# Patient Record
Sex: Male | Born: 1955
Health system: Southern US, Community
[De-identification: ages and names within clinical notes are randomized; demographics above are authoritative.]

## PROBLEM LIST (undated history)

## (undated) DIAGNOSIS — G43909 Migraine, unspecified, not intractable, without status migrainosus: Secondary | ICD-10-CM

## (undated) DIAGNOSIS — R569 Unspecified convulsions: Secondary | ICD-10-CM

## (undated) DIAGNOSIS — F1910 Other psychoactive substance abuse, uncomplicated: Secondary | ICD-10-CM

## (undated) DIAGNOSIS — I10 Essential (primary) hypertension: Secondary | ICD-10-CM

## (undated) DIAGNOSIS — F419 Anxiety disorder, unspecified: Secondary | ICD-10-CM

## (undated) DIAGNOSIS — F3162 Bipolar disorder, current episode mixed, moderate: Secondary | ICD-10-CM

## (undated) DIAGNOSIS — F410 Panic disorder [episodic paroxysmal anxiety] without agoraphobia: Secondary | ICD-10-CM

## (undated) DIAGNOSIS — G4733 Obstructive sleep apnea (adult) (pediatric): Secondary | ICD-10-CM

## (undated) DIAGNOSIS — K219 Gastro-esophageal reflux disease without esophagitis: Secondary | ICD-10-CM

## (undated) HISTORY — DX: Other psychoactive substance abuse, uncomplicated: F19.10

## (undated) HISTORY — PX: CARPOMETACARPEL (CMC) FUSION OF THUMB WITH AUTOGRAFT FROM RADIUS: SHX5767

## (undated) HISTORY — PX: WISDOM TOOTH EXTRACTION: SHX21

## (undated) HISTORY — DX: Panic disorder (episodic paroxysmal anxiety): F41.0

## (undated) HISTORY — DX: Anxiety disorder, unspecified: F41.9

## (undated) HISTORY — DX: Gastro-esophageal reflux disease without esophagitis: K21.9

## (undated) HISTORY — PX: TONSILLECTOMY: SUR1361

## (undated) HISTORY — DX: Migraine, unspecified, not intractable, without status migrainosus: G43.909

## (undated) HISTORY — DX: Unspecified convulsions: R56.9

## (undated) HISTORY — DX: Obstructive sleep apnea (adult) (pediatric): G47.33

## (undated) HISTORY — DX: Bipolar disorder, current episode mixed, moderate: F31.62

## (undated) HISTORY — DX: Essential (primary) hypertension: I10

---

## 1990-05-01 HISTORY — PX: HEMORRHOID SURGERY: SHX153

## 1998-05-01 HISTORY — PX: HEMORRHOID SURGERY: SHX153

## 2002-04-23 ENCOUNTER — Emergency Department (HOSPITAL_COMMUNITY): Admission: EM | Admit: 2002-04-23 | Discharge: 2002-04-23 | Payer: Self-pay | Admitting: Emergency Medicine

## 2003-06-19 ENCOUNTER — Emergency Department (HOSPITAL_COMMUNITY): Admission: EM | Admit: 2003-06-19 | Discharge: 2003-06-19 | Payer: Self-pay | Admitting: Family Medicine

## 2005-02-28 ENCOUNTER — Ambulatory Visit: Payer: Self-pay | Admitting: Internal Medicine

## 2005-03-30 ENCOUNTER — Ambulatory Visit: Payer: Self-pay | Admitting: Cardiology

## 2005-03-30 IMAGING — CT CT CHEST W/O CM
2 of 5 series · 12 of 36 positions shown, 15 images · IV contrast (agent unspecified)
Comparison: Chest x-ray of [DATE] was reviewed showing an oval soft tissue opacity overlying the right upper heart border.

CLINICAL DATA: Right lower lobe opacity on chest x-ray.
 CHEST CT WITHOUT CONTRAST:
TECHNIQUE: Multidetector CT imaging of the chest was performed following the standard protocol without IV contrast.

[Series 2: chest_hi_res 5.0 b40f st · axial · 0.81mm/px · z∈[-272,-52]mm · 9 of 56 slices shown, 12 images]
[im 6/56  mediastinal]
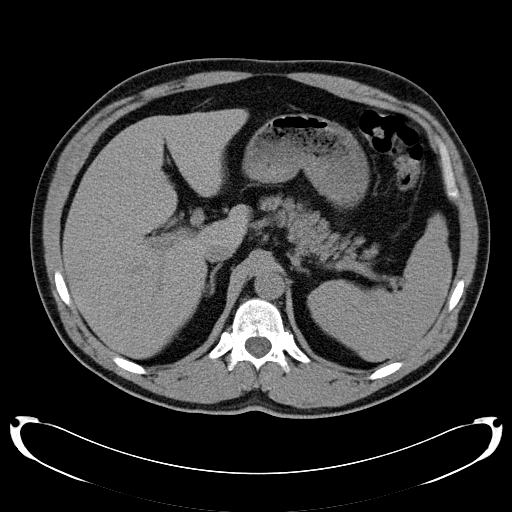
[im 6/56  lung]
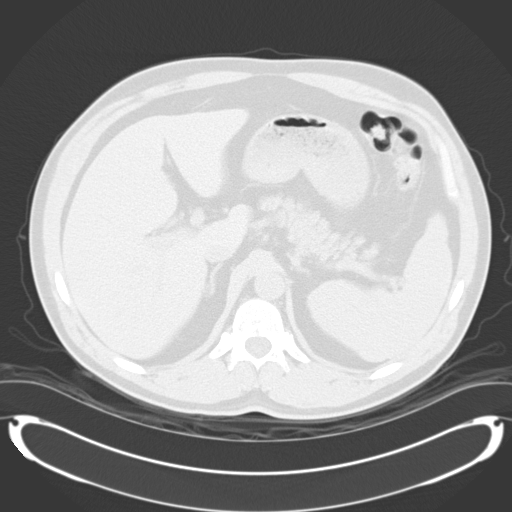
[im 12/56  lung]
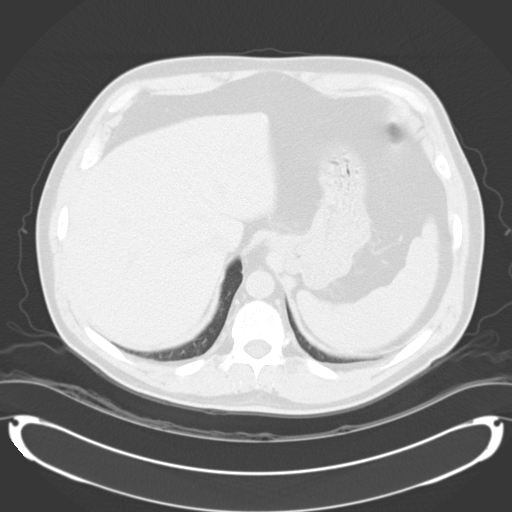
[im 17/56  lung]
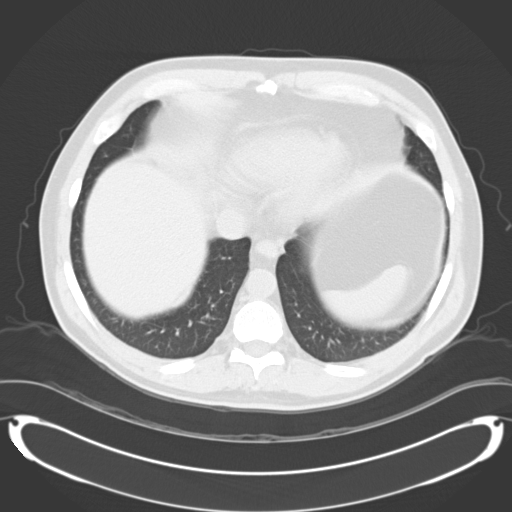
[im 23/56  lung]
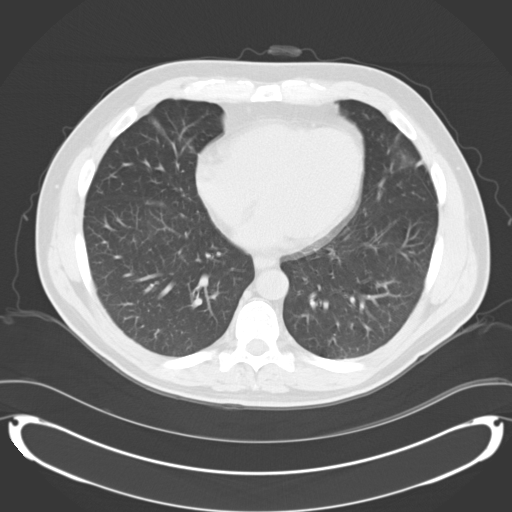
[im 28/56  mediastinal]
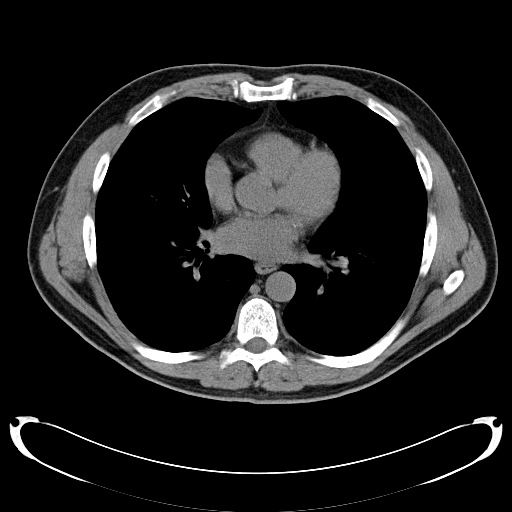
[im 28/56  lung]
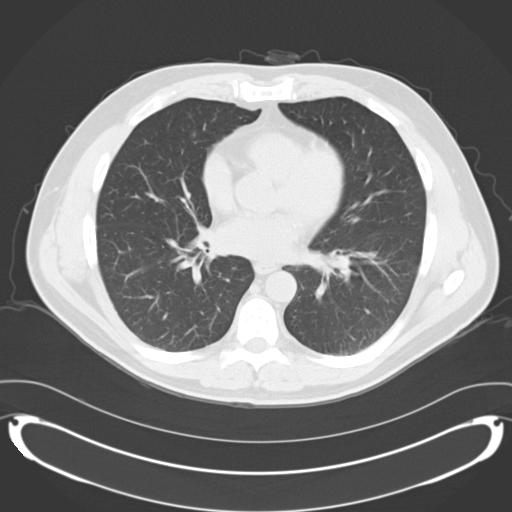
[im 34/56  lung]
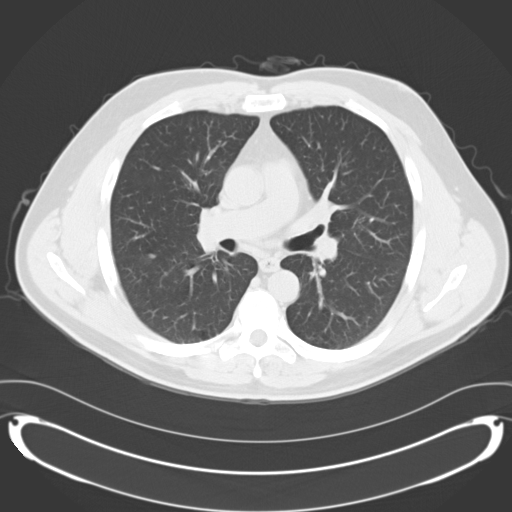
[im 39/56  lung]
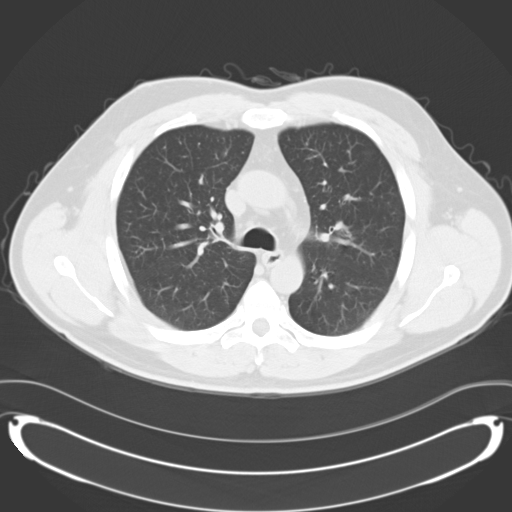
[im 45/56  lung]
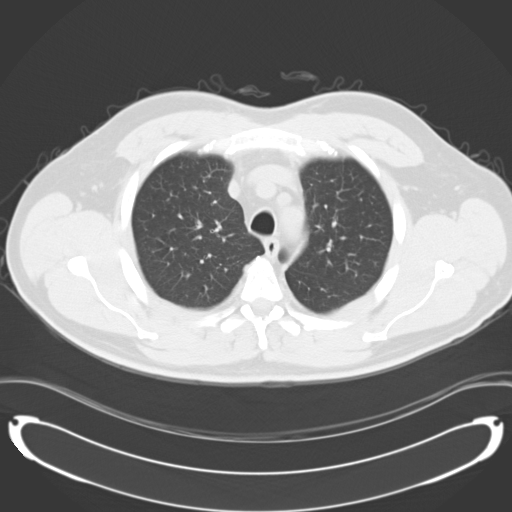
[im 50/56  mediastinal]
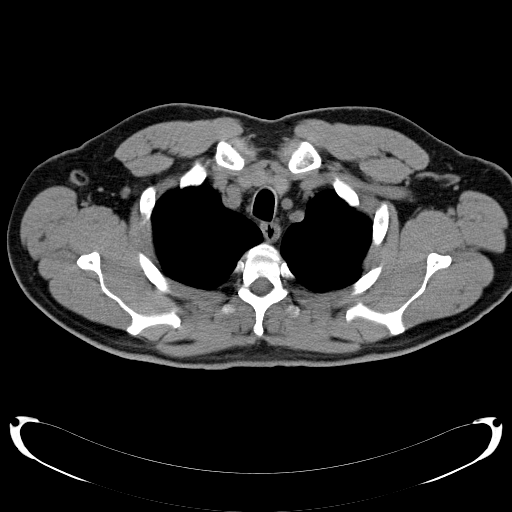
[im 50/56  lung]
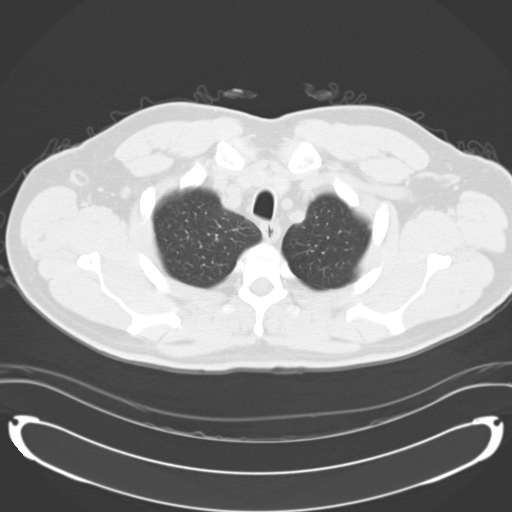

[Series 602: <mpr range> · coronal · 0.81mm/px · 3 of 39 slices shown]
[im 8/39  lung]
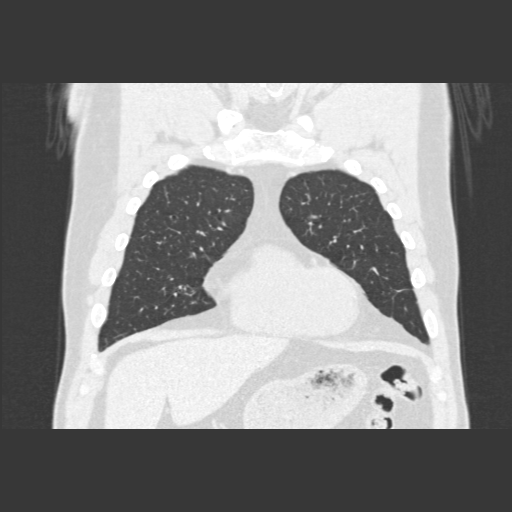
[im 16/39  lung]
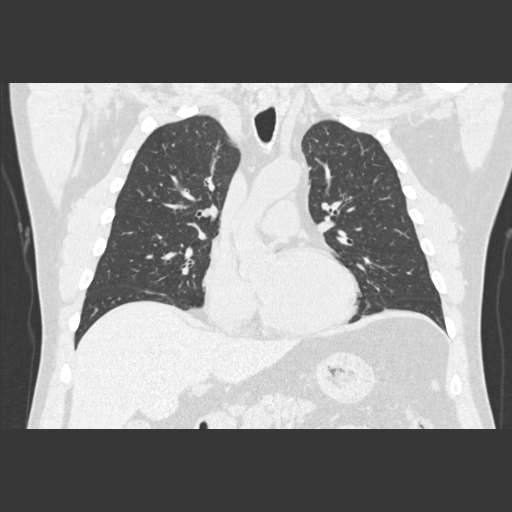
[im 23/39  lung]
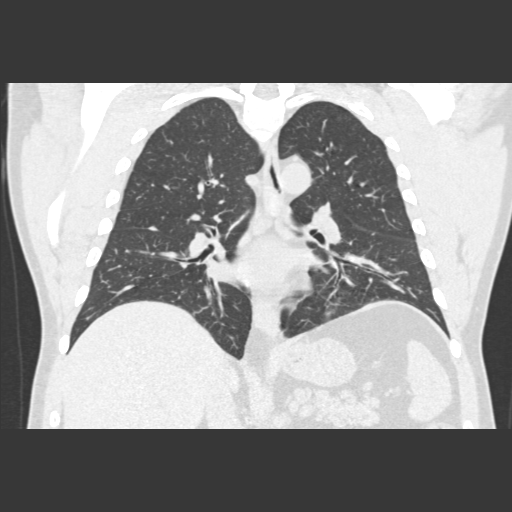

[12 of 36 positions shown; findings below may reference images not displayed]

FINDINGS: This opacity may represent a prominent right atrial appendage, but no pulmonary lesion is seen. A small nodular opacity is noted adjacent to the fissure on the right mid lung on image #23, most likely benign.  Limited CT follow-up in six months may be helpful to assess further.  Minimal scarring is noted in the right middle lobe and within the lingula.  No pleural effusion is seen.  No mediastinal or hilar adenopathy is seen.  A few coronary artery calcifications noted.
IMPRESSION: 1.  The area in question probably represents right atrial appendage.  No pulmonary lesion is seen.
 2.  Faint coronary artery calcifications.

## 2005-04-06 ENCOUNTER — Ambulatory Visit: Payer: Self-pay | Admitting: Internal Medicine

## 2005-04-14 ENCOUNTER — Ambulatory Visit: Payer: Self-pay

## 2005-08-11 ENCOUNTER — Ambulatory Visit: Payer: Self-pay | Admitting: Internal Medicine

## 2006-01-08 ENCOUNTER — Ambulatory Visit: Payer: Self-pay | Admitting: Internal Medicine

## 2006-02-19 ENCOUNTER — Ambulatory Visit: Payer: Self-pay | Admitting: Internal Medicine

## 2006-02-19 LAB — CONVERTED CEMR LAB
Magnesium: 2 mg/dL (ref 1.5–2.5)
Potassium: 4.6 meq/L (ref 3.5–5.1)
Sodium: 140 meq/L (ref 135–145)

## 2006-03-30 ENCOUNTER — Ambulatory Visit: Payer: Self-pay | Admitting: Internal Medicine

## 2006-05-11 ENCOUNTER — Ambulatory Visit: Payer: Self-pay | Admitting: Internal Medicine

## 2006-05-11 LAB — CONVERTED CEMR LAB
ALT: 22 units/L (ref 0–40)
AST: 23 units/L (ref 0–37)
Albumin: 4.4 g/dL (ref 3.5–5.2)
Alkaline Phosphatase: 58 units/L (ref 39–117)
BUN: 24 mg/dL — ABNORMAL HIGH (ref 6–23)
CO2: 26 meq/L (ref 19–32)
Calcium: 9.6 mg/dL (ref 8.4–10.5)
Chloride: 109 meq/L (ref 96–112)
Creatinine, Ser: 1.5 mg/dL (ref 0.4–1.5)
GFR calc non Af Amer: 53 mL/min
Glomerular Filtration Rate, Af Am: 64 mL/min/{1.73_m2}
Glucose, Bld: 144 mg/dL — ABNORMAL HIGH (ref 70–99)
HCT: 49.2 % (ref 39.0–52.0)
Hemoglobin: 16.6 g/dL (ref 13.0–17.0)
MCHC: 33.8 g/dL (ref 30.0–36.0)
MCV: 91 fL (ref 78.0–100.0)
Platelets: 266 10*3/uL (ref 150–400)
Potassium: 3.6 meq/L (ref 3.5–5.1)
RBC: 5.41 M/uL (ref 4.22–5.81)
RDW: 12 % (ref 11.5–14.6)
Sodium: 144 meq/L (ref 135–145)
TSH: 1.07 microintl units/mL (ref 0.35–5.50)
Total Bilirubin: 1.1 mg/dL (ref 0.3–1.2)
Total Protein: 6.8 g/dL (ref 6.0–8.3)
WBC: 13.2 10*3/uL — ABNORMAL HIGH (ref 4.5–10.5)

## 2006-05-14 ENCOUNTER — Ambulatory Visit: Payer: Self-pay | Admitting: Internal Medicine

## 2006-11-22 ENCOUNTER — Ambulatory Visit: Payer: Self-pay | Admitting: Internal Medicine

## 2007-02-12 ENCOUNTER — Encounter: Payer: Self-pay | Admitting: Internal Medicine

## 2007-02-16 ENCOUNTER — Encounter: Payer: Self-pay | Admitting: Internal Medicine

## 2007-02-16 DIAGNOSIS — I10 Essential (primary) hypertension: Secondary | ICD-10-CM | POA: Insufficient documentation

## 2007-02-18 ENCOUNTER — Ambulatory Visit: Payer: Self-pay | Admitting: Internal Medicine

## 2007-02-18 DIAGNOSIS — J069 Acute upper respiratory infection, unspecified: Secondary | ICD-10-CM | POA: Insufficient documentation

## 2007-02-18 DIAGNOSIS — K5900 Constipation, unspecified: Secondary | ICD-10-CM | POA: Insufficient documentation

## 2007-02-19 ENCOUNTER — Encounter: Payer: Self-pay | Admitting: Internal Medicine

## 2007-02-21 ENCOUNTER — Encounter: Payer: Self-pay | Admitting: Internal Medicine

## 2007-02-21 ENCOUNTER — Ambulatory Visit: Payer: Self-pay | Admitting: Internal Medicine

## 2007-02-21 DIAGNOSIS — G43819 Other migraine, intractable, without status migrainosus: Secondary | ICD-10-CM

## 2007-02-21 DIAGNOSIS — R112 Nausea with vomiting, unspecified: Secondary | ICD-10-CM | POA: Insufficient documentation

## 2007-02-21 DIAGNOSIS — F319 Bipolar disorder, unspecified: Secondary | ICD-10-CM | POA: Insufficient documentation

## 2007-02-21 DIAGNOSIS — G43119 Migraine with aura, intractable, without status migrainosus: Secondary | ICD-10-CM | POA: Insufficient documentation

## 2007-03-26 ENCOUNTER — Encounter: Payer: Self-pay | Admitting: Internal Medicine

## 2007-04-21 ENCOUNTER — Telehealth: Payer: Self-pay | Admitting: Internal Medicine

## 2007-04-21 ENCOUNTER — Encounter: Payer: Self-pay | Admitting: Internal Medicine

## 2007-04-21 ENCOUNTER — Emergency Department (HOSPITAL_COMMUNITY): Admission: EM | Admit: 2007-04-21 | Discharge: 2007-04-21 | Payer: Self-pay | Admitting: Emergency Medicine

## 2007-04-21 IMAGING — CR DG CHEST 2V
2 series · 2 of 2 positions shown · non-contrast
Comparison: CT chest of [DATE].

CLINICAL DATA: Dizziness.  Hypotension.  Dyspnea. 
 CHEST ? 2 VIEW:

[w chest pa]
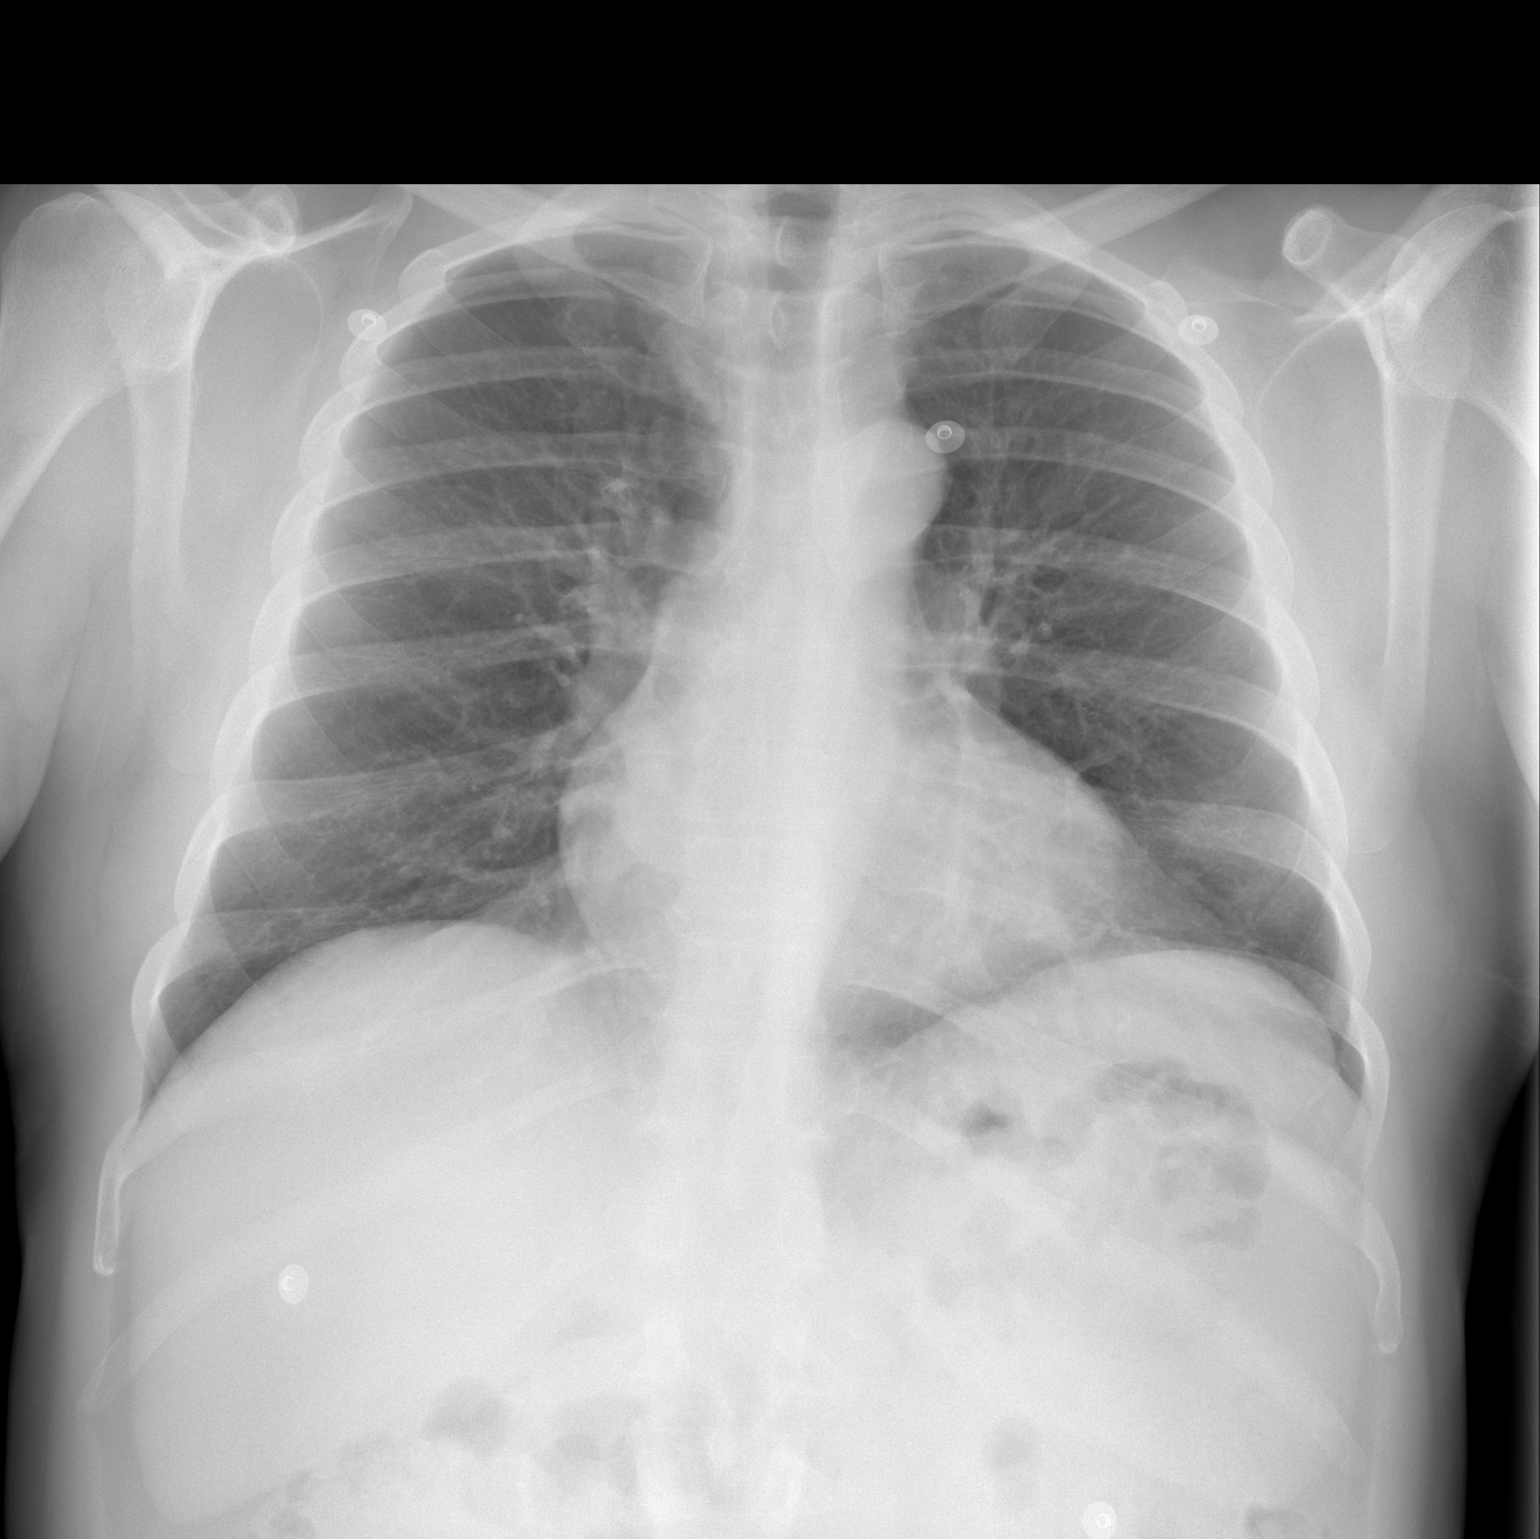

[w chest lat]
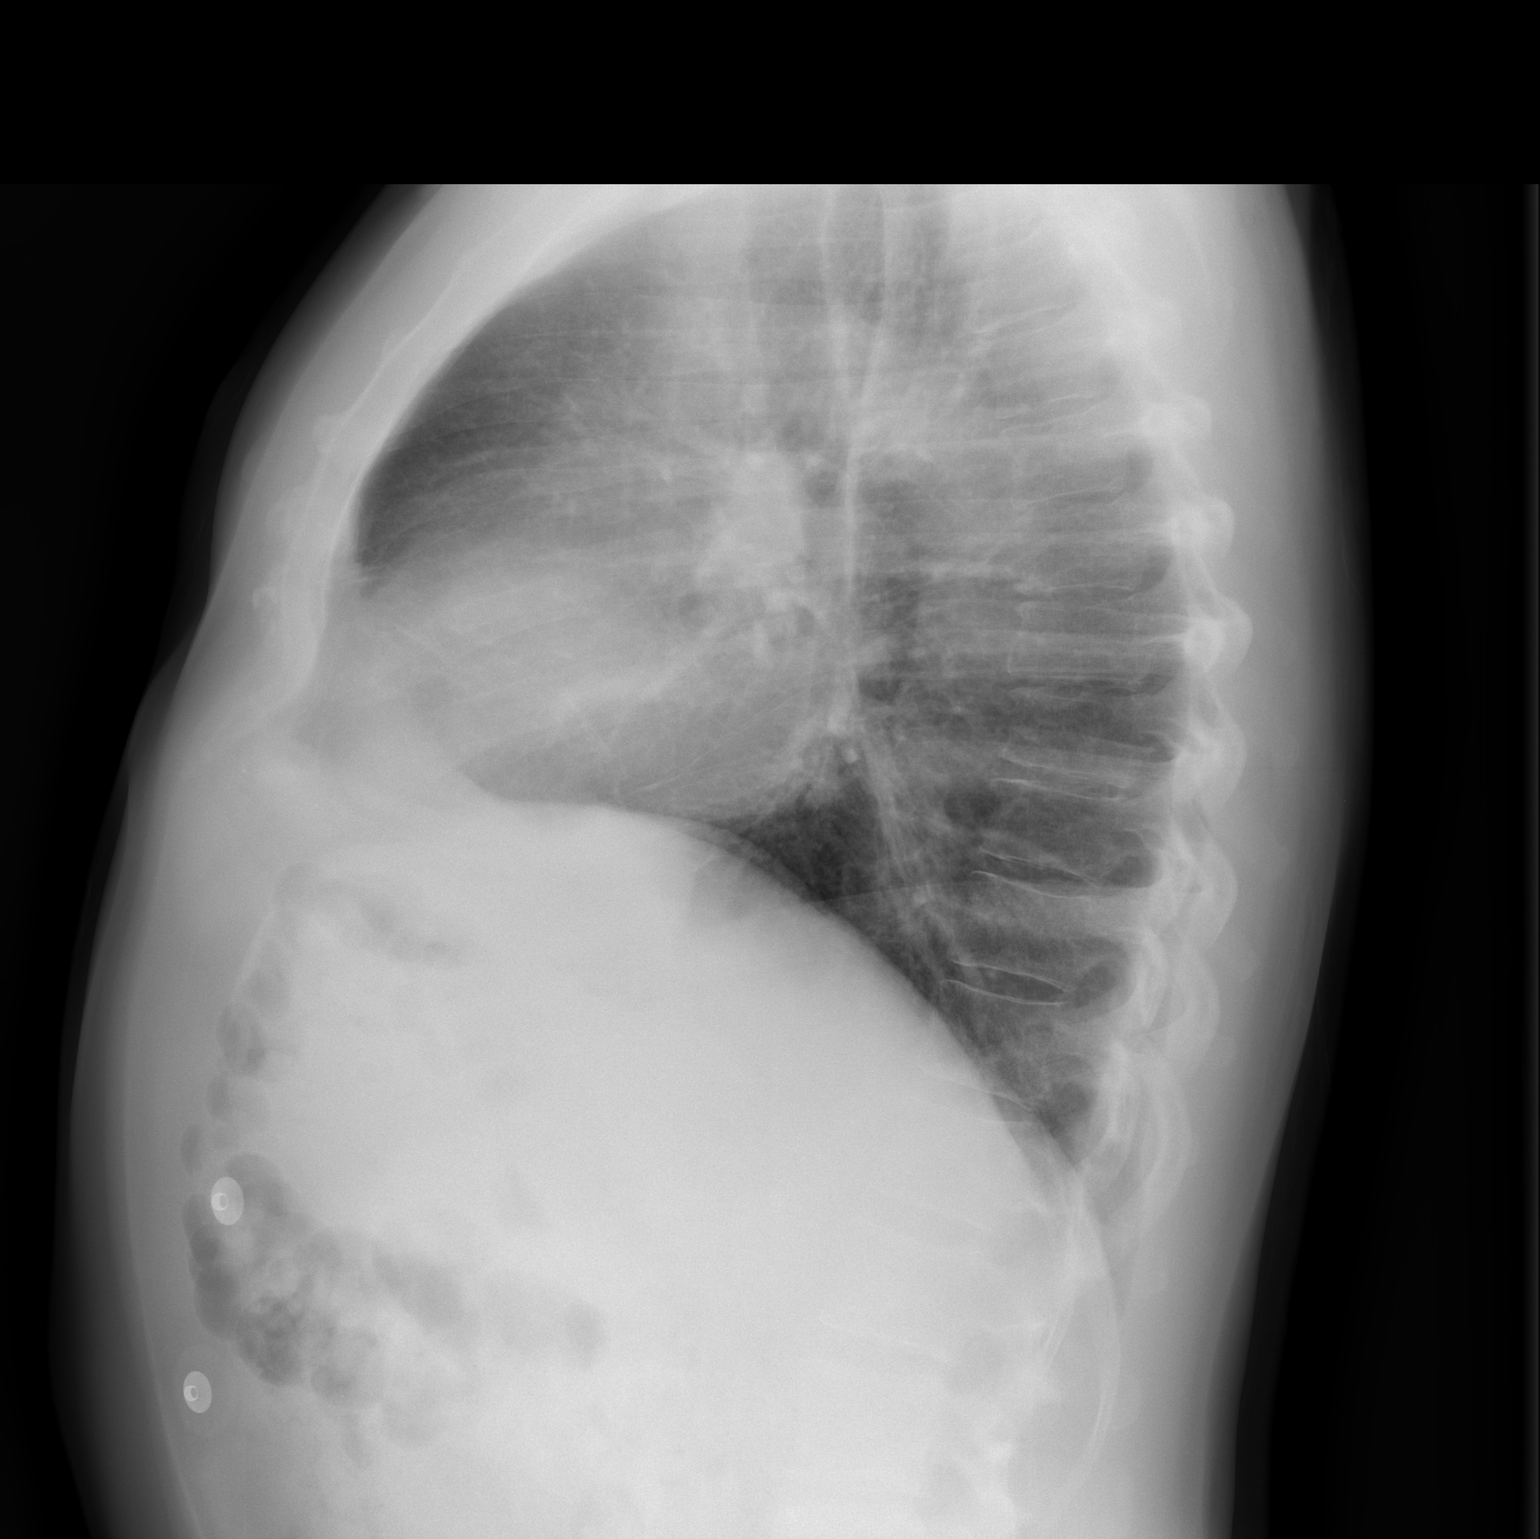

[2 of 2 positions shown; findings below may reference images not displayed]

FINDINGS: The lungs are clear.  The heart is normal.  The mediastinum and hilar are negative for adenopathy.  Mild degenerative changes of the thoracic spine.
IMPRESSION: Negative for acute cardiac or pulmonary process.

## 2007-04-22 ENCOUNTER — Ambulatory Visit: Payer: Self-pay | Admitting: Internal Medicine

## 2007-04-22 ENCOUNTER — Telehealth: Payer: Self-pay | Admitting: Internal Medicine

## 2007-04-22 DIAGNOSIS — R079 Chest pain, unspecified: Secondary | ICD-10-CM | POA: Insufficient documentation

## 2007-04-22 DIAGNOSIS — F411 Generalized anxiety disorder: Secondary | ICD-10-CM | POA: Insufficient documentation

## 2007-04-22 DIAGNOSIS — R42 Dizziness and giddiness: Secondary | ICD-10-CM | POA: Insufficient documentation

## 2007-04-22 DIAGNOSIS — F410 Panic disorder [episodic paroxysmal anxiety] without agoraphobia: Secondary | ICD-10-CM | POA: Insufficient documentation

## 2007-05-06 ENCOUNTER — Ambulatory Visit: Payer: Self-pay

## 2007-05-06 ENCOUNTER — Encounter: Payer: Self-pay | Admitting: Internal Medicine

## 2007-05-21 ENCOUNTER — Encounter: Payer: Self-pay | Admitting: Internal Medicine

## 2007-05-28 ENCOUNTER — Ambulatory Visit: Payer: Self-pay | Admitting: Internal Medicine

## 2007-05-28 LAB — CONVERTED CEMR LAB
ALT: 25 units/L (ref 0–53)
AST: 18 units/L (ref 0–37)
Albumin: 4.1 g/dL (ref 3.5–5.2)
Alkaline Phosphatase: 47 units/L (ref 39–117)
Bilirubin, Direct: 0.2 mg/dL (ref 0.0–0.3)
CO2: 28 meq/L (ref 19–32)
Chloride: 110 meq/L (ref 96–112)
Potassium: 4.8 meq/L (ref 3.5–5.1)
Sodium: 143 meq/L (ref 135–145)
Total Bilirubin: 1 mg/dL (ref 0.3–1.2)
Total Protein: 6.5 g/dL (ref 6.0–8.3)

## 2007-05-31 ENCOUNTER — Telehealth: Payer: Self-pay | Admitting: Internal Medicine

## 2007-05-31 LAB — CONVERTED CEMR LAB: Lithium Lvl: 0.6 meq/L — ABNORMAL LOW (ref 0.80–1.40)

## 2007-07-16 ENCOUNTER — Encounter: Payer: Self-pay | Admitting: Internal Medicine

## 2007-08-26 ENCOUNTER — Ambulatory Visit: Payer: Self-pay | Admitting: Internal Medicine

## 2007-11-19 ENCOUNTER — Encounter: Payer: Self-pay | Admitting: Internal Medicine

## 2007-11-25 ENCOUNTER — Ambulatory Visit: Payer: Self-pay | Admitting: Internal Medicine

## 2007-11-25 LAB — CONVERTED CEMR LAB
ALT: 14 units/L (ref 0–53)
AST: 17 units/L (ref 0–37)
Albumin: 4.3 g/dL (ref 3.5–5.2)
Alkaline Phosphatase: 50 units/L (ref 39–117)
BUN: 11 mg/dL (ref 6–23)
Basophils Absolute: 0.1 10*3/uL (ref 0.0–0.1)
Basophils Relative: 0.9 % (ref 0.0–3.0)
Bilirubin Urine: NEGATIVE
Bilirubin, Direct: 0.2 mg/dL (ref 0.0–0.3)
CO2: 26 meq/L (ref 19–32)
Calcium: 9.8 mg/dL (ref 8.4–10.5)
Chloride: 109 meq/L (ref 96–112)
Cholesterol: 107 mg/dL (ref 0–200)
Creatinine, Ser: 1.2 mg/dL (ref 0.4–1.5)
Eosinophils Absolute: 0.3 10*3/uL (ref 0.0–0.7)
Eosinophils Relative: 3.7 % (ref 0.0–5.0)
Free T4: 0.9 ng/dL (ref 0.6–1.6)
GFR calc Af Amer: 82 mL/min
GFR calc non Af Amer: 68 mL/min
Glucose, Bld: 96 mg/dL (ref 70–99)
HCT: 42.5 % (ref 39.0–52.0)
HDL: 26.6 mg/dL — ABNORMAL LOW (ref >39.0)
Hemoglobin, Urine: NEGATIVE
Hemoglobin: 14.5 g/dL (ref 13.0–17.0)
LDL Cholesterol: 61 mg/dL (ref 0–99)
Leukocytes, UA: NEGATIVE
Lymphocytes Relative: 22.5 % (ref 12.0–46.0)
MCHC: 34.2 g/dL (ref 30.0–36.0)
MCV: 92.6 fL (ref 78.0–100.0)
Monocytes Absolute: 0.7 10*3/uL (ref 0.1–1.0)
Monocytes Relative: 7.8 % (ref 3.0–12.0)
Neutro Abs: 5.6 10*3/uL (ref 1.4–7.7)
Neutrophils Relative %: 65.1 % (ref 43.0–77.0)
Nitrite: NEGATIVE
Platelets: 224 10*3/uL (ref 150–400)
Potassium: 3.6 meq/L (ref 3.5–5.1)
RBC: 4.59 M/uL (ref 4.22–5.81)
RDW: 12.5 % (ref 11.5–14.6)
Sodium: 142 meq/L (ref 135–145)
Specific Gravity, Urine: 1.02 (ref 1.000–1.03)
TSH: 1.74 microintl units/mL (ref 0.35–5.50)
Total Bilirubin: 0.9 mg/dL (ref 0.3–1.2)
Total CHOL/HDL Ratio: 4
Total Protein, Urine: NEGATIVE mg/dL
Total Protein: 6.6 g/dL (ref 6.0–8.3)
Triglycerides: 96 mg/dL (ref 0–149)
Urine Glucose: NEGATIVE mg/dL
Urobilinogen, UA: 1 (ref 0.0–1.0)
VLDL: 19 mg/dL (ref 0–40)
WBC: 8.6 10*3/uL (ref 4.5–10.5)
pH: 6.5 (ref 5.0–8.0)

## 2007-11-26 ENCOUNTER — Ambulatory Visit: Payer: Self-pay | Admitting: Internal Medicine

## 2007-12-02 LAB — CONVERTED CEMR LAB
Amphetamine Screen, Ur: NEGATIVE
Barbiturate Quant, Ur: NEGATIVE
Benzodiazepines.: NEGATIVE
Cocaine Metabolites: POSITIVE — AB
Creatinine,U: 188.1 mg/dL
Lithium Lvl: 1.42 meq/L — ABNORMAL HIGH (ref 0.80–1.40)
Marijuana Metabolite: POSITIVE — AB
Methadone: NEGATIVE
Opiate Screen, Urine: NEGATIVE
Phencyclidine (PCP): NEGATIVE
Propoxyphene: NEGATIVE
Valproic Acid Lvl: 78.5 ug/mL (ref 50.0–100.0)

## 2008-01-29 ENCOUNTER — Ambulatory Visit: Payer: Self-pay | Admitting: Internal Medicine

## 2008-01-29 LAB — CONVERTED CEMR LAB
ALT: 20 units/L (ref 0–53)
AST: 17 units/L (ref 0–37)
Albumin: 4 g/dL (ref 3.5–5.2)
Alkaline Phosphatase: 46 units/L (ref 39–117)
BUN: 10 mg/dL (ref 6–23)
Bilirubin, Direct: 0.2 mg/dL (ref 0.0–0.3)
CO2: 27 meq/L (ref 19–32)
Calcium: 9.2 mg/dL (ref 8.4–10.5)
Chloride: 106 meq/L (ref 96–112)
Creatinine, Ser: 1.3 mg/dL (ref 0.4–1.5)
GFR calc Af Amer: 75 mL/min
GFR calc non Af Amer: 62 mL/min
Glucose, Bld: 89 mg/dL (ref 70–99)
Lipase: 25 units/L (ref 11.0–59.0)
Lithium Lvl: 0.59 meq/L — ABNORMAL LOW (ref 0.80–1.40)
Potassium: 4.5 meq/L (ref 3.5–5.1)
Sodium: 141 meq/L (ref 135–145)
Total Bilirubin: 0.8 mg/dL (ref 0.3–1.2)
Total Protein: 6.2 g/dL (ref 6.0–8.3)

## 2008-01-31 ENCOUNTER — Ambulatory Visit: Payer: Self-pay | Admitting: Internal Medicine

## 2008-01-31 DIAGNOSIS — R109 Unspecified abdominal pain: Secondary | ICD-10-CM | POA: Insufficient documentation

## 2008-02-13 ENCOUNTER — Encounter: Payer: Self-pay | Admitting: Internal Medicine

## 2008-03-10 ENCOUNTER — Encounter: Payer: Self-pay | Admitting: Internal Medicine

## 2008-04-14 ENCOUNTER — Encounter: Payer: Self-pay | Admitting: Internal Medicine

## 2008-05-12 ENCOUNTER — Ambulatory Visit: Payer: Self-pay | Admitting: Internal Medicine

## 2008-05-19 ENCOUNTER — Encounter: Admission: RE | Admit: 2008-05-19 | Discharge: 2008-05-19 | Payer: Self-pay | Admitting: Internal Medicine

## 2008-05-19 IMAGING — US US ABDOMEN COMPLETE
1 series · 14 of 25 positions shown · non-contrast
Comparison: No prior studies.

CLINICAL DATA: Right upper quadrant abdominal pain.

ABDOMEN ULTRASOUND
TECHNIQUE: Complete abdominal ultrasound examination was performed
including evaluation of the liver, gallbladder, bile ducts,
pancreas, kidneys, spleen, IVC, and abdominal aorta.

[Series 1: us abdomen complete · 0.35mm/px · 14 of 49 slices shown]
[im 1/49]
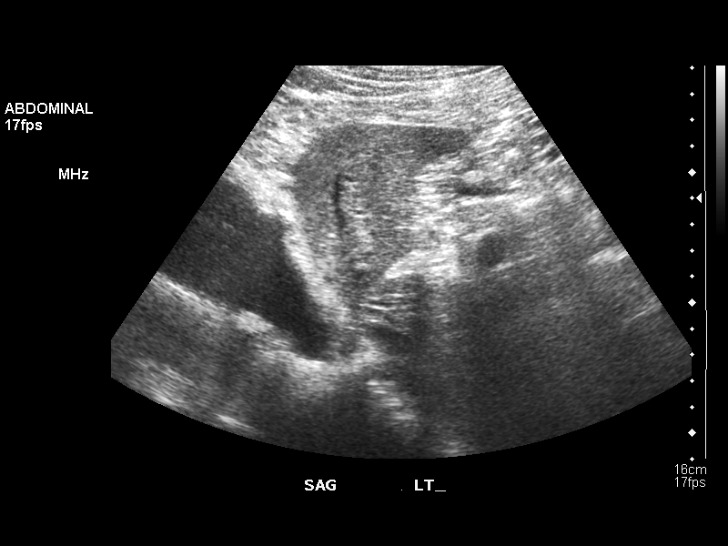
[im 5/49]
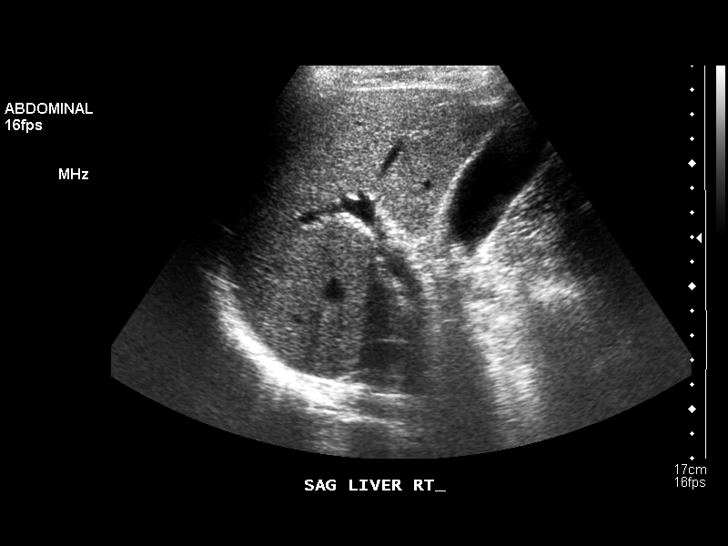
[im 9/49]
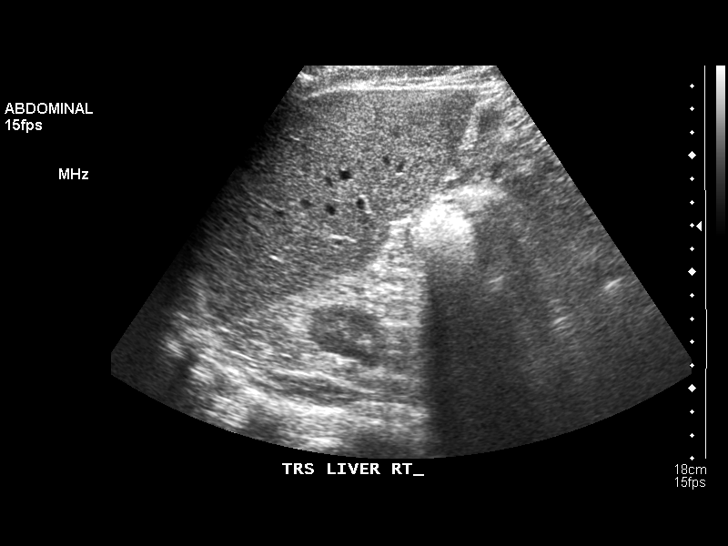
[im 13/49]
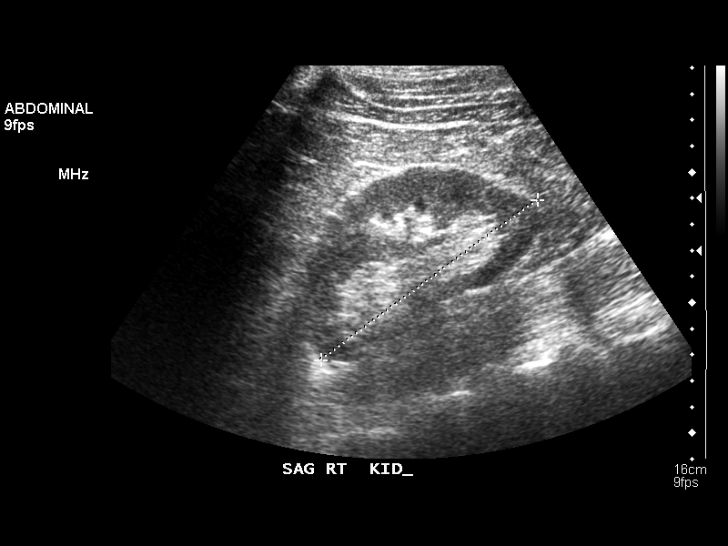
[im 17/49]
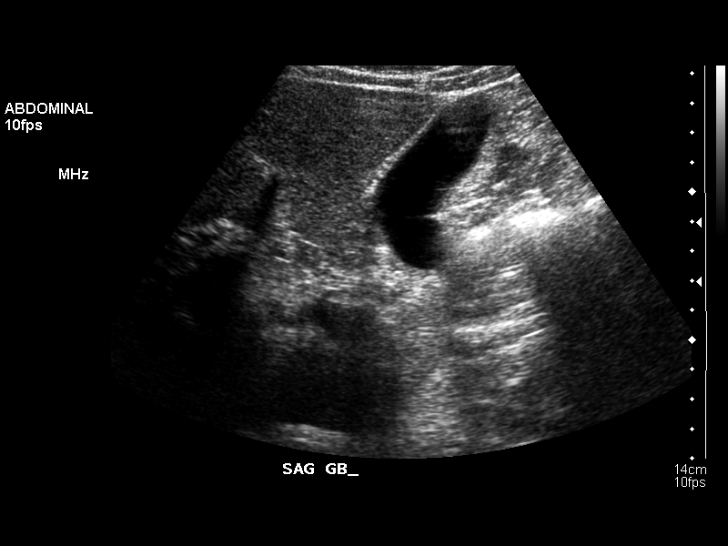
[im 19/49]
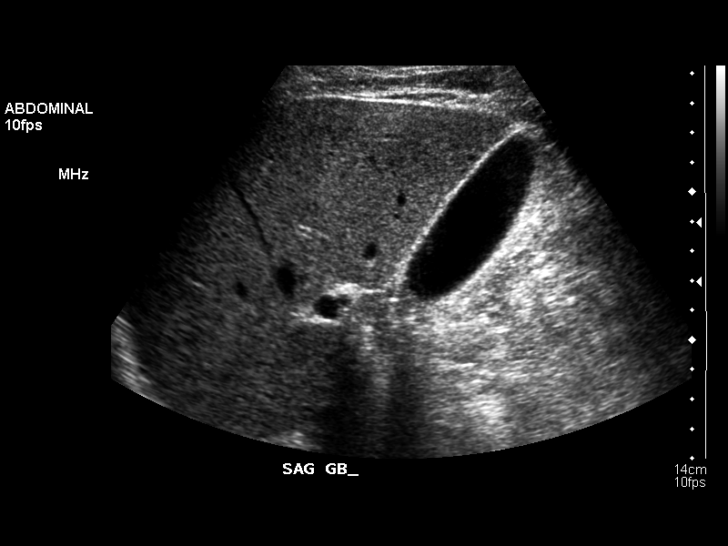
[im 23/49]
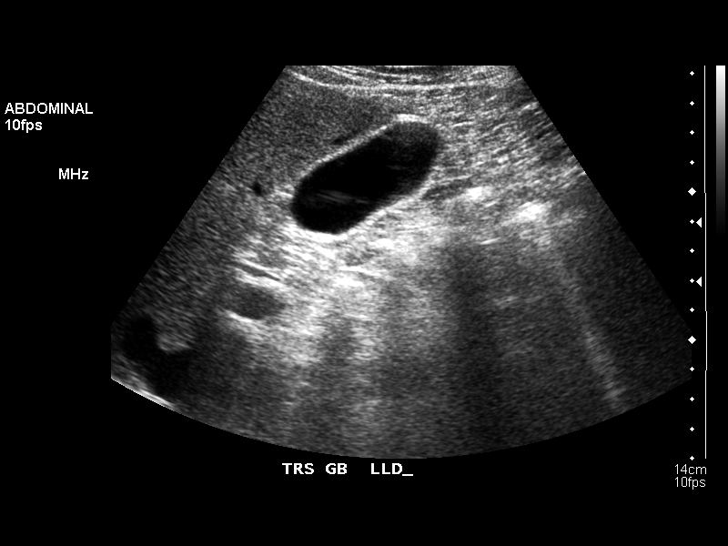
[im 27/49]
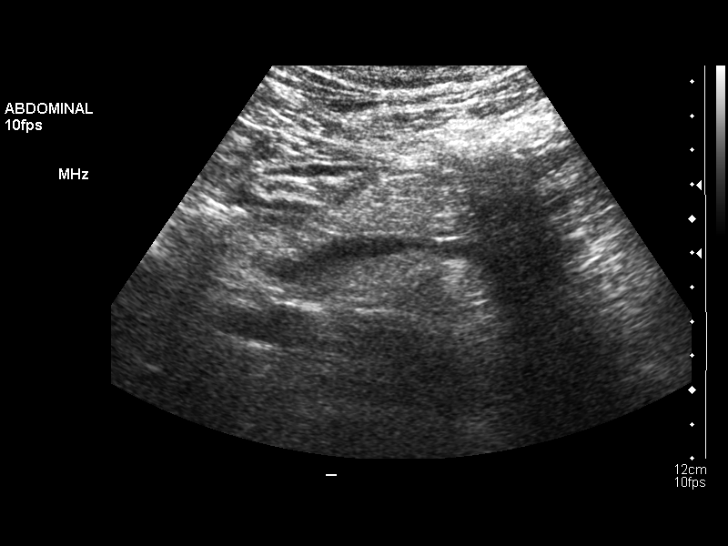
[im 31/49]
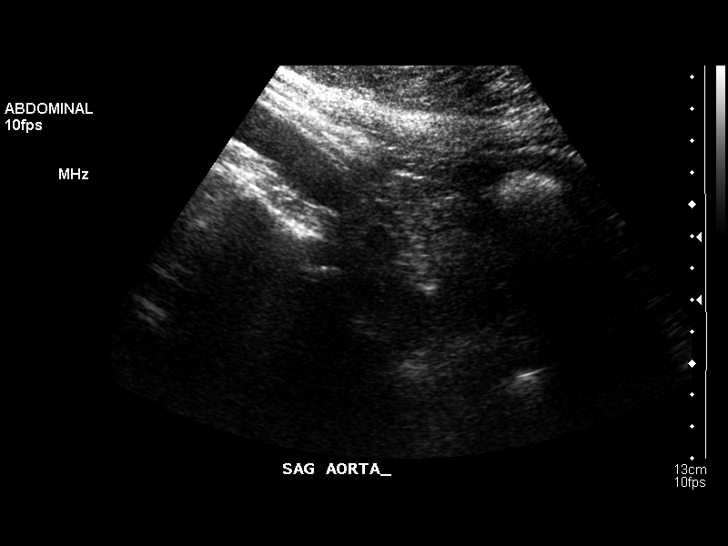
[im 33/49]
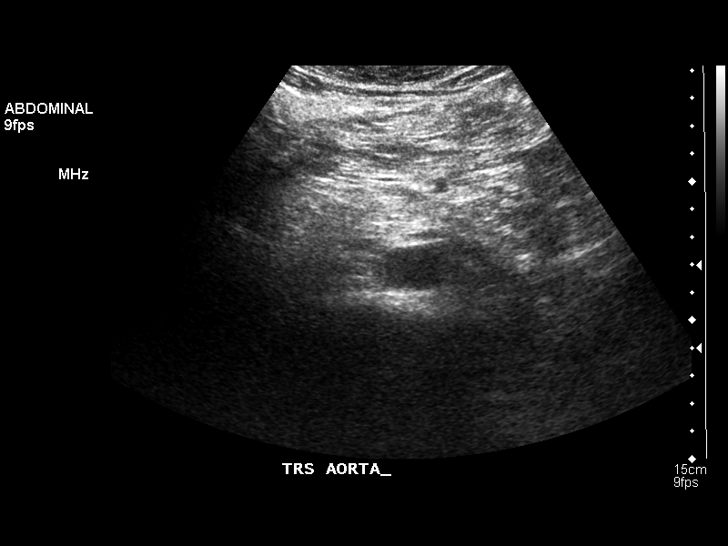
[im 37/49]
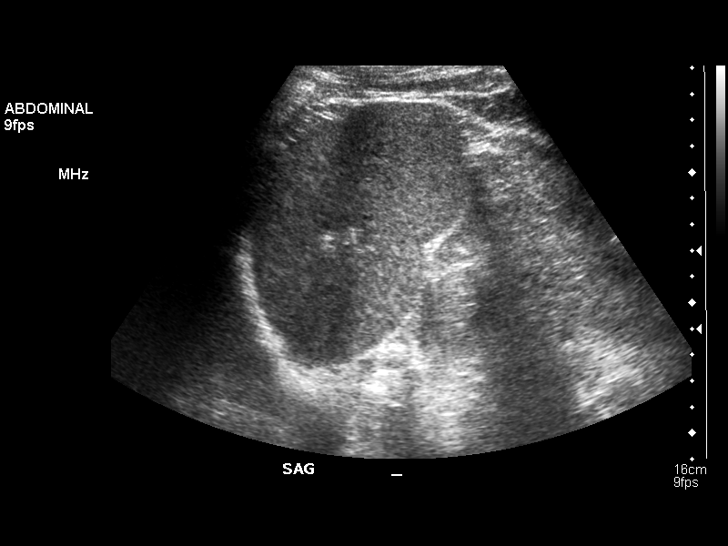
[im 41/49]
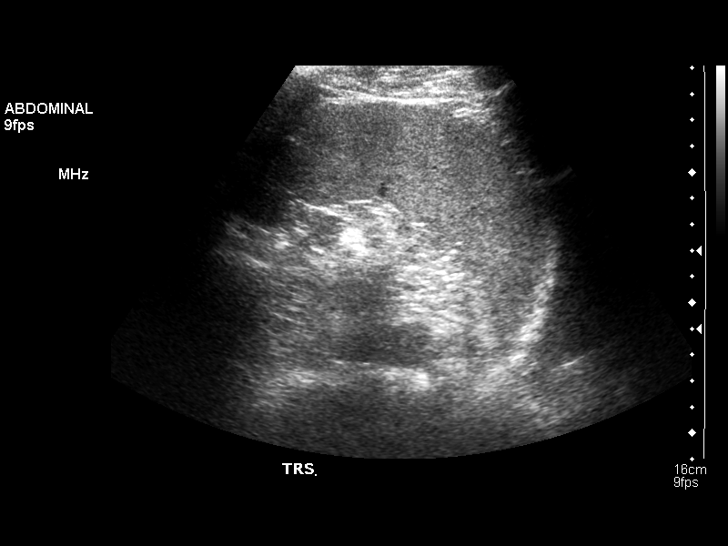
[im 45/49]
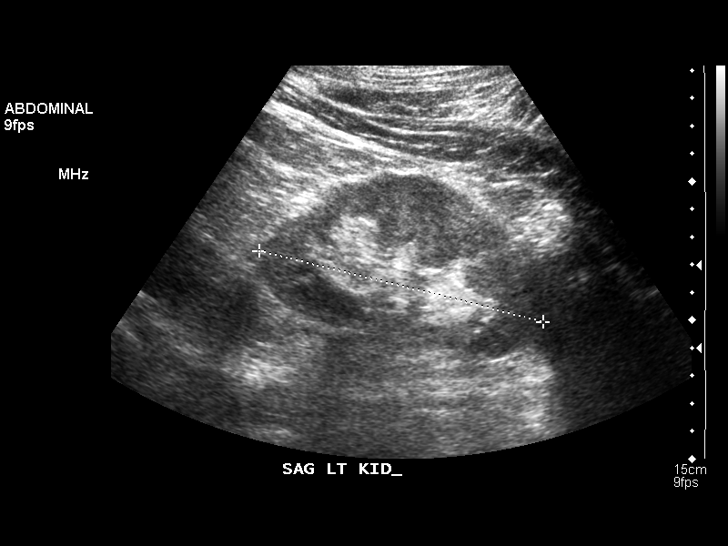
[im 49/49]
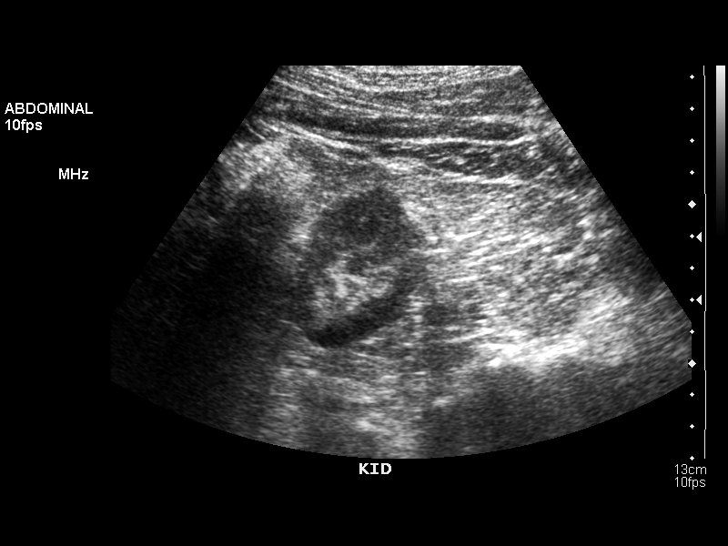

[14 of 25 positions shown; findings below may reference images not displayed]

FINDINGS: The gallbladder is visualized and has a normal
appearance.  The common bile duct is normal in caliber measuring 4
mm in diameter.  The liver, inferior vena cava, pancreas, spleen,
kidneys, and abdominal aorta are visualized and have a normal
appearance.  The right kidney measures 10.3 cm in length and the
left kidney 10.6 cm in length.
IMPRESSION: Normal study.

## 2008-06-09 ENCOUNTER — Encounter: Payer: Self-pay | Admitting: Internal Medicine

## 2008-07-27 ENCOUNTER — Ambulatory Visit: Payer: Self-pay | Admitting: Internal Medicine

## 2008-07-27 LAB — CONVERTED CEMR LAB
ALT: 15 units/L (ref 0–53)
AST: 17 units/L (ref 0–37)
Albumin: 4.2 g/dL (ref 3.5–5.2)
Alkaline Phosphatase: 56 units/L (ref 39–117)
BUN: 13 mg/dL (ref 6–23)
Basophils Absolute: 0.1 10*3/uL (ref 0.0–0.1)
Basophils Relative: 1.1 % (ref 0.0–3.0)
Bilirubin Urine: NEGATIVE
Bilirubin, Direct: 0.2 mg/dL (ref 0.0–0.3)
CO2: 31 meq/L (ref 19–32)
Calcium: 9.4 mg/dL (ref 8.4–10.5)
Chloride: 111 meq/L (ref 96–112)
Creatinine, Ser: 1.2 mg/dL (ref 0.4–1.5)
Eosinophils Absolute: 0.6 10*3/uL (ref 0.0–0.7)
Eosinophils Relative: 7.2 % — ABNORMAL HIGH (ref 0.0–5.0)
GFR calc non Af Amer: 67.44 mL/min (ref >60)
Glucose, Bld: 104 mg/dL — ABNORMAL HIGH (ref 70–99)
HCT: 45.4 % (ref 39.0–52.0)
Hemoglobin, Urine: NEGATIVE
Hemoglobin: 15.8 g/dL (ref 13.0–17.0)
Ketones, ur: NEGATIVE mg/dL
Leukocytes, UA: NEGATIVE
Lipase: 45 units/L (ref 11.0–59.0)
Lymphocytes Relative: 21.8 % (ref 12.0–46.0)
Lymphs Abs: 2 10*3/uL (ref 0.7–4.0)
MCHC: 34.8 g/dL (ref 30.0–36.0)
MCV: 90.1 fL (ref 78.0–100.0)
Monocytes Absolute: 0.7 10*3/uL (ref 0.1–1.0)
Monocytes Relative: 7.8 % (ref 3.0–12.0)
Neutro Abs: 5.6 10*3/uL (ref 1.4–7.7)
Neutrophils Relative %: 62.1 % (ref 43.0–77.0)
Nitrite: NEGATIVE
Platelets: 187 10*3/uL (ref 150.0–400.0)
Potassium: 4.8 meq/L (ref 3.5–5.1)
RBC: 5.04 M/uL (ref 4.22–5.81)
RDW: 12.3 % (ref 11.5–14.6)
Sodium: 145 meq/L (ref 135–145)
Specific Gravity, Urine: 1.02 (ref 1.000–1.030)
Total Bilirubin: 1 mg/dL (ref 0.3–1.2)
Total Protein: 6.5 g/dL (ref 6.0–8.3)
Urine Glucose: NEGATIVE mg/dL
Urobilinogen, UA: 0.2 (ref 0.0–1.0)
WBC: 9 10*3/uL (ref 4.5–10.5)
pH: 6.5 (ref 5.0–8.0)

## 2008-08-18 ENCOUNTER — Ambulatory Visit: Payer: Self-pay | Admitting: Internal Medicine

## 2008-09-22 ENCOUNTER — Encounter: Payer: Self-pay | Admitting: Internal Medicine

## 2008-11-17 ENCOUNTER — Encounter: Payer: Self-pay | Admitting: Internal Medicine

## 2008-12-17 ENCOUNTER — Ambulatory Visit: Payer: Self-pay | Admitting: Internal Medicine

## 2008-12-24 ENCOUNTER — Ambulatory Visit: Payer: Self-pay | Admitting: Internal Medicine

## 2008-12-24 DIAGNOSIS — R209 Unspecified disturbances of skin sensation: Secondary | ICD-10-CM | POA: Insufficient documentation

## 2008-12-24 DIAGNOSIS — M79609 Pain in unspecified limb: Secondary | ICD-10-CM | POA: Insufficient documentation

## 2009-01-12 ENCOUNTER — Encounter: Payer: Self-pay | Admitting: Internal Medicine

## 2009-03-15 ENCOUNTER — Encounter: Payer: Self-pay | Admitting: Internal Medicine

## 2009-05-01 HISTORY — PX: COLONOSCOPY: SHX174

## 2009-06-03 ENCOUNTER — Ambulatory Visit: Payer: Self-pay | Admitting: Internal Medicine

## 2009-06-03 LAB — CONVERTED CEMR LAB
ALT: 21 units/L (ref 0–53)
AST: 15 units/L (ref 0–37)
Albumin: 4.5 g/dL (ref 3.5–5.2)
Alkaline Phosphatase: 54 units/L (ref 39–117)
BUN: 15 mg/dL (ref 6–23)
Basophils Absolute: 0.1 10*3/uL (ref 0.0–0.1)
Basophils Relative: 0.6 % (ref 0.0–3.0)
Bilirubin Urine: NEGATIVE
Bilirubin, Direct: 0.2 mg/dL (ref 0.0–0.3)
CO2: 29 meq/L (ref 19–32)
Calcium: 9.5 mg/dL (ref 8.4–10.5)
Chloride: 105 meq/L (ref 96–112)
Cholesterol: 136 mg/dL (ref 0–200)
Creatinine, Ser: 1.4 mg/dL (ref 0.4–1.5)
Eosinophils Absolute: 0.5 10*3/uL (ref 0.0–0.7)
Eosinophils Relative: 4.5 % (ref 0.0–5.0)
GFR calc non Af Amer: 56.27 mL/min (ref >60)
Glucose, Bld: 90 mg/dL (ref 70–99)
HCT: 45.3 % (ref 39.0–52.0)
HDL: 38.4 mg/dL — ABNORMAL LOW (ref >39.00)
Hemoglobin, Urine: NEGATIVE
Hemoglobin: 14.9 g/dL (ref 13.0–17.0)
Ketones, ur: NEGATIVE mg/dL
LDL Cholesterol: 76 mg/dL (ref 0–99)
Leukocytes, UA: NEGATIVE
Lymphocytes Relative: 20.1 % (ref 12.0–46.0)
Lymphs Abs: 2 10*3/uL (ref 0.7–4.0)
MCHC: 32.9 g/dL (ref 30.0–36.0)
MCV: 93.3 fL (ref 78.0–100.0)
Monocytes Absolute: 0.7 10*3/uL (ref 0.1–1.0)
Monocytes Relative: 7.1 % (ref 3.0–12.0)
Neutro Abs: 6.7 10*3/uL (ref 1.4–7.7)
Neutrophils Relative %: 67.7 % (ref 43.0–77.0)
Nitrite: NEGATIVE
PSA: 1.69 ng/mL (ref 0.10–4.00)
Platelets: 223 10*3/uL (ref 150.0–400.0)
Potassium: 4 meq/L (ref 3.5–5.1)
RBC: 4.85 M/uL (ref 4.22–5.81)
RDW: 12.4 % (ref 11.5–14.6)
Sodium: 141 meq/L (ref 135–145)
Specific Gravity, Urine: 1.02 (ref 1.000–1.030)
TSH: 3.53 microintl units/mL (ref 0.35–5.50)
Total Bilirubin: 1.2 mg/dL (ref 0.3–1.2)
Total CHOL/HDL Ratio: 4
Total Protein: 6.8 g/dL (ref 6.0–8.3)
Triglycerides: 109 mg/dL (ref 0.0–149.0)
Urine Glucose: NEGATIVE mg/dL
Urobilinogen, UA: 0.2 (ref 0.0–1.0)
VLDL: 21.8 mg/dL (ref 0.0–40.0)
WBC: 10 10*3/uL (ref 4.5–10.5)
pH: 6.5 (ref 5.0–8.0)

## 2009-06-08 ENCOUNTER — Ambulatory Visit: Payer: Self-pay | Admitting: Internal Medicine

## 2009-06-08 DIAGNOSIS — K921 Melena: Secondary | ICD-10-CM | POA: Insufficient documentation

## 2009-06-09 ENCOUNTER — Encounter (INDEPENDENT_AMBULATORY_CARE_PROVIDER_SITE_OTHER): Payer: Self-pay | Admitting: *Deleted

## 2009-06-09 ENCOUNTER — Inpatient Hospital Stay (HOSPITAL_COMMUNITY): Admission: AD | Admit: 2009-06-09 | Discharge: 2009-06-10 | Payer: Self-pay | Admitting: Internal Medicine

## 2009-06-09 ENCOUNTER — Telehealth (INDEPENDENT_AMBULATORY_CARE_PROVIDER_SITE_OTHER): Payer: Self-pay | Admitting: *Deleted

## 2009-06-09 ENCOUNTER — Ambulatory Visit: Payer: Self-pay | Admitting: Internal Medicine

## 2009-06-09 ENCOUNTER — Telehealth: Payer: Self-pay | Admitting: Internal Medicine

## 2009-06-10 ENCOUNTER — Encounter: Payer: Self-pay | Admitting: Gastroenterology

## 2009-06-10 LAB — HM COLONOSCOPY

## 2009-06-22 ENCOUNTER — Encounter: Payer: Self-pay | Admitting: Internal Medicine

## 2009-09-01 ENCOUNTER — Ambulatory Visit: Payer: Self-pay | Admitting: Internal Medicine

## 2009-09-01 DIAGNOSIS — K219 Gastro-esophageal reflux disease without esophagitis: Secondary | ICD-10-CM | POA: Insufficient documentation

## 2009-09-06 ENCOUNTER — Encounter: Payer: Self-pay | Admitting: Internal Medicine

## 2009-10-25 ENCOUNTER — Encounter: Payer: Self-pay | Admitting: Internal Medicine

## 2009-11-03 ENCOUNTER — Telehealth: Payer: Self-pay | Admitting: Internal Medicine

## 2009-11-04 ENCOUNTER — Ambulatory Visit: Payer: Self-pay | Admitting: Internal Medicine

## 2009-11-04 LAB — CONVERTED CEMR LAB
ALT: 14 units/L (ref 0–53)
AST: 14 units/L (ref 0–37)
Albumin: 4.3 g/dL (ref 3.5–5.2)
Alkaline Phosphatase: 44 units/L (ref 39–117)
BUN: 17 mg/dL (ref 6–23)
Basophils Absolute: 0.1 10*3/uL (ref 0.0–0.1)
Basophils Relative: 0.7 % (ref 0.0–3.0)
Bilirubin Urine: NEGATIVE
CO2: 26 meq/L (ref 19–32)
Calcium: 9.6 mg/dL (ref 8.4–10.5)
Chloride: 108 meq/L (ref 96–112)
Cholesterol: 121 mg/dL (ref 0–200)
Creatinine, Ser: 1.4 mg/dL (ref 0.4–1.5)
Eosinophils Absolute: 0.5 10*3/uL (ref 0.0–0.7)
Eosinophils Relative: 5.5 % — ABNORMAL HIGH (ref 0.0–5.0)
Free T4: 0.99 ng/dL (ref 0.60–1.60)
GFR calc non Af Amer: 56.64 mL/min (ref >60)
Glucose, Bld: 85 mg/dL (ref 70–99)
HCT: 44 % (ref 39.0–52.0)
HDL: 29.3 mg/dL — ABNORMAL LOW (ref >39.00)
Hemoglobin, Urine: NEGATIVE
Hemoglobin: 15 g/dL (ref 13.0–17.0)
LDL Cholesterol: 69 mg/dL (ref 0–99)
Leukocytes, UA: NEGATIVE
Lymphocytes Relative: 21.4 % (ref 12.0–46.0)
Lymphs Abs: 2.1 10*3/uL (ref 0.7–4.0)
MCHC: 34 g/dL (ref 30.0–36.0)
MCV: 92.6 fL (ref 78.0–100.0)
Monocytes Absolute: 0.7 10*3/uL (ref 0.1–1.0)
Monocytes Relative: 7 % (ref 3.0–12.0)
Neutro Abs: 6.4 10*3/uL (ref 1.4–7.7)
Neutrophils Relative %: 65.4 % (ref 43.0–77.0)
Nitrite: NEGATIVE
Platelets: 202 10*3/uL (ref 150.0–400.0)
Potassium: 3.7 meq/L (ref 3.5–5.1)
RBC: 4.75 M/uL (ref 4.22–5.81)
RDW: 12.7 % (ref 11.5–14.6)
Sodium: 141 meq/L (ref 135–145)
Specific Gravity, Urine: 1.025 (ref 1.000–1.030)
TSH: 1.36 microintl units/mL (ref 0.35–5.50)
Total Bilirubin: 1.5 mg/dL — ABNORMAL HIGH (ref 0.3–1.2)
Total CHOL/HDL Ratio: 4
Total Protein, Urine: NEGATIVE mg/dL
Total Protein: 6.6 g/dL (ref 6.0–8.3)
Triglycerides: 116 mg/dL (ref 0.0–149.0)
Urine Glucose: NEGATIVE mg/dL
Urobilinogen, UA: 1 (ref 0.0–1.0)
VLDL: 23.2 mg/dL (ref 0.0–40.0)
WBC: 9.9 10*3/uL (ref 4.5–10.5)
pH: 6.5 (ref 5.0–8.0)

## 2009-11-08 LAB — CONVERTED CEMR LAB
Amphetamine Screen, Ur: NEGATIVE
Barbiturate Quant, Ur: NEGATIVE
Benzodiazepines.: NEGATIVE
Cocaine Metabolites: POSITIVE — AB
Creatinine,U: 343.3 mg/dL
Insulin fasting, serum: 6 microintl units/mL (ref 3–28)
Lithium Lvl: 1.1 meq/L (ref 0.80–1.40)
Marijuana Metabolite: POSITIVE — AB
Methadone: NEGATIVE
Opiate Screen, Urine: NEGATIVE
Phencyclidine (PCP): NEGATIVE
Propoxyphene: NEGATIVE
Valproic Acid Lvl: 44.6 ug/mL — ABNORMAL LOW (ref 50.0–100.0)

## 2009-12-20 ENCOUNTER — Encounter: Payer: Self-pay | Admitting: Internal Medicine

## 2010-01-04 ENCOUNTER — Ambulatory Visit: Payer: Self-pay | Admitting: Internal Medicine

## 2010-02-14 ENCOUNTER — Encounter: Payer: Self-pay | Admitting: Internal Medicine

## 2010-03-17 ENCOUNTER — Ambulatory Visit: Payer: Self-pay | Admitting: Internal Medicine

## 2010-03-17 LAB — CONVERTED CEMR LAB
ALT: 16 units/L (ref 0–53)
AST: 15 units/L (ref 0–37)
Albumin: 4.3 g/dL (ref 3.5–5.2)
Alkaline Phosphatase: 50 units/L (ref 39–117)
BUN: 14 mg/dL (ref 6–23)
Basophils Absolute: 0 10*3/uL (ref 0.0–0.1)
Basophils Relative: 0.5 % (ref 0.0–3.0)
Bilirubin Urine: NEGATIVE
Bilirubin, Direct: 0.2 mg/dL (ref 0.0–0.3)
CO2: 27 meq/L (ref 19–32)
Calcium: 9.4 mg/dL (ref 8.4–10.5)
Chloride: 107 meq/L (ref 96–112)
Cholesterol: 130 mg/dL (ref 0–200)
Creatinine, Ser: 1.3 mg/dL (ref 0.4–1.5)
Eosinophils Absolute: 0.2 10*3/uL (ref 0.0–0.7)
Eosinophils Relative: 3 % (ref 0.0–5.0)
GFR calc non Af Amer: 63.35 mL/min (ref >60)
Glucose, Bld: 99 mg/dL (ref 70–99)
HCT: 41.8 % (ref 39.0–52.0)
HDL: 30.4 mg/dL — ABNORMAL LOW (ref >39.00)
Hemoglobin, Urine: NEGATIVE
Hemoglobin: 14.3 g/dL (ref 13.0–17.0)
Ketones, ur: NEGATIVE mg/dL
LDL Cholesterol: 84 mg/dL (ref 0–99)
Leukocytes, UA: NEGATIVE
Lymphocytes Relative: 24.9 % (ref 12.0–46.0)
Lymphs Abs: 2 10*3/uL (ref 0.7–4.0)
MCHC: 34.2 g/dL (ref 30.0–36.0)
MCV: 92 fL (ref 78.0–100.0)
Monocytes Absolute: 0.5 10*3/uL (ref 0.1–1.0)
Monocytes Relative: 6.9 % (ref 3.0–12.0)
Neutro Abs: 5.1 10*3/uL (ref 1.4–7.7)
Neutrophils Relative %: 64.7 % (ref 43.0–77.0)
Nitrite: NEGATIVE
PSA: 1.6 ng/mL (ref 0.10–4.00)
Platelets: 207 10*3/uL (ref 150.0–400.0)
Potassium: 4.1 meq/L (ref 3.5–5.1)
RBC: 4.54 M/uL (ref 4.22–5.81)
RDW: 13.2 % (ref 11.5–14.6)
Sodium: 140 meq/L (ref 135–145)
Specific Gravity, Urine: 1.01 (ref 1.000–1.030)
TSH: 2.38 microintl units/mL (ref 0.35–5.50)
Total Bilirubin: 1 mg/dL (ref 0.3–1.2)
Total CHOL/HDL Ratio: 4
Total Protein, Urine: NEGATIVE mg/dL
Total Protein: 6.4 g/dL (ref 6.0–8.3)
Triglycerides: 79 mg/dL (ref 0.0–149.0)
Urine Glucose: NEGATIVE mg/dL
Urobilinogen, UA: 0.2 (ref 0.0–1.0)
VLDL: 15.8 mg/dL (ref 0.0–40.0)
WBC: 7.9 10*3/uL (ref 4.5–10.5)
pH: 8 (ref 5.0–8.0)

## 2010-03-29 ENCOUNTER — Encounter: Payer: Self-pay | Admitting: Internal Medicine

## 2010-03-29 ENCOUNTER — Ambulatory Visit: Payer: Self-pay | Admitting: Internal Medicine

## 2010-04-28 ENCOUNTER — Encounter: Payer: Self-pay | Admitting: Internal Medicine

## 2010-05-31 NOTE — Assessment & Plan Note (Signed)
Vital Signs:  Patient Profile:   55 Years Old Male Weight:      221 pounds Pulse rate:   67 / minute BP sitting:   126 / 70                 Chief Complaint:  Cold & URI symptoms.  History of Present Illness: The patient presents with complaints of sore throat, fever, cough, sinus congestion and drainge of several days duration. Not better with OTC meds. Chest hurts with coughing. Can't sleep due to cough. The mucus is coloured.   Current Allergies: ! 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 No apparent alopecia or balding. Ears:     External ear exam shows no significant lesions or deformities.  Otoscopic examination reveals clear canals, tympanic membranes are intact bilaterally without bulging, retraction, inflammation or discharge. Hearing is grossly normal bilaterally. Nose:     External nasal examination shows no deformity or inflammation. Nasal mucosa are pink and moist without lesions or exudates. Mouth:     Oral mucosa and oropharynx without lesions or exudates.  Teeth in good repair.tonsils absent and pharyngeal erythema.   Lungs:     Normal respiratory effort, chest expands symmetrically. Lungs are clear to auscultation, no crackles or wheezes. Heart:     Normal rate and regular rhythm. S1 and S2 normal without gallop, murmur, click, rub  or other extra sounds. Psych:     Cognition and judgment appear intact. Alert and cooperative with normal attention span and concentration. No apparent delusions, illusions, hallucinations    Impression & Recommendations:  Problem # 1:  U R I (ICD-465.9) Assessment: New  His updated medication list for this problem includes:   Zpack. Use OTC medicines for "cold": Tylenol  650mg  or Advil 400mg  every 6 hours  for fever; Delsym or Robutussin for cough. Mucinex or Mucinex D for congestion. Chloraseptic for sore throat. Office visit if not better or if worse.   Problem # 2:  CONSTIPATION (ICD-564.00) Assessment: Unchanged  Due to Rx.His updated medication list for this problem includes:    Miralax Powd (Polyethylene glycol 3350) .Marland KitchenMarland KitchenMarland KitchenMarland Kitchen 17g by mouth once daily as needed constipation   Complete Medication List: 1)  Lithium Carbonate 300 Mg Tabs (Lithium carbonate) .... Qid 1am--3pm 2)  Tenormin 100 Mg Tabs (Atenolol) .... Two times a day 3)  Lamictal 200 Mg Tabs (Lamotrigine) .... Once daily 4)  Amerge 2.5 Mg Tabs (Naratriptan hcl) .... As needed 5)  Lexapro 20 Mg Tabs (Escitalopram oxalate) .... Once daily 6)  Baby Aspirin 81 Mg Chew (Aspirin) .... Once daily 7)  Seroquel 100 Mg Tabs (Quetiapine fumarate) .... Once daily 8)  Klor-con M20 20 Meq Tbcr (Potassium chloride crys cr) .... Once daily 9)  Zithromax  Z-pak 250 Mg Tabs (Azithromycin) .... As directed 10)  Miralax Powd (Polyethylene glycol 3350) .Marland Kitchen.. 17g by mouth once daily as needed constipation   Patient Instructions: 1)  Please schedule a follow-up appointment in 3 months.    ]  Appended Document:  Correction: Was given Ceftin 500mg  two times a day x 4 wks for sinusitis, not Zpack for URI. ENT consult if not better.

## 2010-05-31 NOTE — Letter (Signed)
[facility]: [facility]   Imported By: Lennie Odor 07/29/2009 14:33:19  _____________________________________________________________________  External Attachment:    Type:   Image     Comment:   External Document

## 2010-05-31 NOTE — Letter (Signed)
Summary: [facility]   Imported By: Esmeralda Links D'jimraou 05/15/2008 14:16:10  _____________________________________________________________________  External Attachment:    Type:   Image     Comment:   External Document

## 2010-05-31 NOTE — Progress Notes (Signed)
Summary: TRIAGE-RECTAL BLEEDING  Phone Note From Other Clinic Call back at [phone]   Caller: Mary from Dr Plotnikov's office Call For: Anyone Reason for Call: Schedule Patient Appt Summary of Call: Dr Posey Rea would like this patient seen today for rectal bleeding x2 days  Initial call taken by: Tawni Levy,  June 09, 2009 2:14 PM  Follow-up for Phone Call        Pt. is scheduled for a Colonoscopy with Dr.Gessner on 07-20-08. He has rectal bleeding for 2 days, Dr.Plotnikov wants him seen today. Dr.Gessner will see pt. today, pt. will come directly to the office at this time. Follow-up by: Laureen Ochs LPN,  June 09, 2009 3:05 PM     Appended Document: GI Bleed  Current Allergies: ! IMITREX (SUMATRIPTAN SUCCINATE)

## 2010-05-31 NOTE — Letter (Signed)
Summary: Previsit letter  [facility]  [address] [place], [place] [address]   Phone: [phone]  Fax: [phone]       06/09/2009 MRN: [id number]  Matthew [name] [address] [place], [place]  [address]  Dear Matthew [name],  Welcome to the Gastroenterology Division at [facility].    You are scheduled to see a nurse for your pre-procedure visit on 07-06-09 at 9:00a.m. on the 3rd floor at [facility], [address].  We ask that you try to arrive at our office 15 minutes prior to your appointment time to allow for check-in.  Your nurse visit will consist of discussing your medical and surgical history, your immediate family medical history, and your medications.    Please bring a complete list of all your medications or, if you prefer, bring the medication bottles and we will list them.  We will need to be aware of both prescribed and over the counter drugs.  We will need to know exact dosage information as well.  If you are on blood thinners (Coumadin, Plavix, Aggrenox, Ticlid, etc.) please call our office today/prior to your appointment, as we need to consult with your physician about holding your medication.   Please be prepared to read and sign documents such as consent forms, a financial agreement, and acknowledgement forms.  If necessary, and with your consent, a friend or relative is welcome to sit-in on the nurse visit with you.  Please bring your insurance card so that we may make a copy of it.  If your insurance requires a referral to see a specialist, please bring your referral form from your primary care physician.  No co-pay is required for this nurse visit.     If you cannot keep your appointment, please call [phone] to cancel or reschedule prior to your appointment date.  This allows [place] the opportunity to schedule an appointment for another patient in need of care.    Thank you for choosing [facility] for your medical  needs.  We appreciate the opportunity to care for you.  Please visit [place] at our website  to learn more about our practice.                     Sincerely.                                                                                                                   The Gastroenterology Division

## 2010-05-31 NOTE — Letter (Signed)
Summary: [facility]   Imported By: Sherian Rein 10/06/2009 07:50:40  _____________________________________________________________________  External Attachment:    Type:   Image     Comment:   External Document

## 2010-05-31 NOTE — Assessment & Plan Note (Signed)
Summary: CPX /NWS  #   Vital Signs:  Patient profile:   55 year old male Height:      70.5 inches Weight:      200 pounds BMI:     28.39 Temp:     97.9 degrees F oral Pulse rate:   72 / minute Pulse rhythm:   regular Resp:     16 per minute BP sitting:   136 / 80  (left arm) Cuff size:   regular  Vitals Entered By: Lanier Prude, Beverly Gust) (March 29, 2010 8:42 AM) CC: CPX Is Patient Diabetic? No   Primary Care Provider:  Jacinta Shoe, MD  CC:  CPX.  History of Present Illness: The patient presents for a preventive health examination   Preventive Screening-Counseling & Management  Caffeine-Diet-Exercise     Does Patient Exercise: no  Current Medications (verified): 1)  Lithium Carbonate 300 Mg  Tabs (Lithium Carbonate) .Marland Kitchen.. 1 By Mouth in Am and 1-2 At [facility] 2)  Amerge 2.5 Mg  Tabs (Naratriptan Hcl) .... As Needed 3)  Baby Aspirin 81 Mg  Chew (Aspirin) .... Once Daily 4)  Klonopin 1 Mg Tabs (Clonazepam) .Marland Kitchen.. 1 By Mouth Two Times A Day As Needed Anxiety, Panic Attack 5)  Lotrel 5-20 Mg  Caps (Amlodipine Besy-Benazepril Hcl) .Marland Kitchen.. 1 By Mouth Qd 6)  Depakote Er 250 Mg Tb24 (Divalproex Sodium (Migraine)) .... 2 By Mouth Q Hs 7)  Coreg 25 Mg  Tabs (Carvedilol) .Marland Kitchen.. 1 By Mouth Bid 8)  Vitamin D3 1000 Unit  Tabs (Cholecalciferol) .Marland Kitchen.. 1 Qd 9)  Meperitab 50 Mg  Tabs (Meperidine Hcl) .Marland Kitchen.. 1 By Mouth Once Daily As Needed Severe Migraine 10)  Promethazine Hcl 50 Mg  Tabs (Promethazine Hcl) .Marland Kitchen.. 1 By Mouth Qid As Needed Nausea 11)  Ranitidine Hcl 150 Mg Caps (Ranitidine Hcl) .... Bid  Allergies (verified): 1)  ! 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 cigars Daily Caffeine Use Regular exercise-not lately Does Patient Exercise:  no  Review of Systems       The patient complains of weight gain.  The patient denies anorexia, fever, weight loss, vision loss, decreased hearing, hoarseness, chest pain, syncope, dyspnea on exertion, peripheral edema, prolonged cough, headaches, hemoptysis, abdominal pain, melena, hematochezia, severe indigestion/heartburn, hematuria, incontinence, genital sores, muscle weakness, suspicious skin lesions, transient blindness, difficulty walking, depression, unusual weight change, abnormal bleeding, enlarged lymph nodes, angioedema, and testicular masses.    Physical Exam  General:  Well-developed,well-nourished,in no acute distress; alert,appropriate and cooperative throughout examination Head:  Normocephalic and atraumatic without obvious abnormalities. No apparent alopecia or balding. Eyes:  No corneal or conjunctival inflammation noted. EOMI. Perrla. Funduscopic exam benign, without hemorrhages, exudates or papilledema. Vision grossly normal. Ears:  External ear exam shows no significant lesions or deformities.  Otoscopic examination reveals clear canals, tympanic membranes are intact bilaterally without bulging, retraction, inflammation or discharge. Hearing is grossly normal bilaterally. Nose:  External nasal examination shows no deformity or inflammation. Nasal mucosa are pink and moist without lesions or exudates. Mouth:  Oral mucosa and oropharynx without lesions or exudates.  Teeth in  good repair. Neck:  No deformities, masses, or tenderness noted. No bruit Lungs:  Normal respiratory effort, chest expands symmetrically. Lungs are clear to auscultation, no crackles or wheezes. Heart:  Normal rate and regular  rhythm. S1 and S2 normal without gallop, murmur, click, rub or other extra sounds. Abdomen:  Bowel sounds positive,abdomen soft and non-tender without masses, organomegaly or hernias noted. Rectal:  No external abnormalities noted. Normal sphincter tone. No rectal masses or tenderness. Genitalia:  Testes bilaterally descended without nodularity, tenderness or masses. No scrotal masses or lesions. No penis lesions or urethral discharge. Prostate:  Prostate gland firm and smooth, no enlargement, nodularity, tenderness, mass, asymmetry or induration. Msk:  No deformity or scoliosis noted of thoracic or lumbar spine.   Pulses:  R and L carotid,radial,femoral,dorsalis pedis and posterior tibial pulses are full and equal bilaterally Extremities:  No clubbing, cyanosis, edema, or deformity noted with normal full range of motion of all joints.   Neurologic:  No cranial nerve deficits noted. Station and gait are normal. Plantar reflexes are down-going bilaterally. DTRs are symmetrical throughout. Sensory, motor and coordinative functions appear intact. Skin:  Intact without suspicious lesions or rashes Cervical Nodes:  No lymphadenopathy noted Inguinal Nodes:  No significant adenopathy Psych:  normally interactive and good eye contact.  not anxious appearing and not depressed appearing.     Impression & Recommendations:  Problem # 1:  ROUTINE GENERAL MEDICAL EXAM@HEALTH  CARE FACL (ICD-V70.0) Assessment New The labs were reviewed with the patient.  Health and age related issues were discussed. Available screening tests and vaccinations were discussed as well. Healthy life style including good diet and exercise was discussed.  Orders: EKG w/ Interpretation (93000) Loose wt  Problem # 2:  GERD (ICD-530.81) Assessment: Unchanged  His updated medication list for this problem includes:    Ranitidine Hcl 150 Mg Caps (Ranitidine hcl) ..... Bid  Problem # 3:  ANXIETY (ICD-300.00) Assessment:  Unchanged  His updated medication list for this problem includes:    Klonopin 1 Mg Tabs (Clonazepam) .Marland Kitchen... 1 by mouth two times a day as needed anxiety, panic attack  Problem # 4:  MIGRAINE VARIANTS, W/INTRACTABLE MIGRAINE (ICD-346.21) Assessment: Unchanged  His updated medication list for this problem includes:    Amerge 2.5 Mg Tabs (Naratriptan hcl) .Marland Kitchen... As needed    Baby Aspirin 81 Mg Chew (Aspirin) ..... Once daily    Coreg 25 Mg Tabs (Carvedilol) .Marland Kitchen... 1 by mouth bid    Meperitab 50 Mg Tabs (Meperidine hcl) .Marland Kitchen... 1 by mouth once daily as needed severe migraine  Problem # 5:  BIPOLAR DEPRESSION (ICD-296.7) Assessment: Comment Only On the regimen of medicine(s) reflected in the chart    Complete Medication List: 1)  Lithium Carbonate 300 Mg Tabs (Lithium carbonate) .Marland Kitchen.. 1 by mouth in am and 1-2 at hs 2)  Amerge 2.5 Mg Tabs (Naratriptan hcl) .... As needed 3)  Baby Aspirin 81 Mg Chew (Aspirin) .... Once daily 4)  Klonopin 1 Mg Tabs (Clonazepam) .Marland Kitchen.. 1 by mouth two times a day as needed anxiety, panic attack 5)  Lotrel 5-20 Mg Caps (Amlodipine besy-benazepril hcl) .Marland Kitchen.. 1 by mouth qd 6)  Depakote Er 250 Mg Tb24 (Divalproex sodium (migraine)) .... 2 by mouth q hs 7)  Coreg 25 Mg Tabs (Carvedilol) .Marland Kitchen.. 1 by mouth bid 8)  Vitamin D3 1000 Unit Tabs (Cholecalciferol) .Marland Kitchen.. 1 qd 9)  Meperitab 50 Mg Tabs (Meperidine hcl) .Marland Kitchen.. 1 by mouth once daily as needed severe migraine 10)  Promethazine Hcl  50 Mg Tabs (Promethazine hcl) .Marland Kitchen.. 1 by mouth qid as needed nausea 11)  Ranitidine Hcl 150 Mg Caps (Ranitidine hcl) .... Bid  Patient Instructions: 1)  Please schedule a follow-up appointment in 4 months. Prescriptions: PROMETHAZINE HCL 50 MG  TABS (PROMETHAZINE HCL) 1 by mouth qid as needed nausea  #30 x 1   Entered and Authorized by:   Tresa Garter MD   Signed by:   Tresa Garter MD on 03/29/2010   Method used:   Electronically to        [facility]. [phone]* (retail)        [address]       [place], [place]  [address]       Ph: [phone]       Fax: [phone]   RxID:   [id number]    Orders Added: 1)  EKG w/ Interpretation [93000] 2)  Est. Patient 40-64 years [[address]]

## 2010-05-31 NOTE — Progress Notes (Signed)
Summary: Nexium  Phone Note From Pharmacy   Summary of Call: Pt's insurance will not cover Prevacid, per Dr Posey Rea, change to nexium. Left mess to call office back to inform  Initial call taken by: Lamar Sprinkles,  May 31, 2007 8:18 AM  Follow-up for Phone Call        Pt informed  Follow-up by: Lamar Sprinkles,  May 31, 2007 9:38 AM    New/Updated Medications: NEXIUM 40 MG CPDR (ESOMEPRAZOLE MAGNESIUM) Take 1 tab each morning   Prescriptions: NEXIUM 40 MG CPDR (ESOMEPRAZOLE MAGNESIUM) Take 1 tab each morning  #30 x 11   Entered by:   Lamar Sprinkles   Authorized by:   Tresa Garter MD   Signed by:   Lamar Sprinkles on 05/31/2007   Method used:   Electronically sent to ...       [id number]       [address] [place], [place]  [address]       Ph: [phone]       Fax: [phone]   RxID:   [id number]

## 2010-05-31 NOTE — Consult Note (Signed)
Summary: [facility]   Imported By: Lester  12/06/2007 09:38:14  _____________________________________________________________________  External Attachment:    Type:   Image     Comment:   External Document

## 2010-05-31 NOTE — Letter (Signed)
Summary: [facility]   Imported By: Lester [place] 01/06/2010 09:58:13  _____________________________________________________________________  External Attachment:    Type:   Image     Comment:   External Document

## 2010-05-31 NOTE — Letter (Signed)
Summary: [facility]   Imported By: Lester [place] 11/18/2009 10:02:55  _____________________________________________________________________  External Attachment:    Type:   Image     Comment:   External Document

## 2010-05-31 NOTE — Assessment & Plan Note (Signed)
Summary: 3  mo rov /$50 Natale Milch   Vital Signs:  Patient Profile:   55 Years Old Male Weight:      187 pounds Temp:     97.6 degrees F oral Pulse rate:   60 / minute BP sitting:   140 / 90  (left arm)  Vitals Entered By: Tora Perches (May 12, 2008 7:54 AM)                 Chief Complaint:  Multiple medical problems or concerns.  History of Present Illness: The patient presents for a follow up of hypertension,HA, bipolar C/o RUQ  infrequent pain after meals w/irrad to back; oss severe     Current Allergies (reviewed today): ! IMITREX (SUMATRIPTAN SUCCINATE)  Past Medical History:    Reviewed history from 11/26/2007 and no changes required:       Hypertension       Biopolar disorder 296.62       OSA       Migraines       Anxiety/ panic attacks       BPV       Substance use   Family History:    Reviewed history from 04/22/2007 and no changes required:       Family History of Arthritis       Family History Hypertension  Social History:    Reviewed history from 04/22/2007 and no changes required:       Occupation: on disability for Bipolar              Never Smoked       Alcohol use-no       Married  marital stress 2008     Physical Exam  General:     Well-developed,well-nourished,in no acute distress; alert,appropriate and cooperative throughout examination Eyes:     No corneal or conjunctival inflammation noted. EOMI. Perrla. Funduscopic exam benign, without hemorrhages, exudates or papilledema. Vision grossly normal. Nose:     External nasal examination shows no deformity or inflammation. Nasal mucosa are pink and moist without lesions or exudates. Mouth:     Oral mucosa and oropharynx without lesions or exudates.  Teeth in good repair. Neck:     No deformities, masses, or tenderness noted. Lungs:     Normal respiratory effort, chest expands symmetrically. Lungs are clear to auscultation, no crackles or wheezes. Heart:     Normal rate and regular  rhythm. S1 and S2 normal without gallop, murmur, click, rub or other extra sounds. Abdomen:     RUQ sensitive Msk:     No deformity or scoliosis noted of thoracic or lumbar spine.   Pulses:     R and L carotid,radial,femoral,dorsalis pedis and posterior tibial pulses are full and equal bilaterally Extremities:     No clubbing, cyanosis, edema, or deformity noted with normal full range of motion of all joints.   Neurologic:     No cranial nerve deficits noted. Station and gait are normal. Plantar reflexes are down-going bilaterally. DTRs are symmetrical throughout. Sensory, motor and coordinative functions appear intact. Skin:     Intact without suspicious lesions or rashes Psych:     normally interactive and good eye contact.      Impression & Recommendations:  Problem # 1:  ABDOMINAL PAIN (ICD-789.00) - PUD vs GB problem Assessment: Deteriorated Cont Nexium and can add Ranitidine Get an [place] Orders: Radiology Referral (Radiology)   Problem # 2:  ANXIETY (ICD-300.00) Assessment: Unchanged  His updated medication  list for this problem includes:    Klonopin 1 Mg Tabs (Clonazepam) .Marland Kitchen... 1 by mouth two times a day as needed anxiety, panic attack   Problem # 3:  BIPOLAR DEPRESSION (ICD-296.7)  Problem # 4:  NAUSEA WITH VOMITING (ICD-787.01) Assessment: Improved  Problem # 5:  MIGRAINE VARIANTS, W/INTRACTABLE MIGRAINE (ICD-346.21) Assessment: Unchanged  His updated medication list for this problem includes:    Amerge 2.5 Mg Tabs (Naratriptan hcl) .Marland Kitchen... As needed    Baby Aspirin 81 Mg Chew (Aspirin) ..... Once daily    Coreg 25 Mg Tabs (Carvedilol) .Marland Kitchen... 1 by mouth bid    Meperitab 50 Mg Tabs (Meperidine hcl) .Marland Kitchen... 1 by mouth once daily as needed severe migraine   Complete Medication List: 1)  Lithium Carbonate 300 Mg Tabs (Lithium carbonate) .Marland Kitchen.. 1 by mouth in am and 1-2 at hs 2)  Amerge 2.5 Mg Tabs (Naratriptan hcl) .... As needed 3)  Baby Aspirin 81 Mg Chew (Aspirin)  .... Once daily 4)  Seroquel 100 Mg Tabs (Quetiapine fumarate) .... Once daily 5)  Klonopin 1 Mg Tabs (Clonazepam) .Marland Kitchen.. 1 by mouth two times a day as needed anxiety, panic attack 6)  Lotrel 5-20 Mg Caps (Amlodipine besy-benazepril hcl) .Marland Kitchen.. 1 by mouth qd 7)  Nexium 40 Mg Cpdr (Esomeprazole magnesium) .... Take 1 tab each morning 8)  Depakote Er 250 Mg Tb24 (Divalproex sodium (migraine)) .... 2 by mouth q hs 9)  Coreg 25 Mg Tabs (Carvedilol) .Marland Kitchen.. 1 by mouth bid 10)  Vitamin D3 1000 Unit Tabs (Cholecalciferol) .Marland Kitchen.. 1 qd 11)  Meperitab 50 Mg Tabs (Meperidine hcl) .Marland Kitchen.. 1 by mouth once daily as needed severe migraine 12)  Promethazine Hcl 50 Mg Tabs (Promethazine hcl) .Marland Kitchen.. 1 by mouth qid as needed nausea 13)  Ranitidine Hcl 150 Mg Caps (Ranitidine hcl) .... Bid   Patient Instructions: 1)  Please schedule a follow-up appointment in 3 months. 2)  Labs in 1-3 wks 3)  BMP prior to visit, ICD-9: 4)  Hepatic Panel prior to visit, ICD-9: 5)  CBC w/ Diff prior to visit, ICD-9: 6)  Urine-dip prior to visit, ICD-9: 7)  Lipase   Prescriptions: MEPERITAB 50 MG  TABS (MEPERIDINE HCL) 1 by mouth once daily as needed severe migraine  #12 x 0   Entered and Authorized by:   Tresa Garter MD   Signed by:   Tresa Garter MD on 05/12/2008   Method used:   Print then Give to Patient   RxID:   [id number] KLONOPIN 1 MG TABS (CLONAZEPAM) 1 by mouth two times a day as needed anxiety, panic attack  #30 x 1   Entered and Authorized by:   Tresa Garter MD   Signed by:   Tresa Garter MD on 05/12/2008   Method used:   Print then Give to Patient   RxID:   [id number] COREG 25 MG  TABS (CARVEDILOL) 1 by mouth bid  #60 x 12   Entered and Authorized by:   Tresa Garter MD   Signed by:   Tresa Garter MD on 05/12/2008   Method used:   Electronically to        [facility] (retail)       [address] [place], [place]   [address]       Ph: [phone]       Fax: [phone]  RxID:   [id number] NEXIUM 40 MG CPDR (ESOMEPRAZOLE MAGNESIUM) Take 1 tab each morning  #30 x 11   Entered and Authorized by:   Tresa Garter MD   Signed by:   Tresa Garter MD on 05/12/2008   Method used:   Electronically to        [facility] (retail)       [address] [place], [place]  [address]       Ph: [phone]       Fax: [phone]   RxID:   [id number] LOTREL 5-20 MG  CAPS (AMLODIPINE BESY-BENAZEPRIL HCL) 1 by mouth qd  #30 x 12   Entered and Authorized by:   Tresa Garter MD   Signed by:   Tresa Garter MD on 05/12/2008   Method used:   Electronically to        [facility] (retail)       [address] [place], [place]  [address]       Ph: [phone]       Fax: [phone]   RxID:   [id number] RANITIDINE HCL 150 MG CAPS (RANITIDINE HCL) bid  #60 x 6   Entered and Authorized by:   Tresa Garter MD   Signed by:   Tresa Garter MD on 05/12/2008   Method used:   Electronically to        [facility] (retail)       [address] [place], [place]  [address]       Ph: [phone]       Fax: [phone]   RxID:   [id number]

## 2010-05-31 NOTE — Procedures (Signed)
Summary: Colonoscopy  Patient: Jaydeen Odor Note: All result statuses are Final unless otherwise noted.  Tests: (1) Colonoscopy (COL)   COL Colonoscopy           DONE     [facility]     [address] [place], [place]  [address]           COLONOSCOPY PROCEDURE REPORT           PATIENT:  Murdock, Jellison  MR#:  [id number]     BIRTHDATE:  12-Nov-1955, 53 yrs. old  GENDER:  male     ENDOSCOPIST:  Rachael Fee, MD     Referred by:  Linda Hedges Plotnikov, M.D.     PROCEDURE DATE:  06/10/2009     PROCEDURE:  Colonoscopy, Diagnostic     ASA CLASS:  Class II     INDICATIONS:  rectal bleeding     MEDICATIONS:   Fentanyl 100 mcg IV, Versed 9 mg IV           DESCRIPTION OF PROCEDURE:   After the risks benefits and     alternatives of the procedure were thoroughly explained, informed     consent was obtained.  Digital rectal exam was performed and     revealed no rectal masses.   The  endoscope was introduced through     the anus and advanced to the cecum, which was identified by both     the appendix and ileocecal valve, without limitations.  The     quality of the prep was excellent, using Colyte.  The instrument     was then slowly withdrawn as the colon was fully examined.     <<PROCEDUREIMAGES>>           FINDINGS:  There was no blood in colon.  Internal and external     hemorrhoids were found. These were small to medium sized,     non-thrombosed (see image3).  This was otherwise a normal     examination of the colon (see image2 and image1).   Retroflexed     views in the rectum revealed no abnormalities.    The scope was     then withdrawn from the patient and the procedure completed.           COMPLICATIONS:  None           ENDOSCOPIC IMPRESSION:     1) Internal and external hemorrhoids; these are possibly the     source of recent recent rectal bleeding.     2) Otherwise normal examination; No diverticulosis, no poylps,     no cancers.           I am most  suspicious that this was hemorrhoidal bleeding.  Of     niote, he has not bled enough to become anemic or hemodynamically     unstable.  Small diverticulosis pockets can sometimes be missed on     colonoscopy, but none were seen on this examination.           RECOMMENDATIONS:     Will follow clinically today.  He did have some red rectal     bleeding during prep, but there is no blood in colon now.  If no     significant rebleeding, will plan to discharge him later today.     Will start preparation H ointment to hemorrhoids, twice daily     for the next 6-7 days, then PRN.  ______________________________     Rachael Fee, MD           cc: Stan Head, MD           n.     eSIGNED:   Rachael Fee at 06/10/2009 09:33 AM           [name], [id number]  Note: An exclamation mark (!) indicates a result that was not dispersed into the flowsheet. Document Creation Date: 06/10/2009 9:34 AM _______________________________________________________________________  (1) Order result status: Final Collection or observation date-time: 06/10/2009 09:16 Requested date-time:  Receipt date-time:  Reported date-time:  Referring Physician:   Ordering Physician: Rob Bunting [phone]) Specimen Source:  Source: Launa Grill Order Number: [id number] Lab site:

## 2010-05-31 NOTE — Assessment & Plan Note (Signed)
Summary: SD   Vital Signs:  Patient Profile:   55 Years Old Male Weight:      183 pounds Temp:     97.8 degrees F oral Pulse rate:   56 / minute BP sitting:   149 / 92  (left arm)  Vitals Entered By: Tora Perches (April 22, 2007 10:47 AM)                 Chief Complaint:  Multiple medical problems or concerns.  History of Present Illness: The patient presents for a walk in post-hospital visit for CP and dizziness. Episodes of panic. It has happened before, once in his psych. office, was relieved with Klonopin. A lot of stress at home now.  Last Wed had CP, pressure like, felt chocked up, R arm pain.   Dizzy since last AM. Room was spinning. Face was numb, sweaty. HR 42, BB was high. Took a BP pill, fell asleep, felt disoriented. Went to ER on Sun. BP was 192/99. He does not remember much.  Had an EKG. Taking his meds sporadically due to $$$.  No CP or HA now    Current Allergies: ! IMITREX (SUMATRIPTAN SUCCINATE)  Past Medical History:    Hypertension    Biopolar disorder 296.62    OSA    Migraines    Anxiety/ panic attacks    BPV   Family History:    Family History of Arthritis    Family History Hypertension  Social History:    Occupation: on disability for Bipolar        Never Smoked    Alcohol use-no    Married  marital stress 2008    Review of Systems       The patient complains of chest pain.  The patient denies syncope, dyspnea on exhertion, and prolonged cough.         Stress in marriage   Physical Exam  General:     Well-developed,well-nourished,in no acute distress; alert,appropriate and cooperative throughout examination Head:     Normocephalic and atraumatic without obvious abnormalities. No apparent alopecia or balding. Ears:     External ear exam shows no significant lesions or deformities.  Otoscopic examination reveals clear canals, tympanic membranes are intact bilaterally without bulging, retraction, inflammation or  discharge. Hearing is grossly normal bilaterally. Nose:     External nasal examination shows no deformity or inflammation. Nasal mucosa are pink and moist without lesions or exudates. Mouth:     Oral mucosa and oropharynx without lesions or exudates.  Teeth in good repair. Neck:     No deformities, masses, or tenderness noted. Lungs:     Normal respiratory effort, chest expands symmetrically. Lungs are clear to auscultation, no crackles or wheezes. Heart:     Normal rate and regular rhythm. S1 and S2 normal without gallop, murmur, click, rub or other extra sounds. Abdomen:     Bowel sounds positive,abdomen soft and non-tender without masses, organomegaly or hernias noted. Msk:     No deformity or scoliosis noted of thoracic or lumbar spine.   Neurologic:     No cranial nerve deficits noted. Station and gait are normal. Plantar reflexes are down-going bilaterally. DTRs are symmetrical throughout. Sensory, motor and coordinative functions appear intact. Skin:     Intact without suspicious lesions or rashes Psych:     Not suicidal or homicidal. depressed affect.      Impression & Recommendations:  Problem # 1:  CHEST PAIN, UNSPECIFIED (ICD-786.50) Atypical. Stress related. CL  stress test  Problem # 2:  VERTIGO (ICD-780.4) BPV. B-D exersise. His updated medication list for this problem includes:    Meclizine Hcl 25 Mg Tabs (Meclizine hcl) .Marland Kitchen... 1 by mouth two times a day as needed vertigo   Problem # 3:  BIPOLAR DEPRESSION (ICD-296.7) Assessment: Deteriorated Deteriorated due to marital stress. Unable to afford his Rx. He'll call his psychiatrist to change meds to less expensive.  Problem # 4:  HYPERTENSION (ICD-401.9) If cont to have problems 24 h urine for catecholamines, etc The following medications were removed from the medication list:    Accupril 20 Mg Tabs (Quinapril hcl) .Marland Kitchen... 1 qd  His updated medication list for this problem includes:    Tenormin 100 Mg Tabs  (Atenolol) .Marland Kitchen..Marland Kitchen Two times a day    Benazepril Hcl 20 Mg Tabs (Benazepril hcl) .Marland Kitchen... 1 by mouth daily   Problem # 5:  PANIC ATTACK (ICD-300.01) with CP  His updated medication list for this problem includes:    Lexapro 20 Mg Tabs (Escitalopram oxalate) ..... Once daily    Klonopin 1 Mg Tabs (Clonazepam) .Marland Kitchen... 1 by mouth two times a day as needed anxiety, panic attack   Complete Medication List: 1)  Lithium Carbonate 300 Mg Tabs (Lithium carbonate) .... Qid 1am--3pm 2)  Tenormin 100 Mg Tabs (Atenolol) .... Two times a day 3)  Lamictal 200 Mg Tabs (Lamotrigine) .... Once daily 4)  Amerge 2.5 Mg Tabs (Naratriptan hcl) .... As needed 5)  Lexapro 20 Mg Tabs (Escitalopram oxalate) .... Once daily 6)  Baby Aspirin 81 Mg Chew (Aspirin) .... Once daily 7)  Seroquel 100 Mg Tabs (Quetiapine fumarate) .... Once daily 8)  Klor-con M20 20 Meq Tbcr (Potassium chloride crys cr) .... Once daily 9)  Zithromax Z-pak 250 Mg Tabs (Azithromycin) .... As directed 10)  Miralax Powd (Polyethylene glycol 3350) .Marland Kitchen.. 17g by mouth once daily as needed constipation 11)  Meperidine-promethazine 50-25 Mg Caps (Meperidine-promethazine) .Marland Kitchen.. 1 by mouth qid as needed severe pain 12)  Benazepril Hcl 20 Mg Tabs (Benazepril hcl) .Marland Kitchen.. 1 by mouth daily 13)  Meclizine Hcl 25 Mg Tabs (Meclizine hcl) .Marland Kitchen.. 1 by mouth two times a day as needed vertigo 14)  Klonopin 1 Mg Tabs (Clonazepam) .Marland Kitchen.. 1 by mouth two times a day as needed anxiety, panic attack  Other Orders: Cardiology Referral (Cardiology)   Patient Instructions: 1)  Please schedule a follow-up appointment in 1 month.    Prescriptions: KLONOPIN 1 MG TABS (CLONAZEPAM) 1 by mouth two times a day as needed anxiety, panic attack  #30 x 1   Entered and Authorized by:   Tresa Garter MD   Signed by:   Tresa Garter MD on 04/22/2007   Method used:   Print then Give to Patient   RxID:   [id number] MECLIZINE HCL 25 MG TABS (MECLIZINE HCL) 1 by mouth  two times a day as needed vertigo  #60 x 3   Entered and Authorized by:   Tresa Garter MD   Signed by:   Tresa Garter MD on 04/22/2007   Method used:   Print then Give to Patient   RxID:   [id number] BENAZEPRIL HCL 20 MG  TABS (BENAZEPRIL HCL) 1 by mouth daily  #30 x 12   Entered and Authorized by:   Tresa Garter MD   Signed by:   Tresa Garter MD on 04/22/2007   Method used:   Print then Give to Patient   RxID:  [id number]  ] 

## 2010-05-31 NOTE — Progress Notes (Signed)
Summary: Bleeding  Phone Note Call from Patient Call back at Home Phone [phone]   Summary of Call: Patient left message on triage asking that the MD be notified that he is still bleeding. Please advise. Initial call taken by: Lucious Groves,  June 09, 2009 8:25 AM  Follow-up for Phone Call        Can we have him be seen by GI today For Dx - acute bleed HD stable, pls? If he bled a lot and  is dizzy or lightheaded - needs to go to ER Follow-up by: Tresa Garter MD,  June 09, 2009 8:35 AM  Additional Follow-up for Phone Call Additional follow up Details #1::        Spoke with patient and he is not dizzy or light headed. Patient is aware I will call him with GI appt. Additional Follow-up by: Lucious Groves,  June 09, 2009 8:37 AM    Additional Follow-up for Phone Call Additional follow up Details #2::    Patient is being seen by Dr Leone Payor today per Laureen Ochs, pt is aware. Follow-up by: Dagoberto Reef,  June 09, 2009 3:38 PM

## 2010-05-31 NOTE — Letter (Signed)
Summary: [facility]   Imported By: Sherian Rein 12/08/2008 11:49:18  _____________________________________________________________________  External Attachment:    Type:   Image     Comment:   External Document

## 2010-05-31 NOTE — Progress Notes (Signed)
Summary: lab request  Phone Note Call from Patient Call back at Home Phone [phone]   Details for Reason: [place] [address] [place], [place] [address] Details of Complaint: PH:[phone] [phone] Summary of Call: Patient stopped by the office with a note from Dr Julien Girt of [facility] requesting the following labs: cbc diff, cmet, tsh, free t, fasting insulin, lipids, ua, uds, lithium level, lamotrigine level, and valproate level. dx: v58.69  Is this ok to do at our lab per the above MD request? Initial call taken by: Lucious Groves,  November 03, 2009 8:33 AM  Follow-up for Phone Call        ok Thx 995.20 Follow-up by: Tresa Garter MD,  November 03, 2009 9:07 AM  Additional Follow-up for Phone Call Additional follow up Details #1::        Pt informed, lab in [facility] Additional Follow-up by: Lamar Sprinkles, CMA,  November 03, 2009 5:44 PM

## 2010-05-31 NOTE — Assessment & Plan Note (Signed)
Summary: FU Matthew [name] #   Vital Signs:  Patient profile:   55 year old male Weight:      194 pounds BMI:     27.94 Temp:     97 degrees F oral Pulse rate:   62 / minute BP sitting:   138 / 84  (left arm)  Vitals Entered By: Tora Perches (June 08, 2009 8:01 AM) CC: f/u Is Patient Diabetic? No   CC:  f/u.  History of Present Illness: The patient presents for a follow up of hypertension, depression, migraine, hyperlipidemia. Bled from rectum x 2 d w/bm   Preventive Screening-Counseling & Management  Alcohol-Tobacco     Smoking Status: current  Current Medications (verified): 1)  Lithium Carbonate 300 Mg  Tabs (Lithium Carbonate) .Marland Kitchen.. 1 By Mouth in Am and 1-2 At [facility] 2)  Amerge 2.5 Mg  Tabs (Naratriptan Hcl) .... As Needed 3)  Baby Aspirin 81 Mg  Chew (Aspirin) .... Once Daily 4)  Klonopin 1 Mg Tabs (Clonazepam) .Marland Kitchen.. 1 By Mouth Two Times A Day As Needed Anxiety, Panic Attack 5)  Lotrel 5-20 Mg  Caps (Amlodipine Besy-Benazepril Hcl) .Marland Kitchen.. 1 By Mouth Qd 6)  Nexium 40 Mg Cpdr (Esomeprazole Magnesium) .... Take 1 Tab Each Morning 7)  Depakote Er 250 Mg Tb24 (Divalproex Sodium (Migraine)) .... 2 By Mouth Q Hs 8)  Coreg 25 Mg  Tabs (Carvedilol) .Marland Kitchen.. 1 By Mouth Bid 9)  Vitamin D3 1000 Unit  Tabs (Cholecalciferol) .Marland Kitchen.. 1 Qd 10)  Meperitab 50 Mg  Tabs (Meperidine Hcl) .Marland Kitchen.. 1 By Mouth Once Daily As Needed Severe Migraine 11)  Promethazine Hcl 50 Mg  Tabs (Promethazine Hcl) .Marland Kitchen.. 1 By Mouth Qid As Needed Nausea 12)  Ranitidine Hcl 150 Mg Caps (Ranitidine Hcl) .... Bid 13)  Amerge 1 Mg Tabs (Naratriptan Hcl) 14)  Seroquel 300 Mg Tabs (Quetiapine Fumarate) .... 2 By Mouth Qhs  Allergies: 1)  ! Imitrex (Sumatriptan Succinate)  Past History:  Past Medical History: Last updated: 11/26/2007 Hypertension Biopolar disorder 296.62 OSA Migraines Anxiety/ panic attacks BPV Substance use  Family History: Last updated: 04/22/2007 Family History of Arthritis Family History  Hypertension  Social History: Last updated: 04/22/2007 Occupation: on disability for Bipolar  Never Smoked Alcohol use-no Married  marital stress 2008  Past Surgical History: Hemorrhoidectomy 1992 Dr Earlene Plater Tonsillectomy  Social History: Smoking Status:  current  Review of Systems       The patient complains of headaches and hematochezia.  The patient denies fever, syncope, dyspnea on exertion, abdominal pain, and melena.    Physical Exam  General:  Well-developed,well-nourished,in no acute distress; alert,appropriate and cooperative throughout examination Ears:  External ear exam shows no significant lesions or deformities.  Otoscopic examination reveals clear canals, tympanic membranes are intact bilaterally without bulging, retraction, inflammation or discharge. Hearing is grossly normal bilaterally. Mouth:  Oral mucosa and oropharynx without lesions or exudates.  Teeth in good repair. Lungs:  Normal respiratory effort, chest expands symmetrically. Lungs are clear to auscultation, no crackles or wheezes. Heart:  Normal rate and regular rhythm. S1 and S2 normal without gallop, murmur, click, rub or other extra sounds. Abdomen:  S/NT Rectal:  deffered to GI Msk:  R shoulder w/full ROM Neurologic:  Skin over R arm is overly sensitive  Skin:  Intact without suspicious lesions or rashes Psych:  normally interactive and good eye contact.     Impression & Recommendations:  Problem # 1:  HEMATOCHEZIA (ICD-578.1) Assessment New Call if you are not  well r in a reasonable amount of time or if worse. Go to ER if bleedingreally bad!  He had a sig or a colon in [place]  3-4 y ago - nl  Orders: Gastroenterology Referral (GI) for colon Dr Leone Payor  Problem # 2:  HYPERTENSION (ICD-401.9) Assessment: Improved  His updated medication list for this problem includes:    Lotrel 5-20 Mg Caps (Amlodipine besy-benazepril hcl) .Marland Kitchen... 1 by mouth qd    Coreg 25 Mg Tabs (Carvedilol) .Marland Kitchen... 1  by mouth bid  BP today: 138/84 Prior BP: 166/92 (12/24/2008)  Labs Reviewed: K+: 4.0 (06/03/2009) Creat: : 1.4 (06/03/2009)   Chol: 136 (06/03/2009)   HDL: 38.40 (06/03/2009)   LDL: 76 (06/03/2009)   TG: 109.0 (06/03/2009)  Problem # 3:  BIPOLAR DEPRESSION (ICD-296.7) Assessment: Comment Only On prescription therapy   Problem # 4:  MIGRAINE VARIANTS, W/INTRACTABLE MIGRAINE (ICD-346.21) Assessment: Unchanged  The following medications were removed from the medication list:    Amerge 1 Mg Tabs (Naratriptan hcl)    Hydrocodone-acetaminophen 10-325 Mg Tabs (Hydrocodone-acetaminophen) .Marland Kitchen... 1 by mouth up to 4 time per day as needed for pain His updated medication list for this problem includes:    Amerge 2.5 Mg Tabs (Naratriptan hcl) .Marland Kitchen... As needed    Baby Aspirin 81 Mg Chew (Aspirin) ..... Once daily    Coreg 25 Mg Tabs (Carvedilol) .Marland Kitchen... 1 by mouth bid    Meperitab 50 Mg Tabs (Meperidine hcl) .Marland Kitchen... 1 by mouth once daily as needed severe migraine  Complete Medication List: 1)  Lithium Carbonate 300 Mg Tabs (Lithium carbonate) .Marland Kitchen.. 1 by mouth in am and 1-2 at hs 2)  Amerge 2.5 Mg Tabs (Naratriptan hcl) .... As needed 3)  Baby Aspirin 81 Mg Chew (Aspirin) .... Once daily 4)  Klonopin 1 Mg Tabs (Clonazepam) .Marland Kitchen.. 1 by mouth two times a day as needed anxiety, panic attack 5)  Lotrel 5-20 Mg Caps (Amlodipine besy-benazepril hcl) .Marland Kitchen.. 1 by mouth qd 6)  Nexium 40 Mg Cpdr (Esomeprazole magnesium) .... Take 1 tab each morning 7)  Depakote Er 250 Mg Tb24 (Divalproex sodium (migraine)) .... 2 by mouth q hs 8)  Coreg 25 Mg Tabs (Carvedilol) .Marland Kitchen.. 1 by mouth bid 9)  Vitamin D3 1000 Unit Tabs (Cholecalciferol) .Marland Kitchen.. 1 qd 10)  Meperitab 50 Mg Tabs (Meperidine hcl) .Marland Kitchen.. 1 by mouth once daily as needed severe migraine 11)  Promethazine Hcl 50 Mg Tabs (Promethazine hcl) .Marland Kitchen.. 1 by mouth qid as needed nausea 12)  Ranitidine Hcl 150 Mg Caps (Ranitidine hcl) .... Bid 13)  Seroquel 300 Mg Tabs (Quetiapine  fumarate) .... 2 by mouth qhs  Other Orders: Tdap => 31yrs IM ([address]109) Admin 1st Vaccine (60454) Admin 1st Vaccine [facility]) [phone])  Patient Instructions: 1)  Please schedule a follow-up appointment in 3 months. 2)  Lithium level in March 995.20 3)  Loose wt   Tetanus/Td Vaccine    Vaccine Type: Tdap    Site: left deltoid    Mfr: [employer]    Dose: 0.5 ml    Route: IM    Given by: Tora Perches    Exp. Date: 06/26/2011    Lot #: [id number]    VIS given: 03/19/07 version given June 08, 2009.

## 2010-05-31 NOTE — Assessment & Plan Note (Signed)
Summary: 1 MO ROV/NWS   Vital Signs:  Patient Profile:   55 Years Old Male Weight:      192 pounds Temp:     97.5 degrees F oral Pulse rate:   48 / minute BP sitting:   159 / 101  (left arm)  Vitals Entered By: Tora Perches (May 28, 2007 10:45 AM)             Is Patient Diabetic? No     Chief Complaint:  Multiple medical problems or concerns.  History of Present Illness: The patient presents for a follow up of hypertension, CP, stress.   Current Allergies: ! IMITREX (SUMATRIPTAN SUCCINATE)  Past Medical History:    Reviewed history from 04/22/2007 and no changes required:       Hypertension       Biopolar disorder 296.62       OSA       Migraines       Anxiety/ panic attacks       BPV   Family History:    Reviewed history from 04/22/2007 and no changes required:       Family History of Arthritis       Family History Hypertension  Social History:    Reviewed history from 04/22/2007 and no changes required:       Occupation: on disability for Bipolar              Never Smoked       Alcohol use-no       Married  marital stress 2008     Physical Exam  General:     Well-developed,well-nourished,in no acute distress; alert,appropriate and cooperative throughout examination Eyes:     No corneal or conjunctival inflammation noted. EOMI. Perrla. Funduscopic exam benign, without hemorrhages, exudates or papilledema. Vision grossly normal. Mouth:     Oral mucosa and oropharynx without lesions or exudates.  Teeth in good repair. Neck:     No deformities, masses, or tenderness noted. Lungs:     Normal respiratory effort, chest expands symmetrically. Lungs are clear to auscultation, no crackles or wheezes. Heart:     Normal rate and regular rhythm. S1 and S2 normal without gallop, murmur, click, rub or other extra sounds. Abdomen:     Bowel sounds positive,abdomen soft and non-tender without masses, organomegaly or hernias noted. Msk:     No deformity or  scoliosis noted of thoracic or lumbar spine.   Neurologic:     No cranial nerve deficits noted. Station and gait are normal. Plantar reflexes are down-going bilaterally. DTRs are symmetrical throughout. Sensory, motor and coordinative functions appear intact. Skin:     Intact without suspicious lesions or rashes Psych:     Oriented X3, good eye contact, and not anxious appearing.      Impression & Recommendations:  Problem # 1:  HYPERTENSION (ICD-401.9) Assessment: Deteriorated Will change Rx The following medications were removed from the medication list:    Tenormin 100 Mg Tabs (Atenolol) .Marland Kitchen..Marland Kitchen Two times a day    Benazepril Hcl 20 Mg Tabs (Benazepril hcl) .Marland Kitchen... 1 by mouth daily  His updated medication list for this problem includes:    Inderal La 160 Mg Cp24 (Propranolol hcl) .Marland Kitchen... 1 po qd    Lotrel 5-20 Mg Caps (Amlodipine besy-benazepril hcl) .Marland Kitchen... 1 by mouth qd   Problem # 2:  PANIC ATTACK (ICD-300.01) Assessment: Improved  His updated medication list for this problem includes:    Lexapro 20 Mg Tabs (Escitalopram oxalate) .Marland KitchenMarland KitchenMarland KitchenMarland Kitchen  Once daily    Klonopin 1 Mg Tabs (Clonazepam) .Marland Kitchen... 1 by mouth two times a day as needed anxiety, panic attack   Problem # 3:  CHEST PAIN, UNSPECIFIED (ICD-786.50) Assessment: Improved Resolved Orders: TLB-Electrolyte Panel (NA/K/CL/CO2) (80051-LYTES)   Problem # 4:  BIPOLAR DEPRESSION (ICD-296.7) Assessment: Improved On Rx Orders: T-Lithium Level (16109-60454) TLB-Hepatic/Liver Function Pnl (80076-HEPATIC)   Complete Medication List: 1)  Lithium Carbonate 300 Mg Tabs (Lithium carbonate) .... Qid 1am--3pm 2)  Lamictal 200 Mg Tabs (Lamotrigine) .... Once daily 3)  Amerge 2.5 Mg Tabs (Naratriptan hcl) .... As needed 4)  Lexapro 20 Mg Tabs (Escitalopram oxalate) .... Once daily 5)  Baby Aspirin 81 Mg Chew (Aspirin) .... Once daily 6)  Seroquel 100 Mg Tabs (Quetiapine fumarate) .... Once daily 7)  Klor-con M20 20 Meq Tbcr (Potassium  chloride crys cr) .... Once daily 8)  Zithromax Z-pak 250 Mg Tabs (Azithromycin) .... As directed 9)  Miralax Powd (Polyethylene glycol 3350) .Marland Kitchen.. 17g by mouth once daily as needed constipation 10)  Meperidine-promethazine 50-25 Mg Caps (Meperidine-promethazine) .Marland Kitchen.. 1 by mouth qid as needed severe pain 11)  Meclizine Hcl 25 Mg Tabs (Meclizine hcl) .Marland Kitchen.. 1 by mouth two times a day as needed vertigo 12)  Klonopin 1 Mg Tabs (Clonazepam) .Marland Kitchen.. 1 by mouth two times a day as needed anxiety, panic attack 13)  Inderal La 160 Mg Cp24 (Propranolol hcl) .Marland Kitchen.. 1 po qd 14)  Lotrel 5-20 Mg Caps (Amlodipine besy-benazepril hcl) .Marland Kitchen.. 1 by mouth qd 15)  Vitamin D3 1000 Unit Tabs (Cholecalciferol) .Marland Kitchen.. 1 qd   Patient Instructions: 1)  Please schedule a follow-up appointment in 3 months. 2)  BMP prior to visit, ICD-9: 4501.1 3)  TSH prior to visit, ICD-9:    Prescriptions: LOTREL 5-20 MG  CAPS (AMLODIPINE BESY-BENAZEPRIL HCL) 1 by mouth qd  #30 x 12   Entered and Authorized by:   Tresa Garter MD   Signed by:   Tresa Garter MD on 05/28/2007   Method used:   Print then Give to Patient   RxID:   [id number] INDERAL LA 160 MG  CP24 (PROPRANOLOL HCL) 1 po qd  #30 x 12   Entered and Authorized by:   Tresa Garter MD   Signed by:   Tresa Garter MD on 05/28/2007   Method used:   Print then Give to Patient   RxID:   [id number]  ]

## 2010-05-31 NOTE — Assessment & Plan Note (Signed)
Summary: 3 MO ROV /NWS $50   Vital Signs:  Patient Profile:   55 Years Old Male Weight:      178 pounds Temp:     97.1 degrees F oral Pulse rate:   61 / minute BP sitting:   136 / 90  (left arm)  Vitals Entered By: Tora Perches (November 26, 2007 8:11 AM)                 Chief Complaint:  Multiple medical problems or concerns.  History of Present Illness: The patient presents for a follow up of HTN, anxiety, depression and headaches. C/o vomiting in AM, vertical vertigo x 4-5 wks. Stopped seroquel and topamax.    Current Allergies (reviewed today): ! IMITREX (SUMATRIPTAN SUCCINATE)  Past Medical History:    Reviewed history from 04/22/2007 and no changes required:       Hypertension       Biopolar disorder 296.62       OSA       Migraines       Anxiety/ panic attacks       BPV       Substance use   Family History:    Reviewed history from 04/22/2007 and no changes required:       Family History of Arthritis       Family History Hypertension  Social History:    Reviewed history from 04/22/2007 and no changes required:       Occupation: on disability for Bipolar              Never Smoked       Alcohol use-no       Married  marital stress 2008    Review of Systems  The patient denies fever, chest pain, syncope, dyspnea on exertion, abdominal pain, and severe indigestion/heartburn.         vomiting The rest of the 18 point ROS is negative except for noted above.    Physical Exam  General:     Well-developed,well-nourished,in no acute distress; alert,appropriate and cooperative throughout examination Eyes:     No corneal or conjunctival inflammation noted. EOMI. Perrla. Funduscopic exam benign, without hemorrhages, exudates or papilledema. Vision grossly normal. Ears:     External ear exam shows no significant lesions or deformities.  Otoscopic examination reveals clear canals, tympanic membranes are intact bilaterally without bulging, retraction,  inflammation or discharge. Hearing is grossly normal bilaterally. Nose:     External nasal examination shows no deformity or inflammation. Nasal mucosa are pink and moist without lesions or exudates. Mouth:     Oral mucosa and oropharynx without lesions or exudates.  Teeth in good repair. Neck:     No deformities, masses, or tenderness noted. Lungs:     Normal respiratory effort, chest expands symmetrically. Lungs are clear to auscultation, no crackles or wheezes. Heart:     Normal rate and regular rhythm. S1 and S2 normal without gallop, murmur, click, rub or other extra sounds. Abdomen:     Bowel sounds positive,abdomen soft and non-tender without masses, organomegaly or hernias noted. Msk:     No deformity or scoliosis noted of thoracic or lumbar spine.   Extremities:     No clubbing, cyanosis, edema, or deformity noted with normal full range of motion of all joints.   Neurologic:     No cranial nerve deficits noted. Station and gait are normal. Plantar reflexes are down-going bilaterally. DTRs are symmetrical throughout. Sensory, motor and coordinative functions appear intact.  Skin:     Intact without suspicious lesions or rashes Psych:     Oriented X3 and normally interactive.      Impression & Recommendations:  Problem # 1:  BIPOLAR DEPRESSION (ICD-296.7) Cut back Lithium. He will discuss Rx with his psychiatrist  Problem # 2:  NAUSEA WITH VOMITING (ICD-787.01) Assessment: Deteriorated D/c Lamictal . As per #4. Get labs   Problem # 3:  HYPERTENSION (ICD-401.9)  His updated medication list for this problem includes:    Lotrel 5-20 Mg Caps (Amlodipine besy-benazepril hcl) .Marland Kitchen... 1 by mouth qd    Coreg 25 Mg Tabs (Carvedilol) .Marland Kitchen... 1 by mouth bid   Problem # 4:  COUNSELING ON SUBSTANCE USE AND ABUSE (ICD-V65.42) Assessment: New Risks of illegal substances use were discussed. The labs were reviewd with the patient. >45 min    Complete Medication List: 1)  Lithium  Carbonate 300 Mg Tabs (Lithium carbonate) .Marland Kitchen.. 1 by mouth in am and 1-2 at hs 2)  Amerge 2.5 Mg Tabs (Naratriptan hcl) .... As needed 3)  Baby Aspirin 81 Mg Chew (Aspirin) .... Once daily 4)  Seroquel 100 Mg Tabs (Quetiapine fumarate) .... Once daily 5)  Miralax Powd (Polyethylene glycol 3350) .Marland Kitchen.. 17g by mouth once daily as needed constipation 6)  Klonopin 1 Mg Tabs (Clonazepam) .Marland Kitchen.. 1 by mouth two times a day as needed anxiety, panic attack 7)  Lotrel 5-20 Mg Caps (Amlodipine besy-benazepril hcl) .Marland Kitchen.. 1 by mouth qd 8)  Nexium 40 Mg Cpdr (Esomeprazole magnesium) .... Take 1 tab each morning 9)  Depakote Er 250 Mg Tb24 (Divalproex sodium (migraine)) .... 2 by mouth q hs 10)  Coreg 25 Mg Tabs (Carvedilol) .Marland Kitchen.. 1 by mouth bid 11)  Vitamin D3 1000 Unit Tabs (Cholecalciferol) .Marland Kitchen.. 1 qd 12)  Meperitab 50 Mg Tabs (Meperidine hcl) .Marland Kitchen.. 1 by mouth once daily as needed severe migraine 13)  Promethazine Hcl 50 Mg Tabs (Promethazine hcl) .Marland Kitchen.. 1 by mouth qid as needed nausea   Patient Instructions: 1)  Please schedule a follow-up appointment in 2 months. 2)  BMP prior to visit, ICD-9: 3)  Hepatic Panel prior to visit, ICD-9: 4)  Lipase 5)  Lithium  995.2   Prescriptions: NEXIUM 40 MG CPDR (ESOMEPRAZOLE MAGNESIUM) Take 1 tab each morning  #30 x 11   Entered and Authorized by:   Tresa Garter MD   Signed by:   Tresa Garter MD on 11/26/2007   Method used:   Print then Give to Patient   RxID:   [id number]  ]

## 2010-05-31 NOTE — Assessment & Plan Note (Signed)
Summary: f/u appt/cd   Vital Signs:  Patient profile:   55 year old male Height:      70.5 inches Weight:      193 pounds BMI:     27.40 O2 Sat:      97 % on Room air Temp:     97.6 degrees F oral Pulse rate:   53 / minute Pulse rhythm:   regular BP sitting:   142 / 90  (left arm) Cuff size:   large  Vitals Entered By: Rock Nephew CMA (Sep 01, 2009 10:19 AM)  O2 Flow:  Room air  Primary Care Provider:  Jacinta Shoe, MD   History of Present Illness: The patient presents for a follow up of hypertension, GERD, hyperlipidemia   Current Medications (verified): 1)  Lithium Carbonate 300 Mg  Tabs (Lithium Carbonate) .Marland Kitchen.. 1 By Mouth in Am and 1-2 At [facility] 2)  Amerge 2.5 Mg  Tabs (Naratriptan Hcl) .... As Needed 3)  Baby Aspirin 81 Mg  Chew (Aspirin) .... Once Daily 4)  Klonopin 1 Mg Tabs (Clonazepam) .Marland Kitchen.. 1 By Mouth Two Times A Day As Needed Anxiety, Panic Attack 5)  Lotrel 5-20 Mg  Caps (Amlodipine Besy-Benazepril Hcl) .Marland Kitchen.. 1 By Mouth Qd 6)  Nexium 40 Mg Cpdr (Esomeprazole Magnesium) .... Take 1 Tab Each Morning 7)  Depakote Er 250 Mg Tb24 (Divalproex Sodium (Migraine)) .... 2 By Mouth Q Hs 8)  Coreg 25 Mg  Tabs (Carvedilol) .Marland Kitchen.. 1 By Mouth Bid 9)  Vitamin D3 1000 Unit  Tabs (Cholecalciferol) .Marland Kitchen.. 1 Qd 10)  Meperitab 50 Mg  Tabs (Meperidine Hcl) .Marland Kitchen.. 1 By Mouth Once Daily As Needed Severe Migraine 11)  Promethazine Hcl 50 Mg  Tabs (Promethazine Hcl) .Marland Kitchen.. 1 By Mouth Qid As Needed Nausea 12)  Ranitidine Hcl 150 Mg Caps (Ranitidine Hcl) .... Bid  Allergies (verified): 1)  ! Imitrex (Sumatriptan Succinate)  Past History:  Past Medical History: Hypertension Biopolar disorder 296.62 OSA Migraines Anxiety/ panic attacks BPV Substance use GERD  Review of Systems  The patient denies fever, weight loss, weight gain, chest pain, and dyspnea on exertion.    Physical Exam  General:  Well-developed,well-nourished,in no acute distress; alert,appropriate and cooperative  throughout examination Mouth:  Oral mucosa and oropharynx without lesions or exudates.  Teeth in good repair. Lungs:  Normal respiratory effort, chest expands symmetrically. Lungs are clear to auscultation, no crackles or wheezes. Heart:  Normal rate and regular rhythm. S1 and S2 normal without gallop, murmur, click, rub or other extra sounds. Abdomen:  S/NT Msk:  R shoulder w/full ROM Extremities:  No clubbing, cyanosis, edema, or deformity noted with normal full range of motion of all joints.   Neurologic:  Skin over R arm is overly sensitive  Skin:  Intact without suspicious lesions or rashes Psych:  normally interactive and good eye contact.     Impression & Recommendations:  Problem # 1:  GERD (ICD-530.81) Assessment Unchanged  The following medications were removed from the medication list:    Nexium 40 Mg Cpdr (Esomeprazole magnesium) .Marland Kitchen... Take 1 tab each morning His updated medication list for this problem includes:    Ranitidine Hcl 150 Mg Caps (Ranitidine hcl) ..... Bid  Problem # 2:  ANXIETY (ICD-300.00) Assessment: Unchanged  His updated medication list for this problem includes:    Klonopin 1 Mg Tabs (Clonazepam) .Marland Kitchen... 1 by mouth two times a day as needed anxiety, panic attack  Problem # 3:  MIGRAINE VARIANTS, W/INTRACTABLE MIGRAINE (ICD-346.21)  His updated medication list for this problem includes:    Amerge 2.5 Mg Tabs (Naratriptan hcl) .Marland Kitchen... As needed    Baby Aspirin 81 Mg Chew (Aspirin) ..... Once daily    Coreg 25 Mg Tabs (Carvedilol) .Marland Kitchen... 1 by mouth bid    Meperitab 50 Mg Tabs (Meperidine hcl) .Marland Kitchen... 1 by mouth once daily as needed severe migraine  Problem # 4:  HYPERTENSION (ICD-401.9) Assessment: Unchanged BP good at home His updated medication list for this problem includes:    Lotrel 5-20 Mg Caps (Amlodipine besy-benazepril hcl) .Marland Kitchen... 1 by mouth qd    Coreg 25 Mg Tabs (Carvedilol) .Marland Kitchen... 1 by mouth bid  BP today: 142/90 Prior BP: 132/80  (06/09/2009)  Labs Reviewed: K+: 4.0 (06/03/2009) Creat: : 1.4 (06/03/2009)   Chol: 136 (06/03/2009)   HDL: 38.40 (06/03/2009)   LDL: 76 (06/03/2009)   TG: 109.0 (06/03/2009)  Complete Medication List: 1)  Lithium Carbonate 300 Mg Tabs (Lithium carbonate) .Marland Kitchen.. 1 by mouth in am and 1-2 at hs 2)  Amerge 2.5 Mg Tabs (Naratriptan hcl) .... As needed 3)  Baby Aspirin 81 Mg Chew (Aspirin) .... Once daily 4)  Klonopin 1 Mg Tabs (Clonazepam) .Marland Kitchen.. 1 by mouth two times a day as needed anxiety, panic attack 5)  Lotrel 5-20 Mg Caps (Amlodipine besy-benazepril hcl) .Marland Kitchen.. 1 by mouth qd 6)  Depakote Er 250 Mg Tb24 (Divalproex sodium (migraine)) .... 2 by mouth q hs 7)  Coreg 25 Mg Tabs (Carvedilol) .Marland Kitchen.. 1 by mouth bid 8)  Vitamin D3 1000 Unit Tabs (Cholecalciferol) .Marland Kitchen.. 1 qd 9)  Meperitab 50 Mg Tabs (Meperidine hcl) .Marland Kitchen.. 1 by mouth once daily as needed severe migraine 10)  Promethazine Hcl 50 Mg Tabs (Promethazine hcl) .Marland Kitchen.. 1 by mouth qid as needed nausea 11)  Ranitidine Hcl 150 Mg Caps (Ranitidine hcl) .... Bid  Patient Instructions: 1)  Please schedule a follow-up appointment in 4 months. 2)  BMP prior to visit, ICD-9:

## 2010-05-31 NOTE — Letter (Signed)
Summary: [facility]   Imported By: Sherian Rein 03/17/2010 09:59:17  _____________________________________________________________________  External Attachment:    Type:   Image     Comment:   External Document

## 2010-05-31 NOTE — Assessment & Plan Note (Signed)
Summary: 4 MONTH FOLLOW UP-LB   Vital Signs:  Patient profile:   55 year old male Height:      70.5 inches Weight:      190 pounds BMI:     26.97 O2 Sat:      96 % on Room air Temp:     97.4 degrees F oral Pulse rate:   55 / minute Pulse rhythm:   regular Resp:     16 per minute BP sitting:   155 / 85  (left arm) Cuff size:   regular  Vitals Entered By: Lanier Prude, CMA(AAMA) (January 04, 2010 7:50 AM)  O2 Flow:  Room air CC: 4 mo f/u Is Patient Diabetic? No   Primary Care Provider:  Jacinta Shoe, MD  CC:  4 mo f/u.  History of Present Illness: The patient presents for a follow up of HTN, anxiety, depression.   Current Medications (verified): 1)  Lithium Carbonate 300 Mg  Tabs (Lithium Carbonate) .Marland Kitchen.. 1 By Mouth in Am and 1-2 At [facility] 2)  Amerge 2.5 Mg  Tabs (Naratriptan Hcl) .... As Needed 3)  Baby Aspirin 81 Mg  Chew (Aspirin) .... Once Daily 4)  Klonopin 1 Mg Tabs (Clonazepam) .Marland Kitchen.. 1 By Mouth Two Times A Day As Needed Anxiety, Panic Attack 5)  Lotrel 5-20 Mg  Caps (Amlodipine Besy-Benazepril Hcl) .Marland Kitchen.. 1 By Mouth Qd 6)  Depakote Er 250 Mg Tb24 (Divalproex Sodium (Migraine)) .... 2 By Mouth Q Hs 7)  Coreg 25 Mg  Tabs (Carvedilol) .Marland Kitchen.. 1 By Mouth Bid 8)  Vitamin D3 1000 Unit  Tabs (Cholecalciferol) .Marland Kitchen.. 1 Qd 9)  Meperitab 50 Mg  Tabs (Meperidine Hcl) .Marland Kitchen.. 1 By Mouth Once Daily As Needed Severe Migraine 10)  Promethazine Hcl 50 Mg  Tabs (Promethazine Hcl) .Marland Kitchen.. 1 By Mouth Qid As Needed Nausea 11)  Ranitidine Hcl 150 Mg Caps (Ranitidine Hcl) .... Bid  Allergies (verified): 1)  ! Imitrex (Sumatriptan Succinate)  Past History:  Past Medical History: Last updated: 09/01/2009 Hypertension Biopolar disorder 296.62 OSA Migraines Anxiety/ panic attacks BPV Substance use GERD  Social History: Last updated: 06/09/2009 Occupation: on disability for [employer] Alcohol use-no Married  marital stress 2008 Patient currently smokes. cigars Daily Caffeine  Use  Review of Systems  The patient denies fever, chest pain, syncope, and abdominal pain.    Physical Exam  General:  Well-developed,well-nourished,in no acute distress; alert,appropriate and cooperative throughout examination Nose:  External nasal examination shows no deformity or inflammation. Nasal mucosa are pink and moist without lesions or exudates. Mouth:  Oral mucosa and oropharynx without lesions or exudates.  Teeth in good repair. Neck:  No deformities, masses, or tenderness noted. Lungs:  Normal respiratory effort, chest expands symmetrically. Lungs are clear to auscultation, no crackles or wheezes. Heart:  Normal rate and regular rhythm. S1 and S2 normal without gallop, murmur, click, rub or other extra sounds. Abdomen:  S/NT Msk:  R shoulder w/full ROM Extremities:  No clubbing, cyanosis, edema, or deformity noted with normal full range of motion of all joints.   Neurologic:  Skin over R arm is overly sensitive  Skin:  Intact without suspicious lesions or rashes Psych:  normally interactive and good eye contact.     Impression & Recommendations:  Problem # 1:  HYPERTENSION (ICD-401.9) Assessment Deteriorated Check BP at home and call me in 3 d w/readings His updated medication list for this problem includes:    Lotrel 5-20 Mg Caps (Amlodipine besy-benazepril hcl) .Marland Kitchen... 1 by  mouth qd    Coreg 25 Mg Tabs (Carvedilol) .Marland Kitchen... 1 by mouth bid  BP today: 160/110 Prior BP: 142/90 (09/01/2009)  Labs Reviewed: K+: 3.7 (11/04/2009) Creat: : 1.4 (11/04/2009)   Chol: 121 (11/04/2009)   HDL: 29.30 (11/04/2009)   LDL: 69 (11/04/2009)   TG: 116.0 (11/04/2009)  Problem # 2:  MIGRAINE VARIANTS, W/INTRACTABLE MIGRAINE (ICD-346.21) Assessment: Unchanged  His updated medication list for this problem includes:    Amerge 2.5 Mg Tabs (Naratriptan hcl) .Marland Kitchen... As needed    Baby Aspirin 81 Mg Chew (Aspirin) ..... Once daily    Coreg 25 Mg Tabs (Carvedilol) .Marland Kitchen... 1 by mouth bid     Meperitab 50 Mg Tabs (Meperidine hcl) .Marland Kitchen... 1 by mouth once daily as needed severe migraine  Problem # 3:  BIPOLAR DEPRESSION (ICD-296.7) Assessment: Unchanged On the regimen of medicine(s) reflected in the chart    Problem # 4:  COUNSELING ON SUBSTANCE USE AND ABUSE (ICD-V65.42) Assessment: Comment Only The labs were reviewed with the patient. He had a long talk about it w/Dr Merilynn Finland  Complete Medication List: 1)  Lithium Carbonate 300 Mg Tabs (Lithium carbonate) .Marland Kitchen.. 1 by mouth in am and 1-2 at hs 2)  Amerge 2.5 Mg Tabs (Naratriptan hcl) .... As needed 3)  Baby Aspirin 81 Mg Chew (Aspirin) .... Once daily 4)  Klonopin 1 Mg Tabs (Clonazepam) .Marland Kitchen.. 1 by mouth two times a day as needed anxiety, panic attack 5)  Lotrel 5-20 Mg Caps (Amlodipine besy-benazepril hcl) .Marland Kitchen.. 1 by mouth qd 6)  Depakote Er 250 Mg Tb24 (Divalproex sodium (migraine)) .... 2 by mouth q hs 7)  Coreg 25 Mg Tabs (Carvedilol) .Marland Kitchen.. 1 by mouth bid 8)  Vitamin D3 1000 Unit Tabs (Cholecalciferol) .Marland Kitchen.. 1 qd 9)  Meperitab 50 Mg Tabs (Meperidine hcl) .Marland Kitchen.. 1 by mouth once daily as needed severe migraine 10)  Promethazine Hcl 50 Mg Tabs (Promethazine hcl) .Marland Kitchen.. 1 by mouth qid as needed nausea 11)  Ranitidine Hcl 150 Mg Caps (Ranitidine hcl) .... Bid  Other Orders: Admin 1st Vaccine (24401) Flu Vaccine 28yrs + (02725)  Patient Instructions: 1)  Please schedule a follow-up appointment in 3 months well w/labs. 2)  Normal BP 130/85 or less Prescriptions: KLONOPIN 1 MG TABS (CLONAZEPAM) 1 by mouth two times a day as needed anxiety, panic attack  #60 x 1   Entered and Authorized by:   Tresa Garter MD   Signed by:   Tresa Garter MD on 01/04/2010   Method used:   Print then Give to Patient   RxID:   [id number]   .lbflu   Flu Vaccine Consent Questions     Do you have a history of severe allergic reactions to this vaccine? no    Any prior history of allergic reactions to egg and/or gelatin? no    Do you  have a sensitivity to the preservative Thimersol? no    Do you have a past history of Guillan-Barre Syndrome? no    Do you currently have an acute febrile illness? no    Have you ever had a severe reaction to latex? no    Vaccine information given and explained to patient? yes    Are you currently pregnant? no    Lot Number:[id number]   Exp Date:10/29/2010   Site Given  Left Deltoid IM Lanier Prude, [facility])  January 04, 2010 8:10 AM

## 2010-05-31 NOTE — Letter (Signed)
Summary: Center for [facility]  [facility]  [place]   Imported By: Lester Altamont 04/23/2008 12:54:12  _____________________________________________________________________  External Attachment:    Type:   Image     Comment:   External Document

## 2010-06-22 NOTE — Consult Note (Signed)
Summary: Center for [facility]   Imported By: Sherian Rein 06/15/2010 09:27:03  _____________________________________________________________________  External Attachment:    Type:   Image     Comment:   External Document

## 2010-07-20 LAB — PROTIME-INR
INR: 1.03 (ref 0.00–1.49)
Prothrombin Time: 13.4 seconds (ref 11.6–15.2)

## 2010-07-20 LAB — CBC
HCT: 42.9 % (ref 39.0–52.0)
Hemoglobin: 15.1 g/dL (ref 13.0–17.0)
MCHC: 35.1 g/dL (ref 30.0–36.0)
MCV: 90.9 fL (ref 78.0–100.0)
Platelets: 275 10*3/uL (ref 150–400)
RBC: 4.72 MIL/uL (ref 4.22–5.81)
RDW: 13.5 % (ref 11.5–15.5)
WBC: 10.8 10*3/uL — ABNORMAL HIGH (ref 4.0–10.5)

## 2010-07-20 LAB — COMPREHENSIVE METABOLIC PANEL
ALT: 22 U/L (ref 0–53)
AST: 19 U/L (ref 0–37)
Albumin: 4.3 g/dL (ref 3.5–5.2)
Alkaline Phosphatase: 48 U/L (ref 39–117)
BUN: 20 mg/dL (ref 6–23)
CO2: 23 mEq/L (ref 19–32)
Calcium: 9.8 mg/dL (ref 8.4–10.5)
Chloride: 110 mEq/L (ref 96–112)
Creatinine, Ser: 1.27 mg/dL (ref 0.4–1.5)
GFR calc Af Amer: >60 mL/min (ref >60)
GFR calc non Af Amer: 59 mL/min — ABNORMAL LOW (ref >60)
Glucose, Bld: 96 mg/dL (ref 70–99)
Potassium: 4 mEq/L (ref 3.5–5.1)
Sodium: 139 mEq/L (ref 135–145)
Total Bilirubin: 1 mg/dL (ref 0.3–1.2)
Total Protein: 6.7 g/dL (ref 6.0–8.3)

## 2010-07-20 LAB — HEMOGLOBIN AND HEMATOCRIT, BLOOD
HCT: 38.4 % — ABNORMAL LOW (ref 39.0–52.0)
Hemoglobin: 13.5 g/dL (ref 13.0–17.0)

## 2010-07-20 LAB — ABO/RH: ABO/RH(D): O POS

## 2010-07-20 LAB — APTT: aPTT: 33 seconds (ref 24–37)

## 2010-07-20 LAB — TYPE AND SCREEN
ABO/RH(D): O POS
Antibody Screen: NEGATIVE

## 2010-08-01 ENCOUNTER — Ambulatory Visit (INDEPENDENT_AMBULATORY_CARE_PROVIDER_SITE_OTHER): Payer: Managed Care, Other (non HMO) | Admitting: Internal Medicine

## 2010-08-01 ENCOUNTER — Encounter: Payer: Self-pay | Admitting: Internal Medicine

## 2010-08-01 DIAGNOSIS — F319 Bipolar disorder, unspecified: Secondary | ICD-10-CM

## 2010-08-01 DIAGNOSIS — K219 Gastro-esophageal reflux disease without esophagitis: Secondary | ICD-10-CM

## 2010-08-01 DIAGNOSIS — I10 Essential (primary) hypertension: Secondary | ICD-10-CM

## 2010-08-01 DIAGNOSIS — G43119 Migraine with aura, intractable, without status migrainosus: Secondary | ICD-10-CM

## 2010-08-01 DIAGNOSIS — G43819 Other migraine, intractable, without status migrainosus: Secondary | ICD-10-CM

## 2010-08-01 NOTE — Progress Notes (Signed)
  Subjective:    Patient ID: Matthew [name], male    DOB: 1956/04/11, 55 y.o.   MRN: [id number]  HPI  The patient is here to follow up on HTN, chronic bipolar depression, anxiety, headaches and chronic GERD symptoms controlled with medicines, diet and exercise. He had stress yesterday and has not taken his BP meds yet... Denies stimulants use.    Review of Systems  Constitutional: Negative for activity change and fatigue.  HENT: Negative for ear pain and sneezing.   Eyes: Negative for photophobia and discharge.  Respiratory: Negative for cough.   Genitourinary: Negative for frequency.  Musculoskeletal: Negative for back pain.  Skin: Negative for rash.  Neurological: Negative for dizziness.  Hematological: Negative for adenopathy.  Psychiatric/Behavioral: Negative for suicidal ideas and agitation.       Objective:   Physical Exam  Constitutional: He is oriented to person, place, and time. He appears well-developed.  HENT:  Mouth/Throat: Oropharynx is clear and moist.  Eyes: Conjunctivae are normal. Pupils are equal, round, and reactive to light.  Neck: Normal range of motion. No JVD present. No thyromegaly present.  Cardiovascular: Normal rate, regular rhythm, normal heart sounds and intact distal pulses.  Exam reveals no gallop and no friction rub.   No murmur heard. Pulmonary/Chest: Effort normal and breath sounds normal. No respiratory distress. He has no wheezes. He has no rales. He exhibits no tenderness.  Abdominal: Soft. Bowel sounds are normal. He exhibits no distension and no mass. There is no tenderness. There is no rebound and no guarding.  Musculoskeletal: Normal range of motion. He exhibits no edema and no tenderness.  Lymphadenopathy:    He has no cervical adenopathy.  Neurological: He is alert and oriented to person, place, and time. He has normal reflexes. No cranial nerve deficit. He exhibits normal muscle tone. Coordination normal.  Skin: Skin is warm and dry. No  rash noted.  Psychiatric: He has a normal mood and affect. His behavior is normal. Judgment and thought content normal.          Assessment & Plan:  R 180/100 L  190/104  BP Readings from Last 3 Encounters:  08/01/10 190/104  03/29/10 136/80  01/04/10 155/85    HYPERTENSION Worse - ? Reason. Given Azor 10/40 here in place of Amlo/Benaz today. Take Coreg at home. Call if BP is up at home. RTC in 1 mo.  GERD On Rx  BIPOLAR DEPRESSION Cont Rx  MIGRAINE VARIANTS, W/INTRACTABLE MIGRAINE On Rx

## 2010-08-01 NOTE — Assessment & Plan Note (Signed)
On Rx 

## 2010-08-01 NOTE — Assessment & Plan Note (Signed)
Worse - ? Reason. Given Azor 10/40 here in place of Amlo/Benaz today. Take Coreg at home. Call if BP is up at home. RTC in 1 mo.

## 2010-08-01 NOTE — Patient Instructions (Signed)
Normal BP<130/85 

## 2010-08-01 NOTE — Assessment & Plan Note (Signed)
Cont Rx 

## 2010-09-04 ENCOUNTER — Other Ambulatory Visit: Payer: Self-pay | Admitting: Internal Medicine

## 2010-09-05 ENCOUNTER — Other Ambulatory Visit (INDEPENDENT_AMBULATORY_CARE_PROVIDER_SITE_OTHER): Payer: Managed Care, Other (non HMO)

## 2010-09-05 DIAGNOSIS — I10 Essential (primary) hypertension: Secondary | ICD-10-CM

## 2010-09-05 DIAGNOSIS — F319 Bipolar disorder, unspecified: Secondary | ICD-10-CM

## 2010-09-05 LAB — BASIC METABOLIC PANEL
BUN: 14 mg/dL (ref 6–23)
CO2: 26 mEq/L (ref 19–32)
Calcium: 9.1 mg/dL (ref 8.4–10.5)
Chloride: 108 mEq/L (ref 96–112)
Creatinine, Ser: 1.2 mg/dL (ref 0.4–1.5)
GFR: 64.42 mL/min (ref >60.00)
Glucose, Bld: 100 mg/dL — ABNORMAL HIGH (ref 70–99)
Potassium: 4.1 mEq/L (ref 3.5–5.1)
Sodium: 140 mEq/L (ref 135–145)

## 2010-09-05 LAB — TSH: TSH: 1.6 u[IU]/mL (ref 0.35–5.50)

## 2010-09-06 LAB — VALPROIC ACID LEVEL: Valproic Acid Lvl: <1 ug/mL — ABNORMAL LOW (ref 50.0–100.0)

## 2010-09-09 ENCOUNTER — Encounter: Payer: Self-pay | Admitting: Internal Medicine

## 2010-09-12 ENCOUNTER — Encounter: Payer: Self-pay | Admitting: Internal Medicine

## 2010-09-12 ENCOUNTER — Ambulatory Visit (INDEPENDENT_AMBULATORY_CARE_PROVIDER_SITE_OTHER): Payer: Managed Care, Other (non HMO) | Admitting: Internal Medicine

## 2010-09-12 DIAGNOSIS — G43119 Migraine with aura, intractable, without status migrainosus: Secondary | ICD-10-CM

## 2010-09-12 DIAGNOSIS — I10 Essential (primary) hypertension: Secondary | ICD-10-CM

## 2010-09-12 DIAGNOSIS — F319 Bipolar disorder, unspecified: Secondary | ICD-10-CM

## 2010-09-12 DIAGNOSIS — K219 Gastro-esophageal reflux disease without esophagitis: Secondary | ICD-10-CM

## 2010-09-12 DIAGNOSIS — F411 Generalized anxiety disorder: Secondary | ICD-10-CM

## 2010-09-12 DIAGNOSIS — G43819 Other migraine, intractable, without status migrainosus: Secondary | ICD-10-CM

## 2010-09-12 DIAGNOSIS — R42 Dizziness and giddiness: Secondary | ICD-10-CM

## 2010-09-12 DIAGNOSIS — R109 Unspecified abdominal pain: Secondary | ICD-10-CM

## 2010-09-12 MED ORDER — RANITIDINE HCL 150 MG PO CAPS: 150.0000 mg | ORAL_CAPSULE | Freq: Two times a day (BID) | ORAL | Status: DC

## 2010-09-12 MED ORDER — CARVEDILOL 25 MG PO TABS: 25.0000 mg | ORAL_TABLET | Freq: Two times a day (BID) | ORAL | Status: DC

## 2010-09-12 MED ORDER — PROMETHAZINE HCL 50 MG PO TABS: 50.0000 mg | ORAL_TABLET | Freq: Four times a day (QID) | ORAL | Status: DC | PRN

## 2010-09-12 MED ORDER — AMLODIPINE BESY-BENAZEPRIL HCL 5-20 MG PO CAPS: 1.0000 | ORAL_CAPSULE | Freq: Every day | ORAL | Status: DC

## 2010-09-12 NOTE — Assessment & Plan Note (Signed)
On Rx 

## 2010-09-12 NOTE — Assessment & Plan Note (Signed)
Doing well 

## 2010-09-12 NOTE — Progress Notes (Signed)
  Subjective:    Patient ID: Matthew [name], male    DOB: 1955-09-02, 55 y.o.   MRN: [id number]  HPI  The patient presents for a follow-up of  chronic hypertension, chronic dyslipidemia, bipolar disorder BP is OK at home.    Review of Systems  HENT: Negative for sore throat and tinnitus.   Eyes: Negative for pain.  Respiratory: Negative for cough.   Gastrointestinal: Negative for diarrhea and blood in stool.  Hematological: Negative for adenopathy.  Psychiatric/Behavioral: Negative for dysphoric mood. The patient is hyperactive.        Objective:   Physical Exam  Neck: No JVD present. No tracheal deviation present.  Pulmonary/Chest: He has no rales.  Musculoskeletal: He exhibits no edema.  Neurological: Coordination normal.          Assessment & Plan:  HYPERTENSION Doing well  ABDOMINAL PAIN On Rx  GERD On RX  ANXIETY On Rx  BIPOLAR DEPRESSION On Rx  VERTIGO On Rx  MIGRAINE VARIANTS, W/INTRACTABLE MIGRAINE Doing well

## 2010-09-12 NOTE — Assessment & Plan Note (Signed)
On RX 

## 2010-09-27 ENCOUNTER — Other Ambulatory Visit: Payer: Self-pay | Admitting: Internal Medicine

## 2010-09-28 ENCOUNTER — Other Ambulatory Visit: Payer: Self-pay | Admitting: *Deleted

## 2010-09-28 MED ORDER — CARVEDILOL 25 MG PO TABS: 25.0000 mg | ORAL_TABLET | Freq: Two times a day (BID) | ORAL | Status: DC

## 2010-09-28 MED ORDER — AMLODIPINE BESY-BENAZEPRIL HCL 5-20 MG PO CAPS: 1.0000 | ORAL_CAPSULE | Freq: Every day | ORAL | Status: DC

## 2010-10-24 ENCOUNTER — Other Ambulatory Visit (INDEPENDENT_AMBULATORY_CARE_PROVIDER_SITE_OTHER): Payer: Managed Care, Other (non HMO)

## 2010-10-24 ENCOUNTER — Other Ambulatory Visit: Payer: Self-pay | Admitting: Internal Medicine

## 2010-10-24 DIAGNOSIS — Z79899 Other long term (current) drug therapy: Secondary | ICD-10-CM

## 2010-10-24 LAB — URINALYSIS, ROUTINE W REFLEX MICROSCOPIC
Bilirubin Urine: NEGATIVE
Hgb urine dipstick: NEGATIVE
Ketones, ur: NEGATIVE
Leukocytes, UA: NEGATIVE
Nitrite: NEGATIVE
Specific Gravity, Urine: <=1.005 (ref 1.000–1.030)
Total Protein, Urine: NEGATIVE
Urine Glucose: NEGATIVE
Urobilinogen, UA: 0.2 (ref 0.0–1.0)
pH: 7 (ref 5.0–8.0)

## 2010-10-24 LAB — COMPREHENSIVE METABOLIC PANEL
ALT: 15 U/L (ref 0–53)
AST: 16 U/L (ref 0–37)
Albumin: 4.8 g/dL (ref 3.5–5.2)
Alkaline Phosphatase: 55 U/L (ref 39–117)
BUN: 16 mg/dL (ref 6–23)
CO2: 26 mEq/L (ref 19–32)
Calcium: 9.5 mg/dL (ref 8.4–10.5)
Chloride: 108 mEq/L (ref 96–112)
Creatinine, Ser: 1.3 mg/dL (ref 0.4–1.5)
GFR: 61.52 mL/min (ref >60.00)
Glucose, Bld: 96 mg/dL (ref 70–99)
Potassium: 3.9 mEq/L (ref 3.5–5.1)
Sodium: 140 mEq/L (ref 135–145)
Total Bilirubin: 1.3 mg/dL — ABNORMAL HIGH (ref 0.3–1.2)
Total Protein: 7 g/dL (ref 6.0–8.3)

## 2010-10-24 LAB — CBC WITH DIFFERENTIAL/PLATELET
Basophils Absolute: 0 10*3/uL (ref 0.0–0.1)
Basophils Relative: 0.4 % (ref 0.0–3.0)
Eosinophils Absolute: 0.3 10*3/uL (ref 0.0–0.7)
Eosinophils Relative: 3.6 % (ref 0.0–5.0)
HCT: 44.6 % (ref 39.0–52.0)
Hemoglobin: 15.3 g/dL (ref 13.0–17.0)
Lymphocytes Relative: 28.8 % (ref 12.0–46.0)
Lymphs Abs: 2.7 10*3/uL (ref 0.7–4.0)
MCHC: 34.3 g/dL (ref 30.0–36.0)
MCV: 91.4 fl (ref 78.0–100.0)
Monocytes Absolute: 0.7 10*3/uL (ref 0.1–1.0)
Monocytes Relative: 7.8 % (ref 3.0–12.0)
Neutro Abs: 5.5 10*3/uL (ref 1.4–7.7)
Neutrophils Relative %: 59.4 % (ref 43.0–77.0)
Platelets: 238 10*3/uL (ref 150.0–400.0)
RBC: 4.88 Mil/uL (ref 4.22–5.81)
RDW: 13 % (ref 11.5–14.6)
WBC: 9.2 10*3/uL (ref 4.5–10.5)

## 2010-10-24 LAB — LIPID PANEL
Cholesterol: 145 mg/dL (ref 0–200)
HDL: 35.3 mg/dL — ABNORMAL LOW (ref >39.00)
LDL Cholesterol: 90 mg/dL (ref 0–99)
Total CHOL/HDL Ratio: 4
Triglycerides: 99 mg/dL (ref 0.0–149.0)
VLDL: 19.8 mg/dL (ref 0.0–40.0)

## 2010-10-24 LAB — TSH: TSH: 1.82 u[IU]/mL (ref 0.35–5.50)

## 2010-10-24 LAB — T4, FREE: Free T4: 1.11 ng/dL (ref 0.60–1.60)

## 2010-10-24 NOTE — Progress Notes (Signed)
Per order from Claria Dice, MD,. Fax results to him at [phone].

## 2010-10-25 LAB — LITHIUM LEVEL: Lithium Lvl: 0.88 mEq/L (ref 0.80–1.40)

## 2010-10-25 LAB — INSULIN, FASTING: Insulin fasting, serum: 4 u[IU]/mL (ref 3–28)

## 2010-10-25 LAB — VALPROIC ACID LEVEL: Valproic Acid Lvl: 61 ug/mL (ref 50.0–100.0)

## 2010-10-26 LAB — DRUGS OF ABUSE SCREEN W/O ALC, ROUTINE URINE

## 2010-10-26 LAB — TOPIRAMATE LEVEL: Topiramate Lvl: 6.8 ug/mL

## 2010-10-29 LAB — LAMOTRIGINE LEVEL: Lamotrigine Lvl: 7.3 ug/mL (ref 4.0–18.0)

## 2010-11-04 ENCOUNTER — Telehealth: Payer: Self-pay | Admitting: Internal Medicine

## 2010-11-04 DIAGNOSIS — Z79899 Other long term (current) drug therapy: Secondary | ICD-10-CM

## 2010-11-04 NOTE — Telephone Encounter (Signed)
Yes. Please recollect. Thx

## 2010-11-04 NOTE — Telephone Encounter (Signed)
Matthew [name], please, see if urine drug screen results are available. Thx

## 2010-11-04 NOTE — Telephone Encounter (Signed)
I spoke to [place] at [place] [facility] and she informed Gerarda Gunther in [place] lab that specimen needed to be recollected because the creatnine was low.    Gerarda Gunther will contact pt to ask him to re collect specimen.

## 2010-11-04 NOTE — Telephone Encounter (Signed)
Per Gerarda GuntherSynetta Fail in our [place] lab called and spoke to [place]  And the urine appeared to have been tampered with because creatinine was low. Do you want our lab to call pt to re collect specimen?

## 2010-11-07 NOTE — Telephone Encounter (Signed)
Pt informed to come have urine specimen recollected.   Ok to fax resulted labs to Dr. Merilynn Finland in [place]?

## 2010-11-07 NOTE — Telephone Encounter (Signed)
OK. Thx

## 2010-12-15 ENCOUNTER — Ambulatory Visit: Payer: Managed Care, Other (non HMO) | Admitting: Internal Medicine

## 2011-02-03 LAB — DIFFERENTIAL
Basophils Absolute: 0
Basophils Relative: 1
Eosinophils Absolute: 0.2
Eosinophils Relative: 2
Lymphocytes Relative: 27
Lymphs Abs: 2.3
Monocytes Absolute: 0.7
Monocytes Relative: 8
Neutro Abs: 5.3
Neutrophils Relative %: 62

## 2011-02-03 LAB — COMPREHENSIVE METABOLIC PANEL
ALT: 24
AST: 19
Albumin: 4.4
Alkaline Phosphatase: 59
BUN: 17
CO2: 27
Calcium: 9.7
Chloride: 110
Creatinine, Ser: 1.17
GFR calc Af Amer: >60
GFR calc non Af Amer: >60
Glucose, Bld: 110 — ABNORMAL HIGH
Potassium: 4.7
Sodium: 143
Total Bilirubin: 1.1
Total Protein: 6.5

## 2011-02-03 LAB — CBC
HCT: 47.2
Hemoglobin: 16.6
MCHC: 35.1
MCV: 88.7
Platelets: 217
RBC: 5.32
RDW: 12.7
WBC: 8.5

## 2011-02-03 LAB — POCT CARDIAC MARKERS
CKMB, poc: 1.1
Myoglobin, poc: 82.2
Operator id: 3206
Troponin i, poc: <0.05

## 2011-02-03 LAB — LITHIUM LEVEL: Lithium Lvl: <0.25 — ABNORMAL LOW

## 2011-02-03 LAB — APTT: aPTT: 33

## 2011-10-04 ENCOUNTER — Other Ambulatory Visit: Payer: Self-pay | Admitting: Internal Medicine

## 2012-06-05 ENCOUNTER — Other Ambulatory Visit: Payer: Self-pay | Admitting: Orthopedic Surgery

## 2012-06-05 ENCOUNTER — Other Ambulatory Visit: Payer: Self-pay | Admitting: Internal Medicine

## 2012-06-14 ENCOUNTER — Encounter (HOSPITAL_BASED_OUTPATIENT_CLINIC_OR_DEPARTMENT_OTHER): Payer: Self-pay | Admitting: *Deleted

## 2012-06-14 NOTE — Progress Notes (Signed)
To [facility] at 0815-Istat,Ekg on arrival-Npo after Mn-will take lithium,coreg with small amt only that am.

## 2012-06-17 ENCOUNTER — Other Ambulatory Visit: Payer: Self-pay

## 2012-06-17 ENCOUNTER — Encounter (HOSPITAL_BASED_OUTPATIENT_CLINIC_OR_DEPARTMENT_OTHER): Payer: Self-pay | Admitting: Anesthesiology

## 2012-06-17 ENCOUNTER — Encounter (HOSPITAL_BASED_OUTPATIENT_CLINIC_OR_DEPARTMENT_OTHER)
Admission: RE | Disposition: A | Discharge status: Home / Self Care | Payer: Self-pay | Source: Ambulatory Visit | Attending: Orthopedic Surgery

## 2012-06-17 ENCOUNTER — Encounter (HOSPITAL_BASED_OUTPATIENT_CLINIC_OR_DEPARTMENT_OTHER): Payer: Self-pay | Admitting: *Deleted

## 2012-06-17 ENCOUNTER — Ambulatory Visit (HOSPITAL_BASED_OUTPATIENT_CLINIC_OR_DEPARTMENT_OTHER)
Admission: RE | Admit: 2012-06-17 | Discharge: 2012-06-17 | Disposition: A | Discharge status: Home / Self Care | Payer: Medicare Other | Source: Ambulatory Visit | Attending: Orthopedic Surgery | Admitting: Orthopedic Surgery

## 2012-06-17 ENCOUNTER — Ambulatory Visit (HOSPITAL_BASED_OUTPATIENT_CLINIC_OR_DEPARTMENT_OTHER): Payer: Medicare Other | Admitting: Anesthesiology

## 2012-06-17 DIAGNOSIS — G4730 Sleep apnea, unspecified: Secondary | ICD-10-CM | POA: Insufficient documentation

## 2012-06-17 DIAGNOSIS — K219 Gastro-esophageal reflux disease without esophagitis: Secondary | ICD-10-CM | POA: Insufficient documentation

## 2012-06-17 DIAGNOSIS — M19049 Primary osteoarthritis, unspecified hand: Secondary | ICD-10-CM | POA: Insufficient documentation

## 2012-06-17 HISTORY — PX: FINGER ARTHROPLASTY: SHX5017

## 2012-06-17 LAB — POCT I-STAT 4, (NA,K, GLUC, HGB,HCT)
Glucose, Bld: 102 mg/dL — ABNORMAL HIGH (ref 70–99)
HCT: 44 % (ref 39.0–52.0)
Hemoglobin: 15 g/dL (ref 13.0–17.0)
Potassium: 3.8 mEq/L (ref 3.5–5.1)
Sodium: 144 mEq/L (ref 135–145)

## 2012-06-17 SURGERY — ARTHROPLASTY, FINGER
Anesthesia: General | Site: Finger | Laterality: Left | Wound class: Clean

## 2012-06-17 MED ORDER — CEFAZOLIN SODIUM-DEXTROSE 2-3 GM-% IV SOLR
2.0000 g | INTRAVENOUS | Status: AC
  Administered 2012-06-17: 2 g via INTRAVENOUS
  Filled 2012-06-17: qty 50

## 2012-06-17 MED ORDER — ACETAMINOPHEN 10 MG/ML IV SOLN
INTRAVENOUS | Status: DC | PRN
  Administered 2012-06-17: 1000 mg via INTRAVENOUS

## 2012-06-17 MED ORDER — ACETAMINOPHEN 10 MG/ML IV SOLN
1000.0000 mg | Freq: Once | INTRAVENOUS | Status: DC | PRN
  Filled 2012-06-17: qty 100

## 2012-06-17 MED ORDER — LACTATED RINGERS IV SOLN
INTRAVENOUS | Status: DC
  Filled 2012-06-17: qty 1000

## 2012-06-17 MED ORDER — DEXAMETHASONE SODIUM PHOSPHATE 4 MG/ML IJ SOLN
INTRAMUSCULAR | Status: DC | PRN
  Administered 2012-06-17: 10 mg via INTRAVENOUS

## 2012-06-17 MED ORDER — MEPERIDINE HCL 25 MG/ML IJ SOLN
6.2500 mg | INTRAMUSCULAR | Status: DC | PRN
  Filled 2012-06-17: qty 1

## 2012-06-17 MED ORDER — OXYCODONE HCL 5 MG PO TABS
5.0000 mg | ORAL_TABLET | Freq: Once | ORAL | Status: DC | PRN
Start: 2012-06-17 — End: 2012-06-17
  Filled 2012-06-17: qty 1

## 2012-06-17 MED ORDER — BUPIVACAINE-EPINEPHRINE 0.5% -1:200000 IJ SOLN
INTRAMUSCULAR | Status: DC | PRN
  Administered 2012-06-17: 10 mL

## 2012-06-17 MED ORDER — PROPOFOL 10 MG/ML IV BOLUS
INTRAVENOUS | Status: DC | PRN
  Administered 2012-06-17: 250 mg via INTRAVENOUS

## 2012-06-17 MED ORDER — OXYCODONE HCL 5 MG/5ML PO SOLN
5.0000 mg | Freq: Once | ORAL | Status: DC | PRN
  Filled 2012-06-17: qty 5

## 2012-06-17 MED ORDER — HYDROMORPHONE HCL PF 1 MG/ML IJ SOLN
0.2500 mg | INTRAMUSCULAR | Status: DC | PRN
  Administered 2012-06-17: 0.5 mg via INTRAVENOUS
  Filled 2012-06-17: qty 1

## 2012-06-17 MED ORDER — ONDANSETRON HCL 4 MG/2ML IJ SOLN
INTRAMUSCULAR | Status: DC | PRN
  Administered 2012-06-17: 4 mg via INTRAVENOUS

## 2012-06-17 MED ORDER — HYDROMORPHONE HCL PF 1 MG/ML IJ SOLN
0.5000 mg | INTRAMUSCULAR | Status: DC | PRN
  Administered 2012-06-17: 0.5 mg via INTRAVENOUS
  Filled 2012-06-17: qty 1

## 2012-06-17 MED ORDER — FENTANYL CITRATE 0.05 MG/ML IJ SOLN
INTRAMUSCULAR | Status: DC | PRN
  Administered 2012-06-17: 25 ug via INTRAVENOUS
  Administered 2012-06-17 (×2): 50 ug via INTRAVENOUS
  Administered 2012-06-17 (×3): 25 ug via INTRAVENOUS

## 2012-06-17 MED ORDER — PROMETHAZINE HCL 25 MG/ML IJ SOLN
6.2500 mg | INTRAMUSCULAR | Status: DC | PRN
  Filled 2012-06-17: qty 1

## 2012-06-17 MED ORDER — LIDOCAINE HCL (CARDIAC) 20 MG/ML IV SOLN
INTRAVENOUS | Status: DC | PRN
  Administered 2012-06-17: 100 mg via INTRAVENOUS

## 2012-06-17 MED ORDER — KETOROLAC TROMETHAMINE 30 MG/ML IJ SOLN
INTRAMUSCULAR | Status: DC | PRN
  Administered 2012-06-17: 30 mg via INTRAVENOUS

## 2012-06-17 MED ORDER — OXYCODONE-ACETAMINOPHEN 5-325 MG PO TABS: 1.0000 | ORAL_TABLET | ORAL | Status: DC | PRN

## 2012-06-17 MED ORDER — LACTATED RINGERS IV SOLN
INTRAVENOUS | Status: DC
  Administered 2012-06-17 (×2): via INTRAVENOUS
  Filled 2012-06-17: qty 1000

## 2012-06-17 MED ORDER — OXYCODONE-ACETAMINOPHEN 5-325 MG PO TABS
1.0000 | ORAL_TABLET | ORAL | Status: DC | PRN
  Filled 2012-06-17: qty 2

## 2012-06-17 MED ORDER — MIDAZOLAM HCL 5 MG/5ML IJ SOLN
INTRAMUSCULAR | Status: DC | PRN
  Administered 2012-06-17: 2 mg via INTRAVENOUS

## 2012-06-17 MED ORDER — HYDROCODONE-ACETAMINOPHEN 5-325 MG PO TABS
1.0000 | ORAL_TABLET | ORAL | Status: DC | PRN
  Filled 2012-06-17: qty 2

## 2012-06-17 SURGICAL SUPPLY — 60 items
BANDAGE COBAN STERILE 2 (GAUZE/BANDAGES/DRESSINGS) IMPLANT
BANDAGE CONFORM 2  STR LF (GAUZE/BANDAGES/DRESSINGS) IMPLANT
BANDAGE CONFORM 3  STR LF (GAUZE/BANDAGES/DRESSINGS) IMPLANT
BANDAGE GAUZE ELAST BULKY 4 IN (GAUZE/BANDAGES/DRESSINGS) ×2 IMPLANT
BLADE AVERAGE 25X9 (BLADE) ×2 IMPLANT
BLADE MINI RND TIP GREEN BEAV (BLADE) ×2 IMPLANT
BLADE OSC/SAG .038X5.5 CUT EDG (BLADE) ×2 IMPLANT
BLADE SURG 15 STRL LF DISP TIS (BLADE) ×1 IMPLANT
BLADE SURG 15 STRL SS (BLADE) ×1
BNDG COHESIVE 1X5 TAN STRL LF (GAUZE/BANDAGES/DRESSINGS) IMPLANT
BNDG COHESIVE 4X5 TAN NS LF (GAUZE/BANDAGES/DRESSINGS) ×2 IMPLANT
BNDG ESMARK 4X9 LF (GAUZE/BANDAGES/DRESSINGS) ×2 IMPLANT
CANISTER SUCTION 1200CC (MISCELLANEOUS) IMPLANT
CANISTER SUCTION 2500CC (MISCELLANEOUS) IMPLANT
CHLORAPREP W/TINT 26ML (MISCELLANEOUS) ×2 IMPLANT
CLOTH BEACON ORANGE TIMEOUT ST (SAFETY) ×2 IMPLANT
CORDS BIPOLAR (ELECTRODE) ×2 IMPLANT
COVER MAYO STAND STRL (DRAPES) ×4 IMPLANT
COVER TABLE BACK 60X90 (DRAPES) ×2 IMPLANT
DRAPE EXTREMITY T 121X128X90 (DRAPE) ×2 IMPLANT
DRAPE U-SHAPE 47X51 STRL (DRAPES) ×2 IMPLANT
DRSG EMULSION OIL 3X3 NADH (GAUZE/BANDAGES/DRESSINGS) ×4 IMPLANT
ELECT NEEDLE BLADE 2-5/6 (NEEDLE) IMPLANT
GAUZE SPONGE 4X4 12PLY STRL LF (GAUZE/BANDAGES/DRESSINGS) ×2 IMPLANT
GLOVE BIO SURGEON STRL SZ7.5 (GLOVE) ×2 IMPLANT
GLOVE INDICATOR 8.0 STRL GRN (GLOVE) ×4 IMPLANT
GOWN PREVENTION PLUS LG XLONG (DISPOSABLE) ×2 IMPLANT
GOWN PREVENTION PLUS XLARGE (GOWN DISPOSABLE) ×2 IMPLANT
KWIRE 4.0 X .062IN (WIRE) ×2 IMPLANT
LOOP VESSEL MAXI BLUE (MISCELLANEOUS) IMPLANT
LOOP VESSEL MINI RED (MISCELLANEOUS) IMPLANT
NEEDLE HYPO 25X1 1.5 SAFETY (NEEDLE) ×2 IMPLANT
NS IRRIG 500ML POUR BTL (IV SOLUTION) IMPLANT
PACK BASIN DAY SURGERY FS (CUSTOM PROCEDURE TRAY) ×2 IMPLANT
PADDING CAST ABS 3INX4YD NS (CAST SUPPLIES) ×1
PADDING CAST ABS 4INX4YD NS (CAST SUPPLIES) ×2
PADDING CAST ABS COTTON 3X4 (CAST SUPPLIES) ×1 IMPLANT
PADDING CAST ABS COTTON 4X4 ST (CAST SUPPLIES) ×2 IMPLANT
SLEEVE SCD COMPRESS KNEE MED (MISCELLANEOUS) IMPLANT
SLING ARM FOAM STRAP LRG (SOFTGOODS) IMPLANT
SLING ARM FOAM STRAP MED (SOFTGOODS) IMPLANT
SPLINT FAST PLASTER 5X30 (CAST SUPPLIES) ×1
SPLINT PLASTER CAST FAST 5X30 (CAST SUPPLIES) ×1 IMPLANT
SPLINT PLASTER CAST XFAST 3X15 (CAST SUPPLIES) IMPLANT
SPLINT PLASTER CAST XFAST 4X15 (CAST SUPPLIES) IMPLANT
SPLINT PLASTER XTRA FAST SET 4 (CAST SUPPLIES)
SPLINT PLASTER XTRA FASTSET 3X (CAST SUPPLIES)
SPONGE GAUZE 4X4 12PLY (GAUZE/BANDAGES/DRESSINGS) ×2 IMPLANT
SUT ETHIBOND 3-0 V-5 (SUTURE) ×2 IMPLANT
SUT MERSILENE 4 0 P 3 (SUTURE) IMPLANT
SUT PDS AB 2-0 CT2 27 (SUTURE) IMPLANT
SUT STEEL 4 (SUTURE) ×2 IMPLANT
SUT VIC AB 2-0 PS2 27 (SUTURE) IMPLANT
SUT VICRYL 4-0 PS2 18IN ABS (SUTURE) IMPLANT
SUT VICRYL RAPID 5 0 P 3 (SUTURE) ×2 IMPLANT
SUT VICRYL RAPIDE 4/0 PS 2 (SUTURE) ×2 IMPLANT
SYRINGE 10CC LL (SYRINGE) ×2 IMPLANT
TUBE CONNECTING 12X1/4 (SUCTIONS) IMPLANT
UNDERPAD 30X30 INCONTINENT (UNDERPADS AND DIAPERS) ×2 IMPLANT
WATER STERILE IRR 500ML POUR (IV SOLUTION) ×2 IMPLANT

## 2012-06-17 NOTE — Anesthesia Preprocedure Evaluation (Addendum)
Anesthesia Evaluation  Patient identified by MRN, date of birth, ID band Patient awake    Reviewed: Allergy & Precautions, H&P , NPO status , [name] Chart, lab work & pertinent test results, reviewed documented beta blocker date and time   Airway Mallampati: II TM Distance: >3 FB     Dental  (+) Dental Advisory Given   Pulmonary sleep apnea and Continuous Positive Airway Pressure Ventilation , Current Smoker,  breath sounds clear to auscultation        Cardiovascular hypertension, Pt. on medications and Pt. on home beta blockers Rhythm:Regular Rate:Normal     Neuro/Psych  Headaches, PSYCHIATRIC DISORDERS Anxiety    GI/Hepatic Neg liver ROS, GERD-  Medicated,(+)     substance abuse   ,   Endo/Other  negative endocrine ROS  Renal/GU negative Renal ROS     Musculoskeletal negative musculoskeletal ROS (+)   Abdominal   Peds  Hematology negative hematology ROS (+)   Anesthesia Other Findings   Reproductive/Obstetrics                          Anesthesia Physical Anesthesia Plan  ASA: III  Anesthesia Plan: General   Post-op Pain Management:    Induction: Intravenous  Airway Management Planned: LMA  Additional Equipment:   Intra-op Plan:   Post-operative Plan: Extubation in OR  Informed Consent: I have reviewed the patients History and Physical, chart, labs and discussed the procedure including the risks, benefits and alternatives for the proposed anesthesia with the patient or authorized representative who has indicated his/her understanding and acceptance.   Dental advisory given  Plan Discussed with: CRNA  Anesthesia Plan Comments:         Anesthesia Quick Evaluation

## 2012-06-17 NOTE — H&P (Signed)
Matthew [name] is an 57 y.o. male.   Chief Complaint: L thumb pain  HPI: L TMC OA, transiently responsive to non-op measures including splinting, NSAIDs, and Injection.  Past Medical History  Diagnosis Date  . Hypertension   . Bipolar I disorder, most recent episode (or current) mixed, moderate   . Migraines   . Anxiety   . Panic attacks   . Substance abuse   . GERD (gastroesophageal reflux disease)   . OSA (obstructive sleep apnea)     retested-bipap not needed    Past Surgical History  Procedure Laterality Date  . Hemorrhoid surgery  1992    Dr Earlene Plater  . Tonsillectomy      Family History  Problem Relation Age of Onset  . Depression Mother   . Hypertension Father   . Cancer Father 5    brain tumor  . Heart disease Paternal Grandmother   . Arthritis Other   . Hypertension Other    Social History:  reports that he has been smoking Pipe and Cigars.  He does not have any smokeless tobacco history on file. He reports that he does not drink alcohol or use illicit drugs.  Allergies:  Allergies  Allergen Reactions  . Sumatriptan Other (See Comments)    ( Imitrex)- dizziness  . Tramadol     Severe dizziness    Medications Prior to Admission  Medication Sig Dispense Refill  . amLODipine-benazepril (LOTREL) 5-20 MG per capsule TAKE 1 CAPSULE BY MOUTH DAILY  90 capsule  1  . aspirin 81 MG chewable tablet Chew 81 mg by mouth daily.        . carvedilol (COREG) 25 MG tablet TAKE 1 TABLET BY MOUTH TWICE DAILY WITH A MEAL  180 tablet  1  . Cholecalciferol 1000 UNITS tablet Take 1,000 Units by mouth daily.        . divalproex (DEPAKOTE) 250 MG 24 hr tablet Take 500 mg by mouth at bedtime.       Marland Kitchen lithium 300 MG capsule Take 300 mg by mouth as directed. 1 in am and 1 - 2 at bedtime      . promethazine (PHENERGAN) 50 MG tablet Take 1 tablet (50 mg total) by mouth 4 (four) times daily as needed. For nausea  60 tablet  1  . clonazePAM (KLONOPIN) 1 MG tablet Take 1 mg by mouth 2  (two) times daily as needed. For anxiety or panic attack       . meperidine (DEMEROL) 50 MG tablet Take 50 mg by mouth daily as needed. As needed for severe migraine       . naratriptan (AMERGE) 2.5 MG tablet Take 2.5 mg by mouth as needed. .       . ranitidine (ZANTAC) 150 MG capsule Take 1 capsule (150 mg total) by mouth 2 (two) times daily.  180 capsule  3    Results for orders placed during the hospital encounter of 06/17/12 (from the past 48 hour(s))  POCT I-STAT 4, (NA,K, GLUC, HGB,HCT)     Status: Abnormal   Collection Time    06/17/12  8:44 AM      Result Value Range   Sodium 144  135 - 145 mEq/L   Potassium 3.8  3.5 - 5.1 mEq/L   Glucose, Bld 102 (*) 70 - 99 mg/dL   HCT 16.1  09.6 - 04.5 %   Hemoglobin 15.0  13.0 - 17.0 g/dL   No results found.  Review of Systems  All other systems reviewed and are negative.    Blood pressure 159/97, pulse 71, temperature 96.9 F (36.1 C), temperature source Oral, resp. rate 16, height 5\' 10"  (1.778 m), weight 81.194 kg (179 lb), SpO2 97.00%. Physical Exam  Constitutional: He is oriented to person, place, and time. He appears well-developed and well-nourished.  HENT:  Head: Normocephalic and atraumatic.  Eyes: EOM are normal.  Neck: Normal range of motion.  Cardiovascular: Intact distal pulses.   Respiratory: Effort normal.  GI: Soft.  Neurological: He is alert and oriented to person, place, and time.  Skin: Skin is warm and dry.  Psychiatric: His behavior is normal. Judgment and thought content normal.  Musculoskeletal: Left TMC joint tender to palpation, exquisite pain with TMC stress and grind, no hyperextension at the MP joint.  Assessment/Plan Left thumb TMC osteoarthritis Discussed again the details of thumb suspension arthroplasty with the patient in the presence of his wife. Goals, risks, and options were reviewed briefly. Informed consent was obtained. We will proceed as planned.  Bena Kobel A. 06/17/2012, 9:50  AM

## 2012-06-17 NOTE — Transfer of Care (Signed)
Immediate Anesthesia Transfer of Care Note  Patient: Matthew [name]  Procedure(s) Performed: Procedure(s) (LRB): FINGER ARTHROPLASTY (Left)  Patient Location: PACU  Anesthesia Type: General  Level of Consciousness: awake, sedated, patient cooperative and responds to stimulation  Airway & Oxygen Therapy: Patient Spontanous Breathing and Patient connected to face mask oxygen  Post-op Assessment: Report given to PACU RN, Post -op Vital signs reviewed and stable and Patient moving all extremities  Post vital signs: Reviewed and stable  Complications: No apparent anesthesia complications

## 2012-06-17 NOTE — Progress Notes (Signed)
Reported to M. Melvyn Neth, RN for lunch relief

## 2012-06-17 NOTE — Anesthesia Procedure Notes (Signed)
Procedure Name: LMA Insertion Date/Time: 06/17/2012 10:16 AM Performed by: Norva Pavlov Pre-anesthesia Checklist: Patient identified, Emergency Drugs available, Suction available and Patient being monitored Patient Re-evaluated:Patient Re-evaluated prior to inductionOxygen Delivery Method: Circle System Utilized Preoxygenation: Pre-oxygenation with 100% oxygen Intubation Type: IV induction Ventilation: Mask ventilation without difficulty LMA: LMA inserted LMA Size: 4.0 Number of attempts: 1 Airway Equipment and Method: bite block Placement Confirmation: positive ETCO2 Tube secured with: Tape Dental Injury: Teeth and Oropharynx as per pre-operative assessment

## 2012-06-17 NOTE — Op Note (Signed)
06/17/2012  11:44 AM  PATIENT:  Matthew [name]  57 y.o. male  PRE-OPERATIVE DIAGNOSIS:  Left thumb TMC osteoarthritis  POST-OPERATIVE DIAGNOSIS:  Same  PROCEDURE:  Left thumb suspension arthroplasty (LRTI)  SURGEON: Cliffton Asters. Janee Morn, MD  PHYSICIAN ASSISTANT: None  ANESTHESIA:  general  SPECIMENS:  None  DRAINS:   None  PREOPERATIVE INDICATIONS:  Matthew [name] is a  57 y.o. male with a diagnosis of left TMC osteoarthritis who failed conservative measures and elected for surgical management.    The risks benefits and alternatives were discussed with the patient preoperatively including but not limited to the risks of infection, bleeding, nerve injury, cardiopulmonary complications, the need for revision surgery, among others, and the patient verbalized understanding and consented to proceed.  OPERATIVE IMPLANTS: None  OPERATIVE FINDINGS: One quarter of TMC joint with grade 4 osteoarthritis, grade 1-2 change at the scaphoid trapezoid joint  OPERATIVE PROCEDURE:  After receiving prophylactic antibiotics, the patient was escorted to the operative theatre and placed in a supine position.  General anesthesia was a Optician, dispensing. A surgical "time-out" was performed during which the planned procedure, proposed operative site, and the correct patient identity were compared to the operative consent and agreement confirmed by the circulating nurse according to current facility policy.  Following application of a tourniquet to the operative extremity, the exposed skin was prepped with Chloraprep and draped in the usual sterile fashion.  The limb was exsanguinated with an Esmarch bandage and the tourniquet inflated to approximately higher than systolic BP.  A Wagner incision was created sharply with a scalpel. Subcutaneous structures to include branches of the superficial radial nerve were elevated in a full-thickness flap and retracted. The thenar muscles were dissected extra  periosteally. The TMC capsule was opened longitudinally at the volar margin of the APL tendon. It was peelback radially and ulnarly off of the trapezium and the trapezium was excised piecemeal. The scaphotrapezoid joint was inspected and found to be acceptable with no further treatment. The FCR tendon was intact. The base of the metacarpal was resected with an oscillating saw. A hole was then placed in the metacarpal for passage of the tendon from a ligament reconstruction. A half strip of the FCR was harvested in a standard fashion through a separate oblique incision in the midforearm. It was passed through the hole in the metacarpal and secured to the dorsal periosteum with 3-0 Ethibond suture. The resection space was copiously irrigated. Traction was not applied to the thumb and the FCR tendon was brought back down into the resection space through the dorsal capsule, looped around the intact portion of the FCR, and tied and a half hitch upon itself. The knot was secured with 3-0 Ethibond suture to prevent it from unraveling. Using a suture that had been placed deep in the midst of the resection space, the knot in the FCR plus the remaining bit of tendon was fanfolded onto the needle and secured with 3-0 Ethibond into the resection space as more interposition material.  The capsule was then closed with 3-0 Ethibond interrupted and running suture. Tourniquet was released and additional hemostasis obtained. The skin was all closed with 4-0 Vicryl Rapide interrupted suture. Local anesthetic consisting of half percent Marcaine with epinephrine was used to provide postoperative pain control, infiltrated around the incision as well as in the interposition space. A bulky hand dressing with a thumb spica plaster component was applied and the patient was awakened and taken to the recovery area in stable  condition.  DISPOSITION: The patient will be discharged home today with typical postop instructions returning in 10-15  days for reassessment. No x-rays required. He should have a follow-on appointment with hand therapy to have a custom molded forearm-based thumb spica splint constructed and begin appropriate rehabilitation.

## 2012-06-18 ENCOUNTER — Encounter (HOSPITAL_BASED_OUTPATIENT_CLINIC_OR_DEPARTMENT_OTHER): Payer: Self-pay | Admitting: Orthopedic Surgery

## 2013-03-06 ENCOUNTER — Other Ambulatory Visit: Payer: Self-pay

## 2018-11-26 ENCOUNTER — Encounter: Payer: Self-pay | Admitting: Podiatry

## 2018-11-26 ENCOUNTER — Other Ambulatory Visit: Payer: Self-pay

## 2018-11-26 ENCOUNTER — Ambulatory Visit (INDEPENDENT_AMBULATORY_CARE_PROVIDER_SITE_OTHER): Payer: Medicare Other | Admitting: Podiatry

## 2018-11-26 VITALS — Temp 98.2°F

## 2018-11-26 DIAGNOSIS — L600 Ingrowing nail: Secondary | ICD-10-CM

## 2018-11-26 NOTE — Patient Instructions (Signed)

## 2018-11-29 NOTE — Progress Notes (Signed)
Subjective:   Patient ID: Matthew [name], male   DOB: 63 y.o.   MRN: [id number]   HPI 63 year old male presents the office with concerns of ingrown toenails of the right big toe.  He says the area is becoming tender.  He said a chronic issue with ingrown toenails.  He has an occasional redness at times but no drainage or pus no red streaks.  He does a lot of fly fishing.    Review of Systems  All other systems reviewed and are negative.  Past Medical History:  Diagnosis Date  . Anxiety   . Bipolar I disorder, most recent episode (or current) mixed, moderate   . GERD (gastroesophageal reflux disease)   . Hypertension   . Migraines   . OSA (obstructive sleep apnea)    retested-bipap not needed  . Panic attacks   . Substance abuse [facility])     Past Surgical History:  Procedure Laterality Date  . FINGER ARTHROPLASTY Left 06/17/2012   Procedure: FINGER ARTHROPLASTY;  Surgeon: Jolyn Nap, MD;  Location: [facility];  Service: Orthopedics;  Laterality: Left;  left thumb suspension arthroplasty   . HEMORRHOID SURGERY  1992   Dr Rosana Hoes  . TONSILLECTOMY       Current Outpatient Medications:  .  amLODipine-benazepril (LOTREL) 5-20 MG per capsule, TAKE 1 CAPSULE BY MOUTH DAILY, Disp: 90 capsule, Rfl: 1 .  carvedilol (COREG) 25 MG tablet, TAKE 1 TABLET BY MOUTH TWICE DAILY WITH A MEAL, Disp: 180 tablet, Rfl: 1 .  lamoTRIgine (LAMICTAL) 200 MG tablet, , Disp: , Rfl:  .  lithium 300 MG capsule, Take 300 mg by mouth as directed. 1 in am and 1 - 2 at bedtime, Disp: , Rfl:  .  naratriptan (AMERGE) 2.5 MG tablet, Take 2.5 mg by mouth as needed. . , Disp: , Rfl:  .  rOPINIRole (REQUIP) 1 MG tablet, , Disp: , Rfl:  .  topiramate (TOPAMAX) 200 MG tablet, , Disp: , Rfl:   Allergies  Allergen Reactions  . Sumatriptan Other (See Comments)    ( Imitrex)- dizziness  . Tramadol     Severe dizziness         Objective:  Physical Exam  General: AAO x3,  NAD  Dermatological: Incurvation present on the both medial lateral aspect the right hallux toenail with tenderness palpation.  There is localized edema and mild erythema but no ascending cellulitis.  No drainage or pus.  Tenderness palpation of the nail borders.  Vascular: Dorsalis Pedis artery and Posterior Tibial artery pedal pulses are 2/4 bilateral with immedate capillary fill time.  There is no pain with calf compression, swelling, warmth, erythema.   Neruologic: Grossly intact via light touch bilateral.   Musculoskeletal: No gross boney pedal deformities bilateral. No pain, crepitus, or limitation noted with foot and ankle range of motion bilateral. Muscular strength 5/5 in all groups tested bilateral.  Gait: Unassisted, Nonantalgic.       Assessment:   Ingrown toenail right hallux    Plan:  -Treatment options discussed including all alternatives, risks, and complications -Etiology of symptoms were discussed -At this time, the patient is requesting partial nail removal with chemical matricectomy to the symptomatic portion of the nail. Risks and complications were discussed with the patient for which they understand and written consent was obtained. Under sterile conditions a total of 3 mL of a mixture of 2% lidocaine plain and 0.5% Marcaine plain was infiltrated in a hallux block fashion. Once anesthetized,  the skin was prepped in sterile fashion. A tourniquet was then applied. Next the medial/lateral aspect of hallux nail border was then sharply excised making sure to remove the entire offending nail border. Once the nails were ensured to be removed area was debrided and the underlying skin was intact. There is no purulence identified in the procedure. Next phenol was then applied under standard conditions and copiously irrigated. Silvadene was applied. A dry sterile dressing was applied. After application of the dressing the tourniquet was removed and there is found to be an immediate  capillary refill time to the digit. The patient tolerated the procedure well any complications. Post procedure instructions were discussed the patient for which he verbally understood. Follow-up in one week for nail check or sooner if any problems are to arise. Discussed signs/symptoms of infection and directed to call the office immediately should any occur or go directly to the emergency room. In the meantime, encouraged to call the office with any questions, concerns, changes symptoms.  Vivi BarrackMatthew R Naiomi Musto DPM

## 2018-12-03 ENCOUNTER — Other Ambulatory Visit: Payer: Self-pay

## 2018-12-03 ENCOUNTER — Ambulatory Visit: Payer: Self-pay | Admitting: Podiatry

## 2018-12-03 ENCOUNTER — Encounter: Payer: Self-pay | Admitting: Podiatry

## 2018-12-03 VITALS — Temp 97.3°F

## 2018-12-03 DIAGNOSIS — L600 Ingrowing nail: Secondary | ICD-10-CM

## 2018-12-03 NOTE — Patient Instructions (Signed)

## 2018-12-12 NOTE — Progress Notes (Signed)
Subjective: Matthew [name] is a 63 y.o.  Male returns to office today for follow up evaluation after having right Hallux partial nail avulsion performed. Patient has been soaking using epsom salts and applying topical antibiotic covered with bandaid daily.  He denies any drainage or any redness or red streaks.  No pain.  Patient denies fevers, chills, nausea, vomiting. Denies any calf pain, chest pain, SOB.   Objective:  Vitals: Reviewed  General: Well developed, nourished, in no acute distress, alert and oriented x3   Dermatology: Skin is warm, dry and supple bilateral.  Right hallux nail border appears to be clean, dry, with mild granular tissue and surrounding scab.  Minimal surrounding edema and erythema but overall improved.  There is no drainage or pus.  No fluctuation or crepitation and there is no ascending cellulitis.  The remaining nails appear unremarkable at this time. There are no other lesions or other signs of infection present.  Neurovascular status: Intact. No lower extremity swelling; No pain with calf compression bilateral.  Musculoskeletal: No tenderness to palpation of the right hallux nail fold. Muscular strength within normal limits bilateral.   Assesement and Plan: S/p partial nail avulsion, doing well.   -Continue soaking in epsom salts twice a day followed by antibiotic ointment and a band-aid. Can leave uncovered at night. Continue this until completely healed.  -If the area has not healed in 2 weeks, call the office for follow-up appointment, or sooner if any problems arise.  -Monitor for any signs/symptoms of infection. Call the office immediately if any occur or go directly to the emergency room. Call with any questions/concerns.  Celesta Gentile, DPM

## 2019-01-14 ENCOUNTER — Encounter

## 2019-01-14 ENCOUNTER — Ambulatory Visit: Payer: Medicare Other | Admitting: Podiatry

## 2019-06-02 ENCOUNTER — Ambulatory Visit: Payer: Medicare Other

## 2019-06-08 ENCOUNTER — Ambulatory Visit: Payer: Medicare Other | Attending: Internal Medicine

## 2019-07-05 ENCOUNTER — Ambulatory Visit: Payer: Medicare Other | Attending: Internal Medicine

## 2019-07-05 DIAGNOSIS — Z23 Encounter for immunization: Secondary | ICD-10-CM | POA: Insufficient documentation

## 2019-07-05 NOTE — Progress Notes (Signed)
   Covid-19 Vaccination Clinic  Name:  Matthew [name]    MRN: [id number] DOB: 1955-10-21  07/05/2019  Matthew [name] was observed post Covid-19 immunization for 15 minutes without incident. He was provided with Vaccine Information Sheet and instruction to access the V-Safe system.   Matthew [name] was instructed to call 911 with any severe reactions post vaccine: Marland Kitchen Difficulty breathing  . Swelling of face and throat  . A fast heartbeat  . A bad rash all over body  . Dizziness and weakness   Immunizations Administered    Name Date Dose VIS Date Route   Pfizer COVID-19 Vaccine 07/05/2019  7:08 PM 0.3 mL 04/11/2019 Intramuscular   Manufacturer: [employer], [employer]   Lot: [id number]   NDC: [address]-9

## 2019-07-26 ENCOUNTER — Ambulatory Visit: Payer: Medicare Other | Attending: Internal Medicine

## 2019-07-26 DIAGNOSIS — Z23 Encounter for immunization: Secondary | ICD-10-CM

## 2019-07-26 NOTE — Progress Notes (Signed)
   Covid-19 Vaccination Clinic  Name:  MICHAEL WALRATH    MRN: [id number] DOB: May 03, 1955  07/26/2019  Mr. Ybarra was observed post Covid-19 immunization for 15 minutes without incident. He was provided with Vaccine Information Sheet and instruction to access the V-Safe system.   Mr. Klus was instructed to call 911 with any severe reactions post vaccine: Marland Kitchen Difficulty breathing  . Swelling of face and throat  . A fast heartbeat  . A bad rash all over body  . Dizziness and weakness   Immunizations Administered    Name Date Dose VIS Date Route   Pfizer COVID-19 Vaccine 07/26/2019  2:00 PM 0.3 mL 04/11/2019 Intramuscular   Manufacturer: [employer], [employer]   Lot: [id number]   NDC: [address]-9

## 2019-08-06 ENCOUNTER — Ambulatory Visit: Payer: Medicare Other

## 2020-05-04 ENCOUNTER — Other Ambulatory Visit: Payer: Self-pay

## 2020-05-04 ENCOUNTER — Observation Stay (HOSPITAL_COMMUNITY)
Admission: EM | Admit: 2020-05-04 | Discharge: 2020-05-05 | Disposition: A | Discharge status: Home / Self Care | Payer: Medicare Other | Attending: Internal Medicine | Admitting: Internal Medicine

## 2020-05-04 ENCOUNTER — Emergency Department (HOSPITAL_COMMUNITY): Payer: Medicare Other

## 2020-05-04 DIAGNOSIS — R42 Dizziness and giddiness: Secondary | ICD-10-CM | POA: Diagnosis not present

## 2020-05-04 DIAGNOSIS — R6889 Other general symptoms and signs: Secondary | ICD-10-CM | POA: Diagnosis not present

## 2020-05-04 DIAGNOSIS — R21 Rash and other nonspecific skin eruption: Secondary | ICD-10-CM | POA: Diagnosis not present

## 2020-05-04 DIAGNOSIS — S0511XA Contusion of eyeball and orbital tissues, right eye, initial encounter: Secondary | ICD-10-CM

## 2020-05-04 DIAGNOSIS — F17290 Nicotine dependence, other tobacco product, uncomplicated: Secondary | ICD-10-CM | POA: Diagnosis not present

## 2020-05-04 DIAGNOSIS — S0083XA Contusion of other part of head, initial encounter: Secondary | ICD-10-CM | POA: Diagnosis not present

## 2020-05-04 DIAGNOSIS — I10 Essential (primary) hypertension: Secondary | ICD-10-CM | POA: Diagnosis not present

## 2020-05-04 DIAGNOSIS — S0011XA Contusion of right eyelid and periocular area, initial encounter: Secondary | ICD-10-CM | POA: Diagnosis not present

## 2020-05-04 DIAGNOSIS — R220 Localized swelling, mass and lump, head: Secondary | ICD-10-CM | POA: Diagnosis not present

## 2020-05-04 DIAGNOSIS — W01198A Fall on same level from slipping, tripping and stumbling with subsequent striking against other object, initial encounter: Secondary | ICD-10-CM | POA: Insufficient documentation

## 2020-05-04 DIAGNOSIS — R000 Tachycardia, unspecified: Secondary | ICD-10-CM | POA: Diagnosis not present

## 2020-05-04 DIAGNOSIS — Y92001 Dining room of unspecified non-institutional (private) residence as the place of occurrence of the external cause: Secondary | ICD-10-CM | POA: Insufficient documentation

## 2020-05-04 DIAGNOSIS — M47812 Spondylosis without myelopathy or radiculopathy, cervical region: Secondary | ICD-10-CM | POA: Diagnosis not present

## 2020-05-04 DIAGNOSIS — R569 Unspecified convulsions: Secondary | ICD-10-CM

## 2020-05-04 DIAGNOSIS — W19XXXA Unspecified fall, initial encounter: Secondary | ICD-10-CM | POA: Diagnosis not present

## 2020-05-04 DIAGNOSIS — Z20822 Contact with and (suspected) exposure to covid-19: Secondary | ICD-10-CM | POA: Diagnosis not present

## 2020-05-04 DIAGNOSIS — Z743 Need for continuous supervision: Secondary | ICD-10-CM | POA: Diagnosis not present

## 2020-05-04 DIAGNOSIS — F319 Bipolar disorder, unspecified: Secondary | ICD-10-CM | POA: Diagnosis present

## 2020-05-04 DIAGNOSIS — G40909 Epilepsy, unspecified, not intractable, without status epilepticus: Principal | ICD-10-CM | POA: Insufficient documentation

## 2020-05-04 DIAGNOSIS — H5789 Other specified disorders of eye and adnexa: Secondary | ICD-10-CM

## 2020-05-04 DIAGNOSIS — I499 Cardiac arrhythmia, unspecified: Secondary | ICD-10-CM | POA: Diagnosis not present

## 2020-05-04 DIAGNOSIS — J3489 Other specified disorders of nose and nasal sinuses: Secondary | ICD-10-CM | POA: Diagnosis not present

## 2020-05-04 DIAGNOSIS — Z79899 Other long term (current) drug therapy: Secondary | ICD-10-CM | POA: Diagnosis not present

## 2020-05-04 DIAGNOSIS — M47813 Spondylosis without myelopathy or radiculopathy, cervicothoracic region: Secondary | ICD-10-CM | POA: Diagnosis not present

## 2020-05-04 DIAGNOSIS — R4182 Altered mental status, unspecified: Secondary | ICD-10-CM | POA: Diagnosis present

## 2020-05-04 LAB — COMPREHENSIVE METABOLIC PANEL
ALT: 14 U/L (ref 0–44)
AST: 20 U/L (ref 15–41)
Albumin: 4.5 g/dL (ref 3.5–5.0)
Alkaline Phosphatase: 57 U/L (ref 38–126)
Anion gap: 13 (ref 5–15)
BUN: 15 mg/dL (ref 8–23)
CO2: 20 mmol/L — ABNORMAL LOW (ref 22–32)
Calcium: 9.3 mg/dL (ref 8.9–10.3)
Chloride: 102 mmol/L (ref 98–111)
Creatinine, Ser: 1.08 mg/dL (ref 0.61–1.24)
GFR, Estimated: >60 mL/min (ref >60)
Glucose, Bld: 173 mg/dL — ABNORMAL HIGH (ref 70–99)
Potassium: 3.9 mmol/L (ref 3.5–5.1)
Sodium: 135 mmol/L (ref 135–145)
Total Bilirubin: 1.2 mg/dL (ref 0.3–1.2)
Total Protein: 7 g/dL (ref 6.5–8.1)

## 2020-05-04 LAB — RESP PANEL BY RT-PCR (FLU A&B, COVID) ARPGX2
Influenza A by PCR: NEGATIVE
Influenza B by PCR: NEGATIVE
SARS Coronavirus 2 by RT PCR: NEGATIVE

## 2020-05-04 LAB — CBC WITH DIFFERENTIAL/PLATELET
Abs Immature Granulocytes: 0.06 10*3/uL (ref 0.00–0.07)
Basophils Absolute: 0 10*3/uL (ref 0.0–0.1)
Basophils Relative: 0 %
Eosinophils Absolute: 0 10*3/uL (ref 0.0–0.5)
Eosinophils Relative: 0 %
HCT: 48.2 % (ref 39.0–52.0)
Hemoglobin: 16 g/dL (ref 13.0–17.0)
Immature Granulocytes: 1 %
Lymphocytes Relative: 6 %
Lymphs Abs: 0.7 10*3/uL (ref 0.7–4.0)
MCH: 29.6 pg (ref 26.0–34.0)
MCHC: 33.2 g/dL (ref 30.0–36.0)
MCV: 89.3 fL (ref 80.0–100.0)
Monocytes Absolute: 0.4 10*3/uL (ref 0.1–1.0)
Monocytes Relative: 4 %
Neutro Abs: 9.2 10*3/uL — ABNORMAL HIGH (ref 1.7–7.7)
Neutrophils Relative %: 89 %
Platelets: 237 10*3/uL (ref 150–400)
RBC: 5.4 MIL/uL (ref 4.22–5.81)
RDW: 12.2 % (ref 11.5–15.5)
WBC: 10.3 10*3/uL (ref 4.0–10.5)
nRBC: 0 % (ref 0.0–0.2)

## 2020-05-04 LAB — LITHIUM LEVEL: Lithium Lvl: <0.06 mmol/L — ABNORMAL LOW (ref 0.60–1.20)

## 2020-05-04 LAB — ETHANOL: Alcohol, Ethyl (B): <10 mg/dL (ref <10)

## 2020-05-04 LAB — AMMONIA: Ammonia: 37 umol/L — ABNORMAL HIGH (ref 9–35)

## 2020-05-04 IMAGING — CT CT HEAD W/O CM
4 of 6 series · 17 of 47 positions shown, 19 images · non-contrast
Comparison: None.

CLINICAL DATA: Altered level of consciousness, dizziness for
several days, fell

EXAM:
CT HEAD WITHOUT CONTRAST
TECHNIQUE: Contiguous axial images were obtained from the base of the skull
through the vertex without intravenous contrast.

[Series 5: head wo · axial · 0.48mm/px · z∈[+1045,+1170]mm · 6 of 35 slices shown, 8 images (1 of 2)]
[im 5/35  brain]
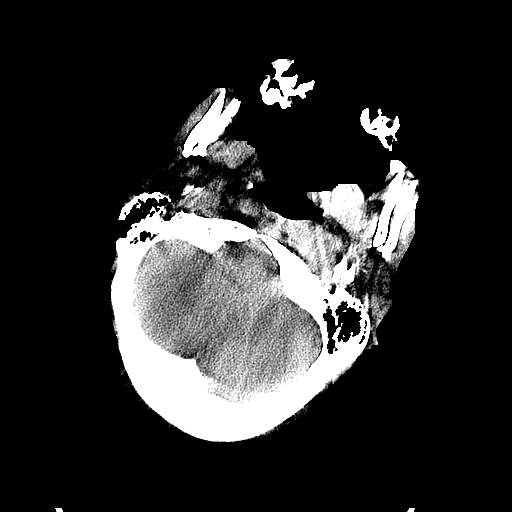
[im 5/35  bone]
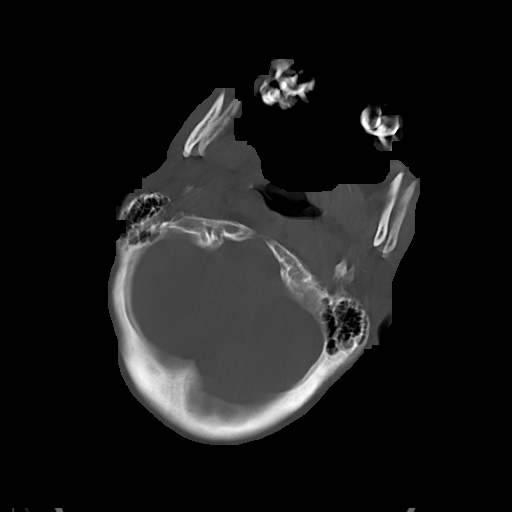
[im 10/35  brain]
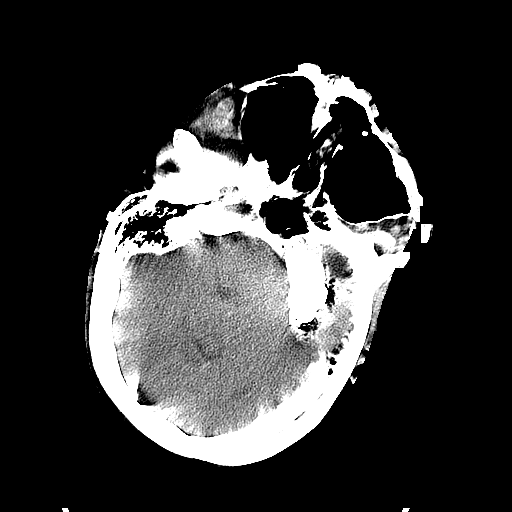
[im 15/35  brain]
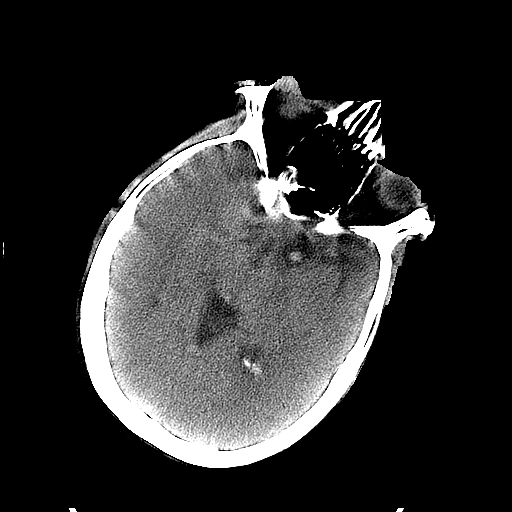
[im 20/35  brain]
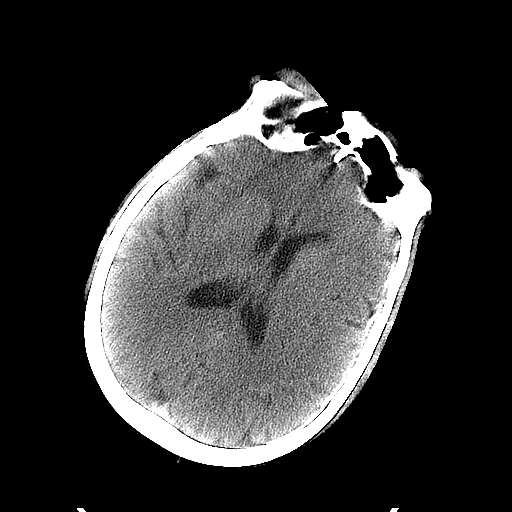
[im 25/35  brain]
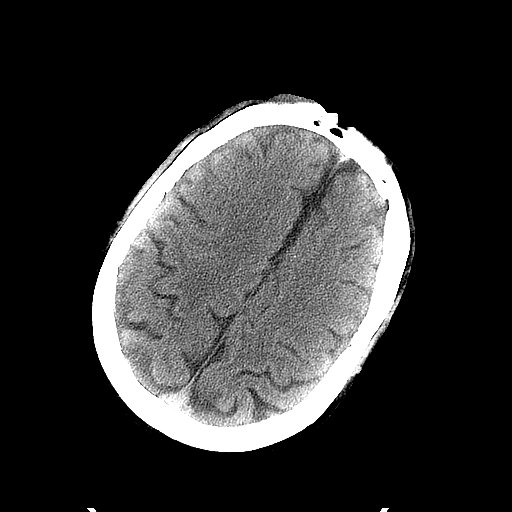
[im 25/35  bone]
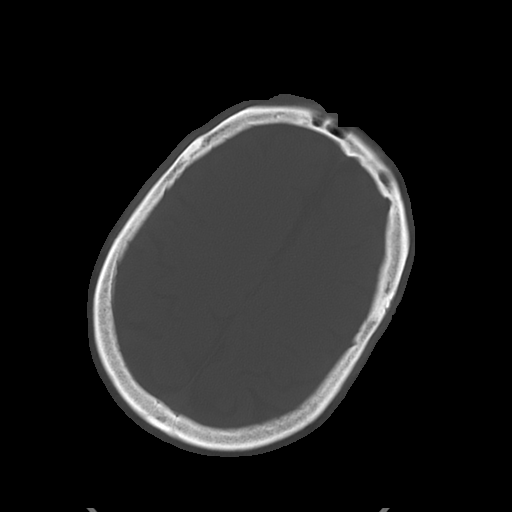
[im 30/35  brain]
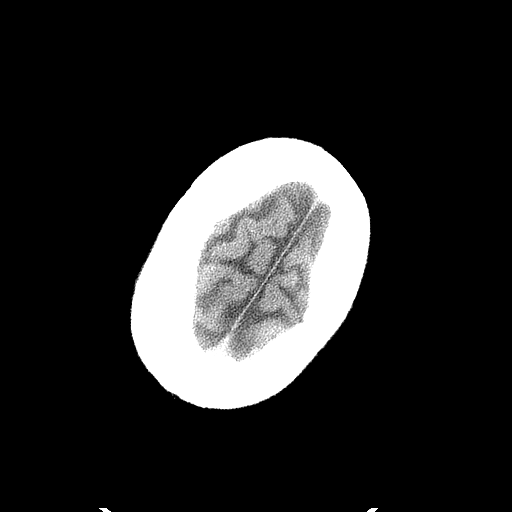

[Series 6: sag · sagittal · 0.37mm/px · 3 of 37 slices shown]
[im 13/37  brain]
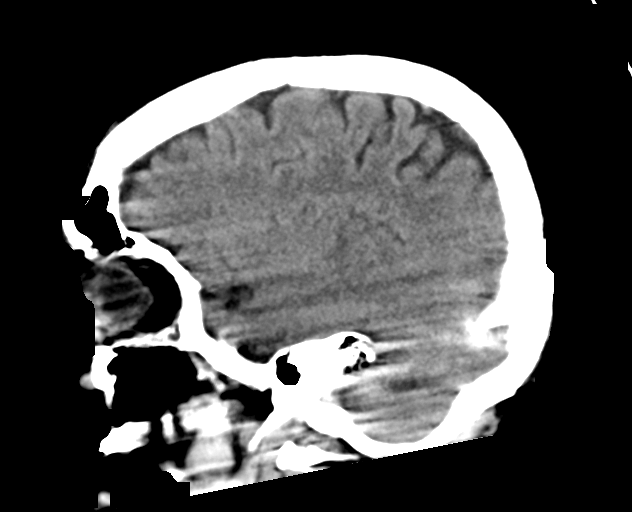
[im 19/37  brain]
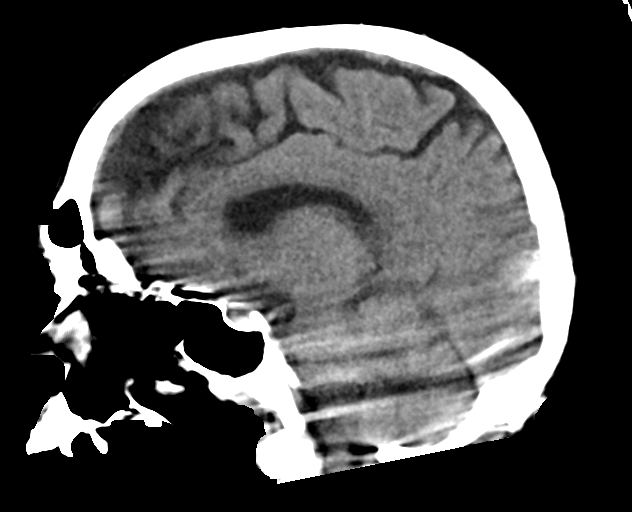
[im 25/37  brain]
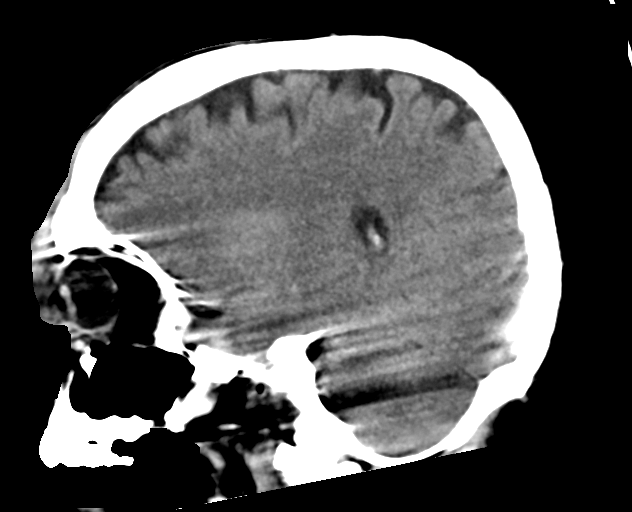

[Series 8: head wo · axial · 0.48mm/px · z∈[+1045,+1145]mm · 5 of 35 slices shown (2 of 2)]
[im 5/35  brain]
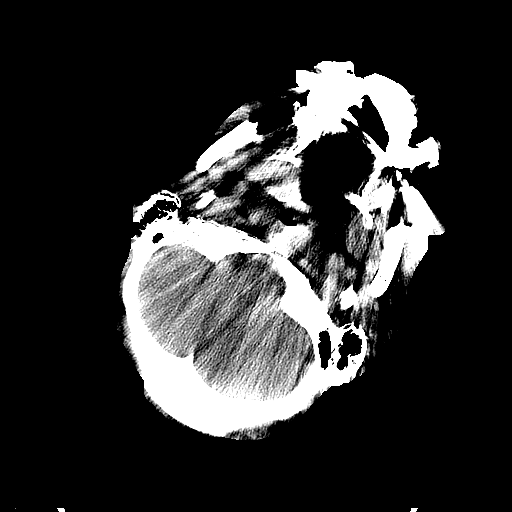
[im 10/35  brain]
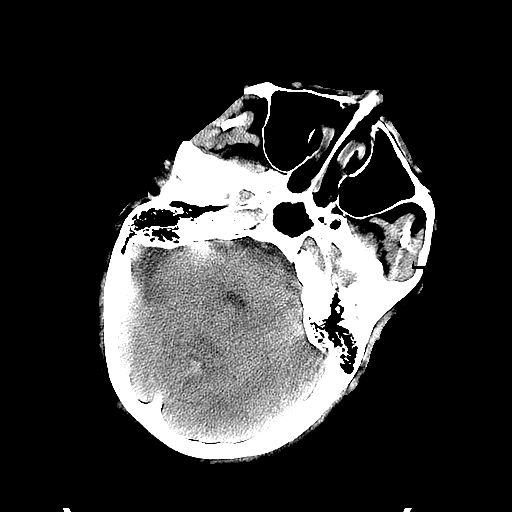
[im 15/35  brain]
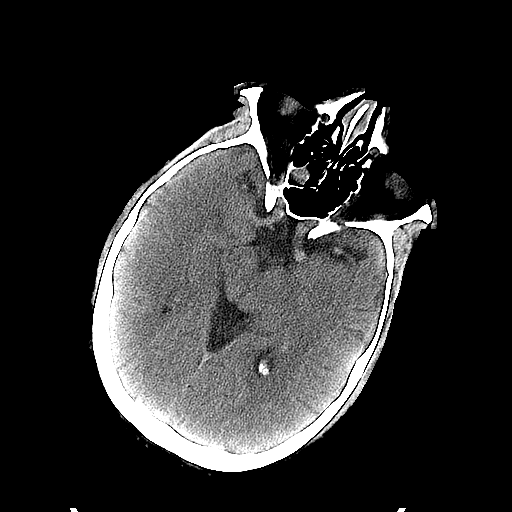
[im 20/35  brain]
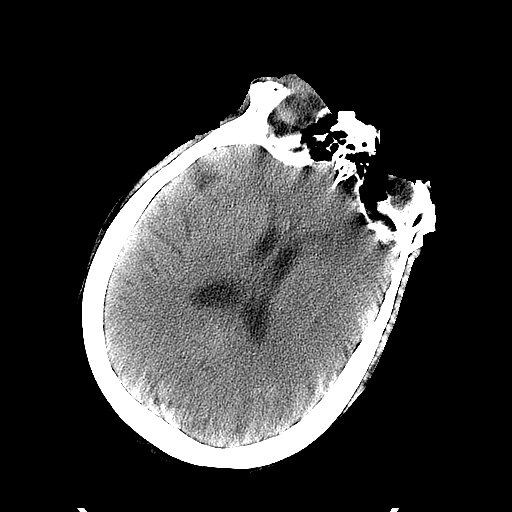
[im 25/35  brain]
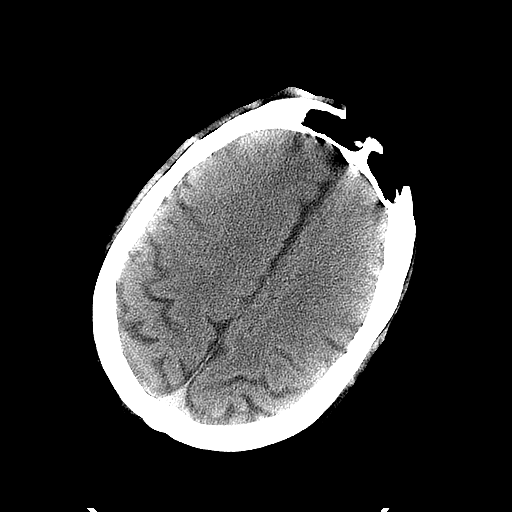

[Series 10: cor · coronal · 0.36mm/px · 3 of 64 slices shown]
[im 22/64  brain]
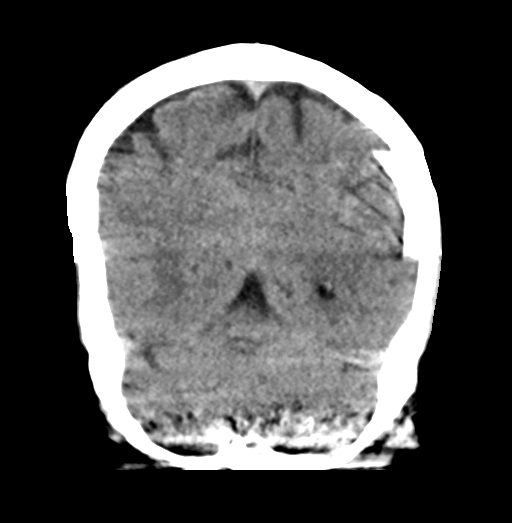
[im 29/64  brain]
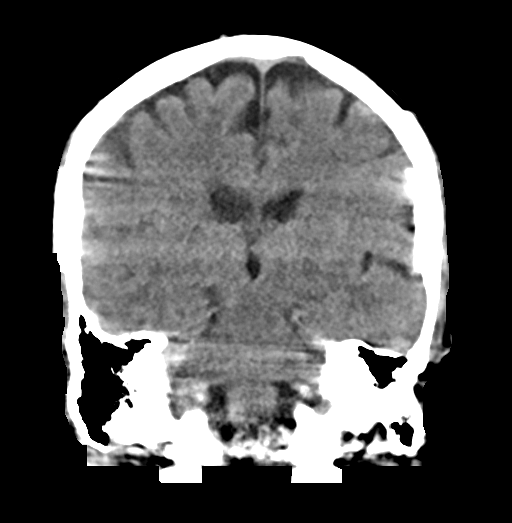
[im 36/64  brain]
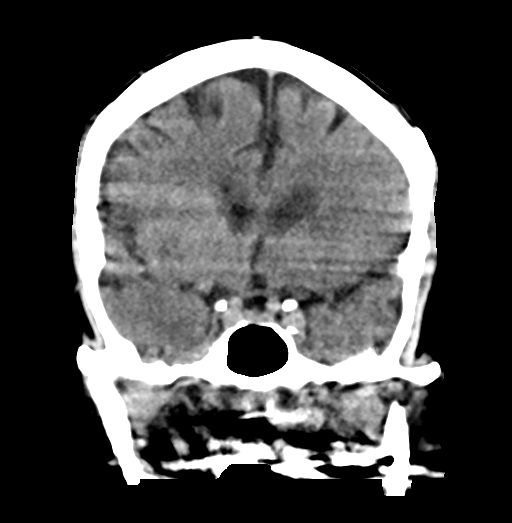

[17 of 47 positions shown; findings below may reference images not displayed]

FINDINGS: Brain: Patient motion limits evaluation. No signs of acute infarct
or hemorrhage. Lateral ventricles and midline structures are
unremarkable. No acute extra-axial fluid collections. No mass
effect.

Vascular: No hyperdense vessel or unexpected calcification.

Skull: Large right supraorbital scalp hematoma. No underlying
fracture. The remainder of the calvarium is unremarkable.

Sinuses/Orbits: No acute finding.

Other: None.
IMPRESSION: 1. Study limited by patient motion.
2. Right frontal scalp hematoma.
3. No acute intracranial process.

## 2020-05-04 IMAGING — CT CT MAXILLOFACIAL W/O CM
3 of 6 series · 15 of 47 positions shown, 18 images · non-contrast
Comparison: None.

CLINICAL DATA: Dizziness, altered level of consciousness, right
periorbital hematoma

EXAM:
CT MAXILLOFACIAL WITHOUT CONTRAST
TECHNIQUE: Multidetector CT imaging of the maxillofacial structures was
performed. Multiplanar CT image reconstructions were also generated.

[Series 1: maxilllofacial 2.0 hr40 3 · axial · 0.45mm/px · z∈[+995,+1139]mm · 10 of 84 slices shown, 13 images]
[im 6/84  brain]
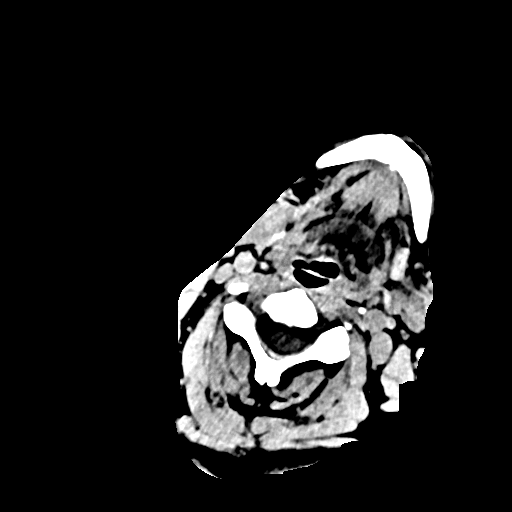
[im 6/84  bone]
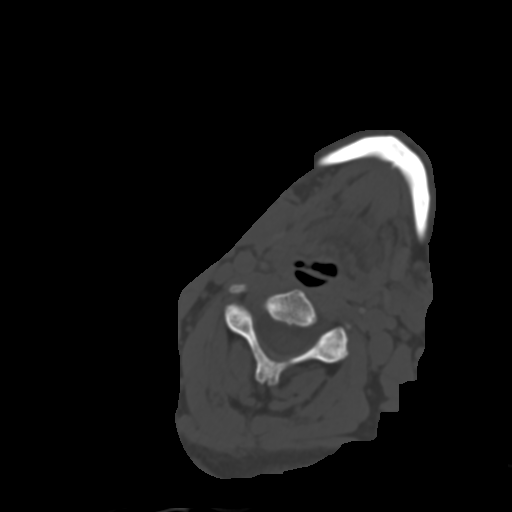
[im 12/84  bone]
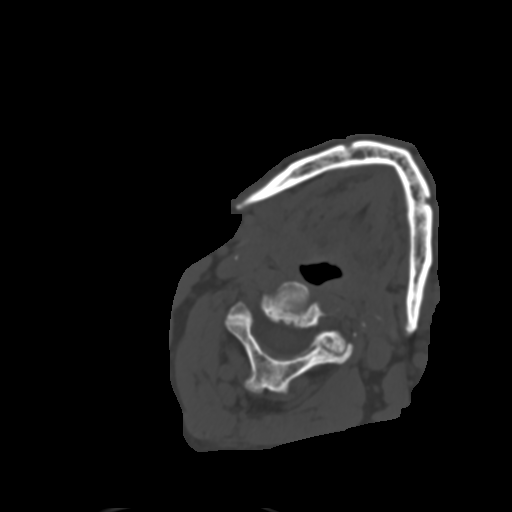
[im 24/84  bone]
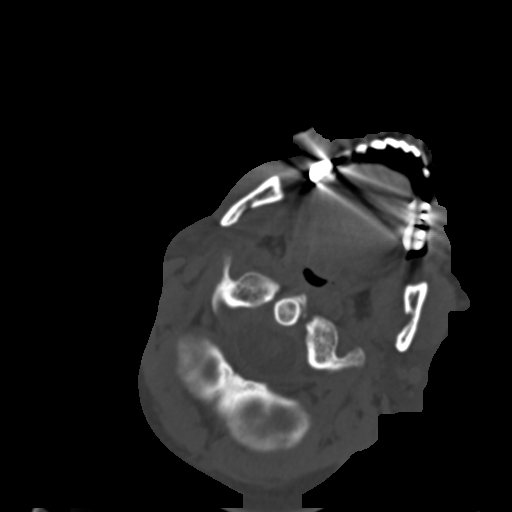
[im 30/84  bone]
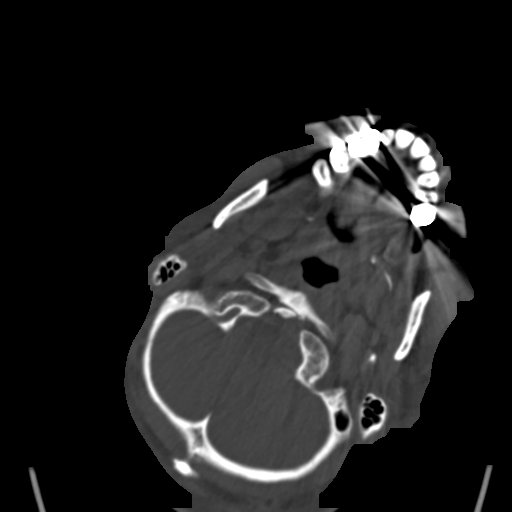
[im 36/84  brain]
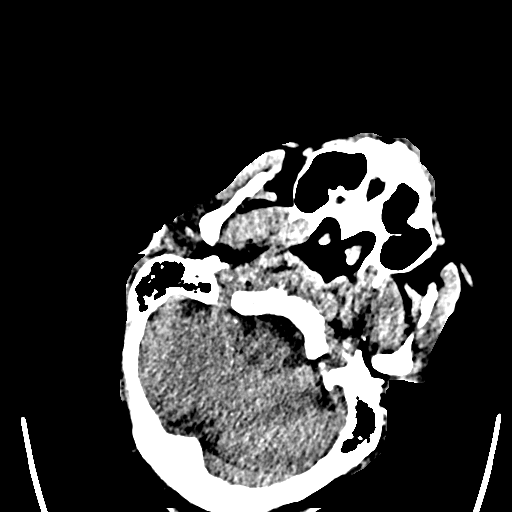
[im 36/84  bone]
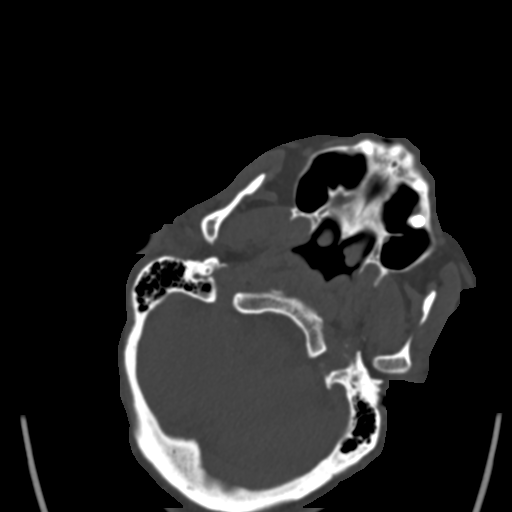
[im 48/84  bone]
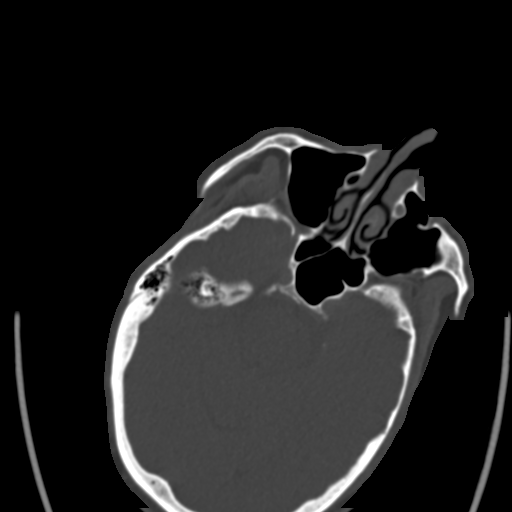
[im 54/84  bone]
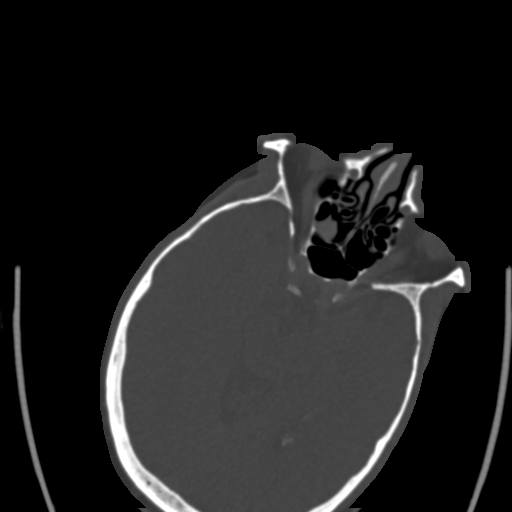
[im 60/84  bone]
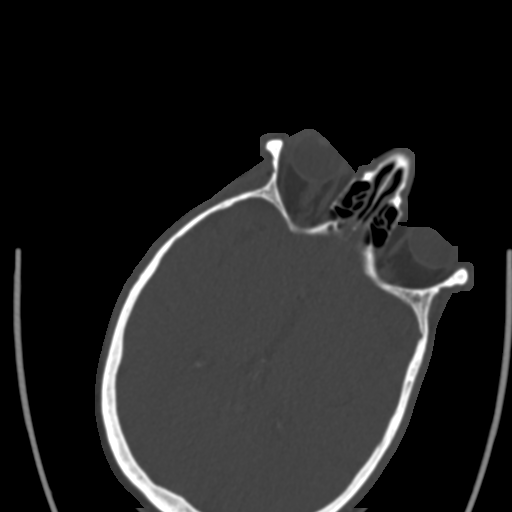
[im 72/84  brain]
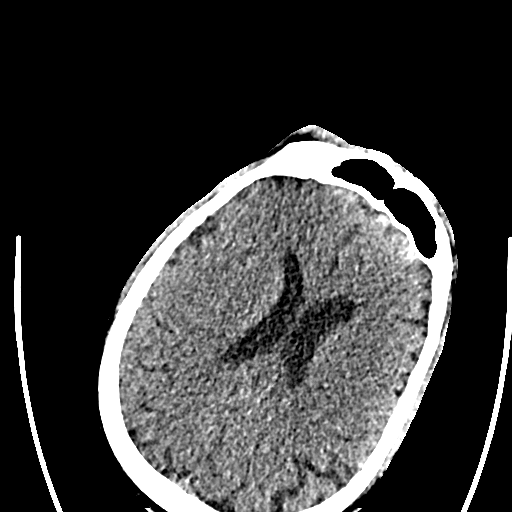
[im 72/84  bone]
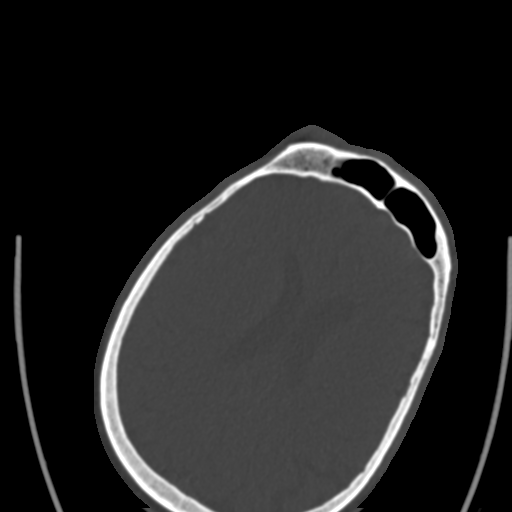
[im 78/84  bone]
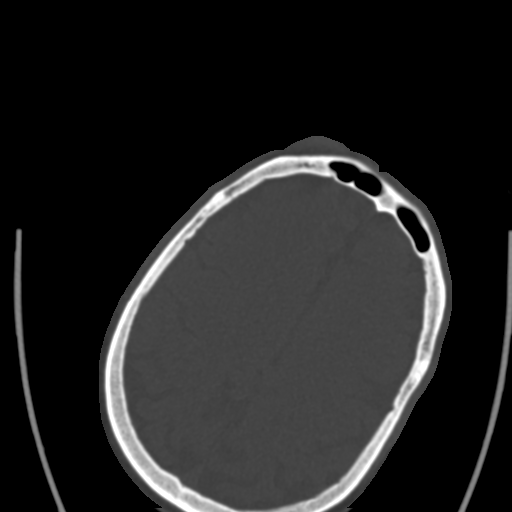

[Series 7: st cor · coronal · 0.32mm/px · 3 of 119 slices shown]
[im 30/119  bone]
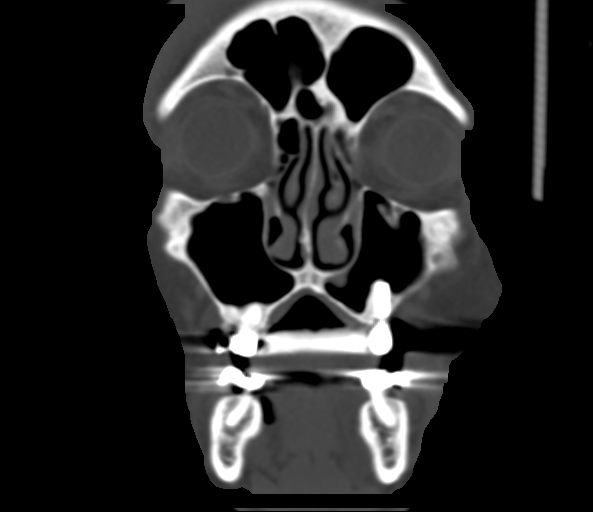
[im 60/119  bone]
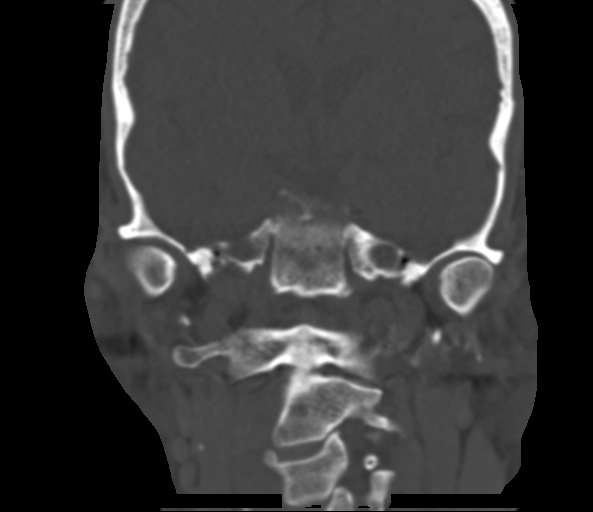
[im 89/119  bone]
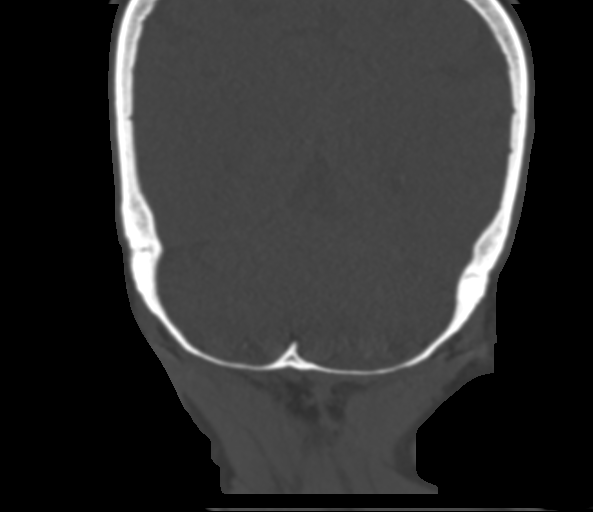

[Series 10: bone sag · sagittal · 0.37mm/px · 2 of 96 slices shown]
[im 32/96  bone]
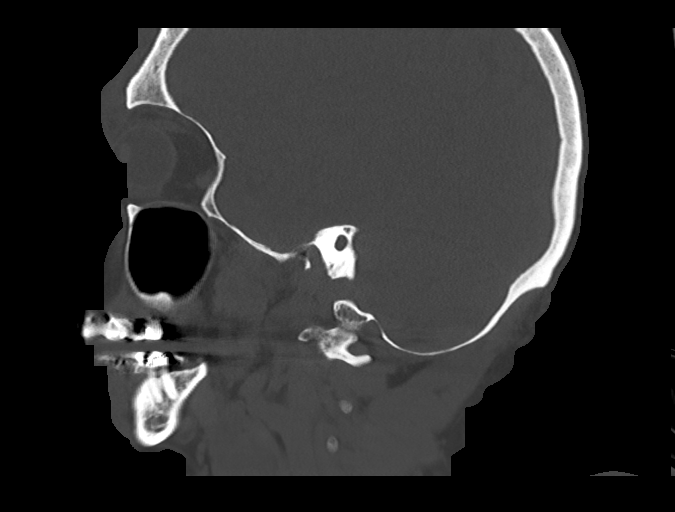
[im 64/96  bone]
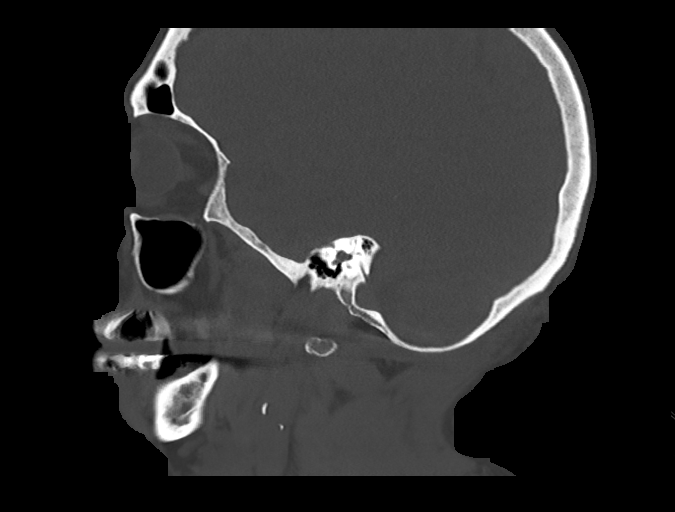

[15 of 47 positions shown; findings below may reference images not displayed]

FINDINGS: Osseous: No fracture or mandibular dislocation. No destructive
process.

Orbits: Significant right periorbital soft tissue swelling. Right
globe is intact. Left orbit is unremarkable.

Sinuses: Minimal polypoid mucosal thickening right posterior ethmoid
air cell. The remaining sinuses are clear.

Soft tissues: Significant right periorbital soft tissue swelling
extending to the right frontal scalp. Remaining soft tissues are
unremarkable. Airway is patent.

Limited intracranial: No significant or unexpected finding.
IMPRESSION: 1. Right periorbital and right frontal scalp soft tissue swelling.
No acute fractures.

## 2020-05-04 IMAGING — DX DG CHEST 1V PORT
1 series · 1 of 1 positions shown · non-contrast
Comparison: Chest radiograph dated [DATE].

CLINICAL DATA: 64-year-old male with altered mental status.

EXAM:
PORTABLE CHEST 1 VIEW

[chest]
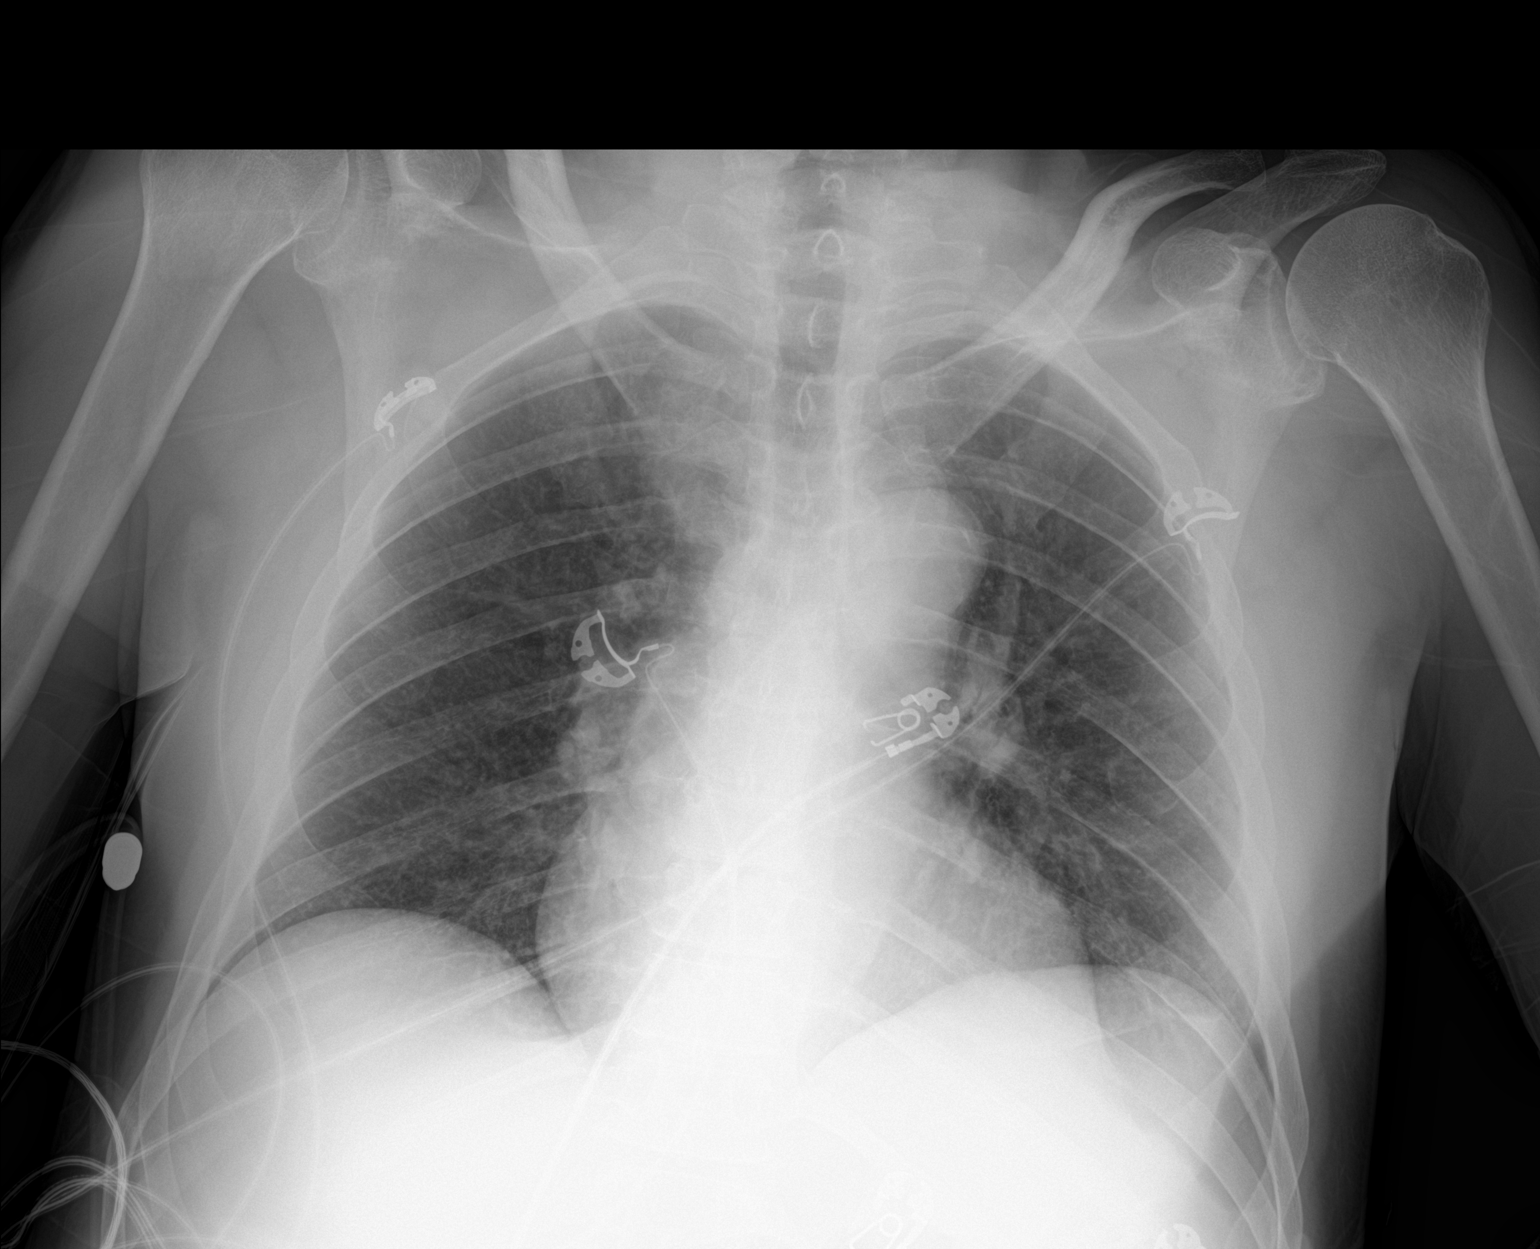

[1 of 1 positions shown; findings below may reference images not displayed]

FINDINGS: The heart size and mediastinal contours are within normal limits.
Both lungs are clear. The visualized skeletal structures are
unremarkable.
IMPRESSION: No active disease.

## 2020-05-04 IMAGING — CT CT CERVICAL SPINE W/O CM
3 of 4 series · 12 of 33 positions shown, 14 images · non-contrast
Comparison: None.

CLINICAL DATA: Dizziness for several days, fell, altered level of
consciousness, right frontal scalp hematoma.

EXAM:
CT CERVICAL SPINE WITHOUT CONTRAST
TECHNIQUE: Multidetector CT imaging of the cervical spine was performed without
intravenous contrast. Multiplanar CT image reconstructions were also
generated.

[Series 8: sag bone · sagittal · 0.30mm/px · 5 of 69 slices shown, 6 images]
[im 23/69  bone]
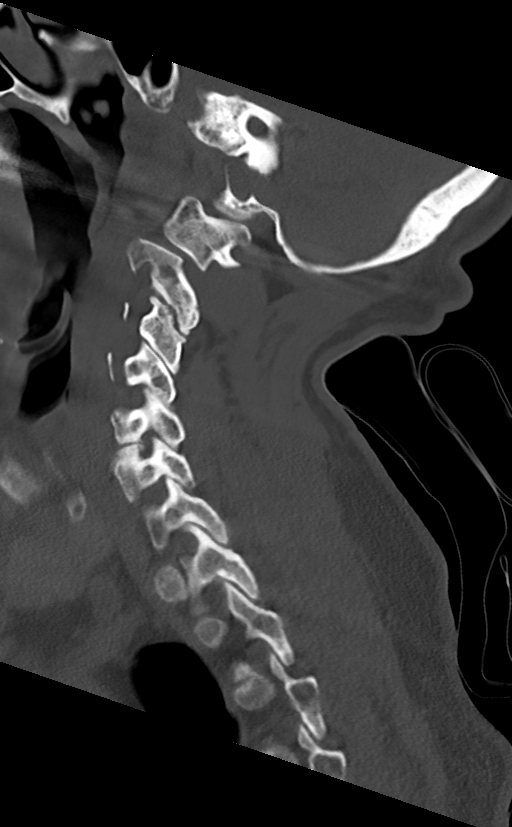
[im 29/69  bone]
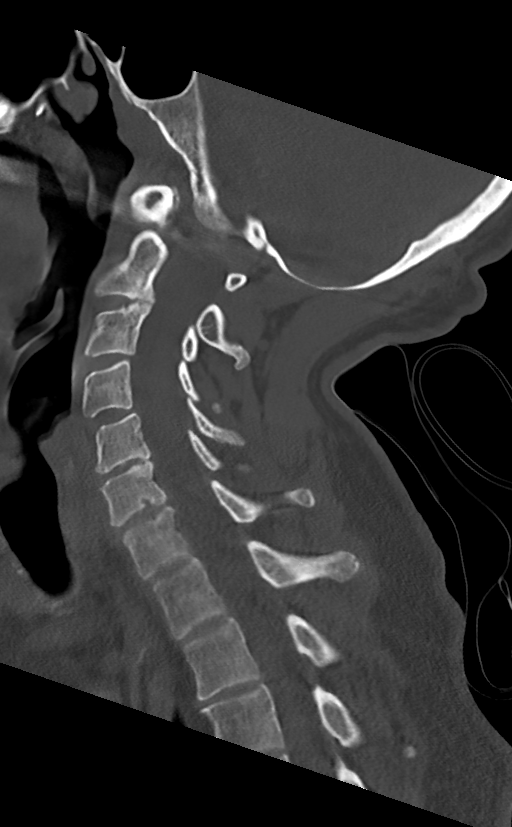
[im 35/69  soft-tissue]
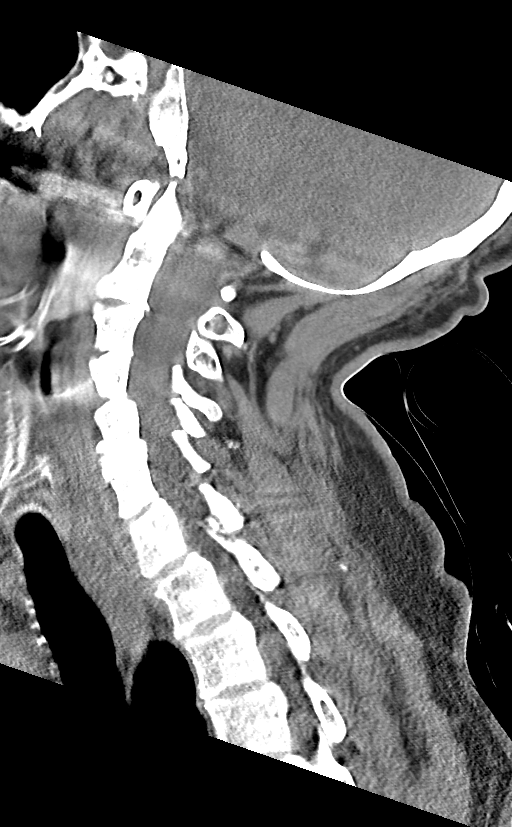
[im 35/69  bone]
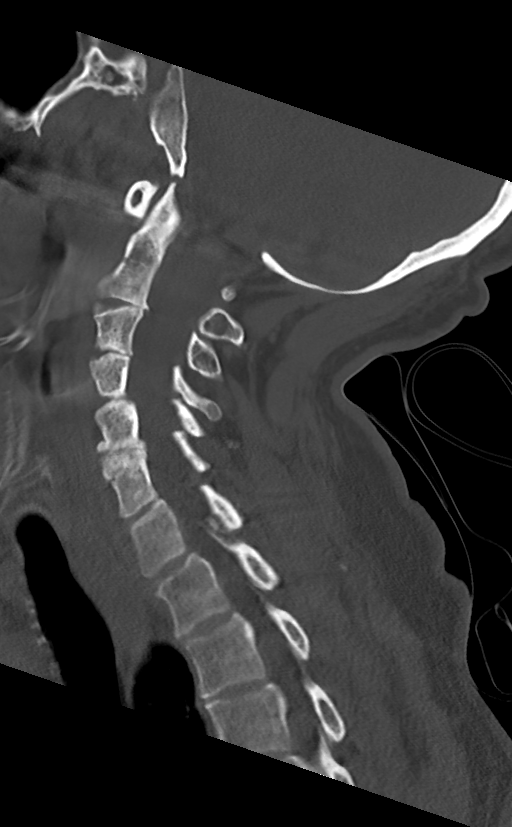
[im 40/69  bone]
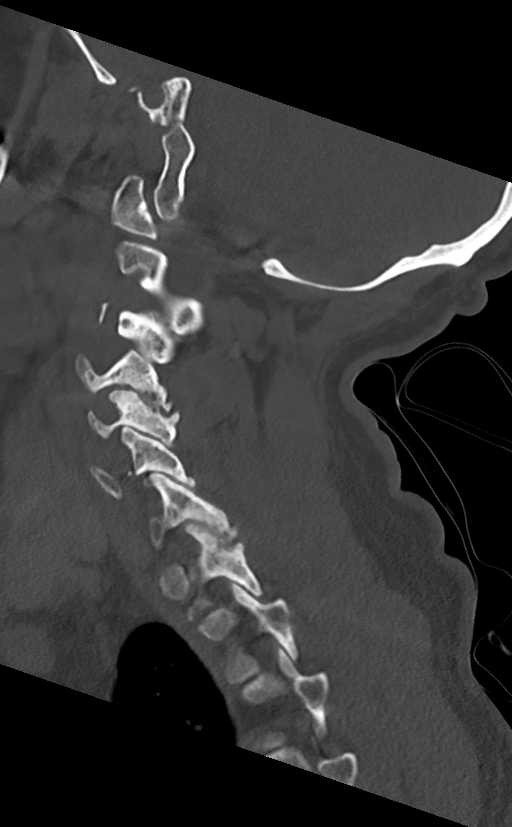
[im 46/69  bone]
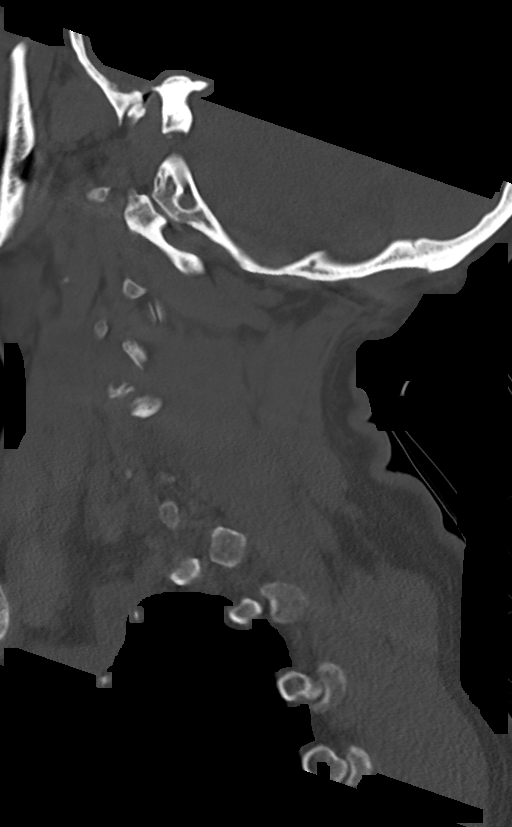

[Series 9: cor bone · coronal · 0.36mm/px · 3 of 82 slices shown]
[im 20/82  bone]
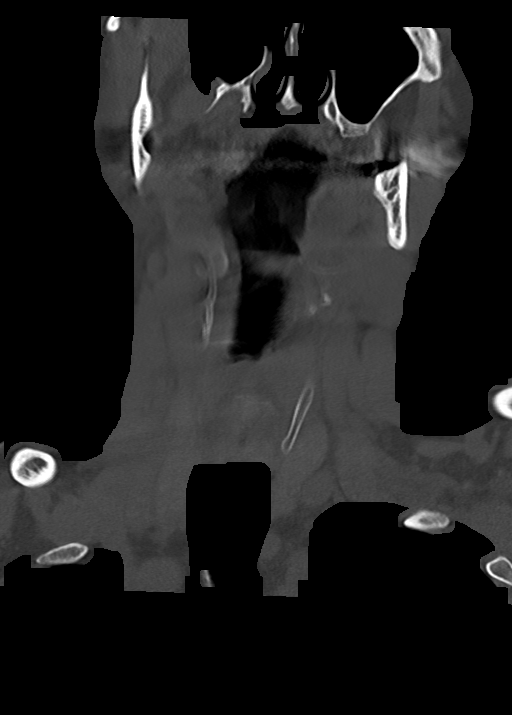
[im 34/82  bone]
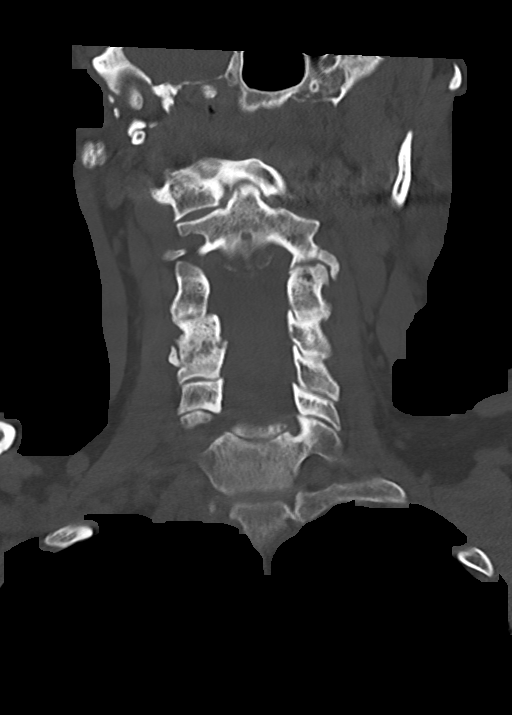
[im 48/82  bone]
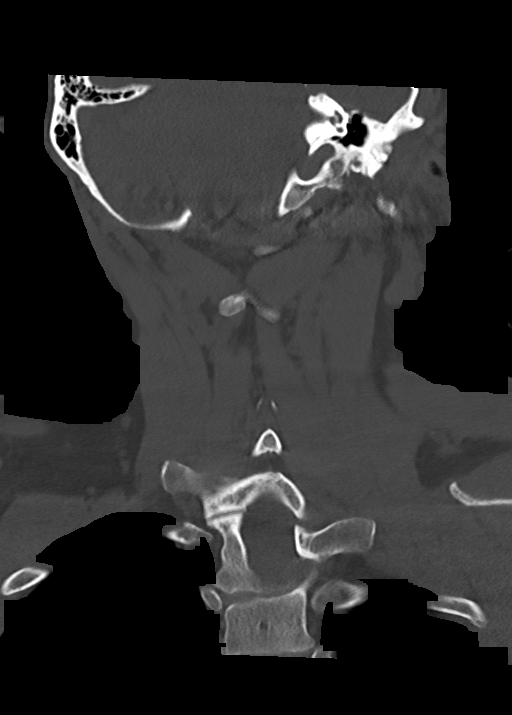

[Series 10: orthogonal axials · axial · 0.21mm/px · z∈[+906,+1029]mm · 4 of 101 slices shown, 5 images]
[im 17/101  soft-tissue]
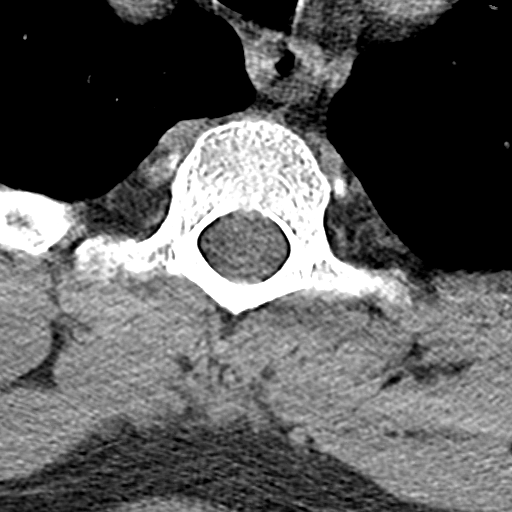
[im 17/101  bone]
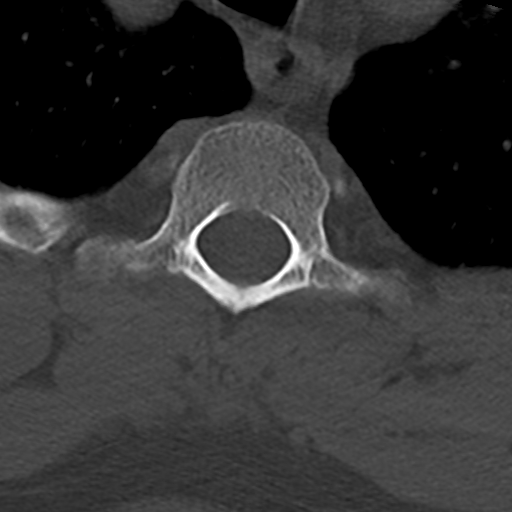
[im 34/101  bone]
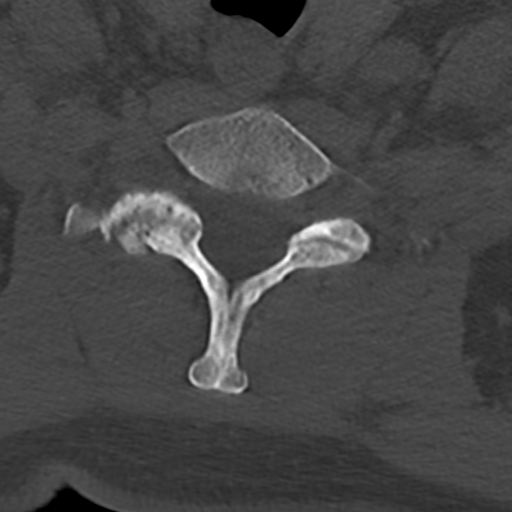
[im 67/101  bone]
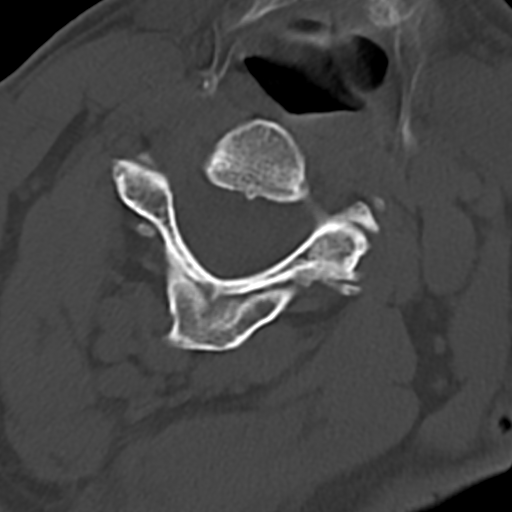
[im 84/101  bone]
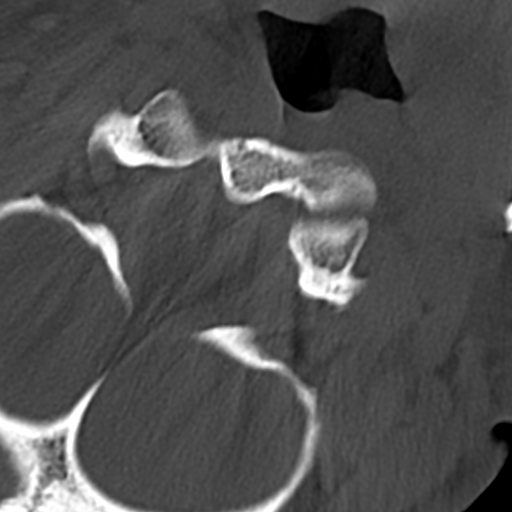

[12 of 33 positions shown; findings below may reference images not displayed]

FINDINGS: Alignment: Alignment is grossly anatomic.

Skull base and vertebrae: No acute fracture. No primary bone lesion
or focal pathologic process.

Soft tissues and spinal canal: No prevertebral fluid or swelling. No
visible canal hematoma.

Disc levels: Mild C2-3 and C5/C6 spondylosis. Prominent facet
hypertrophic changes on the left at C2-3, and on the right at C3-4,
C4-5, and C7-T1. There is mild symmetrical neural foraminal
encroachment at C5-6.

Upper chest: Airway is patent.  Lung apices are clear.

Other: Reconstructed images demonstrate no additional findings.
IMPRESSION: 1. No acute cervical spine fracture.
2. Multilevel cervical spondylosis and facet hypertrophy, with mild
symmetrical neural foraminal encroachment at C5-6.

## 2020-05-04 MED ORDER — FENTANYL CITRATE (PF) 100 MCG/2ML IJ SOLN
50.0000 ug | Freq: Once | INTRAMUSCULAR | Status: AC
  Administered 2020-05-04: 50 ug via INTRAVENOUS
  Filled 2020-05-04: qty 2

## 2020-05-04 MED ORDER — ONDANSETRON HCL 4 MG PO TABS: 4.0000 mg | ORAL_TABLET | Freq: Four times a day (QID) | ORAL | Status: DC | PRN

## 2020-05-04 MED ORDER — ONDANSETRON HCL 4 MG/2ML IJ SOLN
4.0000 mg | Freq: Once | INTRAMUSCULAR | Status: AC
  Administered 2020-05-04: 4 mg via INTRAVENOUS
  Filled 2020-05-04: qty 2

## 2020-05-04 MED ORDER — BENAZEPRIL HCL 20 MG PO TABS
20.0000 mg | ORAL_TABLET | Freq: Every day | ORAL | Status: DC
  Administered 2020-05-05 (×2): 20 mg via ORAL
  Filled 2020-05-04 (×2): qty 1

## 2020-05-04 MED ORDER — ENOXAPARIN SODIUM 40 MG/0.4ML ~~LOC~~ SOLN: 40.0000 mg | Freq: Every day | SUBCUTANEOUS | Status: DC

## 2020-05-04 MED ORDER — SODIUM CHLORIDE 0.9 % IV SOLN
100.0000 mg | Freq: Two times a day (BID) | INTRAVENOUS | Status: DC
  Administered 2020-05-05 (×2): 100 mg via INTRAVENOUS
  Filled 2020-05-04 (×3): qty 100

## 2020-05-04 MED ORDER — ACETAMINOPHEN 650 MG RE SUPP: 650.0000 mg | RECTAL | Status: DC | PRN

## 2020-05-04 MED ORDER — ONDANSETRON HCL 4 MG/2ML IJ SOLN
4.0000 mg | Freq: Four times a day (QID) | INTRAMUSCULAR | Status: DC | PRN
  Administered 2020-05-05: 4 mg via INTRAVENOUS
  Filled 2020-05-04: qty 2

## 2020-05-04 MED ORDER — LEVETIRACETAM IN NACL 1500 MG/100ML IV SOLN
1500.0000 mg | Freq: Once | INTRAVENOUS | Status: AC
  Administered 2020-05-04: 1500 mg via INTRAVENOUS
  Filled 2020-05-04: qty 100

## 2020-05-04 MED ORDER — SODIUM CHLORIDE 0.9 % IV SOLN
75.0000 mL/h | INTRAVENOUS | Status: AC
  Administered 2020-05-05: 75 mL/h via INTRAVENOUS

## 2020-05-04 MED ORDER — CARVEDILOL 12.5 MG PO TABS
25.0000 mg | ORAL_TABLET | Freq: Two times a day (BID) | ORAL | Status: DC
  Administered 2020-05-05 (×2): 25 mg via ORAL
  Filled 2020-05-04 (×2): qty 2

## 2020-05-04 MED ORDER — AMLODIPINE BESYLATE 5 MG PO TABS
5.0000 mg | ORAL_TABLET | Freq: Every day | ORAL | Status: DC
  Administered 2020-05-05: 5 mg via ORAL
  Filled 2020-05-04: qty 1

## 2020-05-04 MED ORDER — LORAZEPAM 2 MG/ML IJ SOLN: 1.0000 mg | INTRAMUSCULAR | Status: DC | PRN

## 2020-05-04 MED ORDER — LORAZEPAM 2 MG/ML IJ SOLN
1.0000 mg | Freq: Once | INTRAMUSCULAR | Status: AC
  Administered 2020-05-04: 1 mg via INTRAVENOUS

## 2020-05-04 MED ORDER — ACETAMINOPHEN 325 MG PO TABS
650.0000 mg | ORAL_TABLET | ORAL | Status: DC | PRN
  Administered 2020-05-05 (×3): 650 mg via ORAL
  Filled 2020-05-04 (×3): qty 2

## 2020-05-04 NOTE — ED Notes (Addendum)
Patient brought in EMS d/t fall that was around 1630 today.   Per wife he said he hadnt been feeling well and dizzy the last few days. Patient became unsteady and fell today at his his head.   Patient is combative and unable to follow commands. Hematoma noted to R eye with significant swelling and bruising noted. Patient was in restraints PTA and given 5mg  IM Versed, 2.5 IM haladol.   Patient still combative and trying to get out of the bed.   No other obvious deformities noted.   VSS at this time  Condom cath placed.

## 2020-05-04 NOTE — ED Notes (Signed)
Level 2 activated, unsure of blood thinner use.

## 2020-05-04 NOTE — ED Notes (Signed)
Patient sleeping, wife at bedside.  No other issues noted at this time.

## 2020-05-04 NOTE — ED Provider Notes (Signed)
[facility] EMERGENCY DEPARTMENT Provider Note   CSN: [id number] Arrival date & time: 05/04/20  1820     History No chief complaint on file.   Matthew [name] is a 65 y.o. male.  Pt is a 64y/o male with hx of bipolar, gerd, HTN, substance abuse who is presenting today with EMS with AMS.  They were called out today due to being found down and altered.  Bystander reported he fell and hit his head.  Unclear if this was witness.  EMS reported the pt was combative and agitated.  Pt received haldol, midazolam due to agitation and EMS safety.  Bystander reported no hx of sz.  Unclear if pt is on any anticoagulation.    The history is provided by the patient. The history is limited by the condition of the patient.       Past Medical History:  Diagnosis Date  . Anxiety   . Bipolar I disorder, most recent episode (or current) mixed, moderate   . GERD (gastroesophageal reflux disease)   . Hypertension   . Migraines   . OSA (obstructive sleep apnea)    retested-bipap not needed  . Panic attacks   . Substance abuse [facility])     Patient Active Problem List   Diagnosis Date Noted  . GERD 09/01/2009  . HEMATOCHEZIA 06/08/2009  . ARM PAIN 12/24/2008  . DYSESTHESIA 12/24/2008  . ABDOMINAL PAIN 01/31/2008  . ANXIETY 04/22/2007  . PANIC ATTACK 04/22/2007  . VERTIGO 04/22/2007  . CHEST PAIN, UNSPECIFIED 04/22/2007  . BIPOLAR DEPRESSION 02/21/2007  . MIGRAINE VARIANTS, W/INTRACTABLE MIGRAINE 02/21/2007  . NAUSEA WITH VOMITING 02/21/2007  . U R I 02/18/2007  . CONSTIPATION 02/18/2007  . HYPERTENSION 02/16/2007    Past Surgical History:  Procedure Laterality Date  . FINGER ARTHROPLASTY Left 06/17/2012   Procedure: FINGER ARTHROPLASTY;  Surgeon: Jodi Marble, MD;  Location: [facility];  Service: Orthopedics;  Laterality: Left;  left thumb suspension arthroplasty   . HEMORRHOID SURGERY  1992   Dr Earlene Plater  . TONSILLECTOMY         Family History   Problem Relation Age of Onset  . Depression Mother   . Hypertension Father   . Cancer Father 31       brain tumor  . Heart disease Paternal Grandmother   . Arthritis Other   . Hypertension Other     Social History   Tobacco Use  . Smoking status: Current Some Day Smoker    Types: Pipe, Cigars  . Smokeless tobacco: Never Used  Substance Use Topics  . Alcohol use: No  . Drug use: No    Types: Cocaine    Comment: past history of use-    Home Medications Prior to Admission medications   Medication Sig Start Date End Date Taking? Authorizing Provider  amLODipine-benazepril (LOTREL) 5-20 MG per capsule TAKE 1 CAPSULE BY MOUTH DAILY 10/04/11   Plotnikov, Georgina Quint, MD  carvedilol (COREG) 25 MG tablet TAKE 1 TABLET BY MOUTH TWICE DAILY WITH A MEAL 10/04/11   Plotnikov, Georgina Quint, MD  lamoTRIgine (LAMICTAL) 200 MG tablet  07/01/18   [provider]  lithium 300 MG capsule Take 300 mg by mouth as directed. 1 in am and 1 - 2 at bedtime    [provider]  naratriptan (AMERGE) 2.5 MG tablet Take 2.5 mg by mouth as needed. .     [provider]  rOPINIRole (REQUIP) 1 MG tablet  07/01/18  [provider]  topiramate (TOPAMAX) 200 MG tablet  07/01/18   [provider]    Allergies    Sumatriptan and Tramadol  Review of Systems   Review of Systems  Unable to perform ROS: Mental status change    Physical Exam Updated Vital Signs BP (!) 173/112   Pulse 78   Temp 97.9 F (36.6 C) (Oral)   Resp 16   SpO2 96%   Physical Exam Vitals and nursing note reviewed.  Constitutional:      General: He is not in acute distress.    Appearance: He is well-developed, normal weight and well-nourished.     Comments: Altered and combative  HENT:     Head: Normocephalic. Contusion present.      Mouth/Throat:     Mouth: Oropharynx is clear and moist.     Comments: Bite marks to the right side of tongue Eyes:     Extraocular Movements: EOM normal.      Conjunctiva/sclera: Conjunctivae normal.     Pupils: Pupils are equal, round, and reactive to light.     Comments: Pt is not cooperative but pupils are 71mm and reactive and seem to be moving in all directions but difficult to discern  Neck:     Comments: Full ROM Cardiovascular:     Rate and Rhythm: Regular rhythm. Tachycardia present.     Pulses: Normal pulses and intact distal pulses.     Heart sounds: No murmur heard.   Pulmonary:     Effort: Pulmonary effort is normal. No respiratory distress.     Breath sounds: Normal breath sounds. No wheezing or rales.  Abdominal:     General: There is no distension.     Palpations: Abdomen is soft.     Tenderness: There is no abdominal tenderness. There is no guarding or rebound.  Musculoskeletal:        General: No tenderness or edema. Normal range of motion.     Cervical back: Normal range of motion and neck supple. No tenderness.     Right lower leg: No edema.     Left lower leg: No edema.  Skin:    General: Skin is warm and dry.     Findings: No erythema or rash.  Neurological:     Mental Status: He is confused.     Comments: Not oriented to person or time but does know he is in [place].  Not following commands but is moving all ext and localizing to pain.  No signs of weakness or numbness.  No noted facial droop  Psychiatric:        Mood and Affect: Mood and affect normal.     Comments: Altered and agitated     ED Results / Procedures / Treatments   Labs (all labs ordered are listed, but only abnormal results are displayed) Labs Reviewed  CBC WITH DIFFERENTIAL/PLATELET - Abnormal; Notable for the following components:      Result Value   Neutro Abs 9.2 (*)    All other components within normal limits  COMPREHENSIVE METABOLIC PANEL - Abnormal; Notable for the following components:   CO2 20 (*)    Glucose, Bld 173 (*)    All other components within normal limits  RESP PANEL BY RT-PCR (FLU A&B, COVID) ARPGX2  ETHANOL   AMMONIA  LITHIUM LEVEL    EKG None  Radiology CT Head Wo Contrast  Result Date: 05/04/2020 CLINICAL DATA:  Altered level of consciousness, dizziness for several days, fell  EXAM: CT HEAD WITHOUT CONTRAST TECHNIQUE: Contiguous axial images were obtained from the base of the skull through the vertex without intravenous contrast. COMPARISON:  None. FINDINGS: Brain: Patient motion limits evaluation. No signs of acute infarct or hemorrhage. Lateral ventricles and midline structures are unremarkable. No acute extra-axial fluid collections. No mass effect. Vascular: No hyperdense vessel or unexpected calcification. Skull: Large right supraorbital scalp hematoma. No underlying fracture. The remainder of the calvarium is unremarkable. Sinuses/Orbits: No acute finding. Other: None. IMPRESSION: 1. Study limited by patient motion. 2. Right frontal scalp hematoma. 3. No acute intracranial process. Electronically Signed   By: Randa Ngo M.D.   On: 05/04/2020 19:42   CT Cervical Spine Wo Contrast  Result Date: 05/04/2020 CLINICAL DATA:  Dizziness for several days, fell, altered level of consciousness, right frontal scalp hematoma. EXAM: CT CERVICAL SPINE WITHOUT CONTRAST TECHNIQUE: Multidetector CT imaging of the cervical spine was performed without intravenous contrast. Multiplanar CT image reconstructions were also generated. COMPARISON:  None. FINDINGS: Alignment: Alignment is grossly anatomic. Skull base and vertebrae: No acute fracture. No primary bone lesion or focal pathologic process. Soft tissues and spinal canal: No prevertebral fluid or swelling. No visible canal hematoma. Disc levels: Mild C2-3 and C5/C6 spondylosis. Prominent facet hypertrophic changes on the left at C2-3, and on the right at C3-4, C4-5, and C7-T1. There is mild symmetrical neural foraminal encroachment at C5-6. Upper chest: Airway is patent.  Lung apices are clear. Other: Reconstructed images demonstrate no additional findings.  IMPRESSION: 1. No acute cervical spine fracture. 2. Multilevel cervical spondylosis and facet hypertrophy, with mild symmetrical neural foraminal encroachment at C5-6. Electronically Signed   By: Randa Ngo M.D.   On: 05/04/2020 19:48   CT Maxillofacial Wo Contrast  Result Date: 05/04/2020 CLINICAL DATA:  Dizziness, altered level of consciousness, right periorbital hematoma EXAM: CT MAXILLOFACIAL WITHOUT CONTRAST TECHNIQUE: Multidetector CT imaging of the maxillofacial structures was performed. Multiplanar CT image reconstructions were also generated. COMPARISON:  None. FINDINGS: Osseous: No fracture or mandibular dislocation. No destructive process. Orbits: Significant right periorbital soft tissue swelling. Right globe is intact. Left orbit is unremarkable. Sinuses: Minimal polypoid mucosal thickening right posterior ethmoid air cell. The remaining sinuses are clear. Soft tissues: Significant right periorbital soft tissue swelling extending to the right frontal scalp. Remaining soft tissues are unremarkable. Airway is patent. Limited intracranial: No significant or unexpected finding. IMPRESSION: 1. Right periorbital and right frontal scalp soft tissue swelling. No acute fractures. Electronically Signed   By: Randa Ngo M.D.   On: 05/04/2020 19:45    Procedures Procedures (including critical care time)  Medications Ordered in ED Medications  LORazepam (ATIVAN) injection 1 mg (has no administration in time range)    ED Course  I have reviewed the triage vital signs and the nursing notes.  Pertinent labs & imaging results that were available during my care of the patient were reviewed by me and considered in my medical decision making (see chart for details).    MDM Rules/Calculators/A&P                          Elderly male with sx today which seem most like new onset seizure.  Tongue bite and agitated which may be from post-ictal.  Patient has no prior history of seizure disorder but  in his chart there is a history of substance abuse.  Unclear if patient abuses alcohol or uses benzodiazepines and this is a withdrawal seizure.  Potential  head bleed or brain tumor with mass-effect.  Patient is currently altered.  He is combative and did require restraints initially as he was trying to pull out all of his lines and chemical sedation was still taking effect.  Head CT is negative for acute bleed maxillofacial CT is negative for acute facial fractures.  No other evidence of injury on exam and he is moving all extremities.  Cervical spine without acute findings.  CBC within normal limits, CMP within normal limits.  8:18 PM Wife is now present and reports a history that patient has been having some mild spells over the last week and a half and today had been having these issues every hour and then had what sounded like a full-blown seizure which was witnessed by his wife.  She reports he fell forward hitting his face on a table as he fell and then had generalized shaking.  No prior history of seizures.  No history of alcohol or benzodiazepine use.  He does have bipolar disease and does take lithium but wife reports that he gets the level checked regularly and has been normal.  On repeat evaluation patient is still drowsy and is not back to 100% of his baseline but is improved from arrival.  He has now been altered for approximately 2-1/2 hours.  Will discuss with neurology whether patient needs to be loaded with Keppra.  Lithium level is pending.  8:29 PM Book with neurology who recommended admission, Keppra load, EEG and MRI.  Will admit for further care.  MDM Number of Diagnoses or Management Options   Amount and/or Complexity of Data Reviewed Clinical lab tests: ordered and reviewed Tests in the radiology section of CPT: ordered and reviewed Obtain history from someone other than the patient: yes Review and summarize past medical records: yes Discuss the patient with other providers:  yes Independent visualization of images, tracings, or specimens: yes  Risk of Complications, Morbidity, and/or Mortality Presenting problems: high Diagnostic procedures: moderate Management options: moderate  Patient Progress Patient progress: improved  CRITICAL CARE Performed by: Kylar Speelman Total critical care time: 30 minutes Critical care time was exclusive of separately billable procedures and treating other patients. Critical care was necessary to treat or prevent imminent or life-threatening deterioration. Critical care was time spent personally by me on the following activities: development of treatment plan with patient and/or surrogate as well as nursing, discussions with consultants, evaluation of patient's response to treatment, examination of patient, obtaining history from patient or surrogate, ordering and performing treatments and interventions, ordering and review of laboratory studies, ordering and review of radiographic studies, pulse oximetry and re-evaluation of patient's condition.   Final Clinical Impression(s) / ED Diagnoses Final diagnoses:  Seizure (HCC)  Contusion of face, initial encounter    Rx / DC Orders ED Discharge Orders    None       Gwyneth Sprout, MD 05/04/20 2030

## 2020-05-04 NOTE — H&P (Addendum)
History and Physical   Matthew [name]:[id number] DOB: 19-Jan-1956 DOA: 05/04/2020  PCP: Hulan Fess, MD   Patient coming from: home via EMS  I have personally briefly reviewed patient's old medical records in [place].  Chief Concern: fall and possible seizure  HPI: Matthew [name] is a 65 y.o. male with medical history significant for bipolar, hypertension, history of migraine headaches, presented to the emergency department for chief concerns of fall and altered mental status.  Patient is status post post ictal.  HPI obtained via spouse at bedside.  Spouse reports that for the last seven days, patient has had episodes of staring with both eyes wide opening intermittently.  This happened multiple times per day.  Spouse reports that he would look like he forgot to take his blood pressure medicine.  Today, while spouse was sitting at a computer, patient was standing and leaning forward, with his hands against a counter and looked over his left shoulder, than he turned his head to the left side and started walking to the living room and began to stumble.  Spouse reports that patient then fell and hit his head on dining room furniture and lamps.  Spouse reports that this has never happened before.   While neurologist was talking to patient, he endorsed double vision during staring episodes. Spouse denies fever in last seven days, and she reported no nausea, vomiting, diarrhea.   Spouse at bedside endorses that she was in [place] for 2.5 weeks and patient's daughter and grandchildren came to visit patient, and patient is very hands on with the children and hleps out a lot. Spouse states that there is a possibility that patient may have forgotten to take his medications.  ROS: Unable to obtain as patient is lethargic  ED Course: Discussed with ED provider, patient required hospitalization for seizure work-up.  Patient was brought in via EMS for chief concerns of fall at  approximately 1630 on 05/05/2019.  Patient was combative and unable to follow commands at the time.  Per chart review, patient was in restraints and given 5 mg IM Versed, 2.5 mg IM Haldol.  Assessment/Plan  Active Problems:   Bipolar I disorder [facility])   Essential hypertension   Fall   Fall and episodes of staring into space-suspicious for seizure Recurrent seizure possible -Status post loading dose of Keppra in the ED -Neurology has been consulted -Ativan 2 mg as needed daily 2 hours for seizure -N.p.o., speech has been consulted -Seizure precaution -Fall precautions -N.p.o. -EEG has been ordered further recommendations pending neurology consult note  Bipolar - lithium 300 mg capscules, take three capsules qhs and lamotrigine 200 mg qhs  -I have not resumed at these medications -Pending neurology recommendations  Hypertension - no history of MI  - Carvedilol 25 mg BID, amlodipine 5 mg daily, benazepril 20 mg  daily -These have been resumed  Right eye lid swelling and ecchymosis-present on admission, patient is unable to verbalize to me eye pain with movement -CT of the maxillary states right periorbital swelling, Unasyn IV once and doxycycline 100 mg IV twice daily for prophylactic coverage -Ice to affected area -A.m. primary team to assess for need of ABX continuation  Chart reviewed.   DVT prophylaxis: TED hose Code Status: full code   Diet: N.p.o. Family Communication: discussed with spouse at bedside Disposition Plan: Pending clinical course and neurology recommendation Consults called: neurology  Admission status: Observation with telemetry  Past Medical History:  Diagnosis Date  . Anxiety   .  Bipolar I disorder, most recent episode (or current) mixed, moderate   . GERD (gastroesophageal reflux disease)   . Hypertension   . Migraines   . OSA (obstructive sleep apnea)    retested-bipap not needed  . Panic attacks   . Substance abuse [facility])    Past Surgical  History:  Procedure Laterality Date  . FINGER ARTHROPLASTY Left 06/17/2012   Procedure: FINGER ARTHROPLASTY;  Surgeon: Jodi Marble, MD;  Location: [facility];  Service: Orthopedics;  Laterality: Left;  left thumb suspension arthroplasty   . HEMORRHOID SURGERY  1992   Dr Earlene Plater  . TONSILLECTOMY     Social History:  reports that he has been smoking pipe and cigars. He has never used smokeless tobacco. He reports that he does not drink alcohol and does not use drugs.  Allergies  Allergen Reactions  . Sumatriptan Other (See Comments)    ( Imitrex)- dizziness  . Tramadol     Severe dizziness   Family History  Problem Relation Age of Onset  . Depression Mother   . Hypertension Father   . Cancer Father 13       brain tumor  . Heart disease Paternal Grandmother   . Arthritis Other   . Hypertension Other    Family history: Family history reviewed and not pertinent  Prior to Admission medications   Medication Sig Start Date End Date Taking? Authorizing Provider  amLODipine-benazepril (LOTREL) 5-20 MG per capsule TAKE 1 CAPSULE BY MOUTH DAILY 10/04/11   Plotnikov, Georgina Quint, MD  carvedilol (COREG) 25 MG tablet TAKE 1 TABLET BY MOUTH TWICE DAILY WITH A MEAL 10/04/11   Plotnikov, Georgina Quint, MD  lamoTRIgine (LAMICTAL) 200 MG tablet  07/01/18   [provider]  lithium 300 MG capsule Take 300 mg by mouth as directed. 1 in am and 1 - 2 at bedtime    [provider]  naratriptan (AMERGE) 2.5 MG tablet Take 2.5 mg by mouth as needed. .     [provider]  rOPINIRole (REQUIP) 1 MG tablet  07/01/18   [provider]  topiramate (TOPAMAX) 200 MG tablet  07/01/18   [provider]   Physical Exam: Vitals:   05/04/20 2100 05/04/20 2115 05/04/20 2130 05/04/20 2215  BP: (!) 160/103 (!) 147/101 (!) 163/88 (!) 152/96  Pulse: 86 81 83 85  Resp: 15 19 11 13   Temp:      TempSrc:      SpO2:  94% 96% 94%   Constitutional: appears  age-appropriate, NAD, calm, comfortable Eyes: PERRL, lids and conjunctivae normal.  Right eyelid ecchymosis and generalized orbital swelling ENMT: Mucous membranes are moist. Posterior pharynx clear of any exudate or lesions. Age-appropriate dentition.  Unable to assess hearing Neck: normal, supple, no masses, no thyromegaly Respiratory: clear to auscultation bilaterally, no wheezing, no crackles. Normal respiratory effort. No accessory muscle use.  Cardiovascular: Regular rate and rhythm, no murmurs / rubs / gallops. No extremity edema. 2+ pedal pulses. No carotid bruits.  Abdomen: no tenderness, no masses palpated, no hepatosplenomegaly. Bowel sounds positive.  Musculoskeletal: no clubbing / cyanosis. No joint deformity upper and lower extremities. Good ROM, no contractures, no atrophy. Normal muscle tone.  Skin: no rashes, lesions, ulcers. No induration Neurologic: Sensation intact. Strength 5/5 in all 4.  Psychiatric: Lethargic and likely postictal  EKG: independently reviewed, showing sinus rhythm with rate of 82, QTc 437  Chest x-ray on Admission: I personally reviewed and I agree with radiologist reading as  below.  CT Head Wo Contrast  Result Date: 05/04/2020 CLINICAL DATA:  Altered level of consciousness, dizziness for several days, fell EXAM: CT HEAD WITHOUT CONTRAST TECHNIQUE: Contiguous axial images were obtained from the base of the skull through the vertex without intravenous contrast. COMPARISON:  None. FINDINGS: Brain: Patient motion limits evaluation. No signs of acute infarct or hemorrhage. Lateral ventricles and midline structures are unremarkable. No acute extra-axial fluid collections. No mass effect. Vascular: No hyperdense vessel or unexpected calcification. Skull: Large right supraorbital scalp hematoma. No underlying fracture. The remainder of the calvarium is unremarkable. Sinuses/Orbits: No acute finding. Other: None. IMPRESSION: 1. Study limited by patient motion. 2. Right  frontal scalp hematoma. 3. No acute intracranial process. Electronically Signed   By: Sharlet Salina M.D.   On: 05/04/2020 19:42   CT Cervical Spine Wo Contrast  Result Date: 05/04/2020 CLINICAL DATA:  Dizziness for several days, fell, altered level of consciousness, right frontal scalp hematoma. EXAM: CT CERVICAL SPINE WITHOUT CONTRAST TECHNIQUE: Multidetector CT imaging of the cervical spine was performed without intravenous contrast. Multiplanar CT image reconstructions were also generated. COMPARISON:  None. FINDINGS: Alignment: Alignment is grossly anatomic. Skull base and vertebrae: No acute fracture. No primary bone lesion or focal pathologic process. Soft tissues and spinal canal: No prevertebral fluid or swelling. No visible canal hematoma. Disc levels: Mild C2-3 and C5/C6 spondylosis. Prominent facet hypertrophic changes on the left at C2-3, and on the right at C3-4, C4-5, and C7-T1. There is mild symmetrical neural foraminal encroachment at C5-6. Upper chest: Airway is patent.  Lung apices are clear. Other: Reconstructed images demonstrate no additional findings. IMPRESSION: 1. No acute cervical spine fracture. 2. Multilevel cervical spondylosis and facet hypertrophy, with mild symmetrical neural foraminal encroachment at C5-6. Electronically Signed   By: Sharlet Salina M.D.   On: 05/04/2020 19:48   DG Chest Port 1 View  Result Date: 05/04/2020 CLINICAL DATA:  65 year old male with altered mental status. EXAM: PORTABLE CHEST 1 VIEW COMPARISON:  Chest radiograph dated 01/09/2011. FINDINGS: The heart size and mediastinal contours are within normal limits. Both lungs are clear. The visualized skeletal structures are unremarkable. IMPRESSION: No active disease. Electronically Signed   By: Elgie Collard M.D.   On: 05/04/2020 22:02   CT Maxillofacial Wo Contrast  Result Date: 05/04/2020 CLINICAL DATA:  Dizziness, altered level of consciousness, right periorbital hematoma EXAM: CT MAXILLOFACIAL  WITHOUT CONTRAST TECHNIQUE: Multidetector CT imaging of the maxillofacial structures was performed. Multiplanar CT image reconstructions were also generated. COMPARISON:  None. FINDINGS: Osseous: No fracture or mandibular dislocation. No destructive process. Orbits: Significant right periorbital soft tissue swelling. Right globe is intact. Left orbit is unremarkable. Sinuses: Minimal polypoid mucosal thickening right posterior ethmoid air cell. The remaining sinuses are clear. Soft tissues: Significant right periorbital soft tissue swelling extending to the right frontal scalp. Remaining soft tissues are unremarkable. Airway is patent. Limited intracranial: No significant or unexpected finding. IMPRESSION: 1. Right periorbital and right frontal scalp soft tissue swelling. No acute fractures. Electronically Signed   By: Sharlet Salina M.D.   On: 05/04/2020 19:45   Labs on Admission: I have personally reviewed following labs  CBC: Recent Labs  Lab 05/04/20 1826  WBC 10.3  NEUTROABS 9.2*  HGB 16.0  HCT 48.2  MCV 89.3  PLT 237   Basic Metabolic Panel: Recent Labs  Lab 05/04/20 1826  NA 135  K 3.9  CL 102  CO2 20*  GLUCOSE 173*  BUN 15  CREATININE 1.08  CALCIUM  9.3   GFR: CrCl cannot be calculated (Unknown ideal weight.). Liver Function Tests: Recent Labs  Lab 05/04/20 1826  AST 20  ALT 14  ALKPHOS 57  BILITOT 1.2  PROT 7.0  ALBUMIN 4.5   Urine analysis:    Component Value Date/Time   COLORURINE LT. YELLOW 10/24/2010 0817   APPEARANCEUR CLEAR 10/24/2010 0817   LABSPEC <=1.005 10/24/2010 0817   PHURINE 7.0 10/24/2010 0817   GLUCOSEU NEGATIVE 10/24/2010 0817   HGBUR NEGATIVE 10/24/2010 0817   BILIRUBINUR NEGATIVE 10/24/2010 0817   KETONESUR NEGATIVE 10/24/2010 0817   UROBILINOGEN 0.2 10/24/2010 0817   NITRITE NEGATIVE 10/24/2010 0817   LEUKOCYTESUR NEGATIVE 10/24/2010 0817   Amy N Cox D.O. [facility]  If 7PM-7AM, please contact overnight-coverage  provider If 7AM-7PM, please contact day coverage provider [email or web address]  05/04/2020, 11:18 PM

## 2020-05-04 NOTE — ED Notes (Signed)
Taken to CT.
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
## 2020-05-05 ENCOUNTER — Observation Stay (HOSPITAL_COMMUNITY): Payer: Medicare Other

## 2020-05-05 ENCOUNTER — Encounter (HOSPITAL_COMMUNITY): Payer: Self-pay | Admitting: Internal Medicine

## 2020-05-05 DIAGNOSIS — F319 Bipolar disorder, unspecified: Secondary | ICD-10-CM | POA: Diagnosis not present

## 2020-05-05 DIAGNOSIS — I10 Essential (primary) hypertension: Secondary | ICD-10-CM | POA: Diagnosis not present

## 2020-05-05 DIAGNOSIS — S0511XA Contusion of eyeball and orbital tissues, right eye, initial encounter: Secondary | ICD-10-CM | POA: Diagnosis not present

## 2020-05-05 DIAGNOSIS — W19XXXA Unspecified fall, initial encounter: Secondary | ICD-10-CM | POA: Diagnosis not present

## 2020-05-05 DIAGNOSIS — R569 Unspecified convulsions: Secondary | ICD-10-CM | POA: Diagnosis not present

## 2020-05-05 LAB — HIV ANTIBODY (ROUTINE TESTING W REFLEX): HIV Screen 4th Generation wRfx: NONREACTIVE

## 2020-05-05 IMAGING — MR MR HEAD WO/W CM
16 of 18 series · 42 of 48 positions shown · IV contrast (gadavist)
Comparison: CT [DATE]

CLINICAL DATA: Fall and possible seizure.  History of seizures.

EXAM:
MRI HEAD WITHOUT AND WITH CONTRAST
TECHNIQUE: Multiplanar, multiecho pulse sequences of the brain and surrounding
structures were obtained without and with intravenous contrast.
CONTRAST:  7mL GADAVIST GADOBUTROL 1 MMOL/ML IV SOLN

[Series 5: DWI · axial · 3.0mm · 0.88mm/px · z∈[-78,+76]mm · 7 of 108 slices shown (1 of 4)]
[im 1/108]
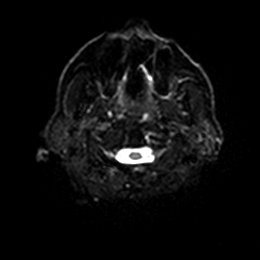
[im 18/108]
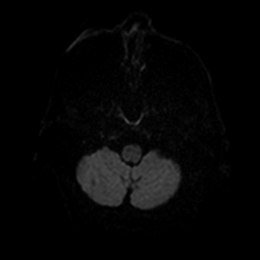
[im 36/108]
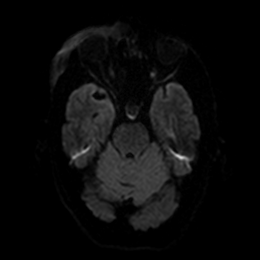
[im 54/108]
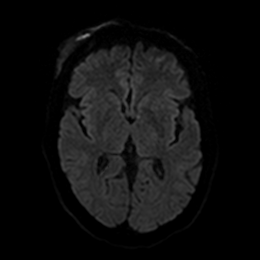
[im 72/108]
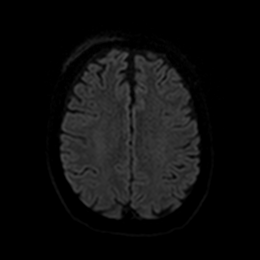
[im 90/108]
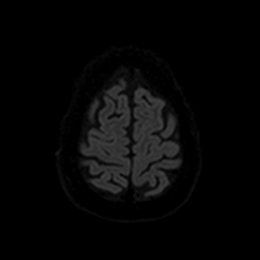
[im 108/108]
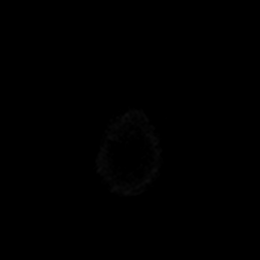

[Series 6: DWI · axial · 3.0mm · 0.88mm/px · z∈[-78,+76]mm · 3 of 54 slices shown (2 of 4)]
[im 1/54]
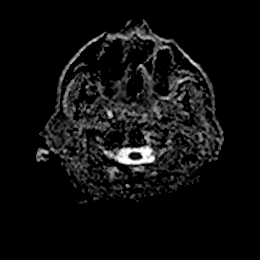
[im 27/54]
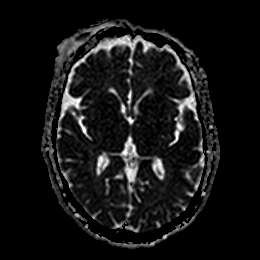
[im 54/54]
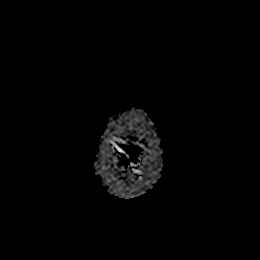

[Series 7: DWI · coronal · 4.0mm · 0.88mm/px · 4 of 72 slices shown (3 of 4)]
[im 1/72]
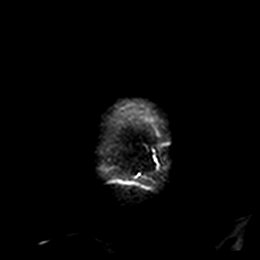
[im 24/72]
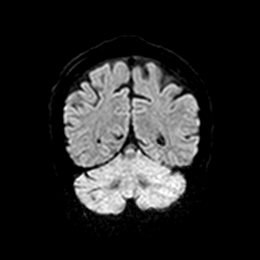
[im 48/72]
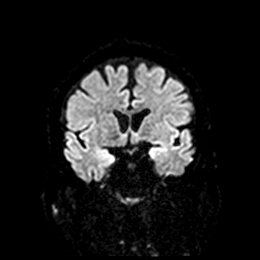
[im 72/72]
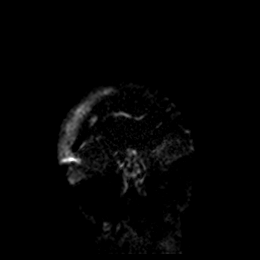

[Series 8: DWI · coronal · 4.0mm · 0.88mm/px · 2 of 36 slices shown (4 of 4)]
[im 1/36]
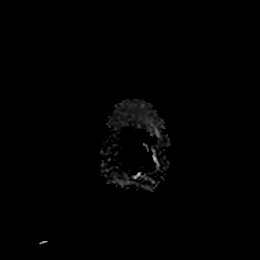
[im 36/36]
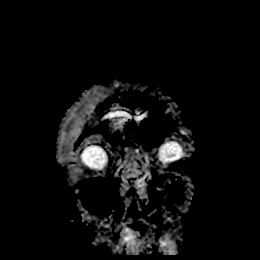

[Series 9: T1 · sagittal · 5.0mm · 0.75mm/px · 1 of 25 slices shown]
[im 1/25]
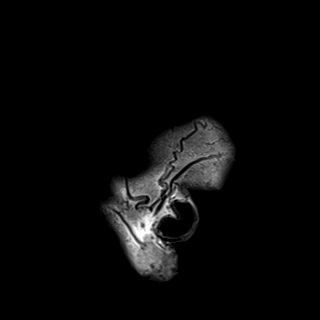

[Series 10: T2 · axial · 5.0mm · 0.72mm/px · z∈[-77,+74]mm · 2 of 27 slices shown (1 of 2)]
[im 1/27]
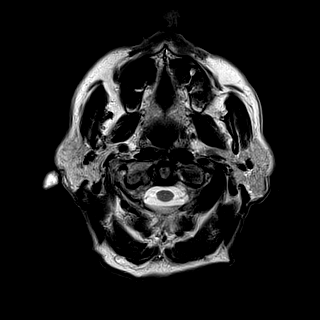
[im 27/27]
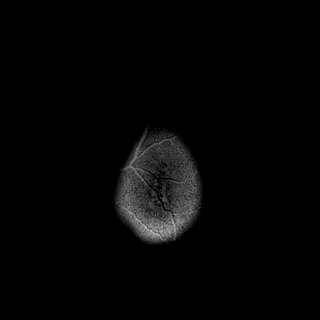

[Series 11: FLAIR · axial · 5.0mm · 0.45mm/px · z∈[-79,+72]mm · 2 of 27 slices shown (1 of 2)]
[im 1/27]
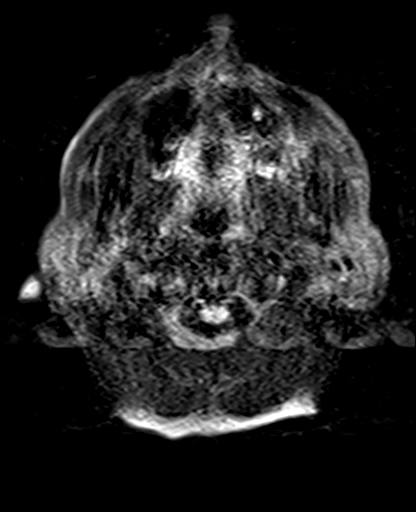
[im 27/27]
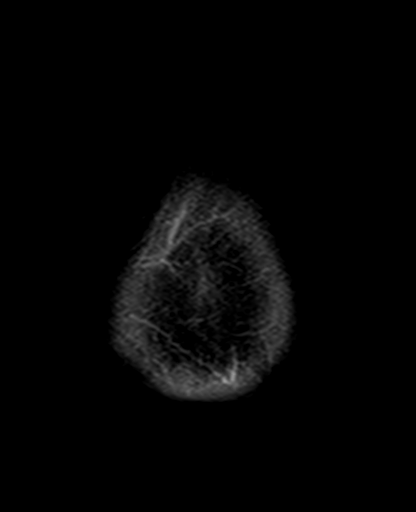

[Series 12: mag_images · axial · 3.0mm · 0.90mm/px · z∈[-84,+76]mm · 3 of 56 slices shown]
[im 1/56]
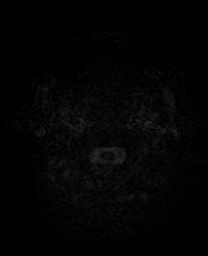
[im 28/56]
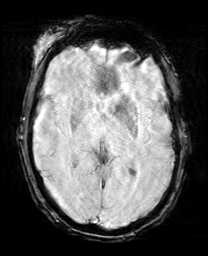
[im 56/56]
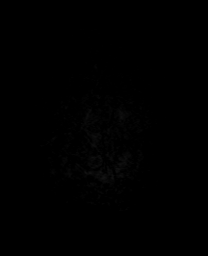

[Series 13: pha_images · axial · 3.0mm · 0.90mm/px · z∈[-81,+73]mm · 3 of 54 slices shown]
[im 1/54]
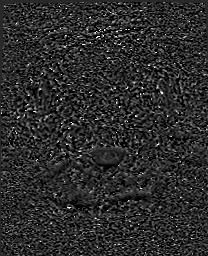
[im 27/54]
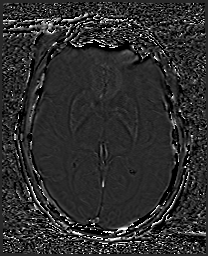
[im 54/54]
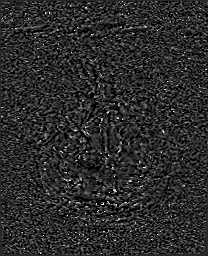

[Series 14: swi_images · axial · 3.0mm · 0.90mm/px · z∈[-84,+76]mm · 3 of 56 slices shown]
[im 1/56]
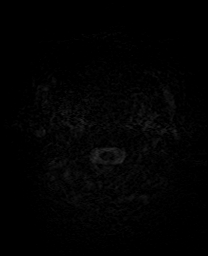
[im 28/56]
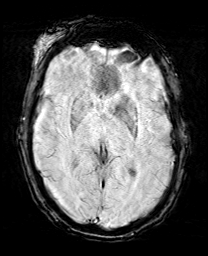
[im 56/56]
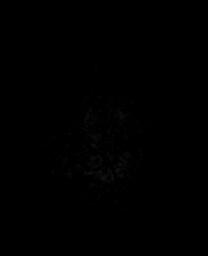

[Series 15: mip_images(sw) · axial · 24.0mm · 0.90mm/px · z∈[-74,+66]mm · 3 of 49 slices shown]
[im 1/49]
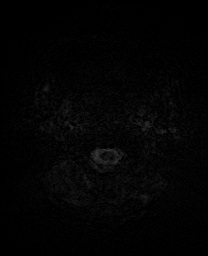
[im 25/49]
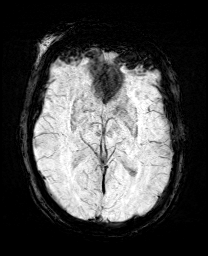
[im 49/49]
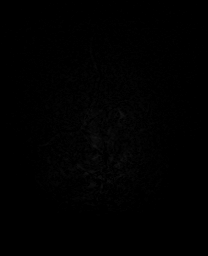

[Series 17: T2 · coronal · 3.0mm · 0.27mm/px · 2 of 32 slices shown (2 of 2)]
[im 1/32]
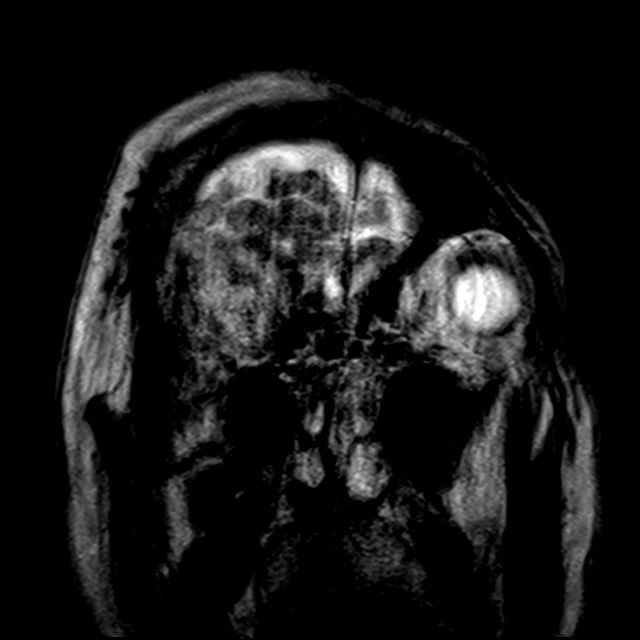
[im 32/32]
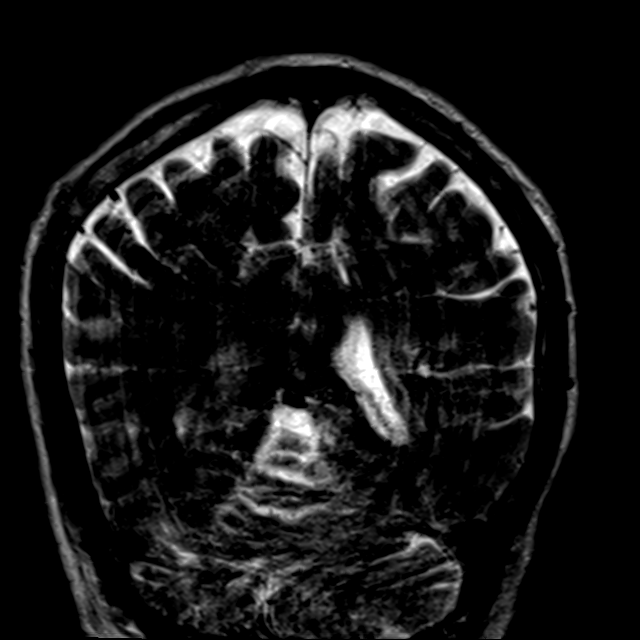

[Series 18: FLAIR · coronal · 3.0mm · 0.56mm/px · 2 of 28 slices shown (2 of 2)]
[im 1/28]
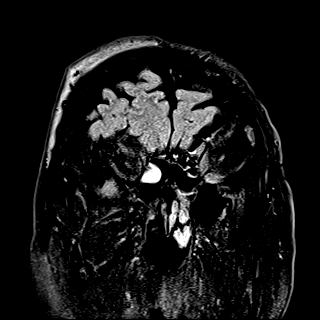
[im 28/28]
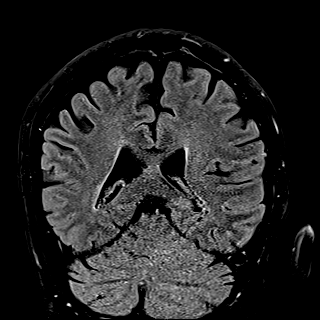

[Series 19: T2 post-contrast · coronal · 5.0mm · 0.72mm/px · 2 of 28 slices shown]
[im 1/28]
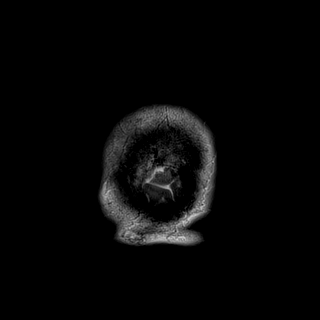
[im 28/28]
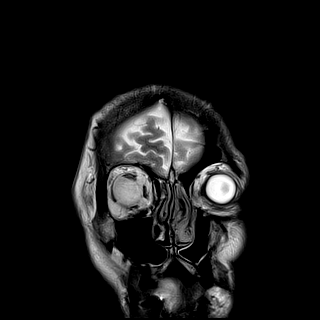

[Series 25: T1 post-contrast · coronal · 5.0mm · 0.34mm/px · 2 of 28 slices shown (1 of 2)]
[im 1/28]
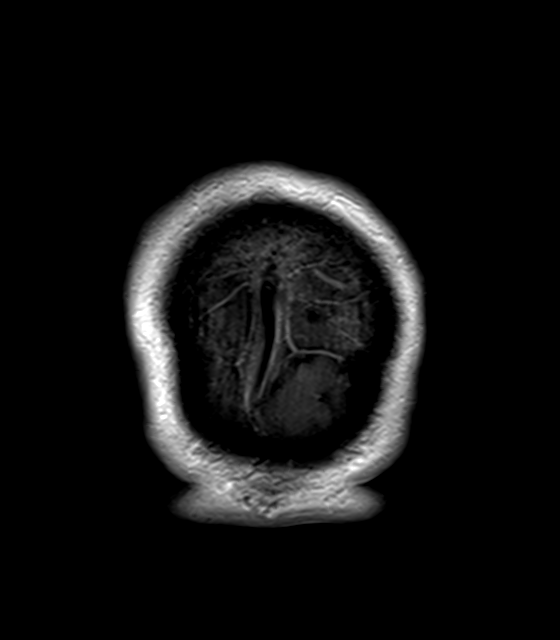
[im 28/28]
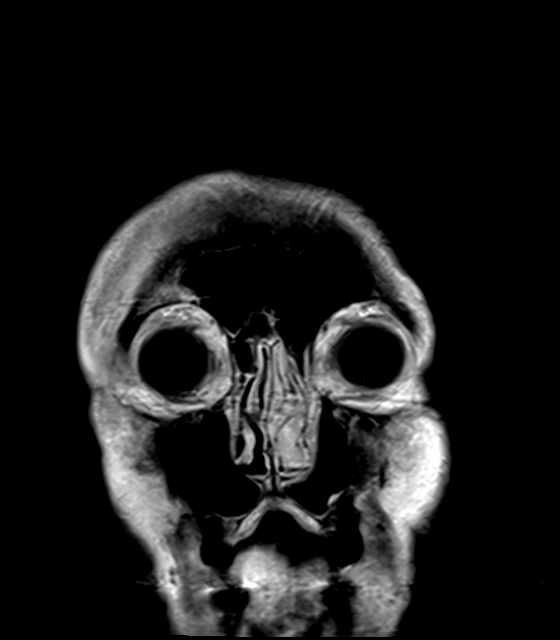

[Series 26: T1 post-contrast · sagittal · 5.0mm · 0.72mm/px · 1 of 25 slices shown (2 of 2)]
[im 1/25]
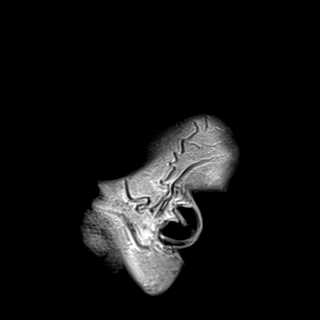

[42 of 48 positions shown; findings below may reference images not displayed]

FINDINGS: Motion limited exam.

Brain: No acute infarction, hemorrhage, hydrocephalus, extra-axial
collection or mass lesion. Motion limits evaluation of the
hippocampi, which appear grossly symmetric and normal in signal.
Mild scattered T2/FLAIR hyperintensities, likely related to chronic
microvascular ischemic disease. No abnormal enhancement.

Vascular: Major arterial flow voids are maintained at the skull
base.

Skull and upper cervical spine: Normal marrow signal. Right
frontal/periorbital contusion.

Sinuses/Orbits: Opacification of a posterior right ethmoid air cell.
Otherwise, sinuses are clear. Unremarkable orbits.

Other: Nonenhancing 1.2 cm cyst in the midline posterior nasopharynx
subjacent to the mucosa, compatible with a Tornwaldt cyst.
IMPRESSION: 1. No evidence of acute intracranial abnormality on this motion
limited exam.
2. Right frontal/periorbital contusion.
3. Incidental 1.2 cm nasopharyngeal Tornwaldt cyst.

## 2020-05-05 MED ORDER — LAMOTRIGINE 100 MG PO TABS
200.0000 mg | ORAL_TABLET | Freq: Every day | ORAL | Status: DC
  Administered 2020-05-05: 100 mg via ORAL
  Filled 2020-05-05 (×3): qty 2

## 2020-05-05 MED ORDER — GADOBUTROL 1 MMOL/ML IV SOLN
7.0000 mL | Freq: Once | INTRAVENOUS | Status: AC | PRN
  Administered 2020-05-05: 7 mL via INTRAVENOUS

## 2020-05-05 MED ORDER — LEVETIRACETAM IN NACL 500 MG/100ML IV SOLN
500.0000 mg | Freq: Two times a day (BID) | INTRAVENOUS | Status: DC
  Administered 2020-05-05: 500 mg via INTRAVENOUS
  Filled 2020-05-05 (×2): qty 100

## 2020-05-05 MED ORDER — SODIUM CHLORIDE 0.9 % IV SOLN
3.0000 g | Freq: Once | INTRAVENOUS | Status: AC
  Administered 2020-05-05: 3 g via INTRAVENOUS
  Filled 2020-05-05: qty 3

## 2020-05-05 NOTE — Progress Notes (Signed)
Subjective: No further events overnight.  Patient states he occasionally had these episodes where things were denied way) reaction which is what happened yesterday.  And then he woke up in the ED.  ROS: Unable to obtain due to poor mental status  Examination  Vital signs in last 24 hours: Temp:  [97.9 F (36.6 C)-99.4 F (37.4 C)] 98.7 F (37.1 C) (01/05 0743) Pulse Rate:  [61-94] 61 (01/05 0900) Resp:  [11-24] 18 (01/05 0900) BP: (129-173)/(73-112) 129/87 (01/05 0900) SpO2:  [94 %-100 %] 95 % (01/05 0900) Weight:  [68.9 kg] 68.9 kg (01/05 0100)  General: lying in bed, not in apparent distress, right eye ecchymosis and periorbital edema CVS: pulse-normal rate and rhythm RS: breathing comfortably, CTA B Extremities: normal, warm Neuro: AOx3, following all commands, no evidence of aphasia, cranial nerves II to XII grossly intact, spontaneously moving all extremities in bed  Basic Metabolic Panel: Recent Labs  Lab 05/04/20 1826  NA 135  K 3.9  CL 102  CO2 20*  GLUCOSE 173*  BUN 15  CREATININE 1.08  CALCIUM 9.3   CBC: Recent Labs  Lab 05/04/20 1826  WBC 10.3  NEUTROABS 9.2*  HGB 16.0  HCT 48.2  MCV 89.3  PLT 237     Coagulation Studies: No results for input(s): LABPROT, INR in the last 72 hours.  Imaging  MRI brain with and without contrast: No acute abnormality. Right frontal/periorbital contusion.  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  Suspected seizure Bipolar disorder -Routine EEG did not show any evidence of epileptogenicity -MRI brain with and without contrast did not show any acute abnormality. -The semiology of the episodes does sound concerning for seizures.  It is possible that patient may have underlying epilepsy that has  been treated with Depakote in the past and lamotrigine after that.  Although less likely, it could also be dizziness due to lamotrigine  Recommendations -Patient on wife reported missing some doses of lamotrigine recently, increased stress and less sleep during the holidays which precipitated this episode.  Unfortunately lamotrigine level has been ordered but results are not back yet.  -Recommend continuing lamotrigine 200 mg nightly.   -Discussed with patient and wife to be compliant with medication, maintain a diary of episodes. If episodes recur, recommend checking lamotrigine level and if the episodes are happening when the lamotrigine level is low then we can consider increasing lamotrigine.  If lamotrigine level is already higher, then would recommend epilepsy monitoring unit admission for characterization of these episodes and possibly adding a second AED like oxcarbazepine that has mood stabilizing properties. -We will discontinue Keppra as patient's episodes have resolved -Continue seizure precautions including do not drive for 6 months/until cleared by physician -Recommend follow-up with outpatient neurology in 10 to 12 weeks  I have spent a total of  35 minutes with the patient reviewing hospital notes,  test results, labs and examining the patient as well as establishing an assessment and plan.  > 50% of time was spent in direct patient care.   Lindie Spruce Epilepsy [facility] For questions after 5pm please refer to AMION to reach the Neurologist on call

## 2020-05-05 NOTE — ED Notes (Signed)
This RN spoke to provider about discharge/admit. Pt states that he feels good and ready to go home. Provider made aware and states ok to discharge. Pt alert and oriented x4. No distress noted.

## 2020-05-05 NOTE — Consult Note (Signed)
Neurology Consultation Reason for Consult: Concern for seizure Referring Physician: Anitra Lauth, W  CC: Fall  History is obtained from: Wife, patient  HPI: Matthew [name] is a 65 y.o. male with a history of bipolar disorder who was on Depakote for many years, but developed tremor and Depakote was discontinued 2018.  His wife reports that for several years (unclear to me if related to the timing of Depakote) he has had 1-2 episodes a year where he states that his eyes feel like they are looking in different directions.  She states that he will stare into the distance while this is happening, make an abnormal breathing type sound.  Each episode is fairly identical.  The patient states that it feels like his eyes are looking in different directions, but the patient's wife states that she has looked and cannot see any evidence of disconjugate gaze.  He occasionally will rapidly blink his eyes during one of these episodes.  They happen once or twice a year up until the past week.  Over the past week he has had one daily and then today he has had multiple occurring once every other hour or so.  This afternoon, he was in the kitchen, and she noticed him look to the right with his head, in an abnormal fashion.  He then looked back across to the left and stumbled and then fell at that time.  She states that he was moving, though she did not take any note of the character of the movements nor whether he was stiff or not.  He was breathing sonorously.   He has had some confusion after that, but is gradually improved.  The patient had no memory of passing out and therefore is unable to give any antecedent history.  He does remember, however, having multiple spells.  He takes lamotrigine, but states that he may have missed a few doses over the past week.  His wife also reports that he has not been sleeping well over the past week.    ROS: A 14 point ROS was performed and is negative except as noted in the HPI.    Past Medical History:  Diagnosis Date  . Anxiety   . Bipolar I disorder, most recent episode (or current) mixed, moderate   . GERD (gastroesophageal reflux disease)   . Hypertension   . Migraines   . OSA (obstructive sleep apnea)    retested-bipap not needed  . Panic attacks   . Substance abuse (HCC)      Family History  Problem Relation Age of Onset  . Depression Mother   . Hypertension Father   . Cancer Father 57       brain tumor  . Heart disease Paternal Grandmother   . Arthritis Other   . Hypertension Other      Social History:  reports that he has been smoking pipe and cigars. He has never used smokeless tobacco. He reports that he does not drink alcohol and does not use drugs.   Exam: Current vital signs: BP (!) 153/91 (BP Location: Right Arm)   Pulse 82   Temp 99.4 F (37.4 C) (Oral)   Resp 12   SpO2 98%  Vital signs in last 24 hours: Temp:  [97.9 F (36.6 C)-99.4 F (37.4 C)] 99.4 F (37.4 C) (01/05 0130) Pulse Rate:  [77-94] 82 (01/05 0200) Resp:  [11-23] 12 (01/05 0200) BP: (146-173)/(88-112) 153/91 (01/05 0200) SpO2:  [94 %-98 %] 98 % (01/05 0200)   Physical  Exam  Constitutional: Appears well-developed and well-nourished.  Psych: Affect appropriate to situation Eyes: No scleral injection HENT: No OP obstrucion MSK: no joint deformities.  Cardiovascular: Normal rate and regular rhythm.  Respiratory: Effort normal, non-labored breathing GI: Soft.  No distension. There is no tenderness.  Skin: WDI  Neuro: Mental Status: Patient is lethargic but arousable.  He is able to give me the year, initially gives the incorrect month but corrects himself.  He knows that he is at a hospital, but not which one.  Cranial Nerves: II: Visual Fields are full. Pupils are equal, round, and reactive to light.   III,IV, VI: EOMI in the left eye, his right eye is swollen shut V: Facial sensation is symmetric to temperature VII: Facial movement is symmetric.   VIII: hearing is intact to voice X: Uvula elevates symmetrically XI: Shoulder shrug is symmetric. XII: tongue is midline without atrophy or fasciculations.  Motor: Tone is normal. Bulk is normal. 5/5 strength was present in all four extremities.  Sensory: Sensation is symmetric to light touch and temperature in the arms and legs. Cerebellar: He does have some coarse tremor bilaterally.   I have reviewed labs in epic and the results pertinent to this consultation are: Lithium level less than 0.06  I have reviewed the images obtained: CT head-negative  Impression: 65 year old male with recurrent stereotyped episodes going back at least a few years with an increase in frequency followed by an episode of LOC with the description most consistent with seizure.  He is already on an antiepileptic with lamotrigine, though given that his lithium level is low, I wonder if he is also not taking his lamotrigine.  He has not missed many doses, however, per report and therefore I do think restarting at his home dose is appropriate.  Also, his recent lack of sleep due to the holidays/travel, etc. could be playing a role in increased frequency.  Recommendations: 1) lamotrigine level 2) lamotrigine 200 mg nightly, would add 50 mg every morning 3) Keppra 500 twice daily for now, though would discontinue it after a couple of days of Lamictal to try to use monotherapy. 4) MRI brain with and without contrast 5) EEG  Ritta Slot, MD [facility] [phone]  If 7pm- 7am, please page neurology on call as listed in [facility].

## 2020-05-05 NOTE — Evaluation (Signed)
Physical Therapy Evaluation Patient Details Name: Matthew [name] MRN: [id number] DOB: Jan 17, 1956 Today's Date: 05/05/2020   History of Present Illness  Pt is a 65 y/o male admitted secondary to possible seizure activity and fall. PMH includes bipolar disorder, HTN, and panic attacks.  Clinical Impression  Pt admitted secondary to problem above with deficits below. Pt requiring min to mod A to maintain balance once sitting EOB secondary to dizziness. Pt very emotional and requiring cues for pursed lip breathing. Attempted X2 and on second attempt, pt reports he felt like he was going to pass out. BP at 174/113. RN notified. Unable to perform further mobility safely. Feel pt will require further acute PT prior to d/c home; may benefit from vestibular therapies given symptoms. Will continue to follow acutely to maximize functional mobility independence and safety.     Follow Up Recommendations Other (comment);Supervision for mobility/OOB (TBD pending progression)    Equipment Recommendations  Other (comment) (TBD pending progression)    Recommendations for Other Services       Precautions / Restrictions Precautions Precautions: Fall Restrictions Weight Bearing Restrictions: No      Mobility  Bed Mobility Overal bed mobility: Needs Assistance Bed Mobility: Supine to Sit;Sit to Supine     Supine to sit: Min assist;Mod assist Sit to supine: Supervision   General bed mobility comments: Min to mod A for steadying once sitting upright secondary to dizziness. Pt unable to keep eyes open and presenting with posterior lean. Attempted X2 and on second attempt pt reports he felt like he would pass out. Checked BP and BP at 174/113. RN notified. Further mobility deferred.    Transfers                 General transfer comment: unable  Ambulation/Gait                Stairs            Wheelchair Mobility    Modified Rankin (Stroke Patients Only)       Balance  Overall balance assessment: Needs assistance Sitting-balance support: Bilateral upper extremity supported Sitting balance-Leahy Scale: Poor Sitting balance - Comments: Reliant on BUE support and at least min A to maintain balance secondary to dizziness.                                     Pertinent Vitals/Pain Pain Assessment: No/denies pain    Home Living Family/patient expects to be discharged to:: Private residence Living Arrangements: Spouse/significant other Available Help at Discharge: Family Type of Home: House Home Access: Level entry     Home Layout: Two level;Able to live on main level with bedroom/bathroom Home Equipment: None      Prior Function Level of Independence: Independent         Comments: Very active at baseline. Likes to fish     Hand Dominance        Extremity/Trunk Assessment   Upper Extremity Assessment Upper Extremity Assessment: Overall WFL for tasks assessed    Lower Extremity Assessment Lower Extremity Assessment: Overall WFL for tasks assessed    Cervical / Trunk Assessment Cervical / Trunk Assessment: Normal  Communication   Communication: No difficulties  Cognition Arousal/Alertness: Awake/alert Behavior During Therapy: WFL for tasks assessed/performed Overall Cognitive Status: Within Functional Limits for tasks assessed  General Comments      Exercises     Assessment/Plan    PT Assessment Patient needs continued PT services  PT Problem List Decreased balance;Decreased activity tolerance;Decreased mobility;Decreased knowledge of precautions       PT Treatment Interventions Gait training;Stair training;Functional mobility training;Therapeutic activities;Therapeutic exercise;Balance training;Patient/family education;Neuromuscular re-education    PT Goals (Current goals can be found in the Care Plan section)  Acute Rehab PT Goals Patient Stated  Goal: to go fly fishing PT Goal Formulation: With patient Time For Goal Achievement: 05/19/20 Potential to Achieve Goals: Good    Frequency Min 3X/week   Barriers to discharge        Co-evaluation               AM-PAC PT "6 Clicks" Mobility  Outcome Measure Help needed turning from your back to your side while in a flat bed without using bedrails?: A Little Help needed moving from lying on your back to sitting on the side of a flat bed without using bedrails?: A Lot Help needed moving to and from a bed to a chair (including a wheelchair)?: A Lot Help needed standing up from a chair using your arms (e.g., wheelchair or bedside chair)?: A Lot Help needed to walk in hospital room?: A Lot Help needed climbing 3-5 steps with a railing? : A Lot 6 Click Score: 13    End of Session   Activity Tolerance: Treatment limited secondary to medical complications (Comment) (dizziness) Patient left: in bed;with call bell/phone within reach;with family/visitor present (on stretcher in E D) Nurse Communication: Mobility status;Other (comment) (pt with dizziness and elevated BP) PT Visit Diagnosis: Unsteadiness on feet (R26.81);Dizziness and giddiness (R42);History of falling (Z91.81)    Time: 9678-9381 PT Time Calculation (min) (ACUTE ONLY): 25 min   Charges:   PT Evaluation $PT Eval Moderate Complexity: 1 Mod PT Treatments $Therapeutic Activity: 8-22 mins        Matthew [name], DPT  Acute Rehabilitation Services  Pager: [phone] Office: [phone]   Matthew [name] 05/05/2020, 11:01 AM

## 2020-05-05 NOTE — ED Notes (Signed)
Pt alert and oriented. Wife at bedside. Pt reporting headache 6/10. Pt passed swallow screen. Pt given socks and warm blanket. Ice pack applied to eye.

## 2020-05-05 NOTE — Progress Notes (Signed)
This pt passed [facility] stroke swallow screen. Per protocol RN can initiate diet. No SLP swallow eval needed at this time. Will f/u for cognitive linguistic eval when appropriate.  Harlon Ditty, MA CCC-SLP  Acute Rehabilitation Services Pager [phone] Office [phone]

## 2020-05-05 NOTE — Procedures (Signed)
Patient Name: ERVIE MCCARD  MRN: [id number]  Epilepsy Attending: Charlsie Quest  Referring Physician/Provider: Dr Ritta Slot Date: 05/05/2020 Duration: 24.23 mins  Patient history: 47 old male with episodes where he looks to the right with his head stumbles and falls.  EEG data for seizures.  Level of alertness: Awake, asleep  AEDs during EEG study: Lamotrigine, Keppra  Technical aspects: This EEG study was done with scalp electrodes positioned according to the 10-20 International system of electrode placement. Electrical activity was acquired at a sampling rate of 500Hz  and reviewed with a high frequency filter of 70Hz  and a low frequency filter of 1Hz . EEG data were recorded continuously and digitally stored.   Description: The posterior dominant rhythm consists of 8-9 Hz activity of moderate voltage (25-35 uV) seen predominantly in posterior head regions, symmetric and reactive to eye opening and eye closing. Sleep was characterized by vertex waves, sleep spindles (12 to 14 Hz), maximal frontocentral region.  Hyperventilation did not show any EEG change.  Physiologic photic driving was seen during photic stimulation.    IMPRESSION: This study is within normal limits. No seizures or epileptiform discharges were seen throughout the recording.  Dexton Zwilling 

## 2020-05-05 NOTE — Discharge Summary (Signed)
Physician Discharge Summary  AVERI CACIOPPO RXV:[id number] DOB: 1955-08-16 DOA: 05/04/2020  PCP: Catha Gosselin, MD  Admit date: 05/04/2020 Discharge date: 05/05/2020  Admitted From: Home  Discharge disposition: Home   Recommendations for Outpatient Follow-Up:   . Follow up with your primary care provider in 1 to 2 weeks . Check CBC, BMP, magnesium in the next visit . Follow-up with [facility] in 10 to 12 weeks for seizure  Discharge Diagnosis:   Active Problems:   Bipolar I disorder [facility])   Essential hypertension   Fall Seizure disorder  Discharge Condition: Improved.  Diet recommendation:   Regular.  Wound care: None.  Code status: Full.   History of Present Illness:   Matthew [name] is a 65 y.o. male with medical history significant for bipolar disorder,, hypertension, history of migraine headaches, presented to the emergency department for chief concerns of fall and altered mental status.Patient was status post post ictal.  Patient had been having episodes of staring intermittently and had been forgetting to take his medications.  Patient had been brought in the hospital after witnessed episode of vacant stare followed by stumble. Spouse reported that patient then fell and hit his head on dining room furniture and lamps. There is a possibility that patient may have forgotten to take his medications. Initially in the ED patient was combative and unable to follow commands at the time.  Per chart review, patient was in restraints and given 5 mg IM Versed, 2.5 mg IM Haldol.   Hospital Course:   Following conditions were addressed during hospitalization as listed below,  Fall and episodes of staring into space-suspicious for seizure Recurrent seizure  Likely secondary to missing doses of medication.  Patient received a loading dose of Keppra in the ED.  Neurology was consulted.  Patient was kept on seizure precautions.  Patient was continued on  lamotrigine same dose from home.  Compliance was emphasized.  Neurology recommended follow-up in 10 to 12 weeks and no driving until seen by neurology.  EEG was unremarkable.  Lamotrigine level was sent out  Bipolar disorder.-Continue lithium and lamotrigine from home..   Essential hypertension  Continue Coreg and amlodipine benazepril   Right eye lid swelling and ecchymosis-present on admission, CT of the maxillary region with right periorbital swelling.Ice to affected area as needed.  Migraine.  Resume home regimen.  Disposition.  At this time, patient is stable for disposition home.  Spoke with the patient's family at bedside  Medical Consultants:    Neurology  Procedures:    EEG Subjective:   Today, patient was seen and examined at bedside.  Denies any seizures dizziness lightheadedness but complains of mild headache.  He takes migraine medication at baseline  Discharge Exam:   Vitals:   05/05/20 0900 05/05/20 1000  BP: 129/87 (!) 144/90  Pulse: 61 (!) 56  Resp: 18 13  Temp:    SpO2: 95%    Vitals:   05/05/20 0743 05/05/20 0800 05/05/20 0900 05/05/20 1000  BP:  (!) 167/96 129/87 (!) 144/90  Pulse:  75 61 (!) 56  Resp:  (!) 24 18 13   Temp: 98.7 F (37.1 C)     TempSrc: Oral     SpO2:  96% 95%   Weight:      Height:       General: Alert awake, not in obvious distress HENT: pupils equally reacting to light,  No scleral pallor or icterus noted. Oral mucosa is moist.  Significant bruising around the  periorbital region on the right side.  Vision intact. Chest:  Clear breath sounds.  Diminished breath sounds bilaterally. No crackles or wheezes.  CVS: S1 &S2 heard. No murmur.  Regular rate and rhythm. Abdomen: Soft, nontender, nondistended.  Bowel sounds are heard.   Extremities: No cyanosis, clubbing or edema.  Peripheral pulses are palpable.  Right knee cap with mild erythema from recent fall. Psych: Alert, awake and oriented, normal mood CNS:  No cranial nerve  deficits.  Power equal in all extremities.   Skin: Warm and dry.  No rashes noted.  The results of significant diagnostics from this hospitalization (including imaging, microbiology, ancillary and laboratory) are listed below for reference.     Diagnostic Studies:   EEG  Result Date: 05/05/2020 Charlsie Quest, MD     05/05/2020 10:19 AM Patient Name: Matthew [name] MRN: [id number] Epilepsy Attending: Charlsie Quest Referring Physician/Provider: Dr Ritta Slot Date: 05/05/2020 Duration: 24.23 mins Patient history: 55 old male with episodes where he looks to the right with his head stumbles and falls.  EEG data for seizures. Level of alertness: Awake, asleep AEDs during EEG study: Lamotrigine, Keppra Technical aspects: This EEG study was done with scalp electrodes positioned according to the 10-20 International system of electrode placement. Electrical activity was acquired at a sampling rate of 500Hz  and reviewed with a high frequency filter of 70Hz  and a low frequency filter of 1Hz . EEG data were recorded continuously and digitally stored. Description: The posterior dominant rhythm consists of 8-9 Hz activity of moderate voltage (25-35 uV) seen predominantly in posterior head regions, symmetric and reactive to eye opening and eye closing. Sleep was characterized by vertex waves, sleep spindles (12 to 14 Hz), maximal frontocentral region.  Hyperventilation did not show any EEG change.  Physiologic photic driving was seen during photic stimulation.  IMPRESSION: This study is within normal limits. No seizures or epileptiform discharges were seen throughout the recording.   CT Head Wo Contrast  Result Date: 05/04/2020 CLINICAL DATA:  Altered level of consciousness, dizziness for several days, fell EXAM: CT HEAD WITHOUT CONTRAST TECHNIQUE: Contiguous axial images were obtained from the base of the skull through the vertex without intravenous contrast. COMPARISON:  None. FINDINGS:  Brain: Patient motion limits evaluation. No signs of acute infarct or hemorrhage. Lateral ventricles and midline structures are unremarkable. No acute extra-axial fluid collections. No mass effect. Vascular: No hyperdense vessel or unexpected calcification. Skull: Large right supraorbital scalp hematoma. No underlying fracture. The remainder of the calvarium is unremarkable. Sinuses/Orbits: No acute finding. Other: None. IMPRESSION: 1. Study limited by patient motion. 2. Right frontal scalp hematoma. 3. No acute intracranial process. Electronically Signed   By: M.D.   On: 05/04/2020 19:42   CT Cervical Spine Wo Contrast  Result Date: 05/04/2020 CLINICAL DATA:  Dizziness for several days, fell, altered level of consciousness, right frontal scalp hematoma. EXAM: CT CERVICAL SPINE WITHOUT CONTRAST TECHNIQUE: Multidetector CT imaging of the cervical spine was performed without intravenous contrast. Multiplanar CT image reconstructions were also generated. COMPARISON:  None. FINDINGS: Alignment: Alignment is grossly anatomic. Skull base and vertebrae: No acute fracture. No primary bone lesion or focal pathologic process. Soft tissues and spinal canal: No prevertebral fluid or swelling. No visible canal hematoma. Disc levels: Mild C2-3 and C5/C6 spondylosis. Prominent facet hypertrophic changes on the left at C2-3, and on the right at C3-4, C4-5, and C7-T1. There is mild symmetrical neural foraminal encroachment at C5-6. Upper chest: Airway  is patent.  Lung apices are clear. Other: Reconstructed images demonstrate no additional findings. IMPRESSION: 1. No acute cervical spine fracture. 2. Multilevel cervical spondylosis and facet hypertrophy, with mild symmetrical neural foraminal encroachment at C5-6. Electronically Signed   By: Randa Ngo M.D.   On: 05/04/2020 19:48   MR BRAIN W WO CONTRAST  Result Date: 05/05/2020 CLINICAL DATA:  Fall and possible seizure.  History of seizures. EXAM: MRI HEAD  WITHOUT AND WITH CONTRAST TECHNIQUE: Multiplanar, multiecho pulse sequences of the brain and surrounding structures were obtained without and with intravenous contrast. CONTRAST:  9mL GADAVIST GADOBUTROL 1 MMOL/ML IV SOLN COMPARISON:  CT May 04, 2020 FINDINGS: Motion limited exam. Brain: No acute infarction, hemorrhage, hydrocephalus, extra-axial collection or mass lesion. Motion limits evaluation of the hippocampi, which appear grossly symmetric and normal in signal. Mild scattered T2/FLAIR hyperintensities, likely related to chronic microvascular ischemic disease. No abnormal enhancement. Vascular: Major arterial flow voids are maintained at the skull base. Skull and upper cervical spine: Normal marrow signal. Right frontal/periorbital contusion. Sinuses/Orbits: Opacification of a posterior right ethmoid air cell. Otherwise, sinuses are clear. Unremarkable orbits. Other: Nonenhancing 1.2 cm cyst in the midline posterior nasopharynx subjacent to the mucosa, compatible with a Tornwaldt cyst. IMPRESSION: 1. No evidence of acute intracranial abnormality on this motion limited exam. 2. Right frontal/periorbital contusion. 3. 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 Electronically Signed   By: Margaretha Sheffield MD   On: 05/05/2020 07:26   DG Chest Port 1 View  Result Date: 05/04/2020 CLINICAL DATA:  65 year old male with altered mental status. EXAM: PORTABLE CHEST 1 VIEW COMPARISON:  Chest radiograph dated 01/09/2011. FINDINGS: The heart size and mediastinal contours are within normal limits. Both lungs are clear. The visualized skeletal structures are unremarkable. IMPRESSION: No active disease. Electronically Signed   By: Anner Crete M.D.   On: 05/04/2020 22:02   CT Maxillofacial Wo Contrast  Result Date: 05/04/2020 CLINICAL DATA:  Dizziness, altered level of consciousness, right periorbital hematoma EXAM: CT MAXILLOFACIAL WITHOUT CONTRAST TECHNIQUE: Multidetector CT imaging of the maxillofacial  structures was performed. Multiplanar CT image reconstructions were also generated. COMPARISON:  None. FINDINGS: Osseous: No fracture or mandibular dislocation. No destructive process. Orbits: Significant right periorbital soft tissue swelling. Right globe is intact. Left orbit is unremarkable. Sinuses: Minimal polypoid mucosal thickening right posterior ethmoid air cell. The remaining sinuses are clear. Soft tissues: Significant right periorbital soft tissue swelling extending to the right frontal scalp. Remaining soft tissues are unremarkable. Airway is patent. Limited intracranial: No significant or unexpected finding. IMPRESSION: 1. Right periorbital and right frontal scalp soft tissue swelling. No acute fractures. Electronically Signed   By: Randa Ngo M.D.   On: 05/04/2020 19:45     Labs:   Basic Metabolic Panel: Recent Labs  Lab 05/04/20 1826  NA 135  K 3.9  CL 102  CO2 20*  GLUCOSE 173*  BUN 15  CREATININE 1.08  CALCIUM 9.3   GFR Estimated Creatinine Clearance: 67.3 mL/min (by C-G formula based on SCr of 1.08 mg/dL). Liver Function Tests: Recent Labs  Lab 05/04/20 1826  AST 20  ALT 14  ALKPHOS 57  BILITOT 1.2  PROT 7.0  ALBUMIN 4.5   No results for input(s): LIPASE, AMYLASE in the last 168 hours. Recent Labs  Lab 05/04/20 2152  AMMONIA 37*   Coagulation profile No results for input(s): INR, PROTIME in the last 168 hours.  CBC: Recent Labs  Lab 05/04/20 1826  WBC 10.3  NEUTROABS 9.2*  HGB 16.0  HCT 48.2  MCV 89.3  PLT 237   Cardiac Enzymes: No results for input(s): CKTOTAL, CKMB, CKMBINDEX, TROPONINI in the last 168 hours. BNP: Invalid input(s): POCBNP CBG: No results for input(s): GLUCAP in the last 168 hours. D-Dimer No results for input(s): DDIMER in the last 72 hours. Hgb A1c No results for input(s): HGBA1C in the last 72 hours. Lipid Profile No results for input(s): CHOL, HDL, LDLCALC, TRIG, CHOLHDL, LDLDIRECT in the last 72  hours. Thyroid function studies No results for input(s): TSH, T4TOTAL, T3FREE, THYROIDAB in the last 72 hours.  Invalid input(s): FREET3 Anemia work up No results for input(s): VITAMINB12, FOLATE, FERRITIN, TIBC, IRON, RETICCTPCT in the last 72 hours. Microbiology Recent Results (from the past 240 hour(s))  Resp Panel by RT-PCR (Flu A&B, Covid) Nasopharyngeal Swab     Status: None   Collection Time: 05/04/20  6:47 PM   Specimen: Nasopharyngeal Swab; Nasopharyngeal(NP) swabs in vial transport medium  Result Value Ref Range Status   SARS Coronavirus 2 by RT PCR NEGATIVE NEGATIVE Final    Comment: (NOTE) SARS-CoV-2 target nucleic acids are NOT DETECTED.  The SARS-CoV-2 RNA is generally detectable in upper respiratory specimens during the acute phase of infection. The lowest concentration of SARS-CoV-2 viral copies this assay can detect is 138 copies/mL. A negative result does not preclude SARS-Cov-2 infection and should not be used as the sole basis for treatment or other patient management decisions. A negative result may occur with  improper specimen collection/handling, submission of specimen other than nasopharyngeal swab, presence of viral mutation(s) within the areas targeted by this assay, and inadequate number of viral copies(<138 copies/mL). A negative result must be combined with clinical observations, patient history, and epidemiological information. The expected result is Negative.  Fact Sheet for Patients:  BloggerCourse.com  Fact Sheet for Healthcare Providers:  SeriousBroker.it  This test is no t yet approved or cleared by the [place] FDA and  has been authorized for detection and/or diagnosis of SARS-CoV-2 by FDA under an Emergency Use Authorization (EUA). This EUA will remain  in effect (meaning this test can be used) for the duration of the COVID-19 declaration under Section 564(b)(1) of the Act,  21 U.S.C.section 360bbb-3(b)(1), unless the authorization is terminated  or revoked sooner.       Influenza A by PCR NEGATIVE NEGATIVE Final   Influenza B by PCR NEGATIVE NEGATIVE Final    Comment: (NOTE) The Xpert Xpress SARS-CoV-2/FLU/RSV plus assay is intended as an aid in the diagnosis of influenza from Nasopharyngeal swab specimens and should not be used as a sole basis for treatment. Nasal washings and aspirates are unacceptable for Xpert Xpress SARS-CoV-2/FLU/RSV testing.  Fact Sheet for Patients: BloggerCourse.com  Fact Sheet for Healthcare Providers: SeriousBroker.it  This test is not yet approved or cleared by the [place] FDA and has been authorized for detection and/or diagnosis of SARS-CoV-2 by FDA under an Emergency Use Authorization (EUA). This EUA will remain in effect (meaning this test can be used) for the duration of the COVID-19 declaration under Section 564(b)(1) of the Act, 21 U.S.C. section 360bbb-3(b)(1), unless the authorization is terminated or revoked.  Performed at [facility], [address] [place], [place] [address]      Discharge Instructions:   Discharge Instructions    Ambulatory referral to Neurology   Complete by: As directed    An appointment is requested in approximately: 10 weeks. Seizure followup   Diet general   Complete by:  As directed    Discharge instructions   Complete by: As directed    Follow-up with your primary care physician in 1 to 2 weeks.  Follow-up with neurologist as outpatient in 10 to 12 weeks.  Ambulatory referral to neurology has been made.  Continue to take your medications without interruption.  Please do not drive for next 6 months/until cleared by neurology.  Bruise in the eye will improve by itself   Increase activity slowly   Complete by: As directed      Allergies as of 05/05/2020      Reactions   Sumatriptan Other (See Comments)   (  Imitrex)- dizziness   Tramadol    Severe dizziness      Medication List    TAKE these medications   amLODipine-benazepril 5-20 MG capsule Commonly known as: LOTREL TAKE 1 CAPSULE BY MOUTH DAILY   carvedilol 25 MG tablet Commonly known as: COREG TAKE 1 TABLET BY MOUTH TWICE DAILY WITH A MEAL   lamoTRIgine 200 MG tablet Commonly known as: LAMICTAL   lithium carbonate 300 MG capsule Take 300 mg by mouth as directed. 1 in am and 1 - 2 at bedtime   naratriptan 2.5 MG tablet Commonly known as: AMERGE Take 2.5 mg by mouth as needed. Marland Kitchen   rOPINIRole 1 MG tablet Commonly known as: REQUIP   topiramate 200 MG tablet Commonly known as: TOPAMAX       Follow-up Information    Little, Caryn Bee, MD. Schedule an appointment as soon as possible for a visit in 1 week(s).   Specialty: Family Medicine Why: regular checkup Contact information: [address] [place] [address] [phone]                Time coordinating discharge: 39 minutes  Signed:  Esme Durkin  [facility] 05/05/2020, 12:11 PM

## 2020-05-05 NOTE — Progress Notes (Signed)
EEG completed, results pending. 

## 2020-05-05 NOTE — ED Notes (Signed)
Pt transported to MRI 

## 2020-05-06 LAB — LAMOTRIGINE LEVEL: Lamotrigine Lvl: <1 ug/mL — ABNORMAL LOW (ref 2.0–20.0)

## 2020-05-07 DIAGNOSIS — I10 Essential (primary) hypertension: Secondary | ICD-10-CM | POA: Diagnosis not present

## 2020-07-05 DIAGNOSIS — H5203 Hypermetropia, bilateral: Secondary | ICD-10-CM | POA: Diagnosis not present

## 2020-07-05 DIAGNOSIS — H52201 Unspecified astigmatism, right eye: Secondary | ICD-10-CM | POA: Diagnosis not present

## 2020-07-05 DIAGNOSIS — H2513 Age-related nuclear cataract, bilateral: Secondary | ICD-10-CM | POA: Diagnosis not present

## 2020-07-08 ENCOUNTER — Encounter: Payer: Self-pay | Admitting: Neurology

## 2020-07-08 ENCOUNTER — Ambulatory Visit: Payer: Medicare Other | Admitting: Neurology

## 2020-07-08 VITALS — BP 138/83 | HR 58 | Ht 70.0 in | Wt 155.0 lb

## 2020-07-08 DIAGNOSIS — G43009 Migraine without aura, not intractable, without status migrainosus: Secondary | ICD-10-CM | POA: Diagnosis not present

## 2020-07-08 DIAGNOSIS — G40009 Localization-related (focal) (partial) idiopathic epilepsy and epileptic syndromes with seizures of localized onset, not intractable, without status epilepticus: Secondary | ICD-10-CM | POA: Diagnosis not present

## 2020-07-08 NOTE — Patient Instructions (Addendum)
I had a long discussion with the patient regarding his episode of tonic eye deviation followed by loss of consciousness and fall possibly representing complex partial seizure and since he has a diagnosis of bipolar disorder and is already on lamotrigine I would recommend he continue Lamotrigine in  the current dose of 200 mg daily as the previous episode was likely triggered by medication noncompliance.  He will also continue on Topamax 200 mg daily for migraine prophylaxis which seems to be helping him well.  I recommend we check a repeat EEG to look for any silent seizures.  He was advised not to drive for 6 months since this episode as per [facility].  We also discussed seizure provoking stimuli like medication noncompliance, stimulants like alcohol, marijuana and drug abuse as well as sleep deprivation and extremes of exertions and dietary changes.  He will return for follow-up in 3 months or call earlier if necessary.  Seizure, Adult A seizure is a sudden burst of abnormal electrical and chemical activity in the brain. Seizures usually last from 30 seconds to 2 minutes.  What are the causes? Common causes of this condition include:  Fever or infection.  Problems that affect the brain. These may include: ? A brain or head injury. ? Bleeding in the brain. ? A brain tumor.  Low levels of blood sugar or salt.  Kidney problems or liver problems.  Conditions that are passed from parent to child (are inherited).  Problems with a substance, such as: ? Having a reaction to a drug or a medicine. ? Stopping the use of a substance all of a sudden (withdrawal).  A stroke.  Disorders that affect how you develop. Sometimes, the cause may not be known.  What increases the risk?  Having someone in your family who has epilepsy. In this condition, seizures happen again and again over time. They have no clear cause.  Having had a tonic-clonic seizure before. This type of seizure causes you  to: ? Tighten the muscles of the whole body. ? Lose consciousness.  Having had a head injury or strokes before.  Having had a lack of oxygen at birth. What are the signs or symptoms? There are many types of seizures. The symptoms vary depending on the type of seizure you have. Symptoms during a seizure  Shaking that you cannot control (convulsions) with fast, jerky movements of muscles.  Stiffness of the body.  Breathing problems.  Feeling mixed up (confused).  Staring or not responding to sound or touch.  Head nodding.  Eyes that blink, flutter, or move fast.  Drooling, grunting, or making clicking sounds with your mouth  Losing control of when you pee or poop. Symptoms before a seizure  Feeling afraid, nervous, or worried.  Feeling like you may vomit.  Feeling like: ? You are moving when you are not. ? Things around you are moving when they are not.  Feeling like you saw or heard something before (dj vu).  Odd tastes or smells.  Changes in how you see. You may see flashing lights or spots. Symptoms after a seizure  Feeling confused.  Feeling sleepy.  Headache.  Sore muscles. How is this treated? If your seizure stops on its own, you will not need treatment. If your seizure lasts longer than 5 minutes, you will normally need treatment. Treatment may include:  Medicines given through an IV tube.  Avoiding things, such as medicines, that are known to cause your seizures.  Medicines to prevent  seizures.  A device to prevent or control seizures.  Surgery.  A diet low in carbohydrates and high in fat (ketogenic diet). Follow these instructions at home: Medicines  Take over-the-counter and prescription medicines only as told by your doctor.  Avoid foods or drinks that may keep your medicine from working, such as alcohol. Activity  Follow instructions about driving, swimming, or doing things that would be dangerous if you had another seizure. Wait  until your doctor says it is safe for you to do these things.  If you live in the [place] ask your local department of motor vehicles when you can drive.  Get a lot of rest. Teaching others  Teach friends and family what to do when you have a seizure. They should: ? Help you get down to the ground. ? Protect your head and body. ? Loosen any clothing around your neck. ? Turn you on your side. ? Know whether or not you need emergency care. ? Stay with you until you are better.  Also, tell them what not to do if you have a seizure. Tell them: ? They should not hold you down. ? They should not put anything in your mouth.   General instructions  Avoid anything that gives you seizures.  Keep a seizure diary. Write down: ? What you remember about each seizure. ? What you think caused each seizure.  Keep all follow-up visits. Contact a doctor if:  You have another seizure or seizures. Call the doctor each time you have a seizure.  The pattern of your seizures changes.  You keep having seizures with treatment.  You have symptoms of being sick or having an infection.  You are not able to take your medicine. Get help right away if:  You have any of these problems: ? A seizure that lasts longer than 5 minutes. ? Many seizures in a row and you do not feel better between seizures. ? A seizure that makes it harder to breathe. ? A seizure and you can no longer speak or use part of your body.  You do not wake up right after a seizure.  You get hurt during a seizure.  You feel confused or have pain right after a seizure. These symptoms may be an emergency. Get help right away. Call your local emergency services (911 in the [place]  Do not wait to see if the symptoms will go away.  Do not drive yourself to the hospital. Summary  A seizure is a sudden burst of abnormal electrical and chemical activity in the brain. Seizures normally last from 30 seconds to 2 minutes.  Causes of  seizures include illness, injury to the head, low levels of blood sugar or salt, and certain conditions.  Most seizures will stop on their own in less than 5 minutes. Seizures that last longer than 5 minutes are a medical emergency and need treatment right away.  Many medicines are used to treat seizures. Take over-the-counter and prescription medicines only as told by your doctor. This information is not intended to replace advice given to you by your health care provider. Make sure you discuss any questions you have with your health care provider. Document Revised: 10/24/2019 Document Reviewed: 10/24/2019 Elsevier Patient Education  2021 [employer].

## 2020-07-08 NOTE — Progress Notes (Signed)
[facility] [address] [place]. [place] [address] [phone]       OFFICE CONSULT NOTE  [name] Date of Birth:  [birth year] Medical Record Number:  [id number]   Referring MD: Joycelyn Das  Reason for Referral: Seizure  HPI: [name] is a 65 year old Caucasian male seen today for initial office consultation visit for seizure.  History is obtained from the patient and review of electronic medical records and I have personally reviewed pertinent imaging films in PACS.  He has a past medical history of anxiety and bipolar disorder, migraines, hypertension, panic attacks and substance abuse.  He was seen in the ER on 05/04/2020 when he developed an episode in which he stated he his eyes deviated tonically to the left and it was uncomfortable he tried to look back across and walk and stumbled and fell and had a severe concussion with frontal and periorbital swelling with brief loss of consciousness.  Some confusion after that but gradually improved by the time he came to the ER.  On inquiry he and his wife admitted he had had for the last few well several episodes in which he felt subjectively his eyes were looking in different directions and he could not control them.  During these episodes her wife noticed that he would stare into the distance and make abnormal breathing type sound.  Episodes of fairly stereotypical occurring several times a year.  He would occasionally rapidly blink during these episodes.  Patient has a history of bipolar disorder and was on lamotrigine and lithium and had apparently stopped this medication a month prior to the last episode in January.  Is lithium and lamotrigine levels were undetectable on 05/05/2020.  EEG was normal.  MRI scan of the brain showed no acute brain abnormality.  There are mild changes of chronic small vessel disease.  Incidental 1.2 cm midline posterior nasopharynx cyst was noted.  Right frontoparietal and periorbital  contusion was also noted.  Patient also has history of migraine headaches and he takes Topamax 200 mg daily for prophylaxis it seems to have worked quite well and migraines in the down to once every couple of months.  He was recently seen by psychiatrist to seem quite content with his current medication for bipolar and did not suggest any changes.  Patient has no new complaints today.  He has no prior history of seizures, strokes, TIAs, significant head injury with loss of consciousness except the most recent one.  No childhood history of febrile seizures family history of epilepsy.  ROS:   14 system review of systems is positive for eye deviation, staring, twitching of eyelids, gait difficulty, fall, contusion all other systems negative  PMH:  Past Medical History:  Diagnosis Date  . Anxiety   . Bipolar I disorder, most recent episode (or current) mixed, moderate   . GERD (gastroesophageal reflux disease)   . Hypertension   . Migraines   . OSA (obstructive sleep apnea)    retested-bipap not needed  . Panic attacks   . Substance abuse (HCC)     Social History:  Social History   Socioeconomic History  . Marital status: Married    Spouse name: susan  . Number of children: Not on file  . Years of education: Not on file  . Highest education level: Not on file  Occupational History  . Occupation: Disability    Comment: Bipolar disorder  Tobacco Use  . Smoking status: Former Smoker    Types: Pipe,  Cigars  . Smokeless tobacco: Never Used  Substance and Sexual Activity  . Alcohol use: No  . Drug use: No    Types: Cocaine    Comment: past history of use-  . Sexual activity: Not on file  Other Topics Concern  . Not on file  Social History Narrative   Lives with wife   Right Handed   Drinks 3-4 cups caffeine daily      Social Determinants of Health   Financial Resource Strain: Not on file  Food Insecurity: Not on file  Transportation Needs: Not on file  Physical Activity:  Not on file  Stress: Not on file  Social Connections: Not on file  Intimate Partner Violence: Not on file    Medications:   Current Outpatient Medications on File Prior to Visit  Medication Sig Dispense Refill  . amLODipine-benazepril (LOTREL) 5-20 MG per capsule TAKE 1 CAPSULE BY MOUTH DAILY 90 capsule 1  . carvedilol (COREG) 25 MG tablet TAKE 1 TABLET BY MOUTH TWICE DAILY WITH A MEAL 180 tablet 1  . lamoTRIgine (LAMICTAL) 200 MG tablet     . lithium 300 MG capsule Take 300 mg by mouth as directed. 1 in am and 1 - 2 at bedtime    . naratriptan (AMERGE) 2.5 MG tablet Take 2.5 mg by mouth as needed. .    . rOPINIRole (REQUIP) 1 MG tablet     . topiramate (TOPAMAX) 200 MG tablet      No current facility-administered medications on file prior to visit.    Allergies:   Allergies  Allergen Reactions  . Sumatriptan Other (See Comments)    ( Imitrex)- dizziness  . Tramadol     Severe dizziness    Physical Exam General: well developed, well nourished middle-aged Caucasian male, seated, in no evident distress Head: head normocephalic and atraumatic.   Neck: supple with no carotid or supraclavicular bruits Cardiovascular: regular rate and rhythm, no murmurs Musculoskeletal: no deformity Skin:  no rash/petichiae Vascular:  Normal pulses all extremities  Neurologic Exam Mental Status: Awake and fully alert. Oriented to place and time. Recent and remote memory intact. Attention span, concentration and fund of knowledge appropriate. Mood and affect appropriate.  Cranial Nerves: Fundoscopic exam reveals sharp disc margins. Pupils equal, briskly reactive to light. Extraocular movements full without nystagmus. Visual fields full to confrontation. Hearing intact. Facial sensation intact. Face, tongue, palate moves normally and symmetrically.  Motor: Normal bulk and tone. Normal strength in all tested extremity muscles. Sensory.: intact to touch , pinprick , position and vibratory sensation.   Coordination: Rapid alternating movements normal in all extremities. Finger-to-nose and heel-to-shin performed accurately bilaterally. Gait and Station: Arises from chair without difficulty. Stance is normal. Gait demonstrates normal stride length and balance . Able to heel, toe and tandem walk with mild difficulty.  Reflexes: 1+ and symmetric. Toes downgoing.       ASSESSMENT: 65 year old Caucasian male with recurrent stereotypical episodes for a few years with descriptions likely suggestive of complex partial seizures.  Most of the episodes he has a subjective feeling of his eyes looking in different directions and his wife has witnessed him staring and making guttural sound.  Occasionally rapidly blink his eyes during these episodes..  In the last episode in January 2022 he had severe tonic deviation of his eyes to the left and he stumbled while walking in hit his head developed a contusion with brief loss of consciousness.  He had been on lamotrigine and lithium for bipolar  disorder which he had discontinued a month prior which may have triggered a more severe episode.     PLAN: I had a long discussion with the patient regarding his episode of tonic eye deviation followed by loss of consciousness and fall possibly representing complex partial seizure and since he has a diagnosis of bipolar disorder and is already on lamotrigine I would recommend he continue Lamotrigine in  the current dose of 200 mg daily as the previous episode was likely triggered by medication noncompliance.  He will also continue on Topamax 200 mg daily for migraine prophylaxis which seems to be helping him well.  I recommend we check a repeat EEG to look for any silent seizures.  He was advised not to drive for 6 months since this episode as per [facility].  We also discussed seizure provoking stimuli like medication noncompliance, stimulants like alcohol, marijuana and drug abuse as well as sleep deprivation and  extremes of exertions and dietary changes.  He will return for follow-up in 3 months or call earlier if necessary.  Greater than 50% time during this 45-minute consultation visit was spent on counseling and coordination of care about complex partial seizures and need for medication compliance and discussion about seizure provoking stimuli and answering questions Delia Heady, MD Note: This document was prepared with digital dictation and possible smart phrase technology. Any transcriptional errors that result from this process are unintentional.

## 2020-08-04 ENCOUNTER — Ambulatory Visit: Payer: Medicare Other | Admitting: Neurology

## 2020-08-04 DIAGNOSIS — G40009 Localization-related (focal) (partial) idiopathic epilepsy and epileptic syndromes with seizures of localized onset, not intractable, without status epilepticus: Secondary | ICD-10-CM

## 2020-08-09 NOTE — Progress Notes (Signed)
Kindly inform the patient that EEG study was normal

## 2020-08-10 ENCOUNTER — Encounter: Payer: Self-pay | Admitting: *Deleted

## 2020-10-28 ENCOUNTER — Encounter: Payer: Self-pay | Admitting: Neurology

## 2020-10-28 ENCOUNTER — Ambulatory Visit: Payer: Medicare Other | Admitting: Neurology

## 2020-10-28 VITALS — BP 123/75 | HR 52 | Ht 70.0 in | Wt 155.4 lb

## 2020-10-28 DIAGNOSIS — G40909 Epilepsy, unspecified, not intractable, without status epilepticus: Secondary | ICD-10-CM | POA: Diagnosis not present

## 2020-10-28 MED ORDER — TOPIRAMATE 200 MG PO TABS: 100.0000 mg | ORAL_TABLET | Freq: Every day | ORAL | 3 refills | Status: DC

## 2020-10-28 NOTE — Progress Notes (Signed)
[facility] [address] [place]. [place] [address] [phone]       OFFICE FLLOW UP VISIT NOTE  Mr. MASAO JUNKER Date of Birth:  1955/11/24 Medical Record Number:  [id number]   Referring MD: Joycelyn Das  Reason for Referral: Seizure  HPI: Initial visit 3/10/2022[name] is a 65 year old Caucasian male seen today for initial office consultation visit for seizure.  History is obtained from the patient and review of electronic medical records and I have personally reviewed pertinent imaging films in PACS.  He has a past medical history of anxiety and bipolar disorder, migraines, hypertension, panic attacks and substance abuse.  He was seen in the ER on 05/04/2020 when he developed an episode in which he stated he his eyes deviated tonically to the left and it was uncomfortable he tried to look back across and walk and stumbled and fell and had a severe concussion with frontal and periorbital swelling with brief loss of consciousness.  Some confusion after that but gradually improved by the time he came to the ER.  On inquiry he and his wife admitted he had had for the last few well several episodes in which he felt subjectively his eyes were looking in different directions and he could not control them.  During these episodes her wife noticed that he would stare into the distance and make abnormal breathing type sound.  Episodes of fairly stereotypical occurring several times a year.  He would occasionally rapidly blink during these episodes.  Patient has a history of bipolar disorder and was on lamotrigine and lithium and had apparently stopped this medication a month prior to the last episode in January.  Is lithium and lamotrigine levels were undetectable on 05/05/2020.  EEG was normal.  MRI scan of the brain showed no acute brain abnormality.  There are mild changes of chronic small vessel disease.  Incidental 1.2 cm midline posterior nasopharynx cyst was noted.  Right  frontoparietal and periorbital contusion was also noted.  Patient also has history of migraine headaches and he takes Topamax 200 mg daily for prophylaxis it seems to have worked quite well and migraines in the down to once every couple of months.  He was recently seen by psychiatrist to seem quite content with his current medication for bipolar and did not suggest any changes.  Patient has no new complaints today.  He has no prior history of seizures, strokes, TIAs, significant head injury with loss of consciousness except the most recent one.  No childhood history of febrile seizures family history of epilepsy. Update 10/28/2020 ; He returns for follow-up after last visit 3 months ago.  Is accompanied by his wife.  He is doing well and has not had any generalized seizures or staring episodes.  He remains on Lamictal 200 mg daily she is tolerating well without side effects.  He is also on Topamax 200 mg daily and his migraines are quite well controlled and occur only once every couple of months.  He is a decreased appetite and has been losing weight.  Patient continues to have mild short-term memory difficulties but these are not progressive.  He states his bipolar symptoms are quite stable and he has regular follow-up with his psychiatrist.  He had follow-up EEG on 08/05/2020 which was also normal.  He has no new complaints today. ROS:   14 system review of systems is positive for seizure, staring, twitching of eyelids, gait difficulty, fall, contusion all other systems negative  PMH:  Past Medical History:  Diagnosis Date   Anxiety    Bipolar I disorder, most recent episode (or current) mixed, moderate    GERD (gastroesophageal reflux disease)    Hypertension    Migraines    OSA (obstructive sleep apnea)    retested-bipap not needed   Panic attacks    Substance abuse (HCC)     Social History:  Social History   Socioeconomic History   Marital status: Married    Spouse name: susan   Number of  children: Not on file   Years of education: Not on file   Highest education level: Not on file  Occupational History   Occupation: Disability    Comment: Bipolar disorder  Tobacco Use   Smoking status: Former    Pack years: 0.00    Types: Pipe, Cigars   Smokeless tobacco: Never  Substance and Sexual Activity   Alcohol use: No   Drug use: No    Types: Cocaine    Comment: past history of use-   Sexual activity: Not on file  Other Topics Concern   Not on file  Social History Narrative   Lives with wife   Right Handed   Drinks 3-4 cups caffeine daily      Social Determinants of Health   Financial Resource Strain: Not on file  Food Insecurity: Not on file  Transportation Needs: Not on file  Physical Activity: Not on file  Stress: Not on file  Social Connections: Not on file  Intimate Partner Violence: Not on file    Medications:   Current Outpatient Medications on File Prior to Visit  Medication Sig Dispense Refill   amLODipine-benazepril (LOTREL) 5-20 MG per capsule TAKE 1 CAPSULE BY MOUTH DAILY 90 capsule 1   carvedilol (COREG) 25 MG tablet TAKE 1 TABLET BY MOUTH TWICE DAILY WITH A MEAL 180 tablet 1   lamoTRIgine (LAMICTAL) 200 MG tablet      lithium 300 MG capsule Take 300 mg by mouth as directed. 1 in am and 1 - 2 at bedtime     naratriptan (AMERGE) 2.5 MG tablet Take 2.5 mg by mouth as needed. Marland Kitchen     rOPINIRole (REQUIP) 1 MG tablet      No current facility-administered medications on file prior to visit.    Allergies:   Allergies  Allergen Reactions   Sumatriptan Other (See Comments)    ( Imitrex)- dizziness   Tramadol     Severe dizziness    Physical Exam General: well developed, well nourished middle-aged Caucasian male, seated, in no evident distress Head: head normocephalic and atraumatic.   Neck: supple with no carotid or supraclavicular bruits Cardiovascular: regular rate and rhythm, no murmurs Musculoskeletal: no deformity Skin:  no  rash/petichiae Vascular:  Normal pulses all extremities  Neurologic Exam Mental Status: Awake and fully alert. Oriented to place and time. Recent and remote memory intact. Attention span, concentration and fund of knowledge appropriate. Mood and affect appropriate.  Cranial Nerves: Fundoscopic exam reveals sharp disc margins. Pupils equal, briskly reactive to light. Extraocular movements full without nystagmus. Visual fields full to confrontation. Hearing intact. Facial sensation intact. Face, tongue, palate moves normally and symmetrically.  Motor: Normal bulk and tone. Normal strength in all tested extremity muscles. Sensory.: intact to touch , pinprick , position and vibratory sensation.  Coordination: Rapid alternating movements normal in all extremities. Finger-to-nose and heel-to-shin performed accurately bilaterally. Gait and Station: Arises from chair without difficulty. Stance is normal. Gait demonstrates normal stride length  and balance . Able to heel, toe and tandem walk with mild difficulty.  Reflexes: 1+ and symmetric. Toes downgoing.       ASSESSMENT: 65 year old Caucasian male with recurrent stereotypical episodes for a few years with descriptions likely suggestive of complex partial seizures.  Most of the episodes he has a subjective feeling of his eyes looking in different directions and his wife has witnessed him staring and making guttural sound.  Occasionally rapidly blink his eyes during these episodes..  In the last episode in January 2022 he had severe tonic deviation of his eyes to the left and he stumbled while walking in hit his head developed a contusion with brief loss of consciousness.  He had been on lamotrigine and lithium for bipolar disorder which he had discontinued a month prior which may have triggered a more severe episode.     PLAN: I had a long discussion with patient and his wife regarding his seizures which seem to be well controlled on the current  medication regimen of lamotrigine 200 mg daily.  Recommend he continue that and avoid seizure provoking stimuli like medication noncompliance, sleep deprivation and extremes of activity.  I also recommend he reduce the dose of Topamax  to 100 mg daily since his migraines seem quite infrequent and it may be contributing to his weight loss and memory difficulties.  He will return for follow-up in the future in 3 months or call earlier if necessary. Greater than 50% time during this 25-minute  visit was spent on counseling and coordination of care about complex partial seizures and need for medication compliance and discussion about seizure provoking stimuli and answering questions Delia Heady, MD Note: This document was prepared with digital dictation and possible smart phrase technology. Any transcriptional errors that result from this process are unintentional.

## 2020-10-28 NOTE — Patient Instructions (Signed)
I had a long discussion with patient and his wife regarding his seizures which seem to be well controlled on the current medication regimen of lamotrigine 200 mg daily.  Recommend he continue that and avoid seizure provoking stimuli like medication noncompliance, sleep deprivation and extremes of activity.  I also recommend he reduce the dose of Topamax 200 mg daily since his migraines seem quite infrequent and it may be contributing to his weight loss and memory difficulties.  He will return for follow-up in the future in 3 months or call earlier if necessary.

## 2020-12-15 ENCOUNTER — Encounter: Payer: Self-pay | Admitting: Gastroenterology

## 2021-01-06 ENCOUNTER — Other Ambulatory Visit: Payer: Self-pay

## 2021-01-06 ENCOUNTER — Ambulatory Visit (AMBULATORY_SURGERY_CENTER): Payer: Medicare Other

## 2021-01-06 VITALS — Ht 70.0 in | Wt 148.0 lb

## 2021-01-06 DIAGNOSIS — Z1211 Encounter for screening for malignant neoplasm of colon: Secondary | ICD-10-CM

## 2021-01-06 NOTE — Progress Notes (Signed)
Pre visit completed via phone call; patient verified name, DOB, and address;  No egg or soy allergy known to patient  No issues with past sedation with any surgeries or procedures Patient denies ever being told they had issues or difficulty with intubation  No FH of Malignant Hyperthermia No diet pills per patient No home 02 use per patient  No blood thinners per patient  Pt denies issues with constipation at this time;  No A fib or A flutter  EMMI video via MyChart  COVID 19 guidelines implemented in PV today with Pt and RN   Pt is fully vaccinated for Covid x 2 + booster;  Coupon given to pt in PV today, Code to Pharmacy and  NO PA's for preps discussed with pt In PV today  Discussed with pt there will be an out-of-pocket cost for prep and that varies from $0 to 70 +  dollars   Due to the COVID-19 pandemic we are asking patients to follow certain guidelines.  Pt aware of COVID protocols and [facility] guidelines   Patient reports dental implants;

## 2021-01-19 ENCOUNTER — Encounter: Payer: Medicare Other | Admitting: Gastroenterology

## 2021-01-19 ENCOUNTER — Encounter: Payer: Self-pay | Admitting: Internal Medicine

## 2021-02-04 ENCOUNTER — Encounter: Payer: Self-pay | Admitting: Internal Medicine

## 2021-02-04 ENCOUNTER — Ambulatory Visit (AMBULATORY_SURGERY_CENTER): Payer: Medicare Other | Admitting: Internal Medicine

## 2021-02-04 ENCOUNTER — Other Ambulatory Visit: Payer: Self-pay

## 2021-02-04 VITALS — BP 166/84 | HR 54 | Temp 99.1°F | Resp 13 | Ht 70.0 in | Wt 148.0 lb

## 2021-02-04 DIAGNOSIS — Z1211 Encounter for screening for malignant neoplasm of colon: Secondary | ICD-10-CM | POA: Diagnosis not present

## 2021-02-04 DIAGNOSIS — K5520 Angiodysplasia of colon without hemorrhage: Secondary | ICD-10-CM

## 2021-02-04 HISTORY — DX: Angiodysplasia of colon without hemorrhage: K55.20

## 2021-02-04 MED ORDER — SODIUM CHLORIDE 0.9 % IV SOLN: 500.0000 mL | Freq: Once | INTRAVENOUS | Status: DC

## 2021-02-04 NOTE — Op Note (Signed)
[facility] Patient Name: Matthew [name] Procedure Date: 02/04/2021 10:14 AM MRN: [id number] Endoscopist: Iva Boop , MD Age: 65 Referring MD:  Date of Birth: 20-May-1955 Gender: Male Account #: [id number] Procedure:                Colonoscopy Indications:              Screening for colorectal malignant neoplasm Medicines:                Propofol per Anesthesia, Monitored Anesthesia Care Procedure:                Pre-Anesthesia Assessment:                           - Prior to the procedure, a History and Physical                            was performed, and patient medications and                            allergies were reviewed. The patient's tolerance of                            previous anesthesia was also reviewed. The risks                            and benefits of the procedure and the sedation                            options and risks were discussed with the patient.                            All questions were answered, and informed consent                            was obtained. Prior Anticoagulants: The patient has                            taken no previous anticoagulant or antiplatelet                            agents. ASA Grade Assessment: II - A patient with                            mild systemic disease. After reviewing the risks                            and benefits, the patient was deemed in                            satisfactory condition to undergo the procedure.                           After obtaining informed consent, the colonoscope  was passed under direct vision. Throughout the                            procedure, the patient's blood pressure, pulse, and                            oxygen saturations were monitored continuously. The                            CF [id number] #[id number] was introduced through the anus                            and advanced to the the cecum, identified by                             appendiceal orifice and ileocecal valve. The                            colonoscopy was somewhat difficult due to                            significant looping. Successful completion of the                            procedure was aided by applying abdominal pressure.                            The patient tolerated the procedure well. The                            quality of the bowel preparation was good. The                            ileocecal valve, appendiceal orifice, and rectum                            were photographed. The bowel preparation used was                            Miralax via split dose instruction. Scope In: 10:38:24 AM Scope Out: 10:57:30 AM Scope Withdrawal Time: 0 hours 12 minutes 31 seconds  Total Procedure Duration: 0 hours 19 minutes 6 seconds  Findings:                 The perianal and digital rectal examinations were                            normal. Pertinent negatives include normal prostate                            (size, shape, and consistency).                           Multiple diverticula were found in the sigmoid  colon.                           External and internal hemorrhoids were found.                           A single small angiodysplastic lesion was found in                            the ascending colon.                           The exam was otherwise without abnormality on                            direct and retroflexion views. Complications:            No immediate complications. Estimated Blood Loss:     Estimated blood loss: none. Impression:               - Diverticulosis in the sigmoid colon.                           - External and internal hemorrhoids.                           - A single colonic angiodysplastic (AVM) lesion.                            50mm size, in ascending colon w/o bleeding                           - The examination was otherwise normal on direct                             and retroflexion views.                           - No specimens collected. Recommendation:           - Patient has a contact number available for                            emergencies. The signs and symptoms of potential                            delayed complications were discussed with the                            patient. Return to normal activities tomorrow.                            Written discharge instructions were provided to the                            patient.                           -  Resume previous diet.                           - Continue present medications.                           - Repeat colonoscopy in 10 years for screening                            purposes. Iva Boop, MD 02/04/2021 11:05:14 AM This report has been signed electronically.

## 2021-02-04 NOTE — Progress Notes (Signed)
Pt's states no medical or surgical changes since previsit or office visit. 

## 2021-02-04 NOTE — Progress Notes (Signed)
[facility] History and Physical   Primary Care Physician:  Soundra Pilon, FNP   Reason for Procedure:   Colon cancer screening  Plan:    colonoscopy     HPI: Matthew [name] is a 65 y.o. male here for screening colonoscopy   Past Medical History:  Diagnosis Date   Anxiety    on meds   Bipolar I disorder, most recent episode (or current) mixed, moderate    GERD (gastroesophageal reflux disease)    Hypertension    on meds   Migraines    OSA (obstructive sleep apnea)    retested-bipap not needed   Panic attacks    Seizures (HCC)    last known 05/2020;   Substance abuse (HCC)     Past Surgical History:  Procedure Laterality Date   CARPOMETACARPEL (CMC) FUSION OF THUMB WITH AUTOGRAFT FROM RADIUS Bilateral    COLONOSCOPY  2011   CG-in pt at [facility]-   FINGER ARTHROPLASTY Left 06/17/2012   Procedure: FINGER ARTHROPLASTY;  Surgeon: Jodi Marble, MD;  Location: [facility];  Service: Orthopedics;  Laterality: Left;  left thumb suspension arthroplasty    HEMORRHOID SURGERY  2000   Dr Earlene Plater   TONSILLECTOMY     WISDOM TOOTH EXTRACTION      Prior to Admission medications   Medication Sig Start Date End Date Taking? Authorizing Provider  amLODipine-benazepril (LOTREL) 5-20 MG per capsule TAKE 1 CAPSULE BY MOUTH DAILY 10/04/11  Yes Plotnikov, Georgina Quint, MD  carvedilol (COREG) 25 MG tablet TAKE 1 TABLET BY MOUTH TWICE DAILY WITH A MEAL 10/04/11  Yes Plotnikov, Georgina Quint, MD  clonazePAM (KLONOPIN) 0.5 MG tablet Take 0.5-1 mg by mouth daily as needed. 12/09/20  Yes [provider]  lamoTRIgine (LAMICTAL) 150 MG tablet Take 150 mg by mouth 2 (two) times daily. 12/09/20  Yes [provider]  lithium 300 MG capsule Take 300 mg by mouth as directed. 1 in am and 1 - 2 at bedtime   Yes [provider]  topiramate (TOPAMAX) 200 MG tablet Take 0.5 tablets (100 mg total) by mouth daily. 10/28/20  Yes Micki Riley, MD  naratriptan  (AMERGE) 2.5 MG tablet Take 2.5 mg by mouth as needed. .    [provider]  QUEtiapine (SEROQUEL) 400 MG tablet Take 400 mg by mouth at bedtime. 11/06/20   [provider]  rOPINIRole (REQUIP) 1 MG tablet Take 1 mg by mouth at bedtime. 07/01/18   [provider]    Current Outpatient Medications  Medication Sig Dispense Refill   amLODipine-benazepril (LOTREL) 5-20 MG per capsule TAKE 1 CAPSULE BY MOUTH DAILY 90 capsule 1   carvedilol (COREG) 25 MG tablet TAKE 1 TABLET BY MOUTH TWICE DAILY WITH A MEAL 180 tablet 1   clonazePAM (KLONOPIN) 0.5 MG tablet Take 0.5-1 mg by mouth daily as needed.     lamoTRIgine (LAMICTAL) 150 MG tablet Take 150 mg by mouth 2 (two) times daily.     lithium 300 MG capsule Take 300 mg by mouth as directed. 1 in am and 1 - 2 at bedtime     topiramate (TOPAMAX) 200 MG tablet Take 0.5 tablets (100 mg total) by mouth daily. 30 tablet 3   naratriptan (AMERGE) 2.5 MG tablet Take 2.5 mg by mouth as needed. Marland Kitchen     QUEtiapine (SEROQUEL) 400 MG tablet Take 400 mg by mouth at bedtime.     rOPINIRole (REQUIP) 1 MG tablet Take 1 mg by  mouth at bedtime.     Current Facility-Administered Medications  Medication Dose Route Frequency Provider Last Rate Last Admin   0.9 %  sodium chloride infusion  500 mL Intravenous Once Iva Boop, MD        Allergies as of 02/04/2021 - Review Complete 02/04/2021  Allergen Reaction Noted   Sumatriptan Other (See Comments)    Tramadol  06/17/2012    Family History  Problem Relation Age of Onset   Depression Mother    Hypertension Father    Cancer Father 5       brain tumor   Heart disease Paternal Grandmother    Arthritis Other    Hypertension Other    Colon polyps Neg Hx    Colon cancer Neg Hx    Esophageal cancer Neg Hx    Rectal cancer Neg Hx    Stomach cancer Neg Hx        Review of Systems:  All other review of systems negative except as mentioned in the HPI.  Physical Exam: Vital signs BP  (!) 146/82   Pulse (!) 52   Temp 99.1 F (37.3 C) (Temporal)   Ht 5\' 10"  (1.778 m)   Wt 148 lb (67.1 kg)   SpO2 99%   BMI 21.24 kg/m   General:   Alert,  Well-developed, well-nourished, pleasant and cooperative in NAD Lungs:  Clear throughout to auscultation.   Heart:  Regular rate and rhythm; no murmurs, clicks, rubs,  or gallops. Abdomen:  Soft, nontender and nondistended. Normal bowel sounds.   Neuro/Psych:  Alert and cooperative. Normal mood and affect. A and O x 3   @Shaneese Tait  , MD, [facility] [phone] (pager) 02/04/2021 10:27 AM@

## 2021-02-04 NOTE — Patient Instructions (Addendum)
No polyps or cancer were seen.  There is a spot of superficial blood vessels called an AVM also. Sometimes these can bleed - if hat is a problem it can be fixed but I th absence of problems we leave them alone.  You do have diverticulosis and hemorrhoids. Please read the handouts about these common conditions.  Next routine colonoscopy or other screening test in 10 years - 2032.  I appreciate the opportunity to care for you. Iva Boop, MD, FACG   YOU HAD AN ENDOSCOPIC PROCEDURE TODAY AT THE  ENDOSCOPY CENTER:   Refer to the procedure report that was given to you for any specific questions about what was found during the examination.  If the procedure report does not answer your questions, please call your gastroenterologist to clarify.  If you requested that your care partner not be given the details of your procedure findings, then the procedure report has been included in a sealed envelope for you to review at your convenience later.  YOU SHOULD EXPECT: Some feelings of bloating in the abdomen. Passage of more gas than usual.  Walking can help get rid of the air that was put into your GI tract during the procedure and reduce the bloating. If you had a lower endoscopy (such as a colonoscopy or flexible sigmoidoscopy) you may notice spotting of blood in your stool or on the toilet paper. If you underwent a bowel prep for your procedure, you may not have a normal bowel movement for a few days.  Please Note:  You might notice some irritation and congestion in your nose or some drainage.  This is from the oxygen used during your procedure.  There is no need for concern and it should clear up in a day or so.  SYMPTOMS TO REPORT IMMEDIATELY:  Following lower endoscopy (colonoscopy or flexible sigmoidoscopy):  Excessive amounts of blood in the stool  Significant tenderness or worsening of abdominal pains  Swelling of the abdomen that is new, acute  Fever of 100F or higher  For  urgent or emergent issues, a gastroenterologist can be reached at any hour by calling [phone]. Do not use MyChart messaging for urgent concerns.    DIET:  We do recommend a small meal at first, but then you may proceed to your regular diet.  Drink plenty of fluids but you should avoid alcoholic beverages for 24 hours.  ACTIVITY:  You should plan to take it easy for the rest of today and you should NOT DRIVE or use heavy machinery until tomorrow (because of the sedation medicines used during the test).    FOLLOW UP: Our staff will call the number listed on your records 48-72 hours following your procedure to check on you and address any questions or concerns that you may have regarding the information given to you following your procedure. If we do not reach you, we will leave a message.  We will attempt to reach you two times.  During this call, we will ask if you have developed any symptoms of COVID 19. If you develop any symptoms (ie: fever, flu-like symptoms, shortness of breath, cough etc.) before then, please call [phone].  If you test positive for Covid 19 in the 2 weeks post procedure, please call and report this information to [place].     SIGNATURES/CONFIDENTIALITY: You and/or your care partner have signed paperwork which will be entered into your electronic medical record.  These signatures attest to the fact that that the information  above on your After Visit Summary has been reviewed and is understood.  Full responsibility of the confidentiality of this discharge information lies with you and/or your care-partner.  

## 2021-02-04 NOTE — Progress Notes (Signed)
Pt Drowsy. VSS. To PACU, report to RN. No anesthetic complications noted.  

## 2021-02-08 ENCOUNTER — Telehealth: Payer: Self-pay

## 2021-02-08 NOTE — Telephone Encounter (Signed)
Left message on 2nd follow up call. 

## 2021-02-08 NOTE — Telephone Encounter (Signed)
Left message on follow up call. 

## 2021-02-10 ENCOUNTER — Encounter: Payer: Self-pay | Admitting: Adult Health

## 2021-02-10 ENCOUNTER — Ambulatory Visit: Payer: Medicare Other | Admitting: Adult Health

## 2021-02-10 VITALS — BP 151/82 | HR 49 | Ht 70.0 in | Wt 158.6 lb

## 2021-02-10 DIAGNOSIS — G43009 Migraine without aura, not intractable, without status migrainosus: Secondary | ICD-10-CM

## 2021-02-10 DIAGNOSIS — Z5181 Encounter for therapeutic drug level monitoring: Secondary | ICD-10-CM

## 2021-02-10 DIAGNOSIS — G40909 Epilepsy, unspecified, not intractable, without status epilepticus: Secondary | ICD-10-CM

## 2021-02-10 MED ORDER — LAMOTRIGINE 150 MG PO TABS: 150.0000 mg | ORAL_TABLET | Freq: Two times a day (BID) | ORAL | 5 refills | Status: DC

## 2021-02-10 MED ORDER — TOPIRAMATE 50 MG PO TABS: 50.0000 mg | ORAL_TABLET | Freq: Every day | ORAL | 5 refills | Status: DC

## 2021-02-10 NOTE — Progress Notes (Signed)
[facility] [address] [place]. [place] [address] [phone]       OFFICE FLLOW UP VISIT NOTE  [name] Date of Birth:  1956/01/23 Medical Record Number:  [id number]   Referring MD: Matthew [name]  Reason for Referral: Seizure  Chief Complaint  Patient presents with   Seizures    Rm 2, 6 month FU  " no seizure activity"      HPI:   Initial visit 07/08/2020 Dr. Pearlean Brownie: Matthew [name] is a 65 year old Caucasian male seen today for initial office consultation visit for seizure.  History is obtained from the patient and review of electronic medical records and I have personally reviewed pertinent imaging films in PACS.  He has a past medical history of anxiety and bipolar disorder, migraines, hypertension, panic attacks and substance abuse.  He was seen in the ER on 05/04/2020 when he developed an episode in which he stated he his eyes deviated tonically to the left and it was uncomfortable he tried to look back across and walk and stumbled and fell and had a severe concussion with frontal and periorbital swelling with brief loss of consciousness.  Some confusion after that but gradually improved by the time he came to the ER.  On inquiry he and his wife admitted he had had for the last few well several episodes in which he felt subjectively his eyes were looking in different directions and he could not control them.  During these episodes her wife noticed that he would stare into the distance and make abnormal breathing type sound.  Episodes of fairly stereotypical occurring several times a year.  He would occasionally rapidly blink during these episodes.  Patient has a history of bipolar disorder and was on lamotrigine and lithium and had apparently stopped this medication a month prior to the last episode in January.  Is lithium and lamotrigine levels were undetectable on 05/05/2020.  EEG was normal.  MRI scan of the brain showed no acute brain abnormality.  There  are mild changes of chronic small vessel disease.  Incidental 1.2 cm midline posterior nasopharynx cyst was noted.  Right frontoparietal and periorbital contusion was also noted.  Patient also has history of migraine headaches and he takes Topamax 200 mg daily for prophylaxis it seems to have worked quite well and migraines in the down to once every couple of months.  He was recently seen by psychiatrist to seem quite content with his current medication for bipolar and did not suggest any changes.  Patient has no new complaints today.  He has no prior history of seizures, strokes, TIAs, significant head injury with loss of consciousness except the most recent one.  No childhood history of febrile seizures family history of epilepsy. Update 10/28/2020 Dr. Pearlean Brownie; He returns for follow-up after last visit 3 months ago.  Is accompanied by his wife.  He is doing well and has not had any generalized seizures or staring episodes.  He remains on Lamictal 200 mg daily she is tolerating well without side effects.  He is also on Topamax 200 mg daily and his migraines are quite well controlled and occur only once every couple of months.  He is a decreased appetite and has been losing weight.  Patient continues to have mild short-term memory difficulties but these are not progressive.  He states his bipolar symptoms are quite stable and he has regular follow-up with his psychiatrist.  He had follow-up EEG on 08/05/2020 which was also normal.  He has no new complaints today.   Update 02/10/2021 JM: Returns for 78-month seizure follow-up.  Overall stable without any reoccurring seizure events.  Remains on lamotrigine (prior 200mg  daily but per pt, was changed to either 100mg  BID or 150mg  BID by Dr. - unable to verify this via epic).  No side effects.  Remains on topiramate 100 mg daily for migraine prophylaxis. No migraine occurrence since decreasing dosage from 200 mg to 100 mg and does report excellent improvement of his  memory - still not "perfect" but greatly improved.  Routinely followed by psychiatry and PCP.  No concerns today.    ROS:   14 system review of systems is positive for no complaints and all other systems negative  PMH:  Past Medical History:  Diagnosis Date   Anxiety    on meds   AVM (arteriovenous malformation) of colon 02/04/2021   Bipolar I disorder, most recent episode (or current) mixed, moderate    GERD (gastroesophageal reflux disease)    Hypertension    on meds   Migraines    OSA (obstructive sleep apnea)    retested-bipap not needed   Panic attacks    Seizures (HCC)    last known 05/2020;   Substance abuse (HCC)     Social History:  Social History   Socioeconomic History   Marital status: Married    Spouse name: susan   Number of children: Not on file   Years of education: Not on file   Highest education level: Not on file  Occupational History   Occupation: Disability    Comment: Bipolar disorder  Tobacco Use   Smoking status: Former    Types: Pipe, Cigars   Smokeless tobacco: Never  Vaping Use   Vaping Use: Never used  Substance and Sexual Activity   Alcohol use: No   Drug use: No    Types: Cocaine    Comment: past history of use-   Sexual activity: Not on file  Other Topics Concern   Not on file  Social History Narrative   Lives with wife   Right Handed   Drinks 3-4 cups caffeine daily      Social Determinants of Health   Financial Resource Strain: Not on file  Food Insecurity: Not on file  Transportation Needs: Not on file  Physical Activity: Not on file  Stress: Not on file  Social Connections: Not on file  Intimate Partner Violence: Not on file    Medications:   Current Outpatient Medications on File Prior to Visit  Medication Sig Dispense Refill   amLODipine-benazepril (LOTREL) 5-20 MG per capsule TAKE 1 CAPSULE BY MOUTH DAILY 90 capsule 1   carvedilol (COREG) 25 MG tablet TAKE 1 TABLET BY MOUTH TWICE DAILY WITH A MEAL 180 tablet  1   clonazePAM (KLONOPIN) 0.5 MG tablet Take 0.5-1 mg by mouth daily as needed.     lithium 300 MG capsule Take 300 mg by mouth as directed. 1 in am and 1 - 2 at bedtime     naratriptan (AMERGE) 2.5 MG tablet Take 2.5 mg by mouth as needed. Pearlean Brownie     QUEtiapine (SEROQUEL) 400 MG tablet Take 400 mg by mouth at bedtime.     rOPINIRole (REQUIP) 1 MG tablet Take 1 mg by mouth at bedtime.     No current facility-administered medications on file prior to visit.    Allergies:   Allergies  Allergen Reactions   Sumatriptan Other (See Comments)    (  Imitrex)- dizziness   Tramadol     Severe dizziness    Physical Exam Today's Vitals   02/10/21 1023  BP: (!) 151/82  Pulse: (!) 49  Weight: 158 lb 9.6 oz (71.9 kg)  Height: 5\' 10"  (1.778 m)   Body mass index is 22.76 kg/m.   General: well developed, well nourished very pleasant middle-aged Caucasian male, seated, in no evident distress Head: head normocephalic and atraumatic.   Neck: supple with no carotid or supraclavicular bruits Cardiovascular: regular rate and rhythm, no murmurs Musculoskeletal: no deformity Skin:  no rash/petichiae Vascular:  Normal pulses all extremities  Neurologic Exam Mental Status: Awake and fully alert. Oriented to place and time. Recent and remote memory intact. Attention span, concentration and fund of knowledge appropriate. Mood and affect appropriate.  Cranial Nerves: Pupils equal, briskly reactive to light. Extraocular movements full without nystagmus. Visual fields full to confrontation. Hearing intact. Facial sensation intact. Face, tongue, palate moves normally and symmetrically.  Motor: Normal bulk and tone. Normal strength in all tested extremity muscles. Sensory.: intact to touch , pinprick , position and vibratory sensation.  Coordination: Rapid alternating movements normal in all extremities. Finger-to-nose and heel-to-shin performed accurately bilaterally. Gait and Station: Arises from chair without  difficulty. Stance is normal. Gait demonstrates normal stride length and balance without use of assistive device. Able to heel, toe and tandem walk with mild difficulty.  Reflexes: 1+ and symmetric. Toes downgoing.       ASSESSMENT: 65 year old Caucasian male with recurrent stereotypical episodes for a few years with descriptions likely suggestive of complex partial seizures.  Most of the episodes he has a subjective feeling of his eyes looking in different directions and his wife has witnessed him staring and making guttural sound.  Occasionally rapidly blink his eyes during these episodes..  In the last episode in January 2022 he had severe tonic deviation of his eyes to the left and he stumbled while walking in hit his head developed a contusion with brief loss of consciousness.  He had been on lamotrigine and lithium for bipolar disorder which he had discontinued a month prior which may have triggered a more severe episode.  No additional episodes since that time.    PLAN:  -Continue lamotrigine - he will verify dosage once he returns home and call office with update -refill will be placed at that time -Obtain lamotrigine level and CMP today -Recommend he continue that and avoid seizure provoking stimuli like medication noncompliance, sleep deprivation and extremes of activity.   -Further decrease Topamax  to 50 mg daily since migraines infrequent and still some memory loss but overall greatly improved after decreasing dose previously from 200mg  to 100mg  daily    Follow-up in 6 months or call earlier if needed    CC:  February 2022, FNP   I spent 24 minutes of face-to-face and non-face-to-face time with patient.  This included previsit chart review, lab review, study review, order entry, electronic health record documentation, patient education and discussion regarding history of seizures and ongoing use of seizure prophylactic medication, ongoing use of topiramate and possible side  effects and answered all other questions to patient satisfaction  , AGNP-BC  [facility] [address] [place], [facility] [address]  Phone [phone] Fax [phone] Note: This document was prepared with digital dictation and possible smart phrase technology. Any transcriptional errors that result from this process are unintentional.

## 2021-02-10 NOTE — Patient Instructions (Signed)
Decresae topiramate to 50 mg daily -if your migraines return, please resume 100 mg dosing  Continue lamotrigine 150 mg twice daily for seizure prevention  Check lamotrigine level today and comprehensive metabolic panel    Follow-up in 6 months or call earlier if needed

## 2021-02-15 LAB — COMPREHENSIVE METABOLIC PANEL
ALT: 12 IU/L (ref 0–44)
AST: 15 IU/L (ref 0–40)
Albumin/Globulin Ratio: 2.5 — ABNORMAL HIGH (ref 1.2–2.2)
Albumin: 4.5 g/dL (ref 3.8–4.8)
Alkaline Phosphatase: 55 IU/L (ref 44–121)
BUN/Creatinine Ratio: 12 (ref 10–24)
BUN: 13 mg/dL (ref 8–27)
Bilirubin Total: 0.4 mg/dL (ref 0.0–1.2)
CO2: 24 mmol/L (ref 20–29)
Calcium: 9.5 mg/dL (ref 8.6–10.2)
Chloride: 107 mmol/L — ABNORMAL HIGH (ref 96–106)
Creatinine, Ser: 1.05 mg/dL (ref 0.76–1.27)
Globulin, Total: 1.8 g/dL (ref 1.5–4.5)
Glucose: 99 mg/dL (ref 70–99)
Potassium: 4.5 mmol/L (ref 3.5–5.2)
Sodium: 141 mmol/L (ref 134–144)
Total Protein: 6.3 g/dL (ref 6.0–8.5)
eGFR: 79 mL/min/{1.73_m2} (ref >59)

## 2021-02-15 LAB — LAMOTRIGINE LEVEL: Lamotrigine Lvl: 7.1 ug/mL (ref 2.0–20.0)

## 2021-02-22 ENCOUNTER — Telehealth: Payer: Self-pay | Admitting: *Deleted

## 2021-02-22 NOTE — Telephone Encounter (Signed)
Called patient and LVM informing him the recent lab work was satisfactory.  Please continue current treatment plan with lamotrigine and please call office or send a MyChart message letting us know your current dose of lamotrigine. Left office # for call back.

## 2021-02-24 NOTE — Telephone Encounter (Signed)
LVM #2 for patient requesting  call back with lamotrigine dose. Called wife, Darl Pikes on [place] and left her VM requesting the same information. Left office # on both VM.

## 2021-02-25 NOTE — Telephone Encounter (Signed)
Med list updated. NP notified.

## 2021-02-25 NOTE — Telephone Encounter (Signed)
Pt called  to relay information requested. Lamotrigine dosage is 150mg  in morning and 150mg  at night.

## 2021-02-28 MED ORDER — LAMOTRIGINE 150 MG PO TABS: 150.0000 mg | ORAL_TABLET | Freq: Two times a day (BID) | ORAL | 5 refills | Status: DC

## 2021-02-28 NOTE — Addendum Note (Signed)
Addended by: Ihor Austin L on: 02/28/2021 12:35 PM   Modules accepted: Orders

## 2021-07-05 ENCOUNTER — Encounter (HOSPITAL_BASED_OUTPATIENT_CLINIC_OR_DEPARTMENT_OTHER): Payer: Self-pay | Admitting: *Deleted

## 2021-07-05 ENCOUNTER — Inpatient Hospital Stay (HOSPITAL_BASED_OUTPATIENT_CLINIC_OR_DEPARTMENT_OTHER)
Admission: EM | Admit: 2021-07-05 | Discharge: 2021-07-07 | DRG: 101 | Disposition: A | Discharge status: Home / Self Care | Payer: Medicare Other | Attending: Neurology | Admitting: Neurology

## 2021-07-05 ENCOUNTER — Other Ambulatory Visit: Payer: Self-pay

## 2021-07-05 ENCOUNTER — Emergency Department (HOSPITAL_BASED_OUTPATIENT_CLINIC_OR_DEPARTMENT_OTHER): Payer: Medicare Other

## 2021-07-05 DIAGNOSIS — Z888 Allergy status to other drugs, medicaments and biological substances status: Secondary | ICD-10-CM

## 2021-07-05 DIAGNOSIS — I10 Essential (primary) hypertension: Secondary | ICD-10-CM | POA: Diagnosis present

## 2021-07-05 DIAGNOSIS — Z87891 Personal history of nicotine dependence: Secondary | ICD-10-CM | POA: Diagnosis not present

## 2021-07-05 DIAGNOSIS — Z20822 Contact with and (suspected) exposure to covid-19: Secondary | ICD-10-CM | POA: Diagnosis present

## 2021-07-05 DIAGNOSIS — Z96692 Finger-joint replacement of left hand: Secondary | ICD-10-CM | POA: Diagnosis present

## 2021-07-05 DIAGNOSIS — G2581 Restless legs syndrome: Secondary | ICD-10-CM | POA: Diagnosis present

## 2021-07-05 DIAGNOSIS — F410 Panic disorder [episodic paroxysmal anxiety] without agoraphobia: Secondary | ICD-10-CM | POA: Diagnosis present

## 2021-07-05 DIAGNOSIS — E785 Hyperlipidemia, unspecified: Secondary | ICD-10-CM | POA: Diagnosis present

## 2021-07-05 DIAGNOSIS — Z818 Family history of other mental and behavioral disorders: Secondary | ICD-10-CM | POA: Diagnosis not present

## 2021-07-05 DIAGNOSIS — K219 Gastro-esophageal reflux disease without esophagitis: Secondary | ICD-10-CM | POA: Diagnosis present

## 2021-07-05 DIAGNOSIS — Z8249 Family history of ischemic heart disease and other diseases of the circulatory system: Secondary | ICD-10-CM | POA: Diagnosis not present

## 2021-07-05 DIAGNOSIS — R4701 Aphasia: Secondary | ICD-10-CM | POA: Diagnosis present

## 2021-07-05 DIAGNOSIS — R001 Bradycardia, unspecified: Secondary | ICD-10-CM | POA: Diagnosis present

## 2021-07-05 DIAGNOSIS — G40209 Localization-related (focal) (partial) symptomatic epilepsy and epileptic syndromes with complex partial seizures, not intractable, without status epilepticus: Principal | ICD-10-CM | POA: Diagnosis present

## 2021-07-05 DIAGNOSIS — Z79899 Other long term (current) drug therapy: Secondary | ICD-10-CM

## 2021-07-05 DIAGNOSIS — G4733 Obstructive sleep apnea (adult) (pediatric): Secondary | ICD-10-CM | POA: Diagnosis present

## 2021-07-05 DIAGNOSIS — F419 Anxiety disorder, unspecified: Secondary | ICD-10-CM | POA: Diagnosis present

## 2021-07-05 DIAGNOSIS — F05 Delirium due to known physiological condition: Secondary | ICD-10-CM | POA: Diagnosis not present

## 2021-07-05 DIAGNOSIS — F3162 Bipolar disorder, current episode mixed, moderate: Secondary | ICD-10-CM | POA: Diagnosis present

## 2021-07-05 DIAGNOSIS — Z885 Allergy status to narcotic agent status: Secondary | ICD-10-CM | POA: Diagnosis not present

## 2021-07-05 DIAGNOSIS — I6389 Other cerebral infarction: Secondary | ICD-10-CM | POA: Diagnosis not present

## 2021-07-05 DIAGNOSIS — I639 Cerebral infarction, unspecified: Secondary | ICD-10-CM | POA: Diagnosis present

## 2021-07-05 LAB — CBC WITH DIFFERENTIAL/PLATELET
Abs Immature Granulocytes: 0.02 10*3/uL (ref 0.00–0.07)
Basophils Absolute: 0.1 10*3/uL (ref 0.0–0.1)
Basophils Relative: 1 %
Eosinophils Absolute: 0.1 10*3/uL (ref 0.0–0.5)
Eosinophils Relative: 1 %
HCT: 43.5 % (ref 39.0–52.0)
Hemoglobin: 14.5 g/dL (ref 13.0–17.0)
Immature Granulocytes: 0 %
Lymphocytes Relative: 12 %
Lymphs Abs: 1 10*3/uL (ref 0.7–4.0)
MCH: 31 pg (ref 26.0–34.0)
MCHC: 33.3 g/dL (ref 30.0–36.0)
MCV: 92.9 fL (ref 80.0–100.0)
Monocytes Absolute: 0.4 10*3/uL (ref 0.1–1.0)
Monocytes Relative: 4 %
Neutro Abs: 7.3 10*3/uL (ref 1.7–7.7)
Neutrophils Relative %: 82 %
Platelets: 237 10*3/uL (ref 150–400)
RBC: 4.68 MIL/uL (ref 4.22–5.81)
RDW: 13 % (ref 11.5–15.5)
WBC: 8.9 10*3/uL (ref 4.0–10.5)
nRBC: 0 % (ref 0.0–0.2)

## 2021-07-05 LAB — PROTIME-INR
INR: 1.1 (ref 0.8–1.2)
Prothrombin Time: 13.8 seconds (ref 11.4–15.2)

## 2021-07-05 LAB — COMPREHENSIVE METABOLIC PANEL
ALT: 14 U/L (ref 0–44)
AST: 15 U/L (ref 15–41)
Albumin: 4.6 g/dL (ref 3.5–5.0)
Alkaline Phosphatase: 51 U/L (ref 38–126)
Anion gap: 8 (ref 5–15)
BUN: 18 mg/dL (ref 8–23)
CO2: 26 mmol/L (ref 22–32)
Calcium: 9.6 mg/dL (ref 8.9–10.3)
Chloride: 105 mmol/L (ref 98–111)
Creatinine, Ser: 1.08 mg/dL (ref 0.61–1.24)
GFR, Estimated: >60 mL/min (ref >60)
Glucose, Bld: 114 mg/dL — ABNORMAL HIGH (ref 70–99)
Potassium: 4.3 mmol/L (ref 3.5–5.1)
Sodium: 139 mmol/L (ref 135–145)
Total Bilirubin: 1.1 mg/dL (ref 0.3–1.2)
Total Protein: 7.4 g/dL (ref 6.5–8.1)

## 2021-07-05 LAB — RAPID URINE DRUG SCREEN, HOSP PERFORMED
Amphetamines: NOT DETECTED
Barbiturates: NOT DETECTED
Benzodiazepines: NOT DETECTED
Cocaine: NOT DETECTED
Opiates: NOT DETECTED
Tetrahydrocannabinol: POSITIVE — AB

## 2021-07-05 LAB — URINALYSIS, ROUTINE W REFLEX MICROSCOPIC
Bilirubin Urine: NEGATIVE
Glucose, UA: NEGATIVE mg/dL
Ketones, ur: 15 mg/dL — AB
Leukocytes,Ua: NEGATIVE
Nitrite: NEGATIVE
Protein, ur: NEGATIVE mg/dL
Specific Gravity, Urine: 1.02 (ref 1.005–1.030)
pH: 7 (ref 5.0–8.0)

## 2021-07-05 LAB — ETHANOL: Alcohol, Ethyl (B): <10 mg/dL (ref <10)

## 2021-07-05 LAB — APTT: aPTT: 32 seconds (ref 24–36)

## 2021-07-05 LAB — RESP PANEL BY RT-PCR (FLU A&B, COVID) ARPGX2
Influenza A by PCR: NEGATIVE
Influenza B by PCR: NEGATIVE
SARS Coronavirus 2 by RT PCR: NEGATIVE

## 2021-07-05 LAB — URINALYSIS, MICROSCOPIC (REFLEX): WBC, UA: NONE SEEN WBC/hpf (ref 0–5)

## 2021-07-05 LAB — LITHIUM LEVEL: Lithium Lvl: 0.72 mmol/L (ref 0.60–1.20)

## 2021-07-05 LAB — CBG MONITORING, ED: Glucose-Capillary: 96 mg/dL (ref 70–99)

## 2021-07-05 IMAGING — CT CT ANGIO HEAD-NECK (W OR W/O PERF)
1 of 8 series · 6 of 33 positions shown · IV contrast (Omnipaque)
Comparison: Head CT earlier same day.  Brain MRI [DATE].

CLINICAL DATA: Neuro deficit, acute, stroke suspected. Confusion.
Word finding difficulty.

EXAM:
CT ANGIOGRAPHY HEAD AND NECK
TECHNIQUE: Multidetector CT imaging of the head and neck was performed using
the standard protocol during bolus administration of intravenous
contrast. Multiplanar CT image reconstructions and MIPs were
obtained to evaluate the vascular anatomy. Carotid stenosis
measurements (when applicable) are obtained utilizing NASCET
criteria, using the distal internal carotid diameter as the
denominator.

[Series 8: axial thin · axial · 0.48mm/px · z∈[+1090,+1335]mm · 6 of 343 slices shown]
[im 49/343  soft-tissue]
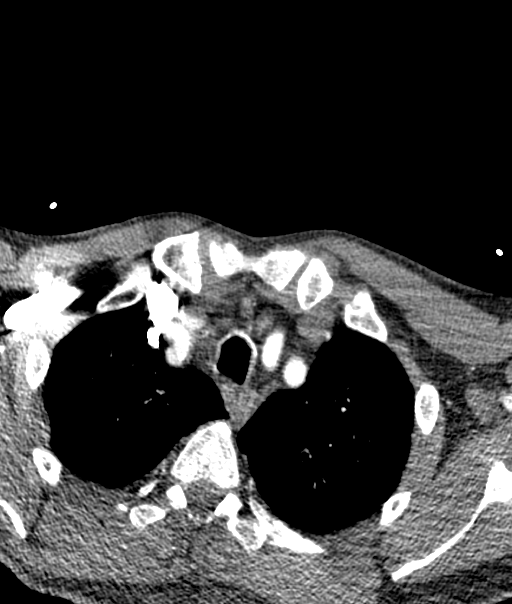
[im 98/343  bone]
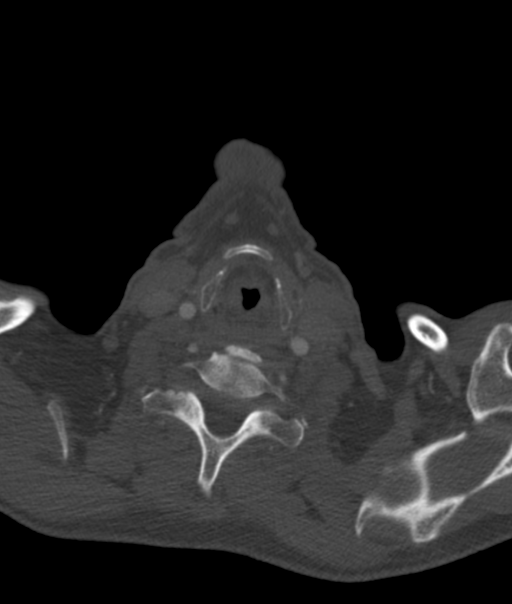
[im 147/343  soft-tissue]
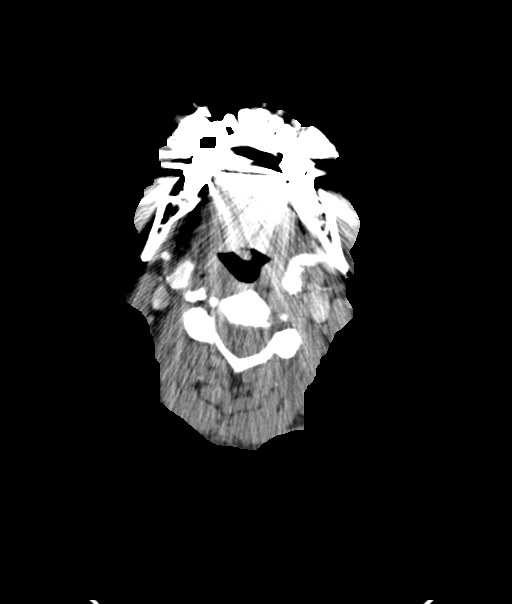
[im 196/343  bone]
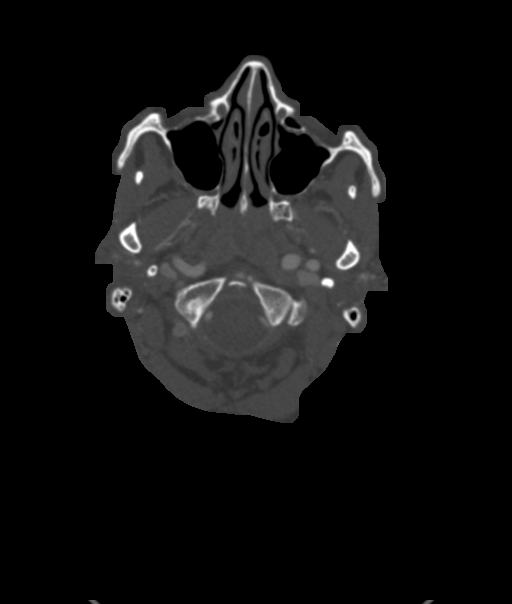
[im 245/343  soft-tissue]
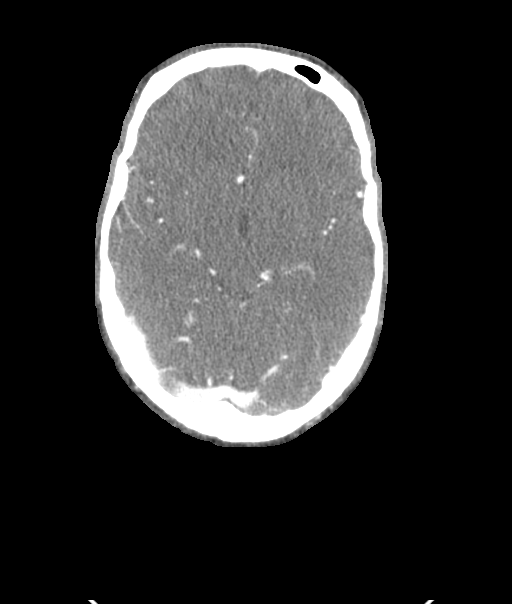
[im 294/343  bone]
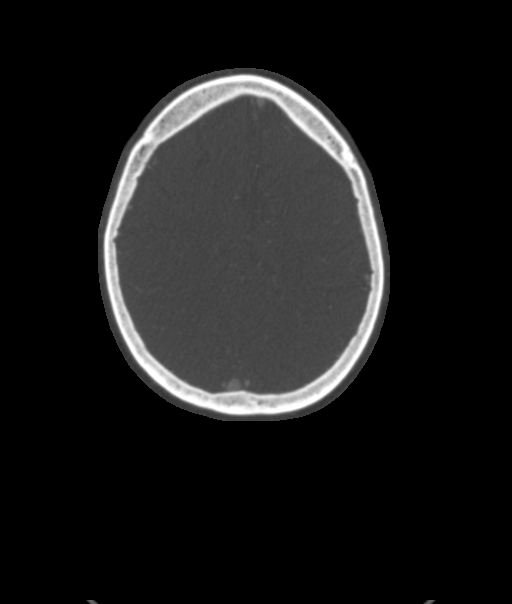

[6 of 33 positions shown; findings below may reference images not displayed]

RADIATION DOSE REDUCTION: This exam was performed according to the
departmental dose-optimization program which includes automated
exposure control, adjustment of the mA and/or kV according to
patient size and/or use of iterative reconstruction technique.

CONTRAST:  80mL OMNIPAQUE IOHEXOL 350 MG/ML SOLN
FINDINGS: CTA NECK FINDINGS

Aortic arch: Aortic atherosclerosis. No aneurysm or dissection.
Branching pattern is normal without origin stenosis.

Right carotid system: Common carotid artery widely patent to the
bifurcation. Minimal atherosclerotic plaque at the carotid
bifurcation but no stenosis. Cervical ICA widely patent.

Left carotid system: Common carotid artery widely patent to the
bifurcation. Calcified plaque at the carotid bifurcation and ICA
bulb but no stenosis. Cervical ICA is tortuous but widely patent.

Vertebral arteries: Both vertebral artery origins are widely patent.
There is calcified plaque adjacent to the right vertebral artery
origin but without stenosis greater than 30%.

Skeleton: Ordinary cervical spondylosis.  Mild scoliotic curvature.

Other neck: No mass or lymphadenopathy.

Upper chest: Lung apices are clear.

Review of the MIP images confirms the above findings

CTA HEAD FINDINGS

Anterior circulation: Both internal carotid arteries are patent
through the skull base and siphon regions. There is ordinary siphon
atherosclerotic calcification but no stenosis or aneurysm. Both
posterior cerebral arteries receive the majority of there supply
from the anterior circulation. Both middle cerebral arteries are
widely patent. There is a MCA trifurcation on the left with mild
ectasia of the MCA at the trifurcation but no frank aneurysm. No MCA
large vessel occlusion on either side. There is an azygous anterior
cerebral artery which is widely patent.

Posterior circulation: Both vertebral arteries are patent through
the foramen magnum. Both posteroinferior cerebellar arteries are
patent. Both vertebral arteries reach the basilar. The basilar is
small as there is fetal origin of both posterior cerebral arteries.
No choir posterior circulation disease is identified.

Venous sinuses: Patent and normal.

Anatomic variants: None other significant.

Review of the MIP images confirms the above findings
IMPRESSION: No acute intracranial large vessel occlusion.

Mild atherosclerotic change at the carotid bifurcation regions but
without stenosis or significant irregularity.

Ordinary siphon atherosclerotic disease but without stenosis greater
than 30%.

Several anatomic variants, including fetal origin of both posterior
cerebral arteries from the anterior circulation, as a gas anterior
cerebral artery, and trifurcation of the left middle cerebral artery
with mild ectasia at the trifurcation point but no frank aneurysm.

## 2021-07-05 IMAGING — CT CT HEAD CODE STROKE
3 series · 14 of 47 positions shown, 16 images · non-contrast
Comparison: [DATE]

CLINICAL DATA: Code stroke. Neuro deficit, acute, stroke suspected.
Confusion and speech disturbance.



[Series 3: head wo · axial · 0.50mm/px · z∈[+1282,+1438]mm · 8 of 37 slices shown, 10 images]
[im 3/37  brain]
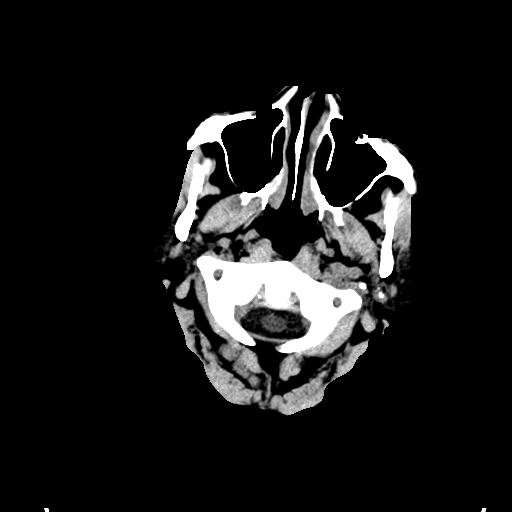
[im 3/37  bone]
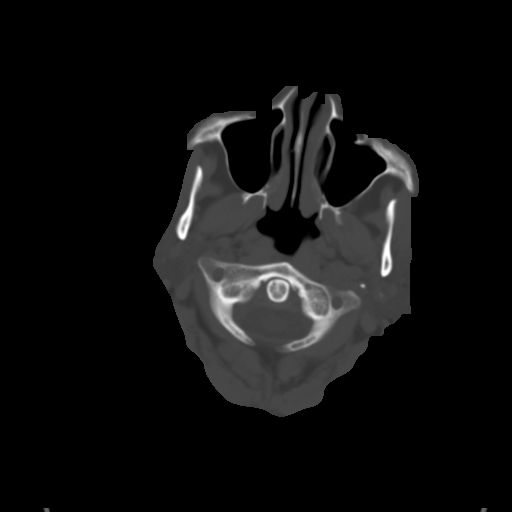
[im 8/37  brain]
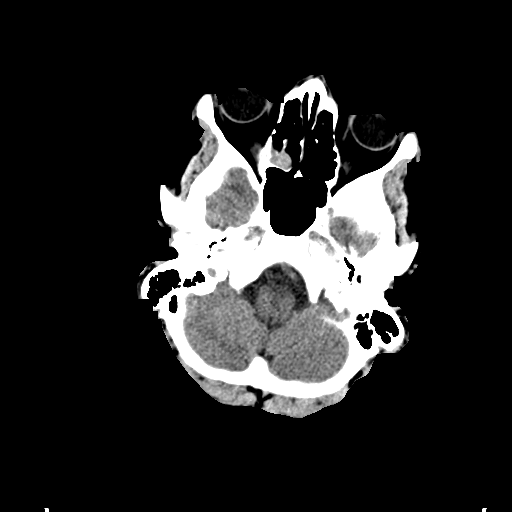
[im 12/37  brain]
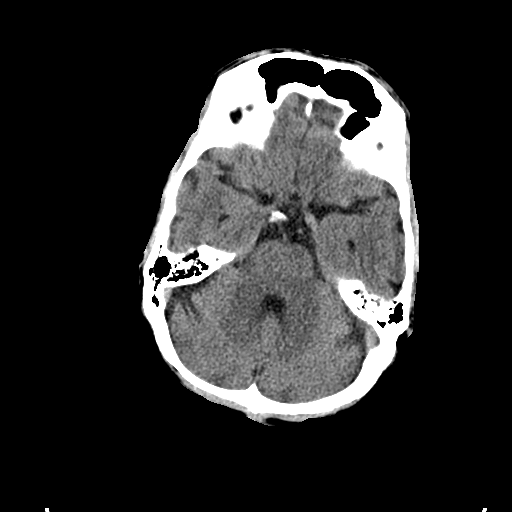
[im 17/37  brain]
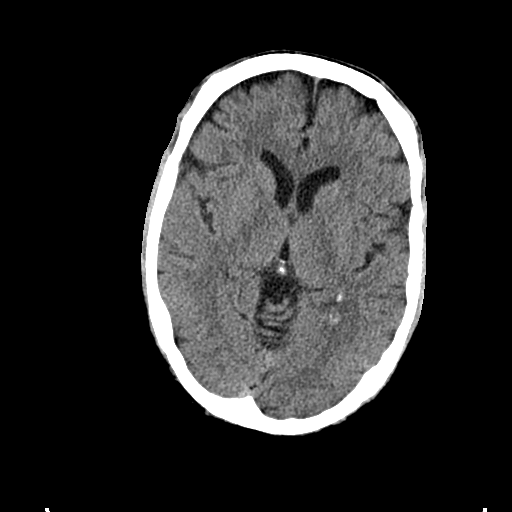
[im 20/37  brain]
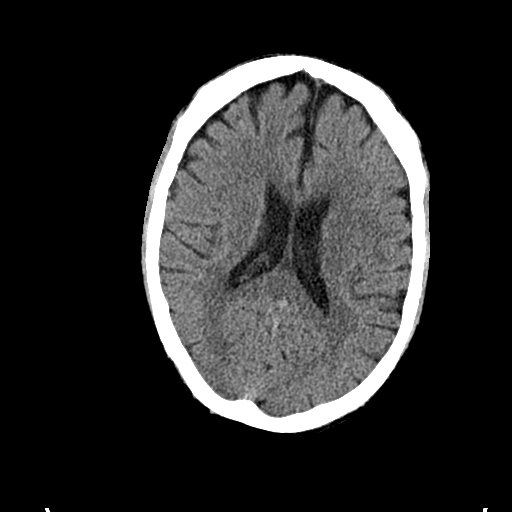
[im 20/37  bone]
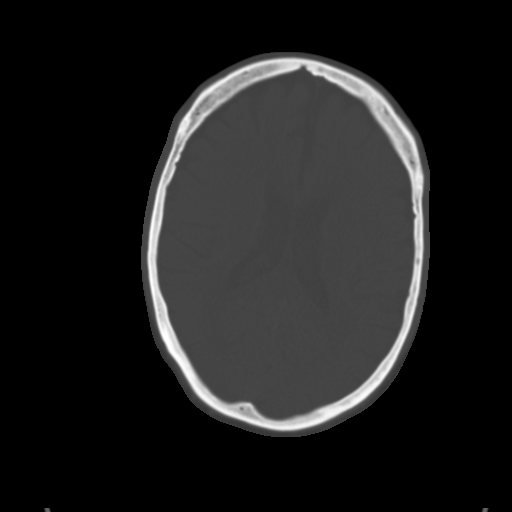
[im 25/37  brain]
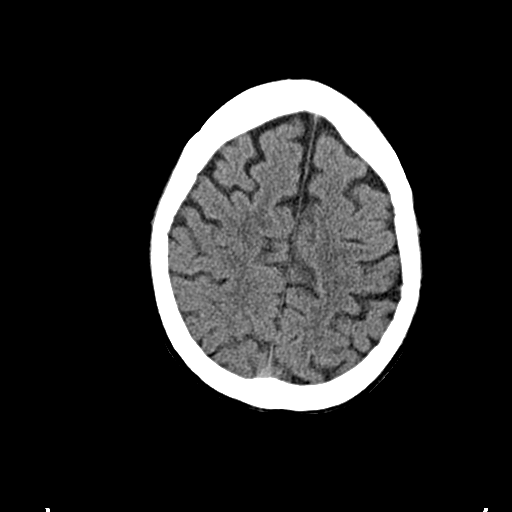
[im 29/37  brain]
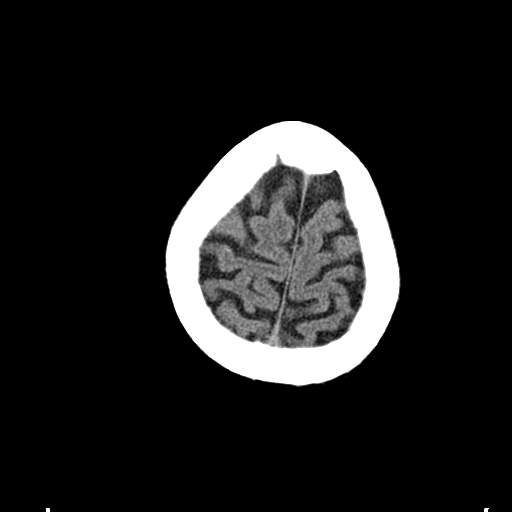
[im 34/37  brain]
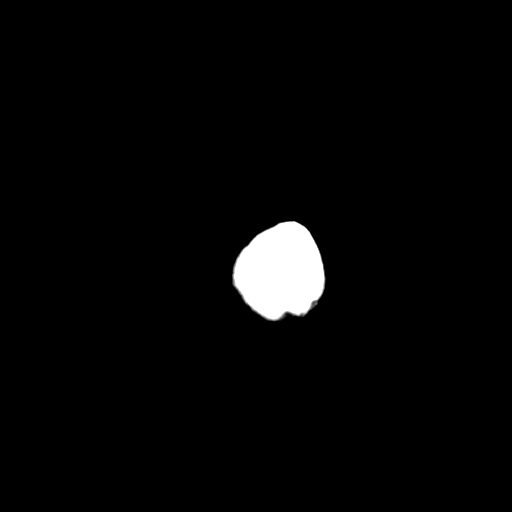

[Series 5: cor soft · coronal · 0.35mm/px · 3 of 83 slices shown]
[im 28/83  brain]
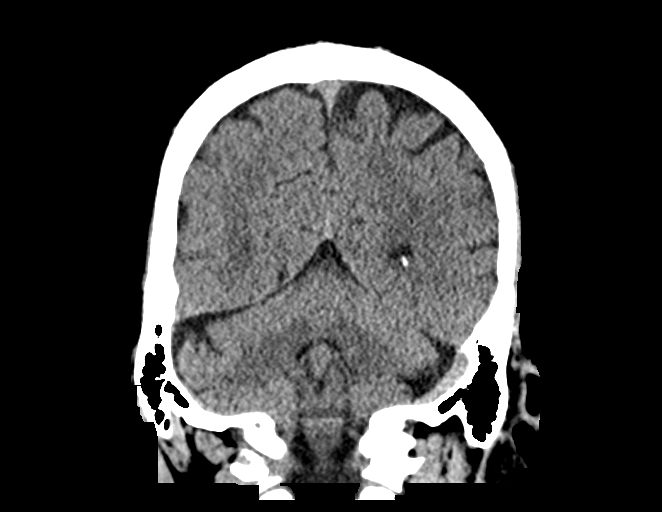
[im 37/83  brain]
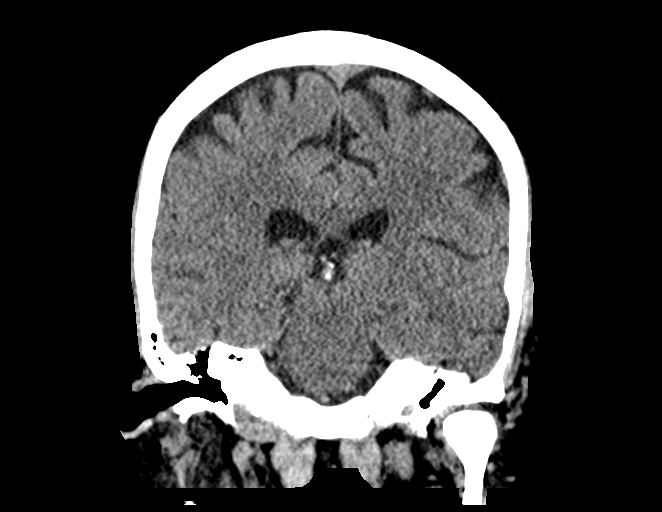
[im 46/83  brain]
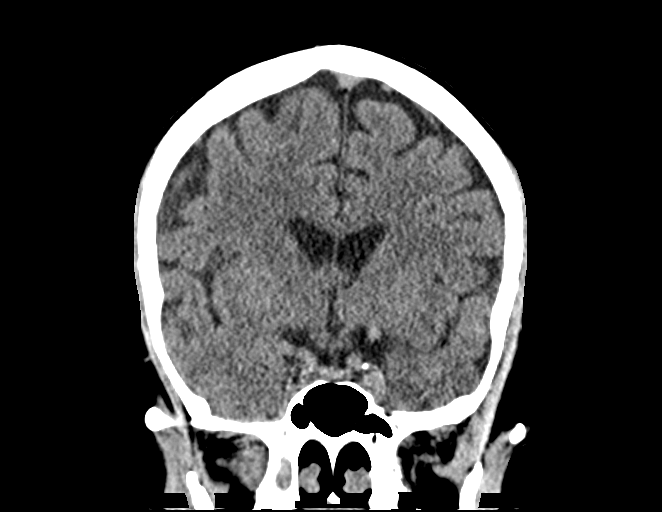

[Series 6: sag soft · sagittal · 0.35mm/px · 3 of 64 slices shown]
[im 22/64  brain]
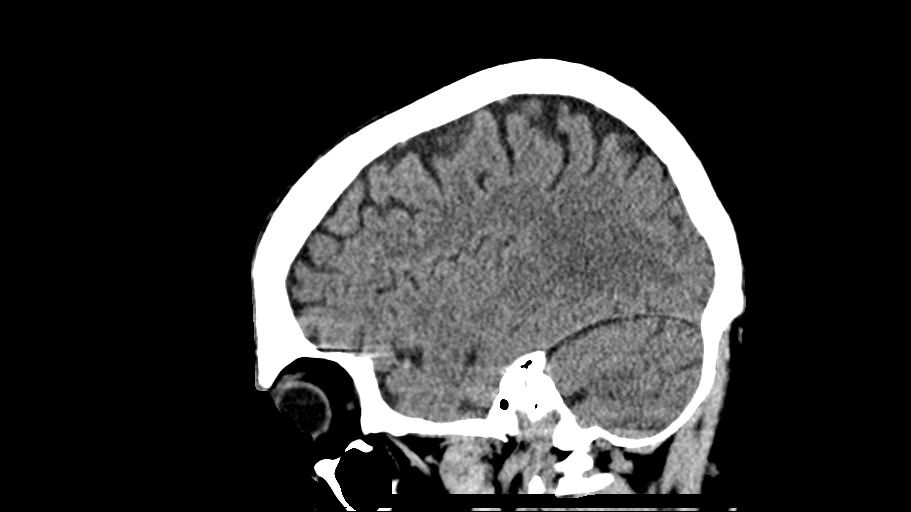
[im 32/64  brain]
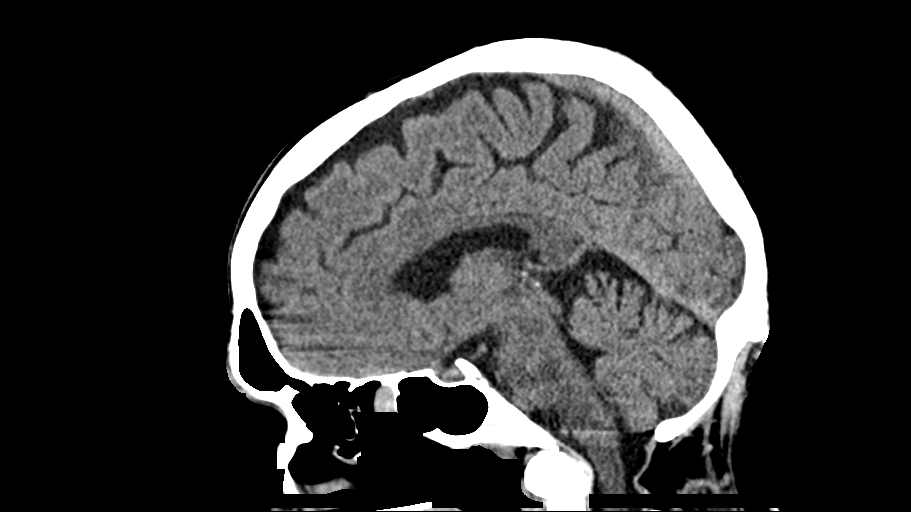
[im 43/64  brain]
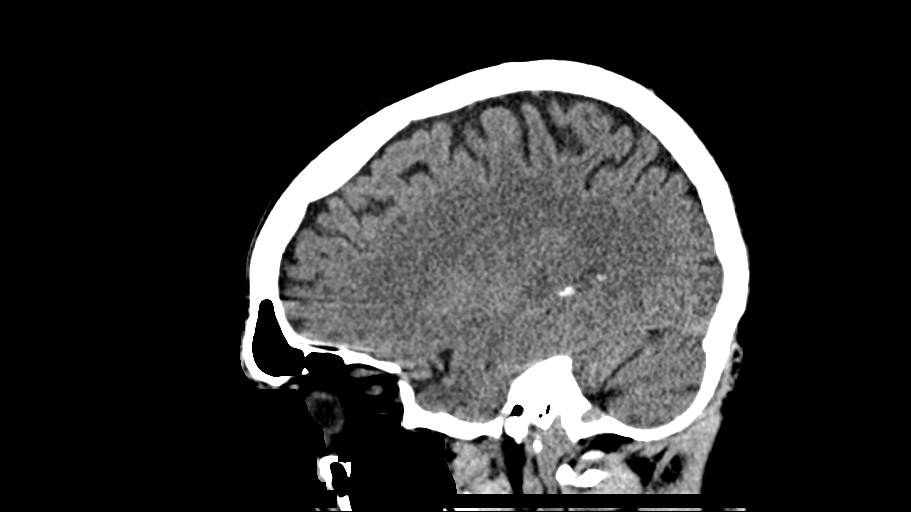

[14 of 47 positions shown; findings below may reference images not displayed]

FINDINGS: Brain: Normal appearance without evidence of old or acute
infarction, mass lesion, hemorrhage, hydrocephalus or extra-axial
collection.

Vascular: Mild tortuosity of the vessels at the base of the brain.
No acute vascular finding.

Skull: Normal

Sinuses/Orbits: Clear except for an opacified posterior ethmoid air
cell on the right. Orbits negative.

Other: None

ASPECTS (Alberta Stroke Program Early CT Score)

- Ganglionic level infarction (caudate, lentiform nuclei, internal
capsule, insula, M1-M3 cortex): 7

- Supraganglionic infarction (M4-M6 cortex): 3

Total score (0-10 with 10 being normal): 10
IMPRESSION: 1. Normal head CT.  No acute finding.
2. ASPECTS is 10
3. These results were called by telephone at the time of
interpretation on [DATE] at [DATE] to provider XILDHIBAN ,
who verbally acknowledged these results.

## 2021-07-05 MED ORDER — MIDAZOLAM HCL 2 MG/2ML IJ SOLN
INTRAMUSCULAR | Status: AC
  Administered 2021-07-05: 2 mg
  Filled 2021-07-05: qty 2

## 2021-07-05 MED ORDER — IOHEXOL 350 MG/ML SOLN
80.0000 mL | Freq: Once | INTRAVENOUS | Status: AC | PRN
  Administered 2021-07-05: 80 mL via INTRAVENOUS

## 2021-07-05 MED ORDER — TENECTEPLASE FOR STROKE: 0.2500 mg/kg | PACK | Freq: Once | INTRAVENOUS | Status: AC

## 2021-07-05 MED ORDER — ONDANSETRON HCL 4 MG/2ML IJ SOLN
4.0000 mg | Freq: Once | INTRAMUSCULAR | Status: AC
  Administered 2021-07-05: 4 mg via INTRAVENOUS

## 2021-07-05 MED ORDER — ONDANSETRON HCL 4 MG/2ML IJ SOLN
4.0000 mg | Freq: Once | INTRAMUSCULAR | Status: DC
Start: 2021-07-05 — End: 2021-07-07

## 2021-07-05 MED ORDER — TENECTEPLASE FOR STROKE
PACK | INTRAVENOUS | Status: AC
  Administered 2021-07-05: 18 mg via INTRAVENOUS
  Filled 2021-07-05: qty 10

## 2021-07-05 MED ORDER — ONDANSETRON HCL 4 MG/2ML IJ SOLN
INTRAMUSCULAR | Status: AC
Start: 2021-07-05 — End: 2021-07-06
  Filled 2021-07-05: qty 2

## 2021-07-05 MED ORDER — CLEVIDIPINE BUTYRATE 0.5 MG/ML IV EMUL: 0.0000 mg/h | INTRAVENOUS | Status: DC

## 2021-07-05 NOTE — ED Notes (Signed)
CODE STROKE CART IN ROOM NEUROLOGIST ON CART AND BELFI ED MD AT BEDSIDE ?

## 2021-07-05 NOTE — ED Notes (Signed)
Redraw red top blood tube and sent to lab ?

## 2021-07-05 NOTE — ED Notes (Signed)
Patient transported to CT 

## 2021-07-05 NOTE — ED Notes (Signed)
Pt has returned from CT.  

## 2021-07-05 NOTE — Consult Note (Signed)
[facility] ?TeleNeurology Consult Services  ?  ?Date of Service:  07/05/2021  ?  ?  ?Metrics: ?Last Known Well: 3:00 PM ?Patient is a candidate for thrombolytic.  ? Location of the provider: [facility] ? Location of the patient: [place] ?Pre-Morbid Modified Rankin Scale: 0 ?Time Code Stroke Page received:  5:50 PM ?Time neurologist arrived:  5:53 PM ?Time NIHSS completed: 6:12 PM  ? ? ?This consult was provided via telemedicine with 2-way video and audio communication. The patient/family was informed that care would be provided in this way and agreed to receive care in this manner. ?  ?  ?Assessment: 66 year old male presenting with acute onset of receptive and expressive aphasia.   ?- Exam reveals expressive and receptive aphasia without motor, visual or sensory deficit. NIHSS of 7. ?- CT head: Normal head CT.  No acute finding. ASPECTS is 10 ?- Most likely etiology for the patient's presentation is an acute stroke. Deficits are localizable to the left perisylvian cortices.  ?- After comprehensive review of possible contraindications, he has no absolute contraindications to TNK administration. ?- Patient is an IV thrombolysis candidate. Discussed extensively the risks/benefits of IV thrombolysis treatment vs. no treatment with the patient's wife, including risks of hemorrhage and death with IV thrombolysis administration versus worse overall outcomes on average in patients within thrombolysis time window who are not administered TNK. Increased risk of GIB with colonic AVM also discussed, with overall benefits of TNK outweighing risks. The patient's aphasia precludes meaningful medical decision making on his part at this time. Overall benefits of TNK regarding long-term prognosis are felt to outweigh risks. The patient's wife expressed understanding and wish to proceed with TNK.  ?- CTA shows no LVO.  ?  ?Recommendations: ?- STAT transfer to [facility]. To be admitted to the  ICU under the Neurology service.  ?- Dr. Rory Percy and Dr. Theda Sers have been notified.  ?- Discussed with Dr. Tamera Punt ?- Post-TNK order set to include frequent neuro checks and BP management.  ?- No antiplatelet medications or anticoagulants for at least 24 hours following tPA.  ?- DVT prophylaxis with SCDs.  ?- Will need to be started on a statin.  ?- Will need to start antiplatelet therapy if follow up CT at 24 hours is negative for hemorrhagic conversion. ?- TTE.  ?- MRI brain  ?- PT/OT/Speech.  ?- NPO until passes swallow evaluation.  ?- Telemetry monitoring ?- Fasting lipid panel, HgbA1c ?   ?------------------------------------------------------------------------------ ?  ?History of Present Illness: The patient is a 66 year old male with a PMHx of colonic AVM, bipolar I disorder, HTN, migraines, OSA, one episode of speech deficit with seizure-like semioloogy diagnosed as possibly being secondary to partial complex seizure (episode occurred in January of 2022), panic attacks and prior substance abuse who presents via POV with wife for evaluation of acute onset of garbled speech. LKN was 3:00 PM, at which time the first symptoms were noticed by his wife, consisting of nonsensical speech output described as "gibberish" as well as difficulty with speech comprehension. Wife then drove him to [facility] for evaluation, where a Code Stroke was called.  ? ?Wife states that he did not seem to be having a headache. He did vomit once after arrival and then again after he was moved to an ED room. BP on arrival was 180/98 with glucose of 114. He did experience bradycardia with a rate in the 20's after the first vomiting spell.  ? ?Not on scheduled  ASA, Plavix or a blood thinner.  ?  ?Of note, the seizure-like spell last year consisted of a fall and possible seizure activity. The episodes he was describing sounded concerning for partial complex seizures and he was continued on Lamictal, which had been previously prescribed for his  bipolar disorder. Seen as an outpatient by [facility].  ?  ?Past Medical History: ?Past Medical History:  ?Diagnosis Date  ? Anxiety   ? on meds  ? AVM (arteriovenous malformation) of colon 02/04/2021  ? Bipolar I disorder, most recent episode (or current) mixed, moderate   ? GERD (gastroesophageal reflux disease)   ? Hypertension   ? on meds  ? Migraines   ? OSA (obstructive sleep apnea)   ? retested-bipap not needed  ? Panic attacks   ? Seizures ([place])   ? last known 05/2020;  ? Substance abuse ([place])   ? - Restless legs syndrome ? ?  ?Past Surgical History: ?Past Surgical History:  ?Procedure Laterality Date  ? CARPOMETACARPEL ([place]) FUSION OF THUMB WITH AUTOGRAFT FROM RADIUS Bilateral   ? COLONOSCOPY  2011  ? CG-in pt at [place] Left 06/17/2012  ? Procedure: FINGER ARTHROPLASTY;  Surgeon: Jolyn Nap, MD;  Location: [facility];  Service: Orthopedics;  Laterality: Left;  left thumb suspension arthroplasty   ? [place]  ? Dr Rosana Hoes  ? TONSILLECTOMY    ? WISDOM TOOTH EXTRACTION    ? ? ? ?  ?Medications:  ?No current facility-administered medications on file prior to encounter.  ? ?Current Outpatient Medications on File Prior to Encounter  ?Medication Sig Dispense Refill  ? amLODipine-benazepril (LOTREL) 5-20 MG per capsule TAKE 1 CAPSULE BY MOUTH DAILY 90 capsule 1  ? carvedilol (COREG) 25 MG tablet TAKE 1 TABLET BY MOUTH TWICE DAILY WITH A MEAL 180 tablet 1  ? lithium 300 MG capsule Take 300 mg by mouth as directed. 1 in am and 1 - 2 at bedtime    ? clonazePAM (KLONOPIN) 0.5 MG tablet Take 0.5-1 mg by mouth daily as needed.    ? lamoTRIgine (LAMICTAL) 150 MG tablet Take 1 tablet (150 mg total) by mouth 2 (two) times daily. 60 tablet 5  ? naratriptan (AMERGE) 2.5 MG tablet Take 2.5 mg by mouth as needed. .    ? QUEtiapine (SEROQUEL) 400 MG tablet Take 400 mg by mouth at bedtime.    ? rOPINIRole (REQUIP) 1 MG tablet Take 1 mg by mouth at bedtime.    ? topiramate (TOPAMAX)  50 MG tablet Take 1 tablet (50 mg total) by mouth daily. 30 tablet 5  ? ? ?  ?   ?Social History: ?Social History  ? ?Tobacco Use  ? Smoking status: Former  ?  Types: Pipe, Cigars  ? Smokeless tobacco: Never  ?Vaping Use  ? Vaping Use: Never used  ?Substance Use Topics  ? Alcohol use: No  ? Drug use: No  ?  Types: Cocaine  ?  Comment: past history of use-  ? ? ?  ?Family History:  ?Reviewed in Epic ?  ?ROS: As per HPI  ?  ?Antiplatelet use: ASA given by wife after onset of symptoms.  ?  ?Examination: ?  ? BP (!) 157/90   Pulse 70   Temp 98.1 ?F (36.7 ?C) (Oral)   Resp 13   Ht 5\' 10"  (1.778 m)   Wt 71.9 kg   SpO2 97%   BMI 22.74 kg/m?  ? ?  ?  1A: Level of Consciousness - 1 ?1B: Ask Month and Age - 2 ?1C: Blink Eyes & Squeeze Hands - 2 ?2: Test Horizontal Extraocular Movements - 0 ?3: Test Visual Fields - 0 ?4: Test Facial Palsy (Use Grimace if Obtunded) - 0 ?5A: Test Left Arm Motor Drift - 0 ?5B: Test Right Arm Motor Drift - 0 ?6A: Test Left Leg Motor Drift - 0 ?6B: Test Right Leg Motor Drift - 0 ?7: Test Limb Ataxia (FNF/Heel-Shin) - 0 ?8: Test Sensation -  0 ?9: Test Language/Aphasia - 2 ?10: Test Dysarthria - Severe Dysarthria: 0 ?11: Test Extinction/Inattention - Extinction to bilateral simultaneous stimulation 0 ?  ?NIHSS Score: 7 ?  ?  ?Patient/Family was informed the Neurology Consult would occur via TeleHealth consult by way of interactive audio and video telecommunications and consented to receiving care in this manner. ?  ?Patient is being evaluated for possible acute neurologic impairment and high pretest probability of imminent or life-threatening deterioration. I spent total of 73 minutes providing care to this patient, including time for face to face visit via telemedicine, review of medical records, imaging studies and discussion of findings with providers, the patient and/or family. ?  ?Electronically signed: Dr. Kerney Elbe ? ?

## 2021-07-05 NOTE — ED Triage Notes (Signed)
Driving back from [place] after having an appointment with his MD. Pt has a hx of bipolar. 3pm after getting gas for the car his speech did not make sense and he was confused. Pt is ambulatory. Confused.   ?

## 2021-07-05 NOTE — ED Provider Notes (Addendum)
[facility] EMERGENCY DEPARTMENT Provider Note   CSN: [id number] Arrival date & time: 07/05/21  1719     History  No chief complaint on file.   Matthew [name] is a 66 y.o. male.  Patient is a 66 year old male who presents with an episode of confusion.  He was brought in by his wife.  He has a history of bipolar disorder, anxiety, hypertension, prior substance abuse but none recent per the wife and questionable history of seizures.  There was a incident that he had about a year ago where he had had a fall and possible seizure activity.  The episodes he was describing sounded concerning for partial complex seizures and he is continued on Lamictal for this.  He is also on Lamictal for his bipolar disorder.  He has had no prior diagnosed seizures.  Per his wife, they were driving back from [place] where he was visiting his psychiatrist that he sees.  He was at a gas station feeling of gas and when he had a sudden onset of nonsensical type speech.  He was able to walk normally.  He was not reporting a headache or numbness.  He just was not making sense with his speech per his wife.  That is continued.  This episode happened at 3 PM and his symptoms have continued.  Per the wife, he had no medication changes or was not given any medications at the psychiatrist office.      Home Medications Prior to Admission medications   Medication Sig Start Date End Date Taking? Authorizing Provider  amLODipine-benazepril (LOTREL) 5-20 MG per capsule TAKE 1 CAPSULE BY MOUTH DAILY 10/04/11  Yes Plotnikov, Georgina Quint, MD  carvedilol (COREG) 25 MG tablet TAKE 1 TABLET BY MOUTH TWICE DAILY WITH A MEAL 10/04/11  Yes Plotnikov, Georgina Quint, MD  lithium 300 MG capsule Take 300 mg by mouth as directed. 1 in am and 1 - 2 at bedtime   Yes [provider]  clonazePAM (KLONOPIN) 0.5 MG tablet Take 0.5-1 mg by mouth daily as needed. 12/09/20   [provider]  lamoTRIgine (LAMICTAL) 150 MG tablet  Take 1 tablet (150 mg total) by mouth 2 (two) times daily. 02/28/21   Ihor Austin, NP  naratriptan (AMERGE) 2.5 MG tablet Take 2.5 mg by mouth as needed. .    [provider]  QUEtiapine (SEROQUEL) 400 MG tablet Take 400 mg by mouth at bedtime. 11/06/20   [provider]  rOPINIRole (REQUIP) 1 MG tablet Take 1 mg by mouth at bedtime. 07/01/18   [provider]  topiramate (TOPAMAX) 50 MG tablet Take 1 tablet (50 mg total) by mouth daily. 02/10/21   Ihor Austin, NP      Allergies    Sumatriptan and Tramadol    Review of Systems   Review of Systems  Unable to perform ROS: Mental status change   Physical Exam Updated Vital Signs BP (!) 173/90    Pulse 69    Temp 98.1 F (36.7 C) (Oral)    Resp 19    Ht 5\' 10"  (1.778 m)    Wt 71.9 kg    SpO2 100%    BMI 22.74 kg/m  Physical Exam Constitutional:      Appearance: He is well-developed.  HENT:     Head: Normocephalic and atraumatic.  Eyes:     Pupils: Pupils are equal, round, and reactive to light.  Cardiovascular:     Rate and Rhythm: Normal rate and regular  rhythm.     Heart sounds: Normal heart sounds.  Pulmonary:     Effort: Pulmonary effort is normal. No respiratory distress.     Breath sounds: Normal breath sounds. No wheezing or rales.  Chest:     Chest wall: No tenderness.  Abdominal:     General: Bowel sounds are normal.     Palpations: Abdomen is soft.     Tenderness: There is no abdominal tenderness. There is no guarding or rebound.  Musculoskeletal:        General: Normal range of motion.     Cervical back: Normal range of motion and neck supple.  Lymphadenopathy:     Cervical: No cervical adenopathy.  Skin:    General: Skin is warm and dry.     Findings: No rash.  Neurological:     Mental Status: He is alert.     Comments: Patient is awake and alert.  He answers every question with either "I do not know" or "no".  I cannot get him to name objects.  When I ask him to name objects or ask  him orientation questions, he says "I don't know".  He is unable to state his wife's name.  At times when I ask him to name something he has nonsensical speech.  He does not have any slurred speech.  He will hold his arms up and has normal appearing strength in both of his arms.  He would not hold his legs up for me but is moving his legs symmetrically.  He seems to have normal sensation but it is difficult to assess given the has not following commands well.  No obvious facial drooping.    ED Results / Procedures / Treatments   Labs (all labs ordered are listed, but only abnormal results are displayed) Labs Reviewed  COMPREHENSIVE METABOLIC PANEL - Abnormal; Notable for the following components:      Result Value   Glucose, Bld 114 (*)    All other components within normal limits  RESP PANEL BY RT-PCR (FLU A&B, COVID) ARPGX2  CBC WITH DIFFERENTIAL/PLATELET  ETHANOL  PROTIME-INR  APTT  RAPID URINE DRUG SCREEN, HOSP PERFORMED  URINALYSIS, ROUTINE W REFLEX MICROSCOPIC  CBG MONITORING, ED    EKG EKG Interpretation  Date/Time:  Tuesday July 05 2021 17:34:30 EST Ventricular Rate:  34 PR Interval:  201 QRS Duration: 109 QT Interval:  464 QTC Calculation: 349 R Axis:   18 Text Interpretation: Junctional rhythm Abnormal R-wave progression, early transition Confirmed by Rolan Bucco [phone]) on 07/05/2021 5:54:00 PM  Radiology CT HEAD CODE STROKE WO CONTRAST  Result Date: 07/05/2021 CLINICAL DATA:  Code stroke. Neuro deficit, acute, stroke suspected. Confusion and speech disturbance. EXAM: CT HEAD WITHOUT CONTRAST TECHNIQUE: Contiguous axial images were obtained from the base of the skull through the vertex without intravenous contrast. RADIATION DOSE REDUCTION: This exam was performed according to the departmental dose-optimization program which includes automated exposure control, adjustment of the mA and/or kV according to patient size and/or use of iterative reconstruction technique.  COMPARISON:  05/04/2020 FINDINGS: Brain: Normal appearance without evidence of old or acute infarction, mass lesion, hemorrhage, hydrocephalus or extra-axial collection. Vascular: Mild tortuosity of the vessels at the base of the brain. No acute vascular finding. Skull: Normal Sinuses/Orbits: Clear except for an opacified posterior ethmoid air cell on the right. Orbits negative. Other: None ASPECTS (Alberta Stroke Program Early CT Score) - Ganglionic level infarction (caudate, lentiform nuclei, internal capsule, insula, M1-M3 cortex): 7 - Supraganglionic infarction (  M4-M6 cortex): 3 Total score (0-10 with 10 being normal): 10 IMPRESSION: 1. Normal head CT.  No acute finding. 2. ASPECTS is 10 3. These results were called by telephone at the time of interpretation on 07/05/2021 at 6:02 pm to provider Eleazar Kimmey , who verbally acknowledged these results. Electronically Signed   By: Paulina Fusi M.D.   On: 07/05/2021 18:03   CT ANGIO HEAD NECK W WO CM (CODE STROKE)  Result Date: 07/05/2021 CLINICAL DATA:  Neuro deficit, acute, stroke suspected. Confusion. Word finding difficulty. EXAM: CT ANGIOGRAPHY HEAD AND NECK TECHNIQUE: Multidetector CT imaging of the head and neck was performed using the standard protocol during bolus administration of intravenous contrast. Multiplanar CT image reconstructions and MIPs were obtained to evaluate the vascular anatomy. Carotid stenosis measurements (when applicable) are obtained utilizing NASCET criteria, using the distal internal carotid diameter as the denominator. RADIATION DOSE REDUCTION: This exam was performed according to the departmental dose-optimization program which includes automated exposure control, adjustment of the mA and/or kV according to patient size and/or use of iterative reconstruction technique. CONTRAST:  35mL OMNIPAQUE IOHEXOL 350 MG/ML SOLN COMPARISON:  Head CT earlier same day.  Brain MRI 05/05/2020. FINDINGS: CTA NECK FINDINGS Aortic arch: Aortic  atherosclerosis. No aneurysm or dissection. Branching pattern is normal without origin stenosis. Right carotid system: Common carotid artery widely patent to the bifurcation. Minimal atherosclerotic plaque at the carotid bifurcation but no stenosis. Cervical ICA widely patent. Left carotid system: Common carotid artery widely patent to the bifurcation. Calcified plaque at the carotid bifurcation and ICA bulb but no stenosis. Cervical ICA is tortuous but widely patent. Vertebral arteries: Both vertebral artery origins are widely patent. There is calcified plaque adjacent to the right vertebral artery origin but without stenosis greater than 30%. Skeleton: Ordinary cervical spondylosis.  Mild scoliotic curvature. Other neck: No mass or lymphadenopathy. Upper chest: Lung apices are clear. Review of the MIP images confirms the above findings CTA HEAD FINDINGS Anterior circulation: Both internal carotid arteries are patent through the skull base and siphon regions. There is ordinary siphon atherosclerotic calcification but no stenosis or aneurysm. Both posterior cerebral arteries receive the majority of there supply from the anterior circulation. Both middle cerebral arteries are widely patent. There is a MCA trifurcation on the left with mild ectasia of the MCA at the trifurcation but no frank aneurysm. No MCA large vessel occlusion on either side. There is an azygous anterior cerebral artery which is widely patent. Posterior circulation: Both vertebral arteries are patent through the foramen magnum. Both posteroinferior cerebellar arteries are patent. Both vertebral arteries reach the basilar. The basilar is small as there is fetal origin of both posterior cerebral arteries. No choir posterior circulation disease is identified. Venous sinuses: Patent and normal. Anatomic variants: None other significant. Review of the MIP images confirms the above findings IMPRESSION: No acute intracranial large vessel occlusion. Mild  atherosclerotic change at the carotid bifurcation regions but without stenosis or significant irregularity. Ordinary siphon atherosclerotic disease but without stenosis greater than 30%. Several anatomic variants, including fetal origin of both posterior cerebral arteries from the anterior circulation, as a gas anterior cerebral artery, and trifurcation of the left middle cerebral artery with mild ectasia at the trifurcation point but no frank aneurysm. Electronically Signed   By: Paulina Fusi M.D.   On: 07/05/2021 18:54    Procedures Procedures    Medications Ordered in ED Medications  ondansetron (ZOFRAN) injection 4 mg (has no administration in time range)  ondansetron (  ZOFRAN) 4 MG/2ML injection (has no administration in time range)  clevidipine (CLEVIPREX) infusion 0.5 mg/mL (has no administration in time range)  ondansetron (ZOFRAN) injection 4 mg (4 mg Intravenous Given 07/05/21 1800)  tenecteplase (TNKASE) injection for Stroke 18 mg (18 mg Intravenous Given by Other 07/05/21 1823)  iohexol (OMNIPAQUE) 350 MG/ML injection 80 mL (80 mLs Intravenous Contrast Given 07/05/21 1828)  midazolam (VERSED) 2 MG/2ML injection (2 mg  Given by Other 07/05/21 1847)    ED Course/ Medical Decision Making/ A&P                           Medical Decision Making Amount and/or Complexity of Data Reviewed Labs: ordered. Radiology: ordered.  Risk Prescription drug management. Decision regarding hospitalization.   Patient is a 66 year old male who arrives by private vehicle with a sudden onset of confusion/difficulty with his speech.  His wife reports that is nonsensical.  This started at 3 PM today.  He does not seem to have any focal deficits on exam but it is difficult to evaluate.  Initially I did not hear any nonsensical type speech but every time I asked him a question he would answer with either "I do not know" or "no".  During this time, he had an episode where his heart rate dropped down into the 20s  and he had an episode of emesis.  This lasted less than a minute and went back up into the 60s.  His speech was reassessed and at this point he did have some incidence where he would have some nonsensical type speech.  Given this, code stroke was activated.  He was evaluated by Dr. Otelia LimesLindzen.  His head CT was negative for intracranial hemorrhage or obvious other abnormality.  The decision was made to give him TNKase.  His wife consented with Dr. Otelia LimesLindzen.  He was taken back to CT and had a CTA which did not show any evidence of a LVO.  He one-point became very agitated and was trying to get out of the bed and would not stay still.  He was given 2 mg of midazolam.  His labs are reviewed and are nonconcerning.  [facility] neurology team [facility].  Will need transfer by [facility].  CRITICAL CARE Performed by: Rolan BuccoMelanie Ebb [facility] Total critical care time: 80 minutes Critical care time was exclusive of separately billable procedures and treating other patients. Critical care was necessary to treat or prevent imminent or life-threatening deterioration. Critical care was time spent personally by me on the following activities: development of treatment plan with patient and/or surrogate as well as nursing, discussions with consultants, evaluation of patient's response to treatment, examination of patient, obtaining history from patient or surrogate, ordering and performing treatments and interventions, ordering and review of laboratory studies, ordering and review of radiographic studies, pulse oximetry and re-evaluation of patient's condition.   Final Clinical Impression(s) / ED Diagnoses Final diagnoses:  Cerebrovascular accident (CVA), unspecified mechanism (HCC)  Bradycardia    Rx / DC Orders ED Discharge Orders     None         Rolan BuccoBelfi, Makari Portman, MD 07/05/21 1904    Rolan BuccoBelfi, Oshay Stranahan, MD 07/05/21 [phone]

## 2021-07-05 NOTE — ED Notes (Signed)
[facility] GIVEN REPORT ?

## 2021-07-05 NOTE — ED Notes (Signed)
Pt has nonsustained episode of bradycardia, HR 25, associated with emesis. Dr. Fredderick Phenix at bedside. Bradycardia lasted about 45 seconds. New EKG obtained. HR spontaneously went back up to 69.  ?

## 2021-07-05 NOTE — ED Notes (Signed)
VS, NIH and MAR updated per protocol per Code stroke RN. Family updated on POC. Neuro signed off. [facility] ETA 15 mins ?

## 2021-07-05 NOTE — ED Notes (Signed)
Dr. Fredderick Phenix called to bedside. ?

## 2021-07-05 NOTE — ED Notes (Signed)
Unable to perform swallow study at this time due to Pt being non compliant to commands stating "give me my shirt. Im leaving". ?

## 2021-07-06 ENCOUNTER — Inpatient Hospital Stay (HOSPITAL_COMMUNITY): Payer: Medicare Other

## 2021-07-06 DIAGNOSIS — I6389 Other cerebral infarction: Secondary | ICD-10-CM

## 2021-07-06 DIAGNOSIS — R569 Unspecified convulsions: Secondary | ICD-10-CM

## 2021-07-06 DIAGNOSIS — R4701 Aphasia: Secondary | ICD-10-CM

## 2021-07-06 DIAGNOSIS — Z9282 Status post administration of tPA (rtPA) in a different facility within the last 24 hours prior to admission to current facility: Secondary | ICD-10-CM

## 2021-07-06 DIAGNOSIS — Z5181 Encounter for therapeutic drug level monitoring: Secondary | ICD-10-CM

## 2021-07-06 DIAGNOSIS — Z7902 Long term (current) use of antithrombotics/antiplatelets: Secondary | ICD-10-CM

## 2021-07-06 DIAGNOSIS — R29707 NIHSS score 7: Secondary | ICD-10-CM

## 2021-07-06 DIAGNOSIS — I63419 Cerebral infarction due to embolism of unspecified middle cerebral artery: Secondary | ICD-10-CM

## 2021-07-06 LAB — ECHOCARDIOGRAM COMPLETE
AR max vel: 4.15 cm2
AV Area VTI: 4.35 cm2
AV Area mean vel: 4.02 cm2
AV Mean grad: 5 mmHg
AV Peak grad: 9.4 mmHg
Ao pk vel: 1.53 m/s
Area-P 1/2: 3.48 cm2
Height: 70 in
MV VTI: 3.79 cm2
S' Lateral: 2.7 cm
Weight: 2536.17 oz

## 2021-07-06 LAB — MRSA NEXT GEN BY PCR, NASAL: MRSA by PCR Next Gen: NOT DETECTED

## 2021-07-06 IMAGING — MR MR HEAD W/O CM
12 of 13 series · 44 of 48 positions shown · non-contrast
Comparison: Head CT and CTA [DATE] and MRI [DATE]

CLINICAL DATA: Neuro deficit, acute, stroke suspected. Acute speech
disturbance. Status MRMR.

EXAM:
MRI HEAD WITHOUT CONTRAST
TECHNIQUE: Multiplanar, multiecho pulse sequences of the brain and surrounding
structures were obtained without intravenous contrast.

[Series 5: DWI · axial · 3.0mm · 0.88mm/px · z∈[-72,+80]mm · 8 of 104 slices shown (1 of 4)]
[im 1/104]
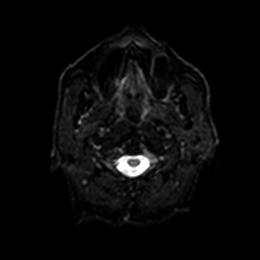
[im 15/104]
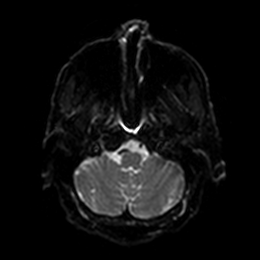
[im 30/104]
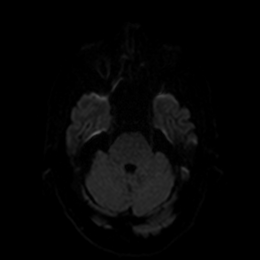
[im 45/104]
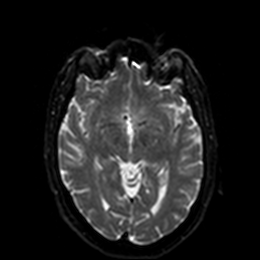
[im 59/104]
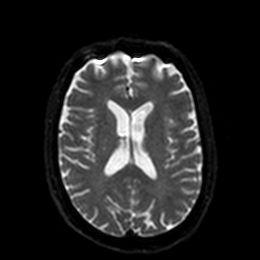
[im 74/104]
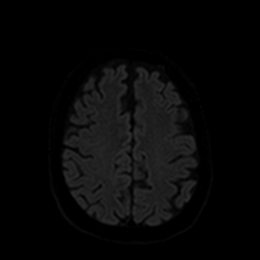
[im 89/104]
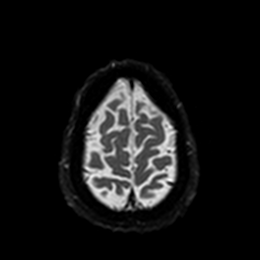
[im 104/104]
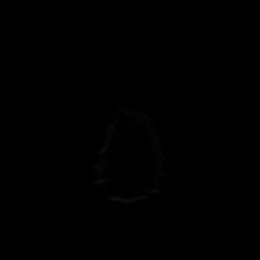

[Series 6: DWI · axial · 3.0mm · 0.88mm/px · z∈[-72,+80]mm · 4 of 51 slices shown (2 of 4)]
[im 1/51]
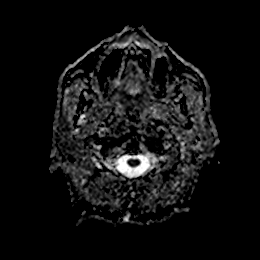
[im 17/51]
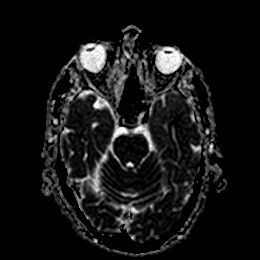
[im 34/51]
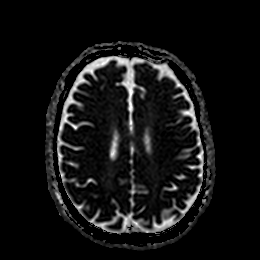
[im 51/51]
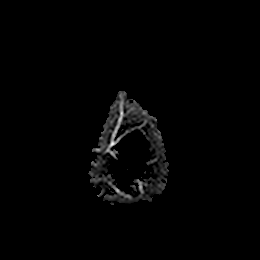

[Series 7: DWI · coronal · 4.0mm · 0.88mm/px · 6 of 72 slices shown (3 of 4)]
[im 1/72]
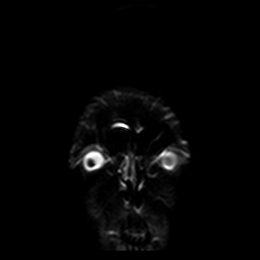
[im 15/72]
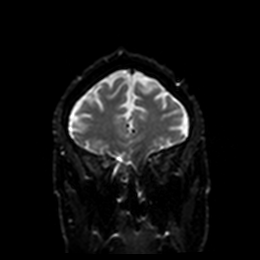
[im 29/72]
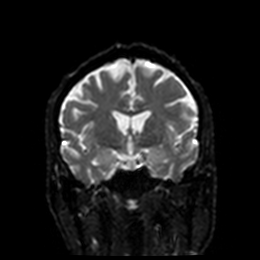
[im 43/72]
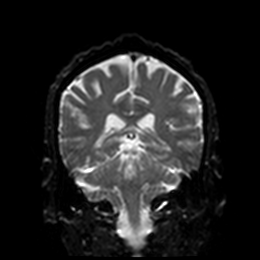
[im 57/72]
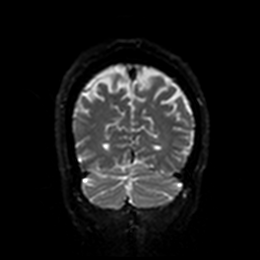
[im 72/72]
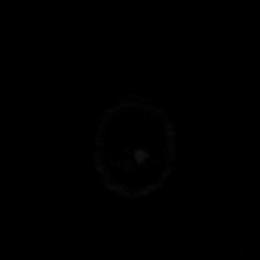

[Series 8: DWI · coronal · 4.0mm · 0.88mm/px · 3 of 36 slices shown (4 of 4)]
[im 1/36]
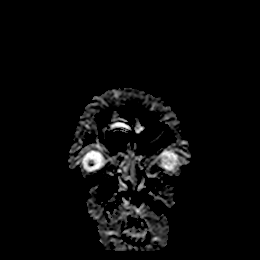
[im 18/36]
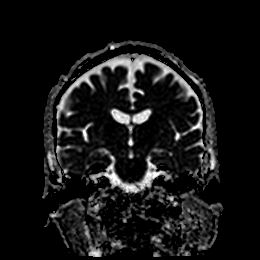
[im 36/36]
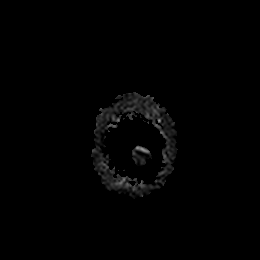

[Series 9: T1 · sagittal · 5.0mm · 0.75mm/px · 2 of 23 slices shown]
[im 1/23]
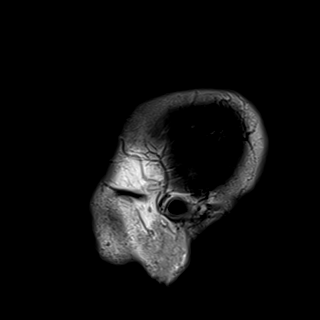
[im 23/23]
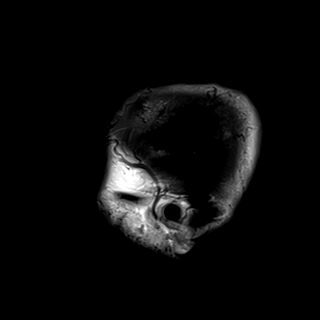

[Series 10: T2 · axial · 5.0mm · 0.72mm/px · z∈[-69,+87]mm · 2 of 27 slices shown (1 of 2)]
[im 1/27]
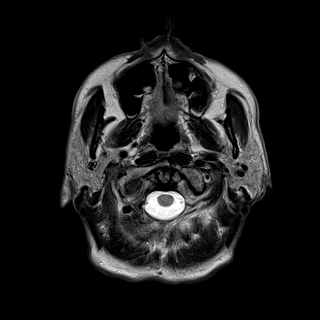
[im 27/27]
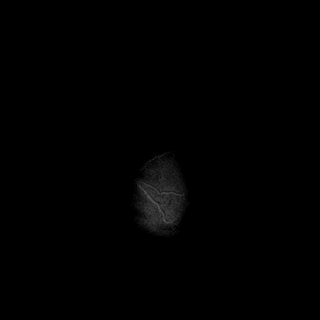

[Series 11: FLAIR · axial · 5.0mm · 0.45mm/px · z∈[-70,+85]mm · 2 of 27 slices shown]
[im 1/27]
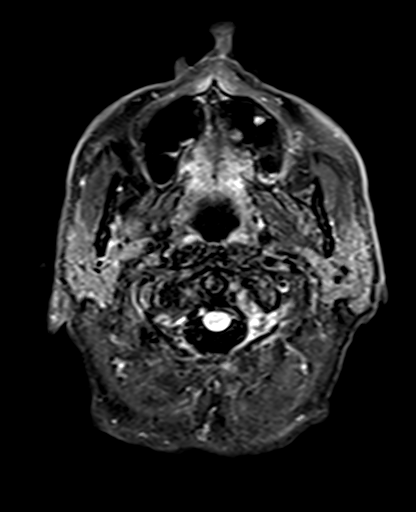
[im 27/27]
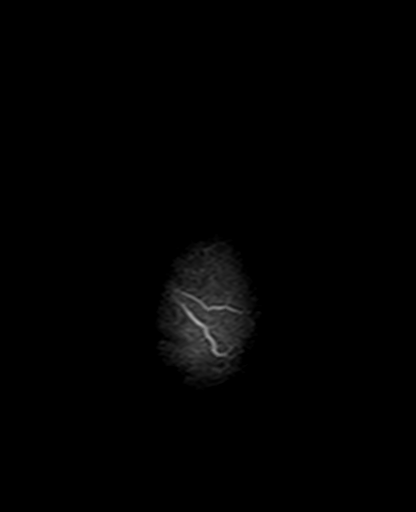

[Series 12: mag_images · axial · 3.0mm · 0.90mm/px · z∈[-69,+84]mm · 4 of 52 slices shown]
[im 1/52]
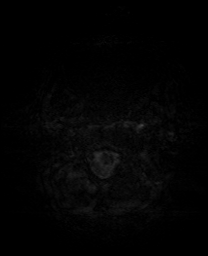
[im 18/52]
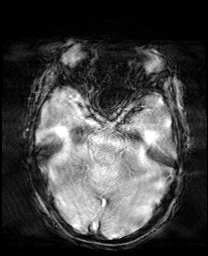
[im 35/52]
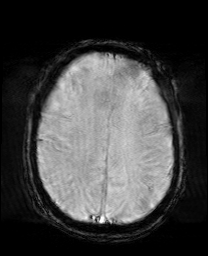
[im 52/52]
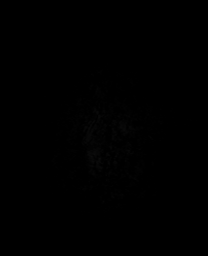

[Series 13: pha_images · axial · 3.0mm · 0.90mm/px · z∈[-69,+81]mm · 4 of 51 slices shown]
[im 1/51]
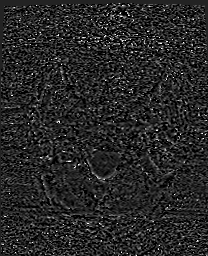
[im 17/51]
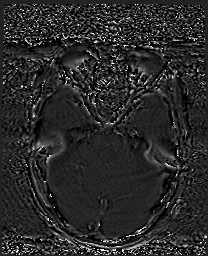
[im 34/51]
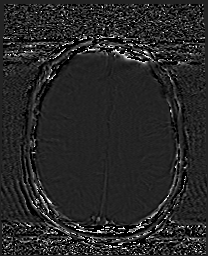
[im 51/51]
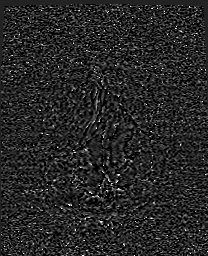

[Series 14: swi_images · axial · 3.0mm · 0.90mm/px · z∈[-69,+84]mm · 4 of 52 slices shown]
[im 1/52]
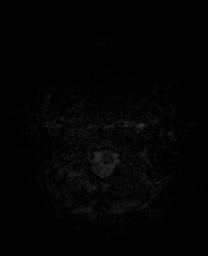
[im 18/52]
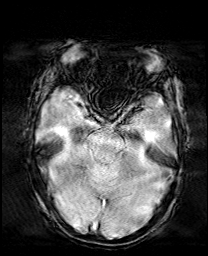
[im 35/52]
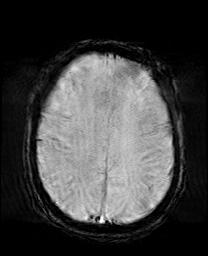
[im 52/52]
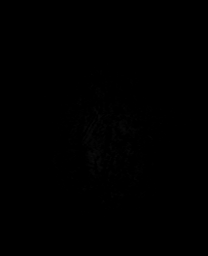

[Series 15: mip_images(sw) · axial · 24.0mm · 0.90mm/px · z∈[-58,+73]mm · 3 of 45 slices shown]
[im 1/45]
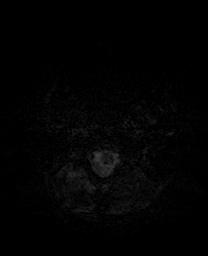
[im 23/45]
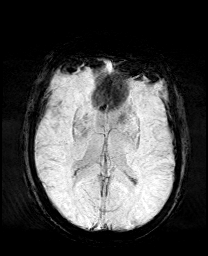
[im 45/45]
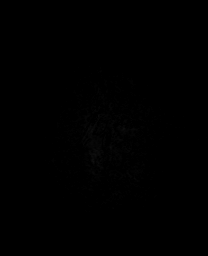

[Series 17: T2 · coronal · 5.0mm · 0.34mm/px · 2 of 29 slices shown (2 of 2)]
[im 1/29]
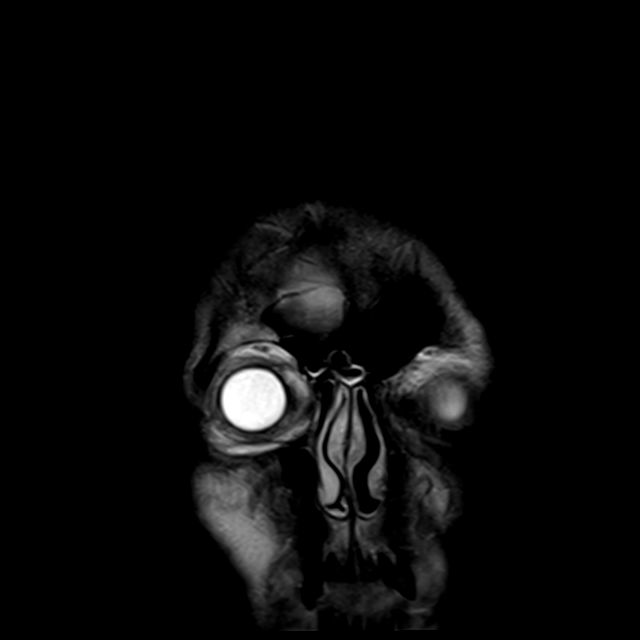
[im 29/29]
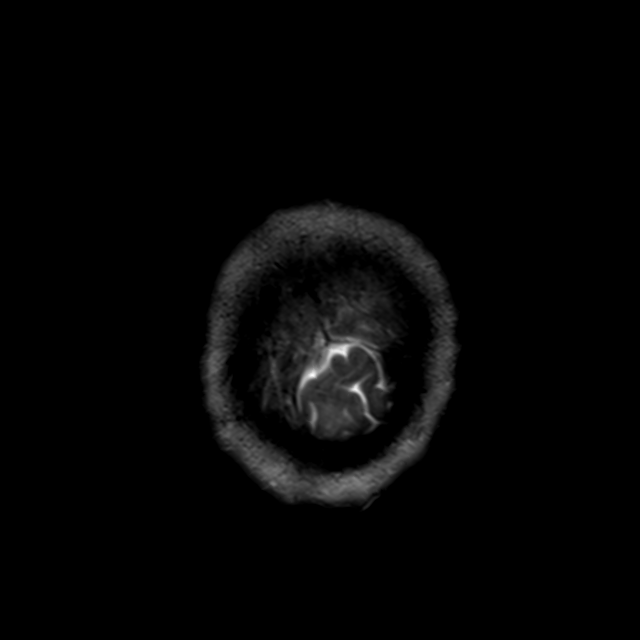

[44 of 48 positions shown; findings below may reference images not displayed]

FINDINGS: The study is mildly motion degraded.

Brain: There is no evidence of an acute infarct, intracranial
hemorrhage, mass, midline shift, or extra-axial fluid collection.
The ventricles and sulci are within normal limits for age. No
significant white matter disease is seen for age.

Vascular: Major intracranial vascular flow voids are preserved.

Skull and upper cervical spine: Unremarkable bone marrow signal.

Sinuses/Orbits: Unremarkable orbits. Mild mucosal thickening in the
paranasal sinuses. Clear mastoid air cells.

Other: None.
IMPRESSION: Unremarkable appearance of the brain for age.

## 2021-07-06 MED ORDER — LAMOTRIGINE 25 MG PO TABS: 150.0000 mg | ORAL_TABLET | Freq: Two times a day (BID) | ORAL | Status: DC

## 2021-07-06 MED ORDER — ROPINIROLE HCL 1 MG PO TABS
1.0000 mg | ORAL_TABLET | Freq: Every day | ORAL | Status: DC
  Administered 2021-07-06: 1 mg via ORAL
  Filled 2021-07-06 (×2): qty 1

## 2021-07-06 MED ORDER — ACETAMINOPHEN 160 MG/5ML PO SOLN: 650.0000 mg | ORAL | Status: DC | PRN

## 2021-07-06 MED ORDER — LITHIUM CARBONATE 300 MG PO CAPS: 900.0000 mg | ORAL_CAPSULE | Freq: Every day | ORAL | Status: DC

## 2021-07-06 MED ORDER — ACETAMINOPHEN 325 MG PO TABS
650.0000 mg | ORAL_TABLET | ORAL | Status: DC | PRN
  Administered 2021-07-06 – 2021-07-07 (×4): 650 mg via ORAL
  Filled 2021-07-06 (×4): qty 2

## 2021-07-06 MED ORDER — LAMOTRIGINE 25 MG PO TABS: 175.0000 mg | ORAL_TABLET | Freq: Two times a day (BID) | ORAL | Status: DC

## 2021-07-06 MED ORDER — CHLORHEXIDINE GLUCONATE CLOTH 2 % EX PADS: 6.0000 | MEDICATED_PAD | Freq: Every day | CUTANEOUS | Status: DC

## 2021-07-06 MED ORDER — LITHIUM CARBONATE 300 MG PO CAPS
300.0000 mg | ORAL_CAPSULE | Freq: Every morning | ORAL | Status: DC
  Administered 2021-07-06 – 2021-07-07 (×2): 300 mg via ORAL
  Filled 2021-07-06 (×2): qty 1

## 2021-07-06 MED ORDER — ACETAMINOPHEN 650 MG RE SUPP: 650.0000 mg | RECTAL | Status: DC | PRN

## 2021-07-06 MED ORDER — SENNOSIDES-DOCUSATE SODIUM 8.6-50 MG PO TABS: 1.0000 | ORAL_TABLET | Freq: Every evening | ORAL | Status: DC | PRN

## 2021-07-06 MED ORDER — LAMOTRIGINE 100 MG PO TABS
200.0000 mg | ORAL_TABLET | Freq: Two times a day (BID) | ORAL | Status: DC
  Administered 2021-07-06 – 2021-07-07 (×2): 200 mg via ORAL
  Filled 2021-07-06 (×2): qty 2

## 2021-07-06 MED ORDER — STROKE: EARLY STAGES OF RECOVERY BOOK
Freq: Once | Status: AC
  Filled 2021-07-06: qty 1

## 2021-07-06 MED ORDER — PANTOPRAZOLE SODIUM 40 MG PO TBEC
40.0000 mg | DELAYED_RELEASE_TABLET | Freq: Every day | ORAL | Status: DC
  Administered 2021-07-06 – 2021-07-07 (×2): 40 mg via ORAL
  Filled 2021-07-06 (×2): qty 1

## 2021-07-06 MED ORDER — LITHIUM CARBONATE 300 MG PO CAPS
600.0000 mg | ORAL_CAPSULE | Freq: Every evening | ORAL | Status: DC
  Administered 2021-07-06: 600 mg via ORAL
  Filled 2021-07-06 (×2): qty 2

## 2021-07-06 NOTE — Progress Notes (Signed)
EEG complete - results pending 

## 2021-07-06 NOTE — Plan of Care (Signed)
?  Problem: Education: ?Goal: Knowledge of secondary prevention will improve (SELECT ALL) ?Outcome: Progressing ?Goal: Knowledge of patient specific risk factors will improve (INDIVIDUALIZE FOR PATIENT) ?Outcome: Progressing ?Goal: Individualized Educational Video(s) ?Outcome: Progressing ?  ?Problem: Coping: ?Goal: Will verbalize positive feelings about self ?Outcome: Progressing ?Goal: Will identify appropriate support needs ?Outcome: Progressing ?  ?Problem: Health Behavior/Discharge Planning: ?Goal: Ability to manage health-related needs will improve ?Outcome: Progressing ?  ?Problem: Self-Care: ?Goal: Ability to participate in self-care as condition permits will improve ?Outcome: Progressing ?Goal: Verbalization of feelings and concerns over difficulty with self-care will improve ?Outcome: Progressing ?Goal: Ability to communicate needs accurately will improve ?Outcome: Progressing ?  ?Problem: Nutrition: ?Goal: Risk of aspiration will decrease ?Outcome: Progressing ?Goal: Dietary intake will improve ?Outcome: Progressing ?  ?Problem: Ischemic Stroke/TIA Tissue Perfusion: ?Goal: Complications of ischemic stroke/TIA will be minimized ?Outcome: Progressing ?  ?

## 2021-07-06 NOTE — TOC CAGE-AID Note (Signed)
Transition of Care (TOC) - CAGE-AID Screening ? ? ?Patient Details  ?Name: Matthew [name] ?MRN: [id number] ?Date of Birth: 1955-07-11 ? ?Transition of Care (TOC) CM/SW Contact:    ?Tenaya Hilyer C Tarpley-Carter, LCSWA ?Phone Number: ?07/06/2021, 3:16 PM ? ? ?Clinical Narrative: ?Pt participated in Cage-Aid.  Pt stated she does use marijuana.  Pt was offered resources, due to usage of substance.   ? ?Insurance underwriter, MSW, LCSW-A ?Pronouns:  She/Her/Hers ?[facility] ?Clinical Social Worker ?Direct Number:  [phone] ?[email or web address]   ? ? ?CAGE-AID Screening: ?  ? ?Have You Ever Felt You Ought to Cut Down on Your Drinking or Drug Use?: No ?Have People Annoyed You By Critizing Your Drinking Or Drug Use?: No ?Have You Felt Bad Or Guilty About Your Drinking Or Drug Use?: No ?Have You Ever Had a Drink or Used Drugs First Thing In The Morning to Steady Your Nerves or to Get Rid of a Hangover?: No ?CAGE-AID Score: 0 ? ?Substance Abuse Education Offered: Yes ? ?Substance abuse interventions: Educational Materials ? ? ? ? ? ? ?

## 2021-07-06 NOTE — Procedures (Signed)
Patient Name: Matthew [name]  ?MRN: [id number]  ?Epilepsy Attending: Charlsie Quest  ?Referring Physician/Provider: Marvel Plan, MD ?Date: 07/06/2021 ?Duration: 22.25 mins ? ?Patient history:  66 y.o. male PMHx HTN, HLD, THC with acute embolic stroke s/p TNK now with residual neologistic paraphasia.  EEG to evaluate for seizure ? ?Level of alertness: Awake, asleep ? ?AEDs during EEG study: None ? ?Technical aspects: This EEG study was done with scalp electrodes positioned according to the 10-20 International system of electrode placement. Electrical activity was acquired at a sampling rate of 500Hz  and reviewed with a high frequency filter of 70Hz  and a low frequency filter of 1Hz . EEG data were recorded continuously and digitally stored.  ? ?Description: The posterior dominant rhythm consists of 8 Hz activity of moderate voltage (25-35 uV) seen predominantly in posterior head regions, symmetric and reactive to eye opening and eye closing. Sleep was characterized by vertex waves, sleep spindles (12 to 14 Hz), maximal frontocentral region. Spikes were noted in left frontotemporal region, maximal F7/T7, predominantly during sleep.EEG also showed intermittent generalized and maximal left frontotemporal region 3 to 5 Hz theta-delta slowing.  Hyperventilation and photic stimulation were not performed.    ? ?ABNORMALITY ?- Spike,left frontotemporal region, maximal F7/T7 ?- Intermittent slow, generalized and maximal left frontotemporal region ? ?IMPRESSION: ?This study showed evidence of epileptogenicity and cortical dysfunction arising from left frontotemporal region. Additionally there is mild diffuse encephalopathy, nonspecific etiology. No seizures were seen throughout the recording. ? ?  ? ?

## 2021-07-06 NOTE — Evaluation (Signed)
Physical Therapy Evaluation ?Patient Details ?Name: Matthew [name] ?MRN: [id number] ?DOB: 01/27/1956 ?Today's Date: 07/06/2021 ? ?History of Present Illness ? The patient is a 66 year old male with a PMHx of colonic AVM, bipolar I disorder, HTN, migraines, OSA, one episode of speech deficit with seizure-like semioloogy diagnosed as possibly being secondary to partial complex seizure (episode occurred in January of 2022), panic attacks and prior substance abuse admitted with acute onset of garbled speech.  ?Clinical Impression ? Patient presents with decreased mobility due to generalized weakness and mild balance deficits. He was previously independent staying on main floor of two level home (wife reports due to recent "issues").  Patient active and enjoyed fly fishing and playing guitar.  Currently minguard for safety getting up for hallway ambulation.  Feel he will progress and likely not need PT at d/c.  PT will continue to follow acutely to ensure progression and safety for home d/c with wife support.    ?   ? ?Recommendations for follow up therapy are one component of a multi-disciplinary discharge planning process, led by the attending physician.  Recommendations may be updated based on patient status, additional functional criteria and insurance authorization. ? ?Follow Up Recommendations No PT follow up ? ?  ?Assistance Recommended at Discharge Set up Supervision/Assistance  ?Patient can return home with the following ? Direct supervision/assist for medications management;Assist for transportation ? ?  ?Equipment Recommendations None recommended by PT  ?Recommendations for Other Services ?    ?  ?Functional Status Assessment Patient has had a recent decline in their functional status and demonstrates the ability to make significant improvements in function in a reasonable and predictable amount of time.  ? ?  ?Precautions / Restrictions Precautions ?Precautions: Fall  ? ?  ? ?Mobility ? Bed Mobility ?Overal  bed mobility: Needs Assistance ?Bed Mobility: Supine to Sit ?  ?  ?Supine to sit: Supervision, HOB elevated ?  ?  ?General bed mobility comments: assist for lines ?  ? ?Transfers ?Overall transfer level: Needs assistance ?Equipment used: None ?Transfers: Sit to/from Stand ?Sit to Stand: Min guard ?  ?  ?  ?  ?  ?General transfer comment: for balance/safety ?  ? ?Ambulation/Gait ?Ambulation/Gait assistance: Min assist, Min guard ?Gait Distance (Feet): 100 Feet ?Assistive device: 1 person hand held assist ?Gait Pattern/deviations: Step-through pattern, Decreased stride length ?  ?  ?  ?General Gait Details: mild instability first time up on his feet, minguard after increased ambulation, initially with HHA ? ?Stairs ?  ?  ?  ?  ?  ? ?Wheelchair Mobility ?  ? ?Modified Rankin (Stroke Patients Only) ?Modified Rankin (Stroke Patients Only) ?Pre-Morbid Rankin Score: No significant disability ?Modified Rankin: Moderately severe disability ? ?  ? ?Balance Overall balance assessment: Needs assistance ?  ?Sitting balance-Leahy Scale: Good ?  ?  ?  ?Standing balance-Leahy Scale: Fair ?  ?  ?  ?  ?  ?  ?  ?  ?  ?  ?  ?  ?   ? ? ? ?Pertinent Vitals/Pain Pain Assessment ?Pain Assessment: No/denies pain  ? ? ?Home Living Family/patient expects to be discharged to:: Private residence ?Living Arrangements: Spouse/significant other ?Available Help at Discharge: Family;Available PRN/intermittently ?Type of Home: House ?Home Access: Level entry ?  ?  ?Alternate Level Stairs-Number of Steps: has been staying on main level recently ?Home Layout: Two level;Able to live on main level with bedroom/bathroom ?Home Equipment: Shower seat - built in;Grab bars - tub/shower ?  Additional Comments: Enjoys fly fishing, banjo and guitar playing  ?  ?Prior Function Prior Level of Function : Independent/Modified Independent ?  ?  ?  ?  ?  ?  ?  ?  ?  ? ? ?Hand Dominance  ?   ? ?  ?Extremity/Trunk Assessment  ? Upper Extremity Assessment ?Upper Extremity  Assessment: Overall WFL for tasks assessed ?  ? ?Lower Extremity Assessment ?Lower Extremity Assessment: Overall WFL for tasks assessed ?  ? ?   ?Communication  ? Communication: Expressive difficulties (intermittent difficulty with using wrong word)  ?Cognition Arousal/Alertness: Awake/alert ?Behavior During Therapy: [facility] for tasks assessed/performed ?Overall Cognitive Status: Within Functional Limits for tasks assessed ?  ?  ?  ?  ?  ?  ?  ?  ?  ?  ?  ?  ?  ?  ?  ?  ?  ?  ?  ? ?  ?General Comments General comments (skin integrity, edema, etc.): wife in the room and concern for more non-sensical speech an hour ago, RN in to reassess prior to PT eval ? ?  ?Exercises    ? ?Assessment/Plan  ?  ?PT Assessment Patient needs continued PT services  ?PT Problem List Decreased mobility;Decreased activity tolerance;Decreased balance ? ?   ?  ?PT Treatment Interventions DME instruction;Therapeutic activities;Gait training;Therapeutic exercise;Patient/family education;Balance training;Functional mobility training;Stair training   ? ?PT Goals (Current goals can be found in the Care Plan section)  ?Acute Rehab PT Goals ?Patient Stated Goal: to return to independent ?PT Goal Formulation: With patient/family ?Time For Goal Achievement: 07/13/21 ?Potential to Achieve Goals: Good ? ?  ?Frequency Min 4X/week ?  ? ? ?Co-evaluation   ?  ?  ?  ?  ? ? ?  ?AM-PAC PT "6 Clicks" Mobility  ?Outcome Measure Help needed turning from your back to your side while in a flat bed without using bedrails?: None ?Help needed moving from lying on your back to sitting on the side of a flat bed without using bedrails?: None ?Help needed moving to and from a bed to a chair (including a wheelchair)?: A Matthew ?Help needed standing up from a chair using your arms (e.g., wheelchair or bedside chair)?: A Matthew ?Help needed to walk in hospital room?: A Matthew ?Help needed climbing 3-5 steps with a railing? : Total ?6 Click Score: 18 ? ?  ?End of Session  Equipment Utilized During Treatment: Gait belt ?Activity Tolerance: Patient tolerated treatment well ?Patient left: in bed;with call bell/phone within reach;with family/visitor present;with SCD's reapplied;with bed alarm set ?  ?PT Visit Diagnosis: Muscle weakness (generalized) (M62.81);Other abnormalities of gait and mobility (R26.89) ?  ? ?Time: 1340-1402 ?PT Time Calculation (min) (ACUTE ONLY): 22 min ? ? ?Charges:   PT Evaluation ?$PT Eval Moderate Complexity: 1 Mod ?  ?  ?   ? ? ?Sheran Lawless, PT ?Acute Rehabilitation Services ?Pager:[phone] ?[phone] ?07/06/2021 ? ? ?Elray Mcgregor ?07/06/2021, 3:22 PM ? ?

## 2021-07-06 NOTE — H&P (Signed)
Neurology Consult H&P ? ?Stark Jock ?MR# [id number] ?07/06/2021 ? ? ?CC: acute aphasia ? ?History is obtained from: chart. ? ?HPI: Matthew [name] is a 66 y.o. male PMHx as reviewed below LKW 1500 with acute onset global aphasia and NIHSS 7, NCT head without acute ischemic changes ASPECTS 10, CTA no LVO and received tNK at OSH and subsequently transferred for post thrombolytic monitoring. ? ? ?The following information was taken from Dr. Yvetta Coder consult note on 07/05/2021: ? ?"one episode of speech deficit with seizure-like semioloogy diagnosed as possibly being secondary to partial complex seizure (episode occurred in January of 2022), panic attacks and prior substance abuse who presents via [place] with wife for evaluation of acute onset of garbled speech. LKN was 3:00 PM, at which time the first symptoms were noticed by his wife, consisting of nonsensical speech output described as "gibberish" as well as difficulty with speech comprehension. Wife then drove him to [facility] for evaluation, where a Code Stroke was called.  ?  ?Wife states that he did not seem to be having a headache. He did vomit once after arrival and then again after he was moved to an ED room. BP on arrival was 180/98 with glucose of 114. He did experience bradycardia with a rate in the 20's after the first vomiting spell.  ?  ?Not on scheduled ASA, Plavix or a blood thinner.  ?  ?Of note, the seizure-like spell last year consisted of a fall and possible seizure activity. The episodes he was describing sounded concerning for partial complex seizures and he was continued on Lamictal, which had been previously prescribed for his bipolar disorder. Seen as an outpatient by [facility]." ? ?ROS: A complete ROS was performed and is negative except as noted in the HPI.  ?Past Medical History:  ?Diagnosis Date  ? Anxiety   ? on meds  ? AVM (arteriovenous malformation) of colon 02/04/2021  ? Bipolar I disorder, most recent episode (or current) mixed, moderate   ?  GERD (gastroesophageal reflux disease)   ? Hypertension   ? on meds  ? Migraines   ? OSA (obstructive sleep apnea)   ? retested-bipap not needed  ? Panic attacks   ? Seizures ([place])   ? last known 05/2020;  ? Substance abuse ([place])   ? ?Family History  ?Problem Relation Age of Onset  ? Depression Mother   ? Hypertension Father   ? Cancer Father 84  ?     brain tumor  ? Heart disease Paternal Grandmother   ? Arthritis Other   ? Hypertension Other   ? Colon polyps Neg Hx   ? Colon cancer Neg Hx   ? Esophageal cancer Neg Hx   ? Rectal cancer Neg Hx   ? Stomach cancer Neg Hx   ? ?Social History:  reports that he has quit smoking. His smoking use included pipe and cigars. He has never used smokeless tobacco. He reports that he does not drink alcohol and does not use drugs. ? ?Prior to Admission medications   ?Medication Sig Start Date End Date Taking? Authorizing Provider  ?amLODipine (NORVASC) 5 MG tablet Take 5 mg by mouth daily.   Yes [provider]  ?benazepril (LOTENSIN) 20 MG tablet Take 20 mg by mouth daily.   Yes [provider]  ?carvedilol (COREG) 25 MG tablet TAKE 1 TABLET BY MOUTH TWICE DAILY WITH A MEAL ?Patient taking differently: Take 25 mg by mouth 2 (two) times daily with a meal. 10/04/11  Yes Plotnikov, Evie Lacks, MD  ?lamoTRIgine (LAMICTAL) 150 MG tablet Take 1 tablet (150 mg total) by mouth 2 (two) times daily. 02/28/21  Yes Frann Rider, NP  ?lithium 300 MG capsule Take 900 mg by mouth at bedtime.   Yes [provider]  ?rOPINIRole (REQUIP) 1 MG tablet Take 1 mg by mouth at bedtime. 07/01/18  Yes [provider]  ?zaleplon (SONATA) 10 MG capsule Take 10 mg by mouth at bedtime as needed for sleep.   Yes [provider]  ?amLODipine-benazepril (LOTREL) 5-20 MG per capsule TAKE 1 CAPSULE BY MOUTH DAILY ?Patient not taking: Reported on 07/05/2021 10/04/11   Plotnikov, Evie Lacks, MD  ?naratriptan (AMERGE) 2.5 MG tablet Take 2.5 mg by mouth as needed. Marland Kitchen ?Patient not  taking: Reported on 07/05/2021    [provider]  ?QUEtiapine (SEROQUEL) 400 MG tablet Take 400 mg by mouth at bedtime. ?Patient not taking: Reported on 07/05/2021 11/06/20   [provider]  ?topiramate (TOPAMAX) 50 MG tablet Take 1 tablet (50 mg total) by mouth daily. ?Patient not taking: Reported on 07/05/2021 02/10/21   Frann Rider, NP  ? ? ?Exam: ?Current vital signs: ?BP (!) 141/97 (BP Location: Left Arm)   Pulse 78   Temp 99.1 ?F (37.3 ?C) (Oral)   Resp 20   Ht 5\' 10"  (1.778 m)   Wt 71.9 kg   SpO2 97%   BMI 22.74 kg/m?  ? ?Physical Exam  ?Constitutional: Appears well-developed and well-nourished.  ?Psych: Affect appropriate to situation ?Eyes: No scleral injection ?HENT: No OP obstruction. ?Head: Normocephalic.  ?Cardiovascular: Normal rate and regular rhythm.  ?Respiratory: Effort normal, symmetric excursions bilaterally, no audible wheezing. ?GI: Soft.  No distension. There is no tenderness.  ?Skin: WDI ? ?Neuro: ?Mental Status: ?Patient is awake, alert, oriented to person, place, and situation. ?Speech moderate impaired fluency, intact comprehension and impaired repetition with neologistic paraphasia ?Visual Fields are full. Pupils are equal, round, and reactive to light. ?EOMI without ptosis or diplopia.  ?Facial sensation is symmetric to temperature ?Facial movement is symmetric.  ?Hearing is intact to voice. ?Uvula midline and palate elevates symmetrically. ?Shoulder shrug is symmetric. ?Tongue is midline without atrophy or fasciculations.  ?Tone is normal. Bulk is normal. 5/5 strength was present in all four extremities. ?Sensation is symmetric to light touch and temperature in the arms and legs. ?Deep Tendon Reflexes: 2+ and symmetric in the biceps and patellae. ?Toes are downgoing bilaterally. ?FNF and HKS are intact bilaterally. ?Gait - Deferred ? ?I have reviewed labs in epic and the pertinent results are: ?GLU 114 ?THC (+) ? ?I have reviewed the images obtained: ?NCT head  showed no acute ischemic changes, hemorrhage, mass. ?CTA head and neck showed no acute intracranial large vessel occlusion. ? ?Assessment: BERMAN GURSKI is a 66 y.o. male PMHx HTN, HLD, THC with acute embolic stroke s/p TNK now with residual neologistic paraphasia. ? ? ?Impression:  ?Acute embolic stroke ?Neologistic paraphasia ?S/p TNK ?HTN ?HLD ? ?Plan: ?- Post-TNK order set to include frequent neuro checks and BP management.  ?- No antiplatelets or anticoagulants for 24 hours following thrombolytics.  ?- DVT prophylaxis with SCDs.  ?- Will need to be started on a statin.  ?- Will need to start antiplatelet therapy after follow up CT 24 hours is negative for hemorrhage. ?- TTE.  ?- MRI brain -ordered ?- PT/OT/Speech.  ?- NPO until passes swallow evaluation.  ?- Telemetry monitoring ?- Fasting lipid panel, HgbA1c ? ? ?This patient is  critically ill and at significant risk of neurological worsening, death and care requires constant monitoring of vital signs, hemodynamics,respiratory and cardiac monitoring, neurological assessment, discussion with family, other specialists and medical decision making of high complexity. I spent 70 minutes of neurocritical care time  in the care of  this patient. This was time spent independent of any time provided by nurse practitioner or PA. ? ?Electronically signed by:  ?Lynnae Sandhoff, MD ?Page: [id number] ?07/06/2021, 12:19 AM ? ?If 7pm- 7am, please page neurology on call as listed in [place]. ? ?

## 2021-07-06 NOTE — Progress Notes (Addendum)
STROKE TEAM PROGRESS NOTE   ATTENDING NOTE: I reviewed above note and agree with the assessment and plan. Pt was seen and examined.   66 year old male with history of bipolar disorder on Lamictal and lithium, hypertension, seizure, migraine, OSA, substance abuse admitted for episode of double speech, confusion.  Per wife, patient went [place].  At the way back, he was driving, seems confused and missed the turn. When wife tried to talk to him, his head turning away and did not talk. But kept driving for another 2 hours but very quite on the road. In the gas station later, he seemed to have gabbled speech and some aphasia. She drove back the rest of the trip and pt slept in the car. Back at home, pt woke up in car and was coherent with speech. Then shortly after arrival at home, pt again had staring off, not responding, stumbling in the hallway, wife sent him to ER and pt was not able to recall. In ER, he received TNK.   He was admitted 05/2020 for stereotypical episodes of staring off, strange coughing/breathing sound, not responding.  EEG normal, considered seizure episodes.  Patient missed Lamictal for some time for bipolar disorder, restarted on Lamictal 200 mg nightly on discharge. Follow with [facility] over the last year, continue Lamictal 200 mg nightly and tapered off Topamax for migraine.  Currently pt back to baseline, neuro intact.  He is on Lamictal 150 mg twice daily PTA.  CT/CTA head and neck/MRI/2D echo unremarkable.  However, EEG showed epileptogenicity and cortical dysfunction arising from left frontotemporal region.  LDL, A1c pending.  UDS positive for THC.  Creatinine 1.08.  Patient current presentation concerning for seizure.  TIA unlikely.  Will increase Lamictal to 200 mg twice daily, continue lithium and Requip, and other home meds.  Seizure precaution  For detailed assessment and plan, please refer to above as I have made changes wherever appropriate.    Marvel PlanJindong Olie Dibert, MD PhD Stroke Neurology 07/06/2021 6:00 PM  This patient is critically ill due to seizure, status post TNK and at significant risk of neurological worsening, death form bleeding from TNK, status epilepticus. This patient's care requires constant monitoring of vital signs, hemodynamics, respiratory and cardiac monitoring, review of multiple databases, neurological assessment, discussion with family, other specialists and medical decision making of high complexity. I spent 30 minutes of neurocritical care time in the care of this patient. I had long discussion with patient and wife at bedside, updated pt current condition, treatment plan and potential prognosis, and answered all the questions.  They expressed understanding and appreciation.      INTERVAL HISTORY [place] yesterday morning and on the way back he missed a turn around 11am and then he looked at his wife "in an odd way" and then he stopped talking.  His wife initially thought that this could have been because they were in a part of town that has been triggering for him.  However, he then he had garbled speech when stopping at the gas station around 1430.  She drove the rest of the way home and when they got out of the car everything appeared normal.  They both went to use the bathroom and when she came out she noticed that he was walking stooped over and staring at the floor and he told her that he was okay with going to the hospital.  His wife noticed that prior to his previous episode last year  and this episode that he had an abnormal cough before changes to his speech. Topamax has been tapered off due to excessive weight loss and memory difficulties. Lamictal decreased to 150mg  BID.  Lithium has been stable.  Wife at the bedside, reporting headache 5/10. PRN tylenol given with relief.  Plan for MRI at 1530    Vitals:   07/06/21 0400 07/06/21 0500 07/06/21 0600 07/06/21 0700  BP: (!) 147/71 (!) 142/69  132/82 (!) 151/84  Pulse: 65 66 (!) 58 (!) 57  Resp: 17 16 20 20   Temp: 98 F (36.7 C)     TempSrc: Oral     SpO2: 96% 94% 95% 96%  Weight:      Height:       CBC:  Recent Labs  Lab 07/05/21 1732  WBC 8.9  NEUTROABS 7.3  HGB 14.5  HCT 43.5  MCV 92.9  PLT 237   Basic Metabolic Panel:  Recent Labs  Lab 07/05/21 1732  NA 139  K 4.3  CL 105  CO2 26  GLUCOSE 114*  BUN 18  CREATININE 1.08  CALCIUM 9.6   Lipid Panel: No results for input(s): CHOL, TRIG, HDL, CHOLHDL, VLDL, LDLCALC in the last 168 hours. HgbA1c: No results for input(s): HGBA1C in the last 168 hours. Urine Drug Screen:  Recent Labs  Lab 07/05/21 1855  LABOPIA NONE DETECTED  COCAINSCRNUR NONE DETECTED  LABBENZ NONE DETECTED  AMPHETMU NONE DETECTED  THCU POSITIVE*  LABBARB NONE DETECTED    Alcohol Level  Recent Labs  Lab 07/05/21 1732  ETH <10    IMAGING past 24 hours CT HEAD CODE STROKE WO CONTRAST  Result Date: 07/05/2021 CLINICAL DATA:  Code stroke. Neuro deficit, acute, stroke suspected. Confusion and speech disturbance. EXAM: CT HEAD WITHOUT CONTRAST TECHNIQUE: Contiguous axial images were obtained from the base of the skull through the vertex without intravenous contrast. RADIATION DOSE REDUCTION: This exam was performed according to the departmental dose-optimization program which includes automated exposure control, adjustment of the mA and/or kV according to patient size and/or use of iterative reconstruction technique. COMPARISON:  05/04/2020 FINDINGS: Brain: Normal appearance without evidence of old or acute infarction, mass lesion, hemorrhage, hydrocephalus or extra-axial collection. Vascular: Mild tortuosity of the vessels at the base of the brain. No acute vascular finding. Skull: Normal Sinuses/Orbits: Clear except for an opacified posterior ethmoid air cell on the right. Orbits negative. Other: None ASPECTS (Alberta Stroke Program Early CT Score) - Ganglionic level infarction (caudate,  lentiform nuclei, internal capsule, insula, M1-M3 cortex): 7 - Supraganglionic infarction (M4-M6 cortex): 3 Total score (0-10 with 10 being normal): 10 IMPRESSION: 1. Normal head CT.  No acute finding. 2. ASPECTS is 10 3. These results were called by telephone at the time of interpretation on 07/05/2021 at 6:02 pm to provider MELANIE BELFI , who verbally acknowledged these results. Electronically Signed   By: 07/02/2020 M.D.   On: 07/05/2021 18:03   CT ANGIO HEAD NECK W WO CM (CODE STROKE)  Result Date: 07/05/2021 CLINICAL DATA:  Neuro deficit, acute, stroke suspected. Confusion. Word finding difficulty. EXAM: CT ANGIOGRAPHY HEAD AND NECK TECHNIQUE: Multidetector CT imaging of the head and neck was performed using the standard protocol during bolus administration of intravenous contrast. Multiplanar CT image reconstructions and MIPs were obtained to evaluate the vascular anatomy. Carotid stenosis measurements (when applicable) are obtained utilizing NASCET criteria, using the distal internal carotid diameter as the denominator. RADIATION DOSE REDUCTION: This exam was performed according to the departmental dose-optimization  program which includes automated exposure control, adjustment of the mA and/or kV according to patient size and/or use of iterative reconstruction technique. CONTRAST:  80mL OMNIPAQUE IOHEXOL 350 MG/ML SOLN COMPARISON:  Head CT earlier same day.  Brain MRI 05/05/2020. FINDINGS: CTA NECK FINDINGS Aortic arch: Aortic atherosclerosis. No aneurysm or dissection. Branching pattern is normal without origin stenosis. Right carotid system: Common carotid artery widely patent to the bifurcation. Minimal atherosclerotic plaque at the carotid bifurcation but no stenosis. Cervical ICA widely patent. Left carotid system: Common carotid artery widely patent to the bifurcation. Calcified plaque at the carotid bifurcation and ICA bulb but no stenosis. Cervical ICA is tortuous but widely patent. Vertebral  arteries: Both vertebral artery origins are widely patent. There is calcified plaque adjacent to the right vertebral artery origin but without stenosis greater than 30%. Skeleton: Ordinary cervical spondylosis.  Mild scoliotic curvature. Other neck: No mass or lymphadenopathy. Upper chest: Lung apices are clear. Review of the MIP images confirms the above findings CTA HEAD FINDINGS Anterior circulation: Both internal carotid arteries are patent through the skull base and siphon regions. There is ordinary siphon atherosclerotic calcification but no stenosis or aneurysm. Both posterior cerebral arteries receive the majority of there supply from the anterior circulation. Both middle cerebral arteries are widely patent. There is a MCA trifurcation on the left with mild ectasia of the MCA at the trifurcation but no frank aneurysm. No MCA large vessel occlusion on either side. There is an azygous anterior cerebral artery which is widely patent. Posterior circulation: Both vertebral arteries are patent through the foramen magnum. Both posteroinferior cerebellar arteries are patent. Both vertebral arteries reach the basilar. The basilar is small as there is fetal origin of both posterior cerebral arteries. No choir posterior circulation disease is identified. Venous sinuses: Patent and normal. Anatomic variants: None other significant. Review of the MIP images confirms the above findings IMPRESSION: No acute intracranial large vessel occlusion. Mild atherosclerotic change at the carotid bifurcation regions but without stenosis or significant irregularity. Ordinary siphon atherosclerotic disease but without stenosis greater than 30%. Several anatomic variants, including fetal origin of both posterior cerebral arteries from the anterior circulation, as a gas anterior cerebral artery, and trifurcation of the left middle cerebral artery with mild ectasia at the trifurcation point but no frank aneurysm. Electronically Signed    By: Paulina Fusi M.D.   On: 07/05/2021 18:54    PHYSICAL EXAM  Physical Exam  Constitutional: Appears well-developed and well-nourished.  Psych: Affect appropriate to situation Cardiovascular: Normal rate and regular rhythm.  Respiratory: Effort normal, non-labored breathing  Neuro: Mental Status: Patient is awake, alert, oriented to person, place, month, year, and situation. Patient is able to give a clear and coherent history. Expressive aphasia appears improved.  Slight difficulty with repeating " no ifs, ands, or buts about it" but is able to hold a conversation without difficulty. Cranial Nerves: II: Visual Fields are full. Pupils are equal, round, and reactive to light.   III,IV, VI: EOMI without ptosis or diploplia.  V: Facial sensation is symmetric to temperature VII: Facial movement is symmetric resting and smiling VIII: Hearing is intact to voice X: Palate elevates symmetrically XI: Shoulder shrug is symmetric. XII: Tongue protrudes midline without atrophy or fasciculations.  Motor: Tone is normal. Bulk is normal. 5/5 strength was present in all four extremities.  Sensory: Sensation is symmetric to light touch and temperature in the arms and legs. No extinction to DSS present.  Deep Tendon Reflexes: 2+ and symmetric  in the biceps and patellae.  Plantars: Toes are downgoing bilaterally.  Cerebellar: FNF and HKS are intact bilaterally   ASSESSMENT/PLAN Mr. DILLINGER ASTON is a 66 y.o. male with history of colonic AVM, bipolar 1 disorder, hypertension, migraines, OSA, Seizure-Like episode Jan 2022, panic attacks, prior substance abuse presenting with acute onset of garbled speech while driving. TNKase given. MRI pending.  EEG showed evidence of epileptogenicity and cortical dysfunction arising from the left frontal temporal region, will discuss which medications to resume for this.   Possible most likely seizure Stroke like symptoms s/p TNKase History and current  episode concerning for seizure Code Stroke CT head No acute abnormality. CTA head & neck no acute LVO MRI pending 2D Echo EF is 55-60%.  Left atrial size mild to moderately dilated, trivial mitral valve regurgitation, mild calcification of the aortic valve, aortic valve regurgitation is trivial EEG- epileptogenicity and cortical dysfunction arising from left frontotemporal region. Additionally there is mild diffuse encephalopathy, nonspecific etiology. No seizures were seen throughout the recording. LDL pending HgbA1c pending VTE prophylaxis - SCDs No antithrombotic prior to admission, now not on antithrombotics within 24 hours s/p TNKase and also no clear indication.  Was on Lamictal 150 mg twice daily PTA, will increase Lamictal to 200 mg twice daily for seizure control. Therapy recommendations:  pending Disposition:  pending, recommend no driving until seizure-free for 6 months and under physician's care.  History of seizure 05/2020 patient admitted for stereotypical episodes of staring off, strange coughing/breathing sound, not responding.  EEG normal, considered seizure episodes.  Patient missed Lamictal for some time for bipolar disorder, restarted on Lamictal 200 mg nightly on discharge. Follow with [facility] over the last year, continue Lamictal 200 mg nightly and tapered off Topamax for migraine.  Hypertension Home meds:  benazepril 20mg , norvasc 10mg  Stable BP goal < 180/105 Long-term BP goal normotensive  Other Stroke Risk Factors Advanced Age >/= 104  Migraines Was on topamax-recently tapered off by neurology due to excessive weight loss and memory difficulties  Other Active Problems Bipolar disorder Lamictal on 150 mg twice daily PTA Stable, on lithium, medication resumed Followed by psychiatry outpatient  Hospital day # 1  Patient seen and examined by NP/APP with MD. MD to update note as needed.   , DNP, FNP-BC [facility] Pager: [phone]   To contact Stroke Continuity provider, please refer to Elmer Picker. After hours, contact General Neurology

## 2021-07-06 NOTE — Progress Notes (Signed)
?  Echocardiogram ?2D Echocardiogram has been performed. ? ?Gerda Diss ?07/06/2021, 12:01 PM ?

## 2021-07-06 NOTE — Progress Notes (Signed)
?  Transition of Care (TOC) Screening Note ? ? ?Patient Details  ?Name: Matthew [name] ?Date of Birth: [birth year] ? ? ?Transition of Care (TOC) CM/SW Contact:    ?Mearl Latin, LCSW ?Phone Number: ?07/06/2021, 10:03 AM ? ? ? ?Transition of Care Department [facility]) has reviewed patient and no TOC needs have been identified at this time. We will continue to monitor patient advancement through interdisciplinary progression rounds. If new patient transition needs arise, please place a TOC consult. ? ? ?

## 2021-07-07 ENCOUNTER — Inpatient Hospital Stay (HOSPITAL_COMMUNITY): Payer: Medicare Other

## 2021-07-07 LAB — LIPID PANEL
Cholesterol: 122 mg/dL (ref 0–200)
HDL: 34 mg/dL — ABNORMAL LOW (ref >40)
LDL Cholesterol: 75 mg/dL (ref 0–99)
Total CHOL/HDL Ratio: 3.6 RATIO
Triglycerides: 67 mg/dL (ref <150)
VLDL: 13 mg/dL (ref 0–40)

## 2021-07-07 LAB — BASIC METABOLIC PANEL
Anion gap: 7 (ref 5–15)
BUN: 20 mg/dL (ref 8–23)
CO2: 23 mmol/L (ref 22–32)
Calcium: 9.6 mg/dL (ref 8.9–10.3)
Chloride: 108 mmol/L (ref 98–111)
Creatinine, Ser: 1.01 mg/dL (ref 0.61–1.24)
GFR, Estimated: >60 mL/min (ref >60)
Glucose, Bld: 104 mg/dL — ABNORMAL HIGH (ref 70–99)
Potassium: 3.8 mmol/L (ref 3.5–5.1)
Sodium: 138 mmol/L (ref 135–145)

## 2021-07-07 LAB — CBC
HCT: 40.4 % (ref 39.0–52.0)
Hemoglobin: 13.9 g/dL (ref 13.0–17.0)
MCH: 31.3 pg (ref 26.0–34.0)
MCHC: 34.4 g/dL (ref 30.0–36.0)
MCV: 91 fL (ref 80.0–100.0)
Platelets: 241 10*3/uL (ref 150–400)
RBC: 4.44 MIL/uL (ref 4.22–5.81)
RDW: 12.5 % (ref 11.5–15.5)
WBC: 11.7 10*3/uL — ABNORMAL HIGH (ref 4.0–10.5)
nRBC: 0 % (ref 0.0–0.2)

## 2021-07-07 LAB — HEMOGLOBIN A1C
Hgb A1c MFr Bld: 4.6 % — ABNORMAL LOW (ref 4.8–5.6)
Mean Plasma Glucose: 85.32 mg/dL

## 2021-07-07 LAB — HIV ANTIBODY (ROUTINE TESTING W REFLEX): HIV Screen 4th Generation wRfx: NONREACTIVE

## 2021-07-07 IMAGING — CT CT HEAD W/O CM
5 of 6 series · 17 of 47 positions shown, 18 images · non-contrast
Comparison: Brain MRI from yesterday

CLINICAL DATA: Mental status change with unknown cause



[Series 3: head bone · axial · 0.49mm/px · z∈[-108,-0]mm · 7 of 78 slices shown]
[im 8/78  bone]
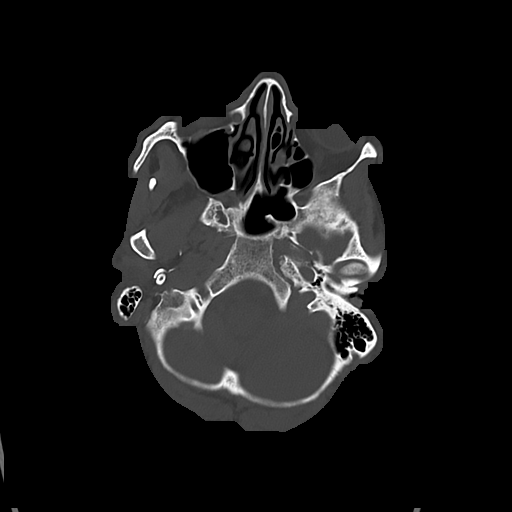
[im 16/78  bone]
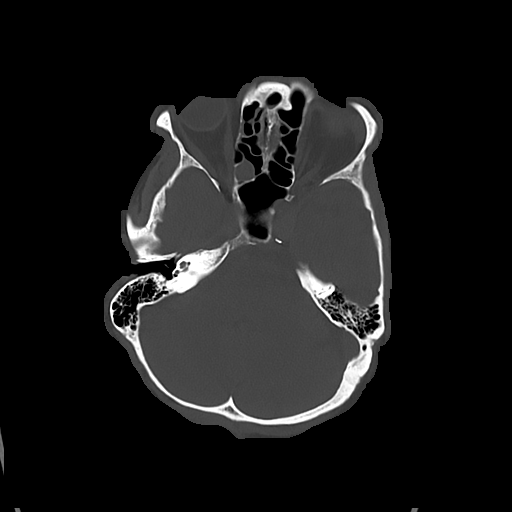
[im 24/78  bone]
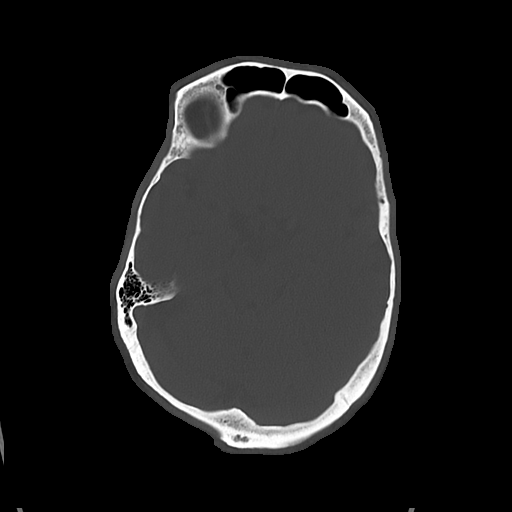
[im 31/78  bone]
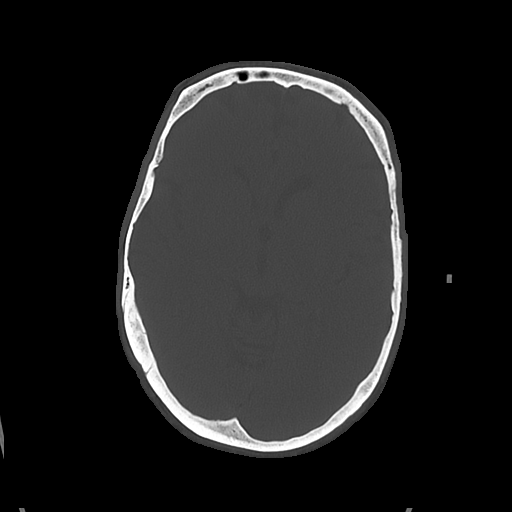
[im 47/78  bone]
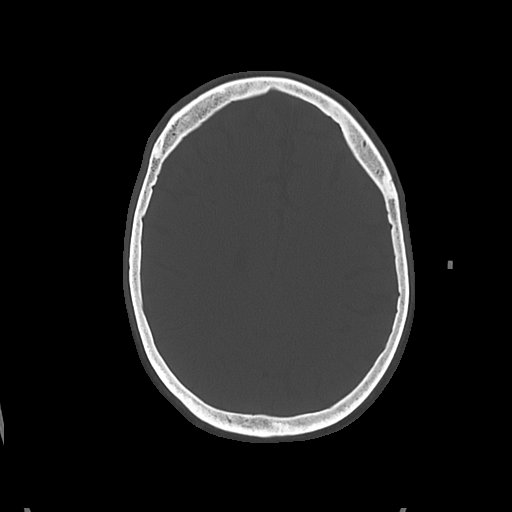
[im 54/78  bone]
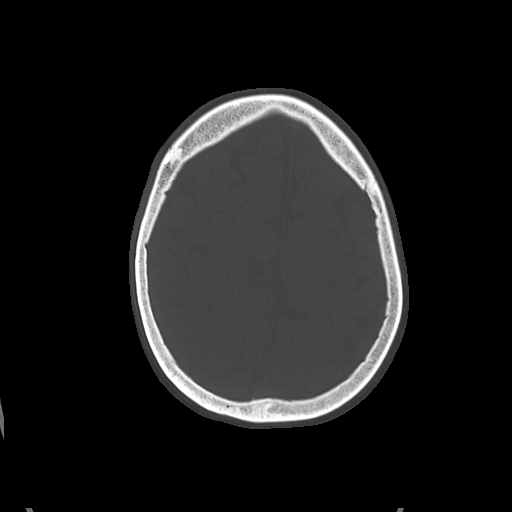
[im 62/78  bone]
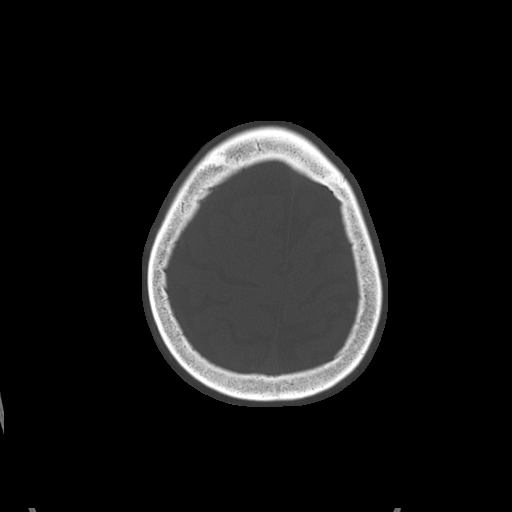

[Series 4: head wo · axial · 0.49mm/px · z∈[-72,-22]mm · 2 of 32 slices shown, 3 images]
[im 11/32  brain]
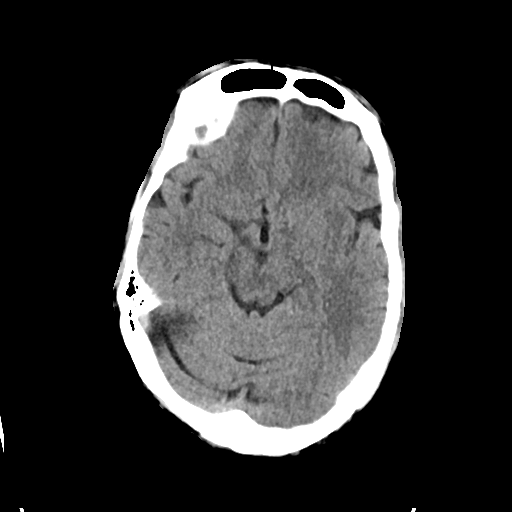
[im 11/32  bone]
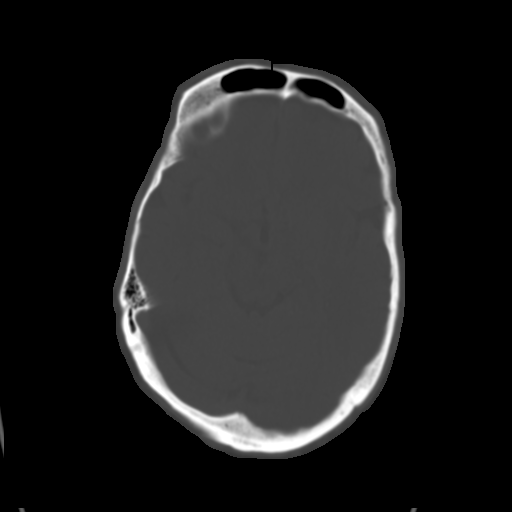
[im 21/32  brain]
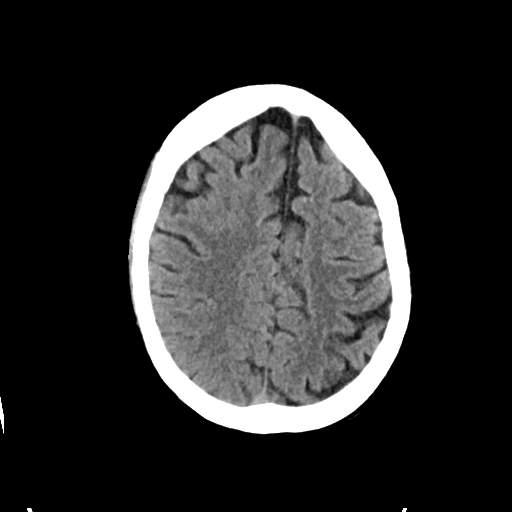

[Series 5: head wo true ax · axial · 0.35mm/px · z∈[-74,-25]mm · 2 of 32 slices shown]
[im 11/32  brain]
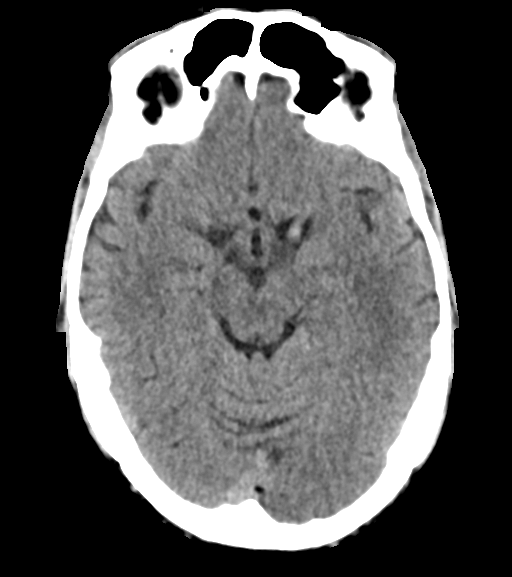
[im 21/32  brain]
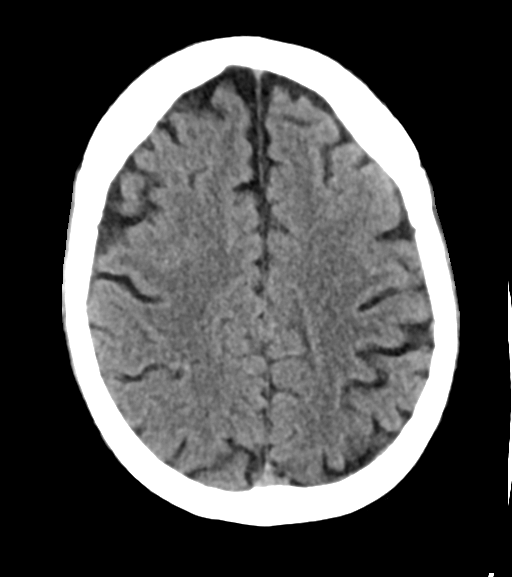

[Series 7: cor soft · coronal · 0.31mm/px · 3 of 68 slices shown]
[im 23/68  brain]
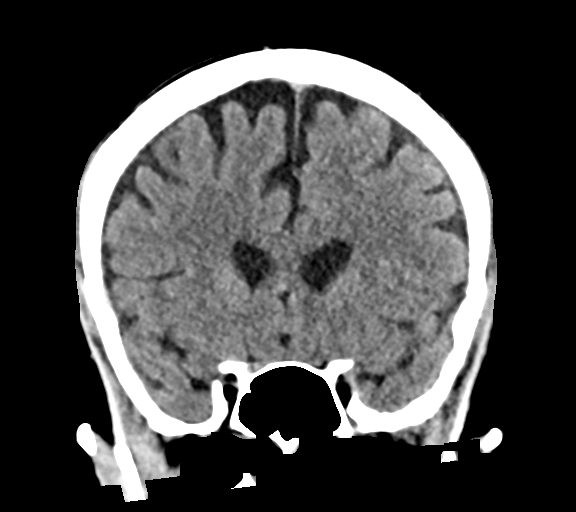
[im 30/68  brain]
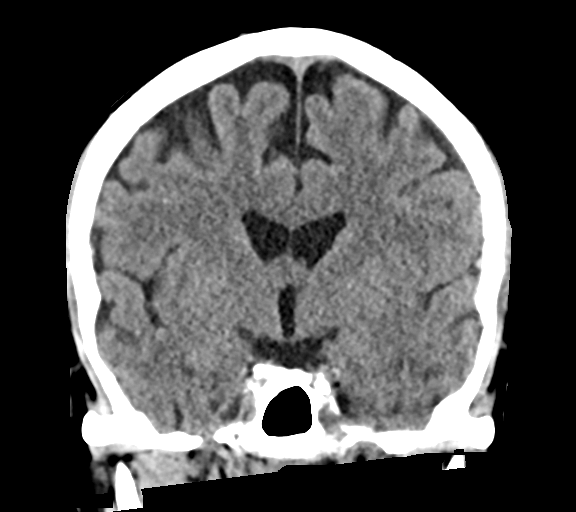
[im 38/68  brain]
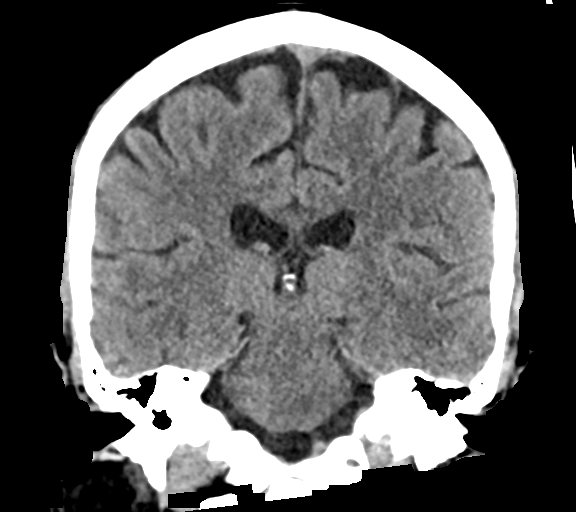

[Series 8: sag soft · sagittal · 0.31mm/px · 3 of 61 slices shown]
[im 22/61  brain]
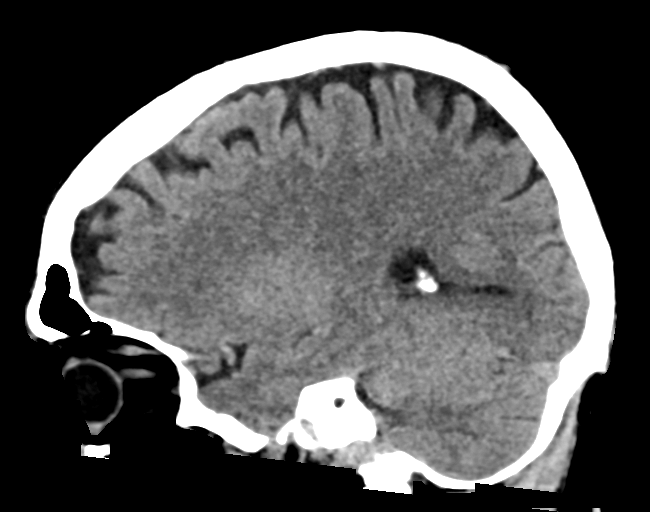
[im 31/61  brain]
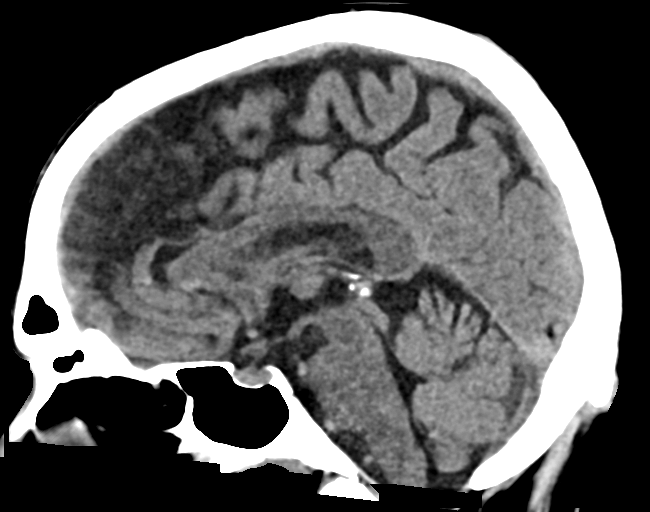
[im 40/61  brain]
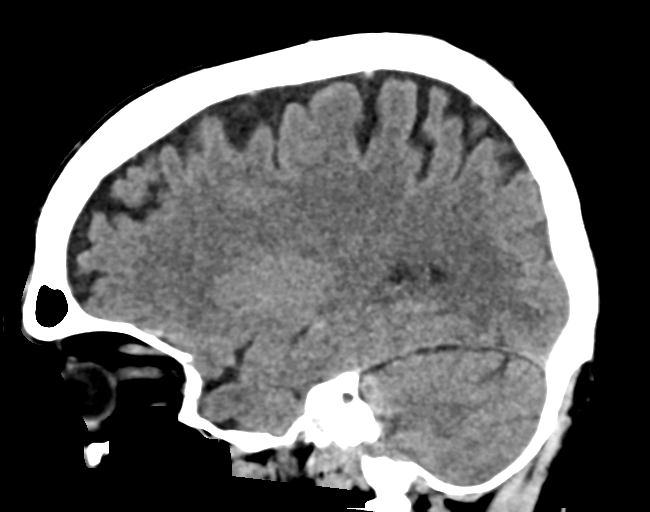

[17 of 47 positions shown; findings below may reference images not displayed]

FINDINGS: Brain: No evidence of acute infarction, hemorrhage, hydrocephalus,
extra-axial collection or mass lesion/mass effect.

Vascular: No hyperdense vessel or unexpected calcification.

Skull: Normal. Negative for fracture or focal lesion.

Sinuses/Orbits: No acute finding.
IMPRESSION: Negative head CT.

## 2021-07-07 MED ORDER — LAMOTRIGINE 200 MG PO TABS: 200.0000 mg | ORAL_TABLET | Freq: Two times a day (BID) | ORAL | 1 refills | Status: DC

## 2021-07-07 NOTE — Evaluation (Signed)
Occupational Therapy Evaluation ?Patient Details ?Name: Matthew [name] ?MRN: [id number] ?DOB: [birth year] ?Today's Date: 07/07/2021 ? ? ?History of Present Illness The patient is a 66 year old male with a PMHx of colonic AVM, bipolar I disorder, HTN, migraines, OSA, one episode of speech deficit with seizure-like semioloogy diagnosed as possibly being secondary to partial complex seizure (episode occurred in January of 2022), panic attacks and prior substance abuse admitted with acute onset of garbled speech.  ? ?Clinical Impression ?  ?PTA pt lives independently with his wife, drives and enjoys fly fishing. Pt is emotional at times during session. Pt demonstrates impaired attention and, STM deficits and demonstrates word finding deficits at times. Daughter states her Dad has improved but is not at his baseline. Long discussion with daughter regarding post ictal confusion. Daughter/pt verbalized understanding. No OT follow up recommended. If pt continues to have cognitive deficits at his next neruo follow up visit, he may benefit from a neuropsych eval.  Pt/daughter very appreciative.  ?   ? ?Recommendations for follow up therapy are one component of a multi-disciplinary discharge planning process, led by the attending physician.  Recommendations may be updated based on patient status, additional functional criteria and insurance authorization.  ? ?Follow Up Recommendations ? No OT follow up (Refrain from driving)  ?  ?Assistance Recommended at Discharge Frequent or constant Supervision/Assistance  ?Patient can return home with the following A little help with walking and/or transfers;A little help with bathing/dressing/bathroom;Assistance with cooking/housework;Direct supervision/assist for medications management;Direct supervision/assist for financial management;Assist for transportation;Help with stairs or ramp for entrance ? ?  ?Functional Status Assessment ?    ?Equipment Recommendations ? None recommended by OT   ?  ?Recommendations for Other Services PT consult ? ? ?  ?Precautions / Restrictions Precautions ?Precautions: Fall ?Restrictions ?Weight Bearing Restrictions: No  ? ?  ? ?Mobility Bed Mobility ?  ?Bed Mobility: Supine to Sit ?  ?  ?Supine to sit: Modified independent (Device/Increase time) ?  ?  ?  ?  ? ?Transfers ?Overall transfer level: Needs assistance ?Equipment used: Rolling walker (2 wheels) ?Transfers: Sit to/from Stand ?Sit to Stand: Supervision ?  ?  ?  ?  ?  ?General transfer comment: fell posteriorily onto bed initially ?  ? ?  ?Balance Overall balance assessment: Needs assistance ?  ?Sitting balance-Leahy Scale: Good ?Sitting balance - Comments: Pt was able to sit independently. ?  ?  ?Standing balance-Leahy Scale: Fair ?  ?  ?  ?  ?  ?  ?  ?  ?  ?  ?  ?  ?   ? ?ADL either performed or assessed with clinical judgement  ? ?ADL Overall ADL's : Needs assistance/impaired ?  ?  ?  ?  ?  ?  ?  ?  ?  ?  ?  ?  ?  ?  ?  ?  ?  ?  ?Functional mobility during ADLs: Min guard ?General ADL Comments: Minguard for LB B/D and set up/S wtih UB ADL. Long disucssion with pt/daughter regarding home saefty and reducing risk of falls. Pt will need direct S with bathing. Encourgaed use of built in shower seat to reduce risk of falls. Educated on need of direct S for all medicaiton adn Doctor, hospital. Pt aware he is not allowed to drive at this time.  ? ? ? ?Vision Baseline Vision/History: 1 Wears glasses ?Additional Comments: no visual deficits noted  ?   ?Perception   ?  ?Praxis   ?  ? ?  Pertinent Vitals/Pain    ? ? ? ?Hand Dominance Right ?  ?Extremity/Trunk Assessment Upper Extremity Assessment ?Upper Extremity Assessment: Overall WFL for tasks assessed ?  ?Lower Extremity Assessment ?Lower Extremity Assessment: Defer to PT evaluation ?  ?Cervical / Trunk Assessment ?Cervical / Trunk Assessment: Normal ?  ?Communication Communication ?Communication: Expressive difficulties (intermittent difficulty with using wrong  word) ?  ?Cognition Arousal/Alertness: Awake/alert ?Behavior During Therapy: [facility] for tasks assessed/performed ?Overall Cognitive Status: Impaired/Different from baseline ?Area of Impairment: Attention, Memory, Safety/judgement ?  ?  ?  ?  ?  ?  ?  ?  ?  ?Current Attention Level: Selective ?Memory: Decreased short-term memory ?  ?Safety/Judgement: Decreased awareness of safety ?  ?  ?General Comments: Scored a 4 onthe Short Blessed Test, indicating difficultywith memory. Noted pt having diffiuclty with appropriate use of wrods at times. Daughter present and stated her Dad is much improved from yesterday when he did not recognize pics of his grandchhildren and then thought they were toddlers insted of 47 adn 66 yrs old. She said he "sis not make sense wehn he was talking" ?  ?  ?General Comments  Daughter was in the room. Pt was educated on fall risk prevention including footware, lighting, shower set up, and clearing paths around the house. He was educated on how and when to use the walker. ? ?  ?Exercises   ?  ?Shoulder Instructions    ? ? ?Home Living Family/patient expects to be discharged to:: Private residence ?Living Arrangements: Spouse/significant other ?Available Help at Discharge: Family;Available PRN/intermittently ?Type of Home: House ?Home Access: Level entry ?  ?  ?Home Layout: Two level;Able to live on main level with bedroom/bathroom ?Alternate Level Stairs-Number of Steps: has been staying on main level recently ?  ?Bathroom Shower/Tub: Walk-in shower ?  ?Bathroom Toilet: Standard ?  ?  ?Home Equipment: Shower seat - built in;Grab bars - tub/shower ?  ?Additional Comments: Enjoys fly fishing, banjo and guitar playing ? Lives With: Spouse ? ?  ?Prior Functioning/Environment Prior Level of Function : Independent/Modified Independent ?  ?  ?  ?  ?  ?  ?  ?  ?  ? ?  ?  ?OT Problem List: Decreased cognition;Decreased safety awareness ?  ?   ?OT Treatment/Interventions:    ?  ?OT Goals(Current goals can be  found in the care plan section) Acute Rehab OT Goals ?Patient Stated Goal: to go home today ?OT Goal Formulation: All assessment and education complete, DC therapy  ?OT Frequency:   ?  ? ?Co-evaluation   ?  ?  ?  ?  ? ?  ?AM-PAC OT "6 Clicks" Daily Activity     ?Outcome Measure Help from another person eating meals?: None ?Help from another person taking care of personal grooming?: A Little ?Help from another person toileting, which includes using toliet, bedpan, or urinal?: A Little ?Help from another person bathing (including washing, rinsing, drying)?: A Little ?Help from another person to put on and taking off regular upper body clothing?: A Little ?Help from another person to put on and taking off regular lower body clothing?: A Little ?6 Click Score: 19 ?  ?End of Session Equipment Utilized During Treatment: Gait belt ?Nurse Communication: Mobility status ? ?Activity Tolerance: Patient tolerated treatment well ?Patient left: in chair;with call bell/phone within reach;with chair alarm set;with family/visitor present ? ?OT Visit Diagnosis: Unsteadiness on feet (R26.81);Other symptoms and signs involving cognitive function  ?              ?  Time: [id number] ?OT Time Calculation (min): 45 min ?Charges:  OT General Charges ?$OT Visit: 1 Visit ?OT Evaluation ?$OT Eval Moderate Complexity: 1 Mod ?OT Treatments ?$Self Care/Home Management : 8-22 mins ?$Therapeutic Activity: 8-22 mins ? ?[facility], OT/L  ? ?Acute OT Clinical Specialist ?Acute Rehabilitation Services ?Pager [phone] ?Office [phone]  ? ?Alexiah Koroma,HILLARY ?07/07/2021, 1:56 PM ?

## 2021-07-07 NOTE — Progress Notes (Addendum)
Asked to contact On call Neuro provider d/t change in patient's neuro assessment.  ?MD Thomasena Edis called and secure chatted by this RN. Notified Bedside RN.  ?

## 2021-07-07 NOTE — Progress Notes (Signed)
RN walked into patient's room with the bed alarm going off. Pt was confused, and had removed clothes and monitors. Disoriented x4. Paged Neuro. Ordered to take pt for STAT head CT. When down in CT, patient informed RN that the pt has a history of sleepwalking.  ?

## 2021-07-07 NOTE — Discharge Summary (Addendum)
Stroke Discharge Summary  Patient ID: Matthew [name]   MRN: [id number]      DOB: 1955-08-15  Date of Admission: 07/05/2021 Date of Discharge: 07/07/2021  Attending Physician:  Stroke, Md, MD, Stroke MD Consultant(s):   Treatment Team:  Gwinda Maine, MD None  [name] PCP:  Kristen Loader, FNP  DISCHARGE DIAGNOSIS: seizure episode mimicking stroke Principal Problem:   Seizure   Hx of seizure   HTN   Hx of migraine   Bipolar disorder   Allergies as of 07/07/2021       Reactions   Sumatriptan Other (See Comments)   ( Imitrex)- dizziness   Tramadol    Severe dizziness        Medication List     STOP taking these medications    amLODipine-benazepril 5-20 MG capsule Commonly known as: LOTREL   naratriptan 2.5 MG tablet Commonly known as: AMERGE   QUEtiapine 400 MG tablet Commonly known as: SEROQUEL   topiramate 50 MG tablet Commonly known as: TOPAMAX       TAKE these medications    amLODipine 5 MG tablet Commonly known as: NORVASC Take 5 mg by mouth daily.   benazepril 20 MG tablet Commonly known as: LOTENSIN Take 20 mg by mouth daily.   carvedilol 25 MG tablet Commonly known as: COREG TAKE 1 TABLET BY MOUTH TWICE DAILY WITH A MEAL   lamoTRIgine 200 MG tablet Commonly known as: LAMICTAL Take 1 tablet (200 mg total) by mouth 2 (two) times daily. What changed:  medication strength how much to take   lithium carbonate 300 MG capsule Take 300 mg by mouth every morning.   lithium carbonate 300 MG capsule Take 600 mg by mouth at bedtime.   rOPINIRole 1 MG tablet Commonly known as: REQUIP Take 1 mg by mouth at bedtime.   zaleplon 10 MG capsule Commonly known as: SONATA Take 10 mg by mouth at bedtime as needed for sleep.        LABORATORY STUDIES CBC    Component Value Date/Time   WBC 11.7 (H) 07/07/2021 0228   RBC 4.44 07/07/2021 0228   HGB 13.9 07/07/2021 0228   HCT 40.4 07/07/2021 0228   PLT 241 07/07/2021 0228   MCV  91.0 07/07/2021 0228   MCH 31.3 07/07/2021 0228   MCHC 34.4 07/07/2021 0228   RDW 12.5 07/07/2021 0228   LYMPHSABS 1.0 07/05/2021 1732   MONOABS 0.4 07/05/2021 1732   EOSABS 0.1 07/05/2021 1732   BASOSABS 0.1 07/05/2021 1732   CMP    Component Value Date/Time   NA 138 07/07/2021 0228   NA 141 02/10/2021 1040   K 3.8 07/07/2021 0228   CL 108 07/07/2021 0228   CO2 23 07/07/2021 0228   GLUCOSE 104 (H) 07/07/2021 0228   GLUCOSE 144 (H) 05/11/2006 0818   BUN 20 07/07/2021 0228   BUN 13 02/10/2021 1040   CREATININE 1.01 07/07/2021 0228   CALCIUM 9.6 07/07/2021 0228   PROT 7.4 07/05/2021 1732   PROT 6.3 02/10/2021 1040   ALBUMIN 4.6 07/05/2021 1732   ALBUMIN 4.5 02/10/2021 1040   AST 15 07/05/2021 1732   ALT 14 07/05/2021 1732   ALKPHOS 51 07/05/2021 1732   BILITOT 1.1 07/05/2021 1732   BILITOT 0.4 02/10/2021 1040   GFRNONAA >60 07/07/2021 0228   GFRAA  06/09/2009 1707    >60        The eGFR has been calculated using the MDRD equation. This calculation  has not been validated in all clinical situations. eGFR's persistently <60 mL/min signify possible Chronic Kidney Disease.   COAGS Lab Results  Component Value Date   INR 1.1 07/05/2021   INR 1.03 06/09/2009   Lipid Panel    Component Value Date/Time   CHOL 122 07/07/2021 0228   TRIG 67 07/07/2021 0228   HDL 34 (L) 07/07/2021 0228   CHOLHDL 3.6 07/07/2021 0228   VLDL 13 07/07/2021 0228   LDLCALC 75 07/07/2021 0228   HgbA1C  Lab Results  Component Value Date   HGBA1C 4.6 (L) 07/07/2021   Urinalysis    Component Value Date/Time   COLORURINE YELLOW 07/05/2021 1855   APPEARANCEUR CLEAR 07/05/2021 1855   LABSPEC 1.020 07/05/2021 1855   PHURINE 7.0 07/05/2021 1855   GLUCOSEU NEGATIVE 07/05/2021 1855   GLUCOSEU NEGATIVE 10/24/2010 0817   HGBUR TRACE (A) 07/05/2021 1855   BILIRUBINUR NEGATIVE 07/05/2021 1855   KETONESUR 15 (A) 07/05/2021 1855   PROTEINUR NEGATIVE 07/05/2021 1855   UROBILINOGEN 0.2  10/24/2010 0817   NITRITE NEGATIVE 07/05/2021 1855   LEUKOCYTESUR NEGATIVE 07/05/2021 1855   Urine Drug Screen     Component Value Date/Time   LABOPIA NONE DETECTED 07/05/2021 1855   COCAINSCRNUR NONE DETECTED 07/05/2021 1855   COCAINSCRNUR TEST NOT PERFORMED 10/24/2010 0817   LABBENZ NONE DETECTED 07/05/2021 1855   LABBENZ TEST NOT PERFORMED 10/24/2010 0817   AMPHETMU NONE DETECTED 07/05/2021 1855   THCU POSITIVE (A) 07/05/2021 1855   LABBARB NONE DETECTED 07/05/2021 1855    Alcohol Level    Component Value Date/Time   ETH <10 07/05/2021 1732     SIGNIFICANT DIAGNOSTIC STUDIES CT HEAD WO CONTRAST (5MM)  Result Date: 07/07/2021 CLINICAL DATA:  Mental status change with unknown cause EXAM: CT HEAD WITHOUT CONTRAST TECHNIQUE: Contiguous axial images were obtained from the base of the skull through the vertex without intravenous contrast. RADIATION DOSE REDUCTION: This exam was performed according to the departmental dose-optimization program which includes automated exposure control, adjustment of the mA and/or kV according to patient size and/or use of iterative reconstruction technique. COMPARISON:  Brain MRI from yesterday FINDINGS: Brain: No evidence of acute infarction, hemorrhage, hydrocephalus, extra-axial collection or mass lesion/mass effect. Vascular: No hyperdense vessel or unexpected calcification. Skull: Normal. Negative for fracture or focal lesion. Sinuses/Orbits: No acute finding. IMPRESSION: Negative head CT. Electronically Signed   By: Jorje Guild M.D.   On: 07/07/2021 06:31   MR BRAIN WO CONTRAST  Result Date: 07/06/2021 CLINICAL DATA:  Neuro deficit, acute, stroke suspected. Acute speech disturbance. Status post TNK. EXAM: MRI HEAD WITHOUT CONTRAST TECHNIQUE: Multiplanar, multiecho pulse sequences of the brain and surrounding structures were obtained without intravenous contrast. COMPARISON:  Head CT and CTA 07/05/2021 and MRI 05/05/2020 FINDINGS: The study is mildly  motion degraded. Brain: There is no evidence of an acute infarct, intracranial hemorrhage, mass, midline shift, or extra-axial fluid collection. The ventricles and sulci are within normal limits for age. No significant white matter disease is seen for age. Vascular: Major intracranial vascular flow voids are preserved. Skull and upper cervical spine: Unremarkable bone marrow signal. Sinuses/Orbits: Unremarkable orbits. Mild mucosal thickening in the paranasal sinuses. Clear mastoid air cells. Other: None. IMPRESSION: Unremarkable appearance of the brain for age. Electronically Signed   By: Logan Bores M.D.   On: 07/06/2021 16:45   EEG adult  Result Date: 07/06/2021 Lora Havens, MD     07/06/2021 10:15 AM Patient Name: Matthew [name] MRN: [id number] Epilepsy Attending: Marcelle Overlie  Barbra Sarks Referring Physician/Provider: Rosalin Hawking, MD Date: 07/06/2021 Duration: 22.25 mins Patient history:  66 y.o. male PMHx HTN, HLD, THC with acute embolic stroke s/p TNK now with residual neologistic paraphasia.  EEG to evaluate for seizure Level of alertness: Awake, asleep AEDs during EEG study: None Technical aspects: This EEG study was done with scalp electrodes positioned according to the 10-20 International system of electrode placement. Electrical activity was acquired at a sampling rate of 500Hz  and reviewed with a high frequency filter of 70Hz  and a low frequency filter of 1Hz . EEG data were recorded continuously and digitally stored. Description: The posterior dominant rhythm consists of 8 Hz activity of moderate voltage (25-35 uV) seen predominantly in posterior head regions, symmetric and reactive to eye opening and eye closing. Sleep was characterized by vertex waves, sleep spindles (12 to 14 Hz), maximal frontocentral region. Spikes were noted in left frontotemporal region, maximal F7/T7, predominantly during sleep.EEG also showed intermittent generalized and maximal left frontotemporal region 3 to 5 Hz theta-delta  slowing.  Hyperventilation and photic stimulation were not performed.   ABNORMALITY - Spike,left frontotemporal region, maximal F7/T7 - Intermittent slow, generalized and maximal left frontotemporal region IMPRESSION: This study showed evidence of epileptogenicity and cortical dysfunction arising from left frontotemporal region. Additionally there is mild diffuse encephalopathy, nonspecific etiology. No seizures were seen throughout the recording. Lora Havens   ECHOCARDIOGRAM COMPLETE  Result Date: 07/06/2021    ECHOCARDIOGRAM REPORT   Patient Name:   Matthew [name] Date of Exam: 07/06/2021 Medical Rec #:  [id number]         Height:       70.0 in Accession #:    [id number]        Weight:       158.5 lb Date of Birth:  1956-01-19         BSA:          1.891 m Patient Age:    48 years          BP:           135/81 mmHg Patient Gender: M                 HR:           49 bpm. Exam Location:  Inpatient Procedure: 2D Echo, Cardiac Doppler and Color Doppler Indications:    Stroke  History:        Patient has no prior history of Echocardiogram examinations.                 Risk Factors:Hypertension, Sleep Apnea and Former Smoker.  Sonographer:    Clayton Lefort RDCS (AE) Referring Phys: [id number] [facility]  Sonographer Comments: Suboptimal parasternal window. IMPRESSIONS  1. Left ventricular ejection fraction, by estimation, is 55 to 60%. The left ventricle has normal function. The left ventricle has no regional wall motion abnormalities. There is mild asymmetric left ventricular hypertrophy of the basal-septal segment. Left ventricular diastolic parameters are indeterminate.  2. Right ventricular systolic function is normal. The right ventricular size is normal.  3. Left atrial size was mild to moderately dilated.  4. The mitral valve is normal in structure. Trivial mitral valve regurgitation. No evidence of mitral stenosis.  5. The aortic valve was not well visualized. There is mild calcification of the aortic valve.  Aortic valve regurgitation is trivial. Aortic valve sclerosis is present, with no evidence of aortic valve stenosis.  6. The inferior vena cava is normal in size  with <50% respiratory variability, suggesting right atrial pressure of 8 mmHg. Comparison(s): No prior Echocardiogram. Conclusion(s)/Recommendation(s): Otherwise normal echocardiogram, with minor abnormalities described in the report. No intracardiac source of embolism detected on this transthoracic study. Consider a transesophageal echocardiogram to exclude cardiac source of embolism if clinically indicated. FINDINGS  Left Ventricle: Left ventricular ejection fraction, by estimation, is 55 to 60%. The left ventricle has normal function. The left ventricle has no regional wall motion abnormalities. The left ventricular internal cavity size was normal in size. There is  mild asymmetric left ventricular hypertrophy of the basal-septal segment. Left ventricular diastolic parameters are indeterminate. Right Ventricle: The right ventricular size is normal. No increase in right ventricular wall thickness. Right ventricular systolic function is normal. Left Atrium: Left atrial size was mild to moderately dilated. Right Atrium: Right atrial size was normal in size. Pericardium: There is no evidence of pericardial effusion. Mitral Valve: The mitral valve is normal in structure. Trivial mitral valve regurgitation. No evidence of mitral valve stenosis. MV peak gradient, 5.4 mmHg. The mean mitral valve gradient is 2.0 mmHg. Tricuspid Valve: The tricuspid valve is normal in structure. Tricuspid valve regurgitation is not demonstrated. No evidence of tricuspid stenosis. Aortic Valve: The aortic valve was not well visualized. There is mild calcification of the aortic valve. Aortic valve regurgitation is trivial. Aortic valve sclerosis is present, with no evidence of aortic valve stenosis. Aortic valve mean gradient measures 5.0 mmHg. Aortic valve peak gradient measures  9.4 mmHg. Aortic valve area, by VTI measures 4.35 cm. Pulmonic Valve: The pulmonic valve was not well visualized. Pulmonic valve regurgitation is not visualized. No evidence of pulmonic stenosis. Aorta: The aortic root, ascending aorta and aortic arch are all structurally normal, with no evidence of dilitation or obstruction. Venous: The inferior vena cava is normal in size with less than 50% respiratory variability, suggesting right atrial pressure of 8 mmHg. IAS/Shunts: The interatrial septum was not well visualized.  LEFT VENTRICLE PLAX 2D LVIDd:         3.80 cm   Diastology LVIDs:         2.70 cm   LV e' medial:    7.95 cm/s LV PW:         1.00 cm   LV E/e' medial:  10.6 LV IVS:        1.36 cm   LV e' lateral:   11.10 cm/s LVOT diam:     2.20 cm   LV E/e' lateral: 7.6 LV SV:         160 LV SV Index:   84 LVOT Area:     3.80 cm  RIGHT VENTRICLE             IVC RV Basal diam:  2.80 cm     IVC diam: 1.80 cm RV S prime:     20.20 cm/s TAPSE (M-mode): 2.7 cm LEFT ATRIUM             Index        RIGHT ATRIUM           Index LA diam:        4.20 cm 2.22 cm/m   RA Area:     13.50 cm LA Vol (A2C):   82.9 ml 43.84 ml/m  RA Volume:   32.30 ml  17.08 ml/m LA Vol (A4C):   52.3 ml 27.66 ml/m LA Biplane Vol: 67.8 ml 35.86 ml/m  AORTIC VALVE AV Area (Vmax):    4.15 cm AV Area (  Vmean):   4.02 cm AV Area (VTI):     4.35 cm AV Vmax:           153.00 cm/s AV Vmean:          105.000 cm/s AV VTI:            0.367 m AV Peak Grad:      9.4 mmHg AV Mean Grad:      5.0 mmHg LVOT Vmax:         167.00 cm/s LVOT Vmean:        111.000 cm/s LVOT VTI:          0.420 m LVOT/AV VTI ratio: 1.14  AORTA Ao Root diam: 3.40 cm Ao Asc diam:  3.10 cm MITRAL VALVE MV Area (PHT): 3.48 cm    SHUNTS MV Area VTI:   3.79 cm    Systemic VTI:  0.42 m MV Peak grad:  5.4 mmHg    Systemic Diam: 2.20 cm MV Mean grad:  2.0 mmHg MV Vmax:       1.16 m/s MV Vmean:      67.3 cm/s MV Decel Time: 218 msec MV E velocity: 84.40 cm/s MV A velocity: 90.30 cm/s  MV E/A ratio:  0.93 Buford Dresser MD Electronically signed by Buford Dresser MD Signature Date/Time: 07/06/2021/2:28:18 PM    Final    CT HEAD CODE STROKE WO CONTRAST  Result Date: 07/05/2021 CLINICAL DATA:  Code stroke. Neuro deficit, acute, stroke suspected. Confusion and speech disturbance. EXAM: CT HEAD WITHOUT CONTRAST TECHNIQUE: Contiguous axial images were obtained from the base of the skull through the vertex without intravenous contrast. RADIATION DOSE REDUCTION: This exam was performed according to the departmental dose-optimization program which includes automated exposure control, adjustment of the mA and/or kV according to patient size and/or use of iterative reconstruction technique. COMPARISON:  05/04/2020 FINDINGS: Brain: Normal appearance without evidence of old or acute infarction, mass lesion, hemorrhage, hydrocephalus or extra-axial collection. Vascular: Mild tortuosity of the vessels at the base of the brain. No acute vascular finding. Skull: Normal Sinuses/Orbits: Clear except for an opacified posterior ethmoid air cell on the right. Orbits negative. Other: None ASPECTS ([place]ore Stroke Program Early CT Score) - Ganglionic level infarction (caudate, lentiform nuclei, internal capsule, insula, M1-M3 cortex): 7 - Supraganglionic infarction (M4-M6 cortex): 3 Total score (0-10 with 10 being normal): 10 IMPRESSION: 1. Normal head CT.  No acute finding. 2. ASPECTS is 10 3. These results were called by telephone at the time of interpretation on 07/05/2021 at 6:02 pm to provider MELANIE BELFI , who verbally acknowledged these results. Electronically Signed   By: Nelson Chimes M.D.   On: 07/05/2021 18:03   CT ANGIO HEAD NECK W WO CM (CODE STROKE)  Result Date: 07/05/2021 CLINICAL DATA:  Neuro deficit, acute, stroke suspected. Confusion. Word finding difficulty. EXAM: CT ANGIOGRAPHY HEAD AND NECK TECHNIQUE: Multidetector CT imaging of the head and neck was performed using the standard  protocol during bolus administration of intravenous contrast. Multiplanar CT image reconstructions and MIPs were obtained to evaluate the vascular anatomy. Carotid stenosis measurements (when applicable) are obtained utilizing NASCET criteria, using the distal internal carotid diameter as the denominator. RADIATION DOSE REDUCTION: This exam was performed according to the departmental dose-optimization program which includes automated exposure control, adjustment of the mA and/or kV according to patient size and/or use of iterative reconstruction technique. CONTRAST:  47m OMNIPAQUE IOHEXOL 350 MG/ML SOLN COMPARISON:  Head CT earlier same day.  Brain MRI 05/05/2020. FINDINGS:  CTA NECK FINDINGS Aortic arch: Aortic atherosclerosis. No aneurysm or dissection. Branching pattern is normal without origin stenosis. Right carotid system: Common carotid artery widely patent to the bifurcation. Minimal atherosclerotic plaque at the carotid bifurcation but no stenosis. Cervical ICA widely patent. Left carotid system: Common carotid artery widely patent to the bifurcation. Calcified plaque at the carotid bifurcation and ICA bulb but no stenosis. Cervical ICA is tortuous but widely patent. Vertebral arteries: Both vertebral artery origins are widely patent. There is calcified plaque adjacent to the right vertebral artery origin but without stenosis greater than 30%. Skeleton: Ordinary cervical spondylosis.  Mild scoliotic curvature. Other neck: No mass or lymphadenopathy. Upper chest: Lung apices are clear. Review of the MIP images confirms the above findings CTA HEAD FINDINGS Anterior circulation: Both internal carotid arteries are patent through the skull base and siphon regions. There is ordinary siphon atherosclerotic calcification but no stenosis or aneurysm. Both posterior cerebral arteries receive the majority of there supply from the anterior circulation. Both middle cerebral arteries are widely patent. There is a MCA  trifurcation on the left with mild ectasia of the MCA at the trifurcation but no frank aneurysm. No MCA large vessel occlusion on either side. There is an azygous anterior cerebral artery which is widely patent. Posterior circulation: Both vertebral arteries are patent through the foramen magnum. Both posteroinferior cerebellar arteries are patent. Both vertebral arteries reach the basilar. The basilar is small as there is fetal origin of both posterior cerebral arteries. No choir posterior circulation disease is identified. Venous sinuses: Patent and normal. Anatomic variants: None other significant. Review of the MIP images confirms the above findings IMPRESSION: No acute intracranial large vessel occlusion. Mild atherosclerotic change at the carotid bifurcation regions but without stenosis or significant irregularity. Ordinary siphon atherosclerotic disease but without stenosis greater than 30%. Several anatomic variants, including fetal origin of both posterior cerebral arteries from the anterior circulation, as a gas anterior cerebral artery, and trifurcation of the left middle cerebral artery with mild ectasia at the trifurcation point but no frank aneurysm. Electronically Signed   By: Nelson Chimes M.D.   On: 07/05/2021 18:54      HISTORY OF PRESENT ILLNESS Patient with a history of bipolar disorder, HTN, seizures, migraines, OSA and substance abuse was admitted with confusion and difficulty speaking.   HOSPITAL COURSE Upon admission, patient was evaluated for stroke and given TNK to treat stroke symptoms.  However, MRI revelaed that patient had not had a stroke, and EEG revealed eleptogenicity in left frontotemporal region. [name] Lamictal was increased to treat seizure disorder, and he was advised not to drive until seizure-free for 6 months and he has been cleared by his neurologist.  Most likely seizure episode Stroke like symptoms s/p TNKase History and current episode concerning for  seizure Code Stroke CT head No acute abnormality. CTA head & neck no acute LVO MRI no acute infarct 2D Echo EF is 55-60%.  Left atrial size mild to moderately dilated, trivial mitral valve regurgitation, mild calcification of the aortic valve, aortic valve regurgitation is trivial EEG- epileptogenicity and cortical dysfunction arising from left frontotemporal region. Additionally there is mild diffuse encephalopathy, nonspecific etiology. No seizures were seen throughout the recording. LDL 75 HgbA1c 4.6 VTE prophylaxis - SCDs No antithrombotic prior to admission, now not on antithrombotics within 24 hours s/p TNKase and also no clear indication.  Was on Lamictal 150 mg twice daily PTA, increased Lamictal to 200 mg twice daily for seizure control. Disposition:  recommend  no driving until seizure-free for 6 months and under physician's care. Pt is aware   History of seizure 05/2020 patient admitted for stereotypical episodes of staring off, strange coughing/breathing sound, not responding.  EEG normal, considered seizure episodes.  Patient missed Lamictal for some time for bipolar disorder, restarted on Lamictal 200 mg nightly on discharge. Follow with [facility] over the last year, continue Lamictal 200 mg nightly and tapered off Topamax for migraine.   Hypertension Home meds:  benazepril 39m, norvasc 181mStable Long-term BP goal normotensive   Other Stroke Risk Factors Advanced Age >/= [age]Migraines Was on topamax-recently tapered off by neurology due to excessive weight loss and memory difficulties   Other Active Problems Bipolar disorder Lamictal on 150 mg twice daily PTA Stable, on lithium, medication resumed Followed by psychiatry outpatient Delirium   Overnight delirium -> CT head negative -> at baseline in am  DISCHARGE EXAM Blood pressure (!) 151/91, pulse 62, temperature 98.2 F (36.8 C), temperature source Oral, resp. rate 11, height 5' 10"  (1.778 m), weight 71.9 kg, SpO2 97  %. General:  Alert, well-developed patient in no acute distress  NEURO:  Mental Status: AA&Ox3  Speech/Language: speech is without dysarthria or aphasia.  Fluency, and comprehension intact.  Cranial Nerves:  II: PERRL.  III, IV, VI: EOMI. Eyelids elevate symmetrically.   VII: Smile is symmetrical. VIII: hearing intact to voice. IX, X:  Phonation is normal.  XII: tongue is midline without fasciculations. Motor: 5/5 strength to all muscle groups tested.    Discharge Diet       Diet   Diet Heart Room service appropriate? Yes; Fluid consistency: Thin   liquids  DISCHARGE PLAN Disposition:  to home No antithrombotic for secondary stroke prevention as episode was caused by seizure activity Ongoing stroke risk factor control by Primary Care Physician at time of discharge Follow-up PCP BrKristen LoaderFNP in 2 weeks. [facility] with McCue NP on 08/22/21.   35 minutes were spent preparing discharge.  CoCowlic MSN, AGACNP-BC [facility] See Amion for schedule and pager information 07/07/2021 2:36 PM    ATTENDING NOTE: I reviewed above note and agree with the assessment and plan. Pt was seen and examined.   Daughter at the bedside. Pt sitting in bed, AAO X2 and at baseline. Neuro exam no focal deficit. Overnight pt had confusion and delirium CT repeat negative, considered in hospital delirium. Continue lamictal 20024mid for seizure control. No driving until seizure free for 6 months and under physician's care. Pt will be discharged today and [facility]   For detailed assessment and plan, please refer to above as I have made changes wherever appropriate.   JinRosalin HawkingD PhD Stroke Neurology 07/07/2021 2:50 PM

## 2021-07-07 NOTE — Discharge Instructions (Addendum)
[name], you were admitted with confusion and difficulty speaking.  You were treated for a possible stroke with TNK, but your MRI revealed that you did not have a stroke.  Your EEG showed evidence of possible seizure activity, and your original symptoms were most likely due to a seizure.   Your Lamictal was increased to treat this.  You are now being discharged home.  You must not drive until you are seizure-free for 6 months and have been cleared by your neurologist. ?

## 2021-07-07 NOTE — Evaluation (Signed)
Speech Language Pathology Evaluation ?Patient Details ?Name: Matthew [name] ?MRN: [id number] ?DOB: 1956-02-28 ?Today's Date: 07/07/2021 ?Time: 4163-8453 ?SLP Time Calculation (min) (ACUTE ONLY): 30 min ? ?Problem List:  ?Patient Active Problem List  ? Diagnosis Date Noted  ? CVA (cerebral vascular accident) (HCC) 07/05/2021  ? AVM (arteriovenous malformation) of colon 02/04/2021  ? Fall 05/04/2020  ? GERD 09/01/2009  ? HEMATOCHEZIA 06/08/2009  ? ARM PAIN 12/24/2008  ? DYSESTHESIA 12/24/2008  ? ABDOMINAL PAIN 01/31/2008  ? ANXIETY 04/22/2007  ? PANIC ATTACK 04/22/2007  ? VERTIGO 04/22/2007  ? CHEST PAIN, UNSPECIFIED 04/22/2007  ? Bipolar I disorder (HCC) 02/21/2007  ? MIGRAINE VARIANTS, W/INTRACTABLE MIGRAINE 02/21/2007  ? NAUSEA WITH VOMITING 02/21/2007  ? U R I 02/18/2007  ? CONSTIPATION 02/18/2007  ? Essential hypertension 02/16/2007  ? ?Past Medical History:  ?Past Medical History:  ?Diagnosis Date  ? Anxiety   ? on meds  ? AVM (arteriovenous malformation) of colon 02/04/2021  ? Bipolar I disorder, most recent episode (or current) mixed, moderate   ? GERD (gastroesophageal reflux disease)   ? Hypertension   ? on meds  ? Migraines   ? OSA (obstructive sleep apnea)   ? retested-bipap not needed  ? Panic attacks   ? Seizures (HCC)   ? last known 05/2020;  ? Substance abuse (HCC)   ? ?Past Surgical History:  ?Past Surgical History:  ?Procedure Laterality Date  ? CARPOMETACARPEL (CMC) FUSION OF THUMB WITH AUTOGRAFT FROM RADIUS Bilateral   ? COLONOSCOPY  2011  ? [facility]-in pt at [facility], [facility], [facility]-  ? FINGER ARTHROPLASTY Left 06/17/2012  ? Procedure: FINGER ARTHROPLASTY;  Surgeon: Jodi Marble, MD;  Location: [facility];  Service: Orthopedics;  Laterality: Left;  left thumb suspension arthroplasty   ? HEMORRHOID SURGERY  2000  ? Dr Earlene Plater  ? TONSILLECTOMY    ? WISDOM TOOTH EXTRACTION    ? ?HPI:  ?Pt is a 66 year old male who presented with aphasia. CTA was negative for LVO, CT head negative, TNK given. EEG 3/8:  epileptogenicity and cortical dysfunction arising from left frontotemporal region, mild diffuse encephalopathy. MRI not completed at time of eval. PMH: colonic AVM, bipolar I disorder, HTN, migraines, OSA, one episode of speech deficit with seizure-like semiology diagnosed as possibly being secondary to partial complex seizure (episode occurred in January of 2022), panic attacks and prior substance abuse.  ? ?Assessment / Plan / Recommendation ?Clinical Impression ? The patient was seen for cognitive-linguistic evaluation of skills. Pt's sister and daughter present throughout the entirety of the session. All parties reported that the pt did not have any deficits in speech or cognition prior to admission. Pt verbalized having difficulties with memory since admission, however he reported that he is slowly getting back to prior level of functioning. SLP administered SLUMS; pt scored 20/30, indicating a mild cognitive impairment. Areas of deficit included decreased long and short-term memory recall and mathematical problem-solving. Noted that the pt could not remember if he finished college or if he had joint/separate banking with his wife. Pt verbalized he receives support from his wife for medication management.  SLP educated family members on assisting with medication and finances, as well as integrating external memory aids (i.e., planner/calendar/note pad). Pt and family verbalized understanding of level of support necessary for the pt at d/c. SLP to sign off as pt is returning to baseline and education has been completed. ?   ?SLP Assessment ? SLP Recommendation/Assessment: Patient does not need any further Speech Lanaguage  Pathology Services ?SLP Visit Diagnosis: Cognitive communication deficit (R41.841)  ?  ?Recommendations for follow up therapy are one component of a multi-disciplinary discharge planning process, led by the attending physician.  Recommendations may be updated based on patient status, additional  functional criteria and insurance authorization. ?   ?Follow Up Recommendations ? No SLP follow up  ?  ?Assistance Recommended at Discharge ? PRN  ?Functional Status Assessment Patient has had a recent decline in their functional status and demonstrates the ability to make significant improvements in function in a reasonable and predictable amount of time.  ?Frequency and Duration    ?  ?  ?   ?SLP Evaluation ?Cognition ? Overall Cognitive Status: Impaired/Different from baseline ?Arousal/Alertness: Awake/alert ?Year: 2023 ?Day of Week: Correct ?Attention: Focused ?Focused Attention: Appears intact ?Memory: Impaired ?Memory Impairment: Storage deficit;Decreased recall of new information;Decreased long term memory ?Decreased Long Term Memory: Verbal basic ?Awareness: Appears intact ?Problem Solving: Appears intact ?Safety/Judgment: Appears intact  ?  ?   ?Comprehension ? Auditory Comprehension ?Overall Auditory Comprehension: Appears within functional limits for tasks assessed ?Yes/No Questions: Not tested ?Commands: Not tested ?Conversation: Simple ?Visual Recognition/Discrimination ?Discrimination: Not tested ?Reading Comprehension ?Reading Status: Not tested  ?  ?Expression Expression ?Primary Mode of Expression: Verbal ?Verbal Expression ?Overall Verbal Expression: Appears within functional limits for tasks assessed ?Initiation: No impairment ?Automatic Speech: Name;Social Response ?Level of Generative/Spontaneous Verbalization: Conversation ?Naming: No impairment ?Pragmatics: No impairment ?Effective Techniques: Open ended questions ?Non-Verbal Means of Communication: Not applicable ?Written Expression ?Dominant Hand: Right ?Written Expression: Not tested   ?Oral / Motor ? Oral Motor/Sensory Function ?Overall Oral Motor/Sensory Function: Within functional limits ?Motor Speech ?Overall Motor Speech: Appears within functional limits for tasks assessed ?Respiration: Within functional limits ?Phonation:  Normal ?Resonance: Within functional limits ?Articulation: Within functional limitis ?Intelligibility: Intelligible ?Motor Planning: Witnin functional limits ?Motor Speech Errors: Not applicable   ?        ? ?Ezekiel Slocumb ?07/07/2021, 12:08 PM ? ?

## 2021-07-07 NOTE — Progress Notes (Addendum)
Physical Therapy Treatment ?Patient Details ?Name: Matthew [name] ?MRN: [id number] ?DOB: Jan 08, 1956 ?Today's Date: 07/07/2021 ? ? ?History of Present Illness The patient is a 66 year old male with a PMHx of colonic AVM, bipolar I disorder, HTN, migraines, OSA, one episode of speech deficit with seizure-like semioloogy diagnosed as possibly being secondary to partial complex seizure (episode occurred in January of 2022), panic attacks and prior substance abuse admitted with acute onset of garbled speech. ? ?  ?PT Comments  ? ? Pt was able to transfer out of the chair with supervision. Pt was able to perform DGI testing with a score of 18/24. Scores less than 19/24 indicate mild fall risk impairment. Recommending rolling walker to assist with balance. Pt was educated on proper use of the walker and demonstrated understanding. He was educated on fall risk prevention around the house and was given a handout. Not recommending PT follow-up post acute due to feel he will continue to progress as seizure deficits resolve. Will continue to follow acutely to maximize functional mobility, independence and safety. ?  ?Recommendations for follow up therapy are one component of a multi-disciplinary discharge planning process, led by the attending physician.  Recommendations may be updated based on patient status, additional functional criteria and insurance authorization. ? ?Follow Up Recommendations ? No PT follow up ?  ?  ?Assistance Recommended at Discharge Set up Supervision/Assistance  ?Patient can return home with the following Direct supervision/assist for medications management;Assist for transportation;Help with stairs or ramp for entrance ?  ?Equipment Recommendations ? Rolling walker (2 wheels)  ?  ?Recommendations for Other Services   ? ? ?  ?Precautions / Restrictions Precautions ?Precautions: Fall ?Restrictions ?Weight Bearing Restrictions: No  ?  ? ?Mobility ? Bed Mobility ?  ?  ?  ?  ?  ?  ?  ?General bed mobility  comments: Pt was in recliner at the start of the session ?  ? ?Transfers ?  ?Equipment used: Rolling walker (2 wheels) ?Transfers: Sit to/from Stand ?Sit to Stand: Supervision ?  ?  ?  ?  ?  ?General transfer comment: Supervision for balance, safety and line management ?  ? ?Ambulation/Gait ?Ambulation/Gait assistance: Min guard ?Gait Distance (Feet): 200 Feet ?Assistive device: Rolling walker (2 wheels) ?Gait Pattern/deviations: Step-through pattern, Decreased stride length ?  ?  ?  ?General Gait Details: Pt was able to walk with a walker safely without loss of balance. Without walker pt was unsteady with during turns but was able to self correct. ? ? ?Stairs ?Stairs: Yes ?Stairs assistance: Min guard ?Stair Management: One rail Right, Step to pattern, Forwards ?Number of Stairs: 3 ?General stair comments: Min guard was provided for safety. ? ? ?Wheelchair Mobility ?  ? ?Modified Rankin (Stroke Patients Only) ?  ? ? ?  ?Balance Overall balance assessment: Needs assistance ?  ?Sitting balance-Leahy Scale: Good ?Sitting balance - Comments: Pt was able to sit independently. ?  ?  ?Standing balance-Leahy Scale: Good ?Standing balance comment: Pt is independent with static standing balance but required assistance with higher level balance activites. ?  ?  ?  ?  ?  ?  ?  ?High Level Balance Comments: Pt had unsteadiness with turning but was able to self correct. ?Standardized Balance Assessment ?Standardized Balance Assessment : Dynamic Gait Index ?  ?Dynamic Gait Index ?Level Surface: Normal ?Change in Gait Speed: Mild Impairment ?Gait with Horizontal Head Turns: Mild Impairment ?Gait with Vertical Head Turns: Normal ?Gait and Pivot Turn: Mild Impairment ?  Step Over Obstacle: Normal ?Step Around Obstacles: Mild Impairment ?Steps: Moderate Impairment ?Total Score: 18 ?  ? ?  ?Cognition Arousal/Alertness: Awake/alert ?Behavior During Therapy: [facility] for tasks assessed/performed ?  ?  ?  ?  ?  ?  ?  ?  ?  ?  ?  ?  ?  ?  ?  ?   ?  ?General Comments: Pt mixed up directions once during balance exercise but was able to correct. ?  ?  ? ?  ?Exercises   ? ?  ?General Comments General comments (skin integrity, edema, etc.): Daughter was in the room. Pt was educated on fall risk prevention including footware, lighting, shower set up, and clearing paths around the house. He was educated on how and when to use the walker. ?  ?  ? ?Pertinent Vitals/Pain    ? ? ?Home Living   ?  ?Available Help at Discharge: Family;Available PRN/intermittently ?Type of Home: House ?  ?  ?  ?  ?  ?  ?   ?  ?Prior Function    ?  ?  ?   ? ?PT Goals (current goals can now be found in the care plan section)   ? ?  ?Frequency ? ? ? Min 4X/week ? ? ? ?  ?PT Plan Current plan remains appropriate  ? ? ?Co-evaluation   ?  ?  ?  ?  ? ?  ?AM-PAC PT "6 Clicks" Mobility   ?Outcome Measure ? Help needed turning from your back to your side while in a flat bed without using bedrails?: None ?Help needed moving from lying on your back to sitting on the side of a flat bed without using bedrails?: None ?Help needed moving to and from a bed to a chair (including a wheelchair)?: A Little ?Help needed standing up from a chair using your arms (e.g., wheelchair or bedside chair)?: A Little ?Help needed to walk in hospital room?: A Little ?Help needed climbing 3-5 steps with a railing? : A Little ?6 Click Score: 20 ? ?  ?End of Session Equipment Utilized During Treatment: Gait belt ?Activity Tolerance: Patient tolerated treatment well ?Patient left: with call bell/phone within reach;with family/visitor present;in chair;with chair alarm set ?  ?PT Visit Diagnosis: Muscle weakness (generalized) (M62.81);Other abnormalities of gait and mobility (R26.89) ?  ? ? ?Time: [id number] ?PT Time Calculation (min) (ACUTE ONLY): 32 min ? ?Charges:  $Gait Training: 8-22 mins ?$Neuromuscular Re-education: 8-22 mins          ?          ? ?Quenton Fetter, SPT ? ? ? ?Quenton Fetter ?07/07/2021, 1:13 PM ? ?

## 2021-07-13 NOTE — Progress Notes (Signed)
?[facility] ?[id number] ?[place]. [place] [address] ?[phone] ? ?     OFFICE FLLOW UP VISIT NOTE ? ?Matthew [name] ?Date of Birth:  1955-05-19 ?Medical Record Number:  [id number]  ? ?Referring MD: Rebekah Chesterfield Pokhrel ? ?Reason for Referral: Seizure ? ?Chief Complaint  ?Patient presents with  ? Follow-up  ?  Rm 3 with spouse Matthew [name]  ?Pt is well, FU on recent sz. Been stable since d/c   ?  ? ?HPI:  ? ?Initial visit 07/08/2020 Dr. Pearlean Brownie: [name] is a 66 year old Caucasian male seen today for initial office consultation visit for seizure.  History is obtained from the patient and review of electronic medical records and I have personally reviewed pertinent imaging films in PACS.  He has a past medical history of anxiety and bipolar disorder, migraines, hypertension, panic attacks and substance abuse.  He was seen in the ER on 05/04/2020 when he developed an episode in which he stated he his eyes deviated tonically to the left and it was uncomfortable he tried to look back across and walk and stumbled and fell and had a severe concussion with frontal and periorbital swelling with brief loss of consciousness.  Some confusion after that but gradually improved by the time he came to the ER.  On inquiry he and his wife admitted he had had for the last few well several episodes in which he felt subjectively his eyes were looking in different directions and he could not control them.  During these episodes her wife noticed that he would stare into the distance and make abnormal breathing type sound.  Episodes of fairly stereotypical occurring several times a year.  He would occasionally rapidly blink during these episodes.  Patient has a history of bipolar disorder and was on lamotrigine and lithium and had apparently stopped this medication a month prior to the last episode in January.  Is lithium and lamotrigine levels were undetectable on 05/05/2020.  EEG was normal.  MRI scan of the brain showed  no acute brain abnormality.  There are mild changes of chronic small vessel disease.  Incidental 1.2 cm midline posterior nasopharynx cyst was noted.  Right frontoparietal and periorbital contusion was also noted.  Patient also has history of migraine headaches and he takes Topamax 200 mg daily for prophylaxis it seems to have worked quite well and migraines in the down to once every couple of months.  He was recently seen by psychiatrist to seem quite content with his current medication for bipolar and did not suggest any changes.  Patient has no new complaints today.  He has no prior history of seizures, strokes, TIAs, significant head injury with loss of consciousness except the most recent one.  No childhood history of febrile seizures family history of epilepsy. ?Update 10/28/2020 Dr. Pearlean Brownie; He returns for follow-up after last visit 3 months ago.  Is accompanied by his wife.  He is doing well and has not had any generalized seizures or staring episodes.  He remains on Lamictal 200 mg daily she is tolerating well without side effects.  He is also on Topamax 200 mg daily and his migraines are quite well controlled and occur only once every couple of months.  He is a decreased appetite and has been losing weight.  Patient continues to have mild short-term memory difficulties but these are not progressive.  He states his bipolar symptoms are quite stable and he has regular follow-up with his psychiatrist.  He had follow-up EEG  on 08/05/2020 which was also normal.  He has no new complaints today. ? ? ?Update 02/10/2021 JM: Returns for 20-month seizure follow-up.  Overall stable without any reoccurring seizure events.  Remains on lamotrigine (prior 200mg  daily but per pt, was changed to either 100mg  BID or 150mg  BID by Dr. - unable to verify this via epic).  No side effects.  Remains on topiramate 100 mg daily for migraine prophylaxis. No migraine occurrence since decreasing dosage from 200 mg to 100 mg and does  report excellent improvement of his memory - still not "perfect" but greatly improved.  Routinely followed by psychiatry and PCP.  No concerns today. ? ? ? ?Update 07/13/2021 JM: Patient being seen for recent hospital follow-up accompanied by his wife.  He presented to ED on 07/05/2021 with confusion and garbled speech and some aphasia and then separate episode shortly after consisting of staring off, not responding, and stumbled in hallway at home.  He received TNK due to stroke concern, MRI and CTA unremarkable.  EEG showed epileptogenicity and cortical dysfunction arising from left frontotemporal region.  Episode initially concerning for stroke but after work-up, more concerning for seizure therefore increased lamotrigine to 200 mg twice daily.  Topiramate discontinued.  He was discharged home on 3/9. ? ?Since discharge, he has been doing well. Does have some fatigue especially after increased exertion.  He has had no additional seizure activity and tolerating higher dose of lamotrigine without side effects. Wife mentions frequent panic attacks, closely followed by psychiatry. He will use clonazepam occasionally for panic attacks but does report increased fatigue after taking.  He has not had any migraines or headaches since discontinuing topiramate. ? ? ?EEG 07/06/2021- epileptogenicity and cortical dysfunction arising from left frontotemporal region. Additionally there is mild diffuse encephalopathy, nonspecific etiology. No seizures were seen throughout the recording. ? ? ? ? ? ? ?ROS:   ?14 system review of systems is positive for those listed in HPI and all other systems negative ? ?PMH:  ?Past Medical History:  ?Diagnosis Date  ? Anxiety   ? on meds  ? AVM (arteriovenous malformation) of colon 02/04/2021  ? Bipolar I disorder, most recent episode (or current) mixed, moderate   ? GERD (gastroesophageal reflux disease)   ? Hypertension   ? on meds  ? Migraines   ? OSA (obstructive sleep apnea)   ? retested-bipap  not needed  ? Panic attacks   ? Seizures (HCC)   ? last known 05/2020;  ? Substance abuse (HCC)   ? ? ?Social History:  ?Social History  ? ?Socioeconomic History  ? Marital status: Married  ?  Spouse name: susan  ? Number of children: Not on file  ? Years of education: Not on file  ? Highest education level: Not on file  ?Occupational History  ? Occupation: Disability  ?  Comment: Bipolar disorder  ?Tobacco Use  ? Smoking status: Former  ?  Types: Pipe, Cigars  ? Smokeless tobacco: Never  ?Vaping Use  ? Vaping Use: Never used  ?Substance and Sexual Activity  ? Alcohol use: No  ? Drug use: No  ?  Types: Cocaine  ?  Comment: past history of use-  ? Sexual activity: Not on file  ?Other Topics Concern  ? Not on file  ?Social History Narrative  ? Lives with wife  ? Right Handed  ? Drinks 3-4 cups caffeine daily  ?   ? ?Social Determinants of Health  ? ?Financial Resource Strain: Not on  file  ?Food Insecurity: Not on file  ?Transportation Needs: Not on file  ?Physical Activity: Not on file  ?Stress: Not on file  ?Social Connections: Not on file  ?Intimate Partner Violence: Not on file  ? ? ?Medications:   ?Current Outpatient Medications on File Prior to Visit  ?Medication Sig Dispense Refill  ? amLODipine (NORVASC) 5 MG tablet Take 5 mg by mouth daily.    ? benazepril (LOTENSIN) 20 MG tablet Take 20 mg by mouth daily.    ? carvedilol (COREG) 25 MG tablet TAKE 1 TABLET BY MOUTH TWICE DAILY WITH A MEAL (Patient taking differently: Take 25 mg by mouth 2 (two) times daily with a meal.) 180 tablet 1  ? lamoTRIgine (LAMICTAL) 200 MG tablet Take 1 tablet (200 mg total) by mouth 2 (two) times daily. 60 tablet 1  ? lithium 300 MG capsule Take 300 mg by mouth every morning.    ? lithium carbonate 300 MG capsule Take 600 mg by mouth at bedtime.    ? rOPINIRole (REQUIP) 1 MG tablet Take 1 mg by mouth at bedtime.    ? zaleplon (SONATA) 10 MG capsule Take 10 mg by mouth at bedtime as needed for sleep.    ? ?No current  facility-administered medications on file prior to visit.  ? ? ?Allergies:   ?Allergies  ?Allergen Reactions  ? Sumatriptan Other (See Comments)  ?  ( Imitrex)- dizziness  ? Tramadol   ?  Severe dizziness  ? ? ?Physical E

## 2021-07-14 ENCOUNTER — Encounter: Payer: Self-pay | Admitting: Adult Health

## 2021-07-14 ENCOUNTER — Ambulatory Visit: Payer: Medicare Other | Admitting: Adult Health

## 2021-07-14 VITALS — BP 117/74 | HR 55 | Ht 70.0 in | Wt 148.0 lb

## 2021-07-14 DIAGNOSIS — G40009 Localization-related (focal) (partial) idiopathic epilepsy and epileptic syndromes with seizures of localized onset, not intractable, without status epilepticus: Secondary | ICD-10-CM | POA: Diagnosis not present

## 2021-07-14 DIAGNOSIS — Z5181 Encounter for therapeutic drug level monitoring: Secondary | ICD-10-CM | POA: Diagnosis not present

## 2021-07-14 NOTE — Patient Instructions (Addendum)
Your Plan: ? ?Continue current dose of lamotrigine  200mg  twice daily  ? ?We will check levels today ? ?Plan on repeat EEG beginning of next month  ? ? ? ? ?Follow up in 4 months or call earlier if needed  ? ? ? ? ?Thank you for coming to see at [facility]. I hope we have been able to provide you high quality care today. ? ?You may receive a patient satisfaction survey over the next few weeks. We would appreciate your feedback and comments so that we may continue to improve ourselves and the health of our patients. ? ? ? ?Seizure, Adult ?A seizure is a sudden burst of abnormal electrical and chemical activity in the brain. Seizures usually last from 30 seconds to 2 minutes.  ?What are the causes? ?Common causes of this condition include: ?Fever or infection. ?Problems that affect the brain. These may include: ?A brain or head injury. ?Bleeding in the brain. ?A brain tumor. ?Low levels of blood sugar or salt. ?Kidney problems or liver problems. ?Conditions that are passed from parent to child (are inherited). ?Problems with a substance, such as: ?Having a reaction to a drug or a medicine. ?Stopping the use of a substance all of a sudden (withdrawal). ?A stroke. ?Disorders that affect how you develop. ?Sometimes, the cause may not be known.  ?What increases the risk? ?Having someone in your family who has epilepsy. In this condition, seizures happen again and again over time. They have no clear cause. ?Having had a tonic-clonic seizure before. This type of seizure causes you to: ?Tighten the muscles of the whole body. ?Lose consciousness. ?Having had a head injury or strokes before. ?Having had a lack of oxygen at birth. ?What are the signs or symptoms? ?There are many types of seizures. The symptoms vary depending on the type of seizure you have. ?Symptoms during a seizure ?Shaking that you cannot control (convulsions) with fast, jerky movements of muscles. ?Stiffness of the body. ?Breathing  problems. ?Feeling mixed up (confused). ?Staring or not responding to sound or touch. ?Head nodding. ?Eyes that blink, flutter, or move fast. ?Drooling, grunting, or making clicking sounds with your mouth ?Losing control of when you pee or poop. ?Symptoms before a seizure ?Feeling afraid, nervous, or worried. ?Feeling like you may vomit. ?Feeling like: ?You are moving when you are not. ?Things around you are moving when they are not. ?Feeling like you saw or heard something before (d?j? vu). ?Odd tastes or smells. ?Changes in how you see. You may see flashing lights or spots. ?Symptoms after a seizure ?Feeling confused. ?Feeling sleepy. ?Headache. ?Sore muscles. ?How is this treated? ?If your seizure stops on its own, you will not need treatment. If your seizure lasts longer than 5 minutes, you will normally need treatment. Treatment may include: ?Medicines given through an IV tube. ?Avoiding things, such as medicines, that are known to cause your seizures. ?Medicines to prevent seizures. ?A device to prevent or control seizures. ?Surgery. ?A diet low in carbohydrates and high in fat (ketogenic diet). ?Follow these instructions at home: ?Medicines ?Take over-the-counter and prescription medicines only as told by your doctor. ?Avoid foods or drinks that may keep your medicine from working, such as alcohol. ?Activity ?Follow instructions about driving, swimming, or doing things that would be dangerous if you had another seizure. Wait until your doctor says it is safe for you to do these things. ?If you live in the [place] ask your local department of motor vehicles  when you can drive. ?Get a lot of rest. ?Teaching others ? ?Teach friends and family what to do when you have a seizure. They should: ?Help you get down to the ground. ?Protect your head and body. ?Loosen any clothing around your neck. ?Turn you on your side. ?Know whether or not you need emergency care. ?Stay with you until you are better. ?Also, tell them  what not to do if you have a seizure. Tell them: ?They should not hold you down. ?They should not put anything in your mouth. ?General instructions ?Avoid anything that gives you seizures. ?Keep a seizure diary. Write down: ?What you remember about each seizure. ?What you think caused each seizure. ?Keep all follow-up visits. ?Contact a doctor if: ?You have another seizure or seizures. Call the doctor each time you have a seizure. ?The pattern of your seizures changes. ?You keep having seizures with treatment. ?You have symptoms of being sick or having an infection. ?You are not able to take your medicine. ?Get help right away if: ?You have any of these problems: ?A seizure that lasts longer than 5 minutes. ?Many seizures in a row and you do not feel better between seizures. ?A seizure that makes it harder to breathe. ?A seizure and you can no longer speak or use part of your body. ?You do not wake up right after a seizure. ?You get hurt during a seizure. ?You feel confused or have pain right after a seizure. ?These symptoms may be an emergency. Get help right away. Call your local emergency services (911 in the [place] ?Do not wait to see if the symptoms will go away. ?Do not drive yourself to the hospital. ?Summary ?A seizure is a sudden burst of abnormal electrical and chemical activity in the brain. Seizures normally last from 30 seconds to 2 minutes. ?Causes of seizures include illness, injury to the head, low levels of blood sugar or salt, and certain conditions. ?Most seizures will stop on their own in less than 5 minutes. Seizures that last longer than 5 minutes are a medical emergency and need treatment right away. ?Many medicines are used to treat seizures. Take over-the-counter and prescription medicines only as told by your doctor. ?This information is not intended to replace advice given to you by your health care provider. Make sure you discuss any questions you have with your health care  provider. ?Document Revised: 10/24/2019 Document Reviewed: 10/24/2019 ?Elsevier Patient Education ? 2022 Elsevier Inc. ? ?

## 2021-07-15 LAB — LAMOTRIGINE LEVEL: Lamotrigine Lvl: 11.1 ug/mL (ref 2.0–20.0)

## 2021-07-25 ENCOUNTER — Encounter: Payer: Self-pay | Admitting: Neurology

## 2021-07-25 ENCOUNTER — Other Ambulatory Visit: Payer: Medicare Other | Admitting: *Deleted

## 2021-08-22 ENCOUNTER — Ambulatory Visit: Payer: Medicare Other | Admitting: Adult Health

## 2021-10-13 ENCOUNTER — Other Ambulatory Visit: Payer: Self-pay

## 2021-10-13 NOTE — Patient Outreach (Signed)
[facility]) Care Management  10/13/2021  Matthew [name] April 21, 1956 [id number]   Telephone outreach to patient to obtain mRS was successfully completed. MRS= 1  [name] [facility] Care Management Assistant [phone]

## 2021-10-30 ENCOUNTER — Emergency Department (HOSPITAL_COMMUNITY): Payer: Medicare Other

## 2021-10-30 ENCOUNTER — Inpatient Hospital Stay (HOSPITAL_COMMUNITY)
Admission: EM | Admit: 2021-10-30 | Discharge: 2021-11-04 | DRG: 917 | Disposition: A | Discharge status: Home Health | Payer: Medicare Other | Attending: Internal Medicine | Admitting: Internal Medicine

## 2021-10-30 DIAGNOSIS — Z87891 Personal history of nicotine dependence: Secondary | ICD-10-CM

## 2021-10-30 DIAGNOSIS — T43591A Poisoning by other antipsychotics and neuroleptics, accidental (unintentional), initial encounter: Secondary | ICD-10-CM | POA: Diagnosis not present

## 2021-10-30 DIAGNOSIS — R4182 Altered mental status, unspecified: Secondary | ICD-10-CM | POA: Diagnosis not present

## 2021-10-30 DIAGNOSIS — F319 Bipolar disorder, unspecified: Secondary | ICD-10-CM | POA: Diagnosis present

## 2021-10-30 DIAGNOSIS — E876 Hypokalemia: Secondary | ICD-10-CM | POA: Diagnosis present

## 2021-10-30 DIAGNOSIS — G928 Other toxic encephalopathy: Secondary | ICD-10-CM | POA: Diagnosis present

## 2021-10-30 DIAGNOSIS — K219 Gastro-esophageal reflux disease without esophagitis: Secondary | ICD-10-CM | POA: Diagnosis present

## 2021-10-30 DIAGNOSIS — Z8249 Family history of ischemic heart disease and other diseases of the circulatory system: Secondary | ICD-10-CM

## 2021-10-30 DIAGNOSIS — I129 Hypertensive chronic kidney disease with stage 1 through stage 4 chronic kidney disease, or unspecified chronic kidney disease: Secondary | ICD-10-CM | POA: Diagnosis present

## 2021-10-30 DIAGNOSIS — N179 Acute kidney failure, unspecified: Secondary | ICD-10-CM | POA: Diagnosis not present

## 2021-10-30 DIAGNOSIS — N400 Enlarged prostate without lower urinary tract symptoms: Secondary | ICD-10-CM | POA: Diagnosis present

## 2021-10-30 DIAGNOSIS — G40909 Epilepsy, unspecified, not intractable, without status epilepticus: Secondary | ICD-10-CM | POA: Diagnosis present

## 2021-10-30 DIAGNOSIS — N183 Chronic kidney disease, stage 3 unspecified: Secondary | ICD-10-CM | POA: Diagnosis present

## 2021-10-30 DIAGNOSIS — G4733 Obstructive sleep apnea (adult) (pediatric): Secondary | ICD-10-CM | POA: Diagnosis present

## 2021-10-30 DIAGNOSIS — Z20822 Contact with and (suspected) exposure to covid-19: Secondary | ICD-10-CM | POA: Diagnosis present

## 2021-10-30 DIAGNOSIS — Z79899 Other long term (current) drug therapy: Secondary | ICD-10-CM

## 2021-10-30 DIAGNOSIS — G43909 Migraine, unspecified, not intractable, without status migrainosus: Secondary | ICD-10-CM | POA: Diagnosis present

## 2021-10-30 DIAGNOSIS — F410 Panic disorder [episodic paroxysmal anxiety] without agoraphobia: Secondary | ICD-10-CM | POA: Diagnosis present

## 2021-10-30 DIAGNOSIS — R4586 Emotional lability: Secondary | ICD-10-CM | POA: Diagnosis present

## 2021-10-30 DIAGNOSIS — Z888 Allergy status to other drugs, medicaments and biological substances status: Secondary | ICD-10-CM

## 2021-10-30 DIAGNOSIS — I10 Essential (primary) hypertension: Secondary | ICD-10-CM | POA: Diagnosis present

## 2021-10-30 LAB — COMPREHENSIVE METABOLIC PANEL
ALT: 13 U/L (ref 0–44)
AST: 15 U/L (ref 15–41)
Albumin: 3.7 g/dL (ref 3.5–5.0)
Alkaline Phosphatase: 49 U/L (ref 38–126)
Anion gap: 5 (ref 5–15)
BUN: 42 mg/dL — ABNORMAL HIGH (ref 8–23)
CO2: 21 mmol/L — ABNORMAL LOW (ref 22–32)
Calcium: 9.6 mg/dL (ref 8.9–10.3)
Chloride: 112 mmol/L — ABNORMAL HIGH (ref 98–111)
Creatinine, Ser: 1.78 mg/dL — ABNORMAL HIGH (ref 0.61–1.24)
GFR, Estimated: 42 mL/min — ABNORMAL LOW (ref >60)
Glucose, Bld: 86 mg/dL (ref 70–99)
Potassium: 3.1 mmol/L — ABNORMAL LOW (ref 3.5–5.1)
Sodium: 138 mmol/L (ref 135–145)
Total Bilirubin: 0.8 mg/dL (ref 0.3–1.2)
Total Protein: 5.5 g/dL — ABNORMAL LOW (ref 6.5–8.1)

## 2021-10-30 LAB — URINALYSIS, ROUTINE W REFLEX MICROSCOPIC
Bacteria, UA: NONE SEEN
Bilirubin Urine: NEGATIVE
Glucose, UA: NEGATIVE mg/dL
Hgb urine dipstick: NEGATIVE
Ketones, ur: 5 mg/dL — AB
Leukocytes,Ua: NEGATIVE
Nitrite: NEGATIVE
Protein, ur: 30 mg/dL — AB
Specific Gravity, Urine: 1.014 (ref 1.005–1.030)
pH: 7 (ref 5.0–8.0)

## 2021-10-30 LAB — RAPID URINE DRUG SCREEN, HOSP PERFORMED
Amphetamines: NOT DETECTED
Barbiturates: NOT DETECTED
Benzodiazepines: NOT DETECTED
Cocaine: NOT DETECTED
Opiates: POSITIVE — AB
Tetrahydrocannabinol: POSITIVE — AB

## 2021-10-30 LAB — RESP PANEL BY RT-PCR (FLU A&B, COVID) ARPGX2
Influenza A by PCR: NEGATIVE
Influenza B by PCR: NEGATIVE
SARS Coronavirus 2 by RT PCR: NEGATIVE

## 2021-10-30 LAB — PROTIME-INR
INR: 1.1 (ref 0.8–1.2)
Prothrombin Time: 14.4 seconds (ref 11.4–15.2)

## 2021-10-30 LAB — ETHANOL: Alcohol, Ethyl (B): <10 mg/dL (ref <10)

## 2021-10-30 LAB — APTT: aPTT: 31 seconds (ref 24–36)

## 2021-10-30 LAB — CBG MONITORING, ED: Glucose-Capillary: 92 mg/dL (ref 70–99)

## 2021-10-30 LAB — PSA: Prostatic Specific Antigen: 2.3 ng/mL (ref 0.00–4.00)

## 2021-10-30 MED ORDER — LACTATED RINGERS IV SOLN: INTRAVENOUS | Status: DC

## 2021-10-30 MED ORDER — POTASSIUM CHLORIDE 10 MEQ/100ML IV SOLN
10.0000 meq | Freq: Once | INTRAVENOUS | Status: AC
  Administered 2021-10-30: 10 meq via INTRAVENOUS
  Filled 2021-10-30: qty 100

## 2021-10-30 MED ORDER — POTASSIUM CHLORIDE CRYS ER 20 MEQ PO TBCR: 40.0000 meq | EXTENDED_RELEASE_TABLET | Freq: Once | ORAL | Status: DC

## 2021-10-30 MED ORDER — LORAZEPAM 2 MG/ML IJ SOLN
0.5000 mg | Freq: Once | INTRAMUSCULAR | Status: AC
  Administered 2021-10-31: 0.5 mg via INTRAVENOUS
  Filled 2021-10-30: qty 1

## 2021-10-30 MED ORDER — ENOXAPARIN SODIUM 40 MG/0.4ML IJ SOSY
40.0000 mg | PREFILLED_SYRINGE | Freq: Every day | INTRAMUSCULAR | Status: DC
  Administered 2021-10-31 – 2021-11-03 (×4): 40 mg via SUBCUTANEOUS
  Filled 2021-10-30 (×5): qty 0.4

## 2021-10-30 MED ORDER — SODIUM CHLORIDE 0.9 % IV BOLUS
1000.0000 mL | Freq: Once | INTRAVENOUS | Status: AC
  Administered 2021-10-30: 1000 mL via INTRAVENOUS

## 2021-10-30 MED ORDER — LORAZEPAM 2 MG/ML IJ SOLN
1.0000 mg | Freq: Once | INTRAMUSCULAR | Status: AC
  Administered 2021-10-30: 1 mg via INTRAVENOUS
  Filled 2021-10-30: qty 1

## 2021-10-30 MED ORDER — IOHEXOL 300 MG/ML  SOLN
80.0000 mL | Freq: Once | INTRAMUSCULAR | Status: AC | PRN
  Administered 2021-10-30: 80 mL via INTRAVENOUS

## 2021-10-30 NOTE — H&P (Addendum)
History and Physical    Patient: Matthew [name]:[id number] DOB: March 29, 1956 DOA: 10/30/2021 DOS: the patient was seen and examined on 10/30/2021 PCP: Soundra Pilon, FNP  Patient coming from: Home  Chief Complaint:  Chief Complaint  Patient presents with   Abdominal Pain   HPI: Matthew [name] is a 66 y.o. male with medical history significant of seizure, Bipolar disorder, HTN, migraines, substance abuse who presents with altered mental status.  History provided by wife at bedside as patient has altered mental status.  She reports that ever since his last admission for seizures in March 2023 he has been about "80%" in terms of his physical and mental health.  She was out of town about 2-1/2 weeks ago and when she came back a week ago she noticed that he was more weak and sleepy.  Having nausea, vomiting burping heartburn and lots of hiccups.  He then began to have hallucinations of people hunting turkeys in his yard, stealing water and then later calling 911.  Wife then went through his pillbox and noticed there was inconsistent amount of pills especially his Lamictal.  He was discharged here in March with an increase of his Lamictal to 200 twice daily.  However his psychiatrist in [place] recently decreased his Lamictal and added on Depakote. However it seems pt continued to take Lamictal twice daily with the addition of Depakote. Also was taking Seroquel which has made him sick in the past. He was surprised when wife looked up the pill and he did not realize he was even taking it. She has since decreased him down to one Lamictal and cut his Depakote in half. However he continues to be more disoriented, falling, and found him locking his cat in his car and putting liquid soap into dish washer which he has never done before. Also more emotionally labile and bursting out in tears today.  Wife is also concerned about dioxane in their water supply affecting him. Denies alcohol use. Has  occasionally marijuana use.  Regarding his admission at [facility] from 07/05/2021-07/07/2021, he was admitted by neurology for confusion and difficulty speaking.  On admission, he was evaluated by stroke given TNK to treat stroke symptoms.  However MRI revealed that he had not had a stroke and EEG revealed epileptogenicity and cortical dysfunction arising from the left frontal temporal region.  His home Lamictal at that time was increased to 200 mg twice daily for seizure control.  In the ED he was afebrile, mildly bradycardic and normotensive on room air.  CBC pending at time of admission.  Has mild hypokalemia with potassium of 3.1.  AKI with creatinine of 1.78 up from 1.01.He was restless, able to answer some questions and say yes to commands but not actually performing them.   UDS positive for opioids and THC. Negative CT head CT abdomen/pelvis was negative other than severe prostate gland enlargement.  Neurology was consulted and recommended MRI brain and if negative to consider EEG.  Recommend medicine admission. Review of Systems: unable to review all systems due to the inability of the patient to answer questions. Past Medical History:  Diagnosis Date   Anxiety    on meds   AVM (arteriovenous malformation) of colon 02/04/2021   Bipolar I disorder, most recent episode (or current) mixed, moderate    GERD (gastroesophageal reflux disease)    Hypertension    on meds   Migraines    OSA (obstructive sleep apnea)    retested-bipap not needed  Panic attacks    Seizures (HCC)    last known 05/2020;   Substance abuse [facility])    Past Surgical History:  Procedure Laterality Date   CARPOMETACARPEL ([facility]) FUSION OF THUMB WITH AUTOGRAFT FROM RADIUS Bilateral    COLONOSCOPY  2011   CG-in pt at [facility]-   FINGER ARTHROPLASTY Left 06/17/2012   Procedure: FINGER ARTHROPLASTY;  Surgeon: Jodi Marble, MD;  Location[facility];  Service: Orthopedics;  Laterality: Left;  left thumb  suspension arthroplasty    HEMORRHOID SURGERY  2000   Dr Earlene Plater   TONSILLECTOMY     WISDOM TOOTH EXTRACTION     Social History:  reports that he has quit smoking. His smoking use included pipe and cigars. He has never used smokeless tobacco. He reports that he does not drink alcohol and does not use drugs.  Allergies  Allergen Reactions   Sumatriptan Other (See Comments)    ( Imitrex)- dizziness   Tramadol     Severe dizziness    Family History  Problem Relation Age of Onset   Depression Mother    Hypertension Father    Cancer Father 60       brain tumor   Heart disease Paternal Grandmother    Arthritis Other    Hypertension Other    Colon polyps Neg Hx    Colon cancer Neg Hx    Esophageal cancer Neg Hx    Rectal cancer Neg Hx    Stomach cancer Neg Hx     Prior to Admission medications   Medication Sig Start Date End Date Taking? Authorizing Provider  amLODipine (NORVASC) 5 MG tablet Take 5 mg by mouth daily.    [provider]  benazepril (LOTENSIN) 20 MG tablet Take 20 mg by mouth daily.    [provider]  carvedilol (COREG) 25 MG tablet TAKE 1 TABLET BY MOUTH TWICE DAILY WITH A MEAL Patient taking differently: Take 25 mg by mouth 2 (two) times daily with a meal. 10/04/11   Plotnikov, Georgina Quint, MD  clonazePAM (KLONOPIN) 0.5 MG tablet Take 0.5 mg by mouth daily as needed.    [provider]  lamoTRIgine (LAMICTAL) 200 MG tablet Take 1 tablet (200 mg total) by mouth 2 (two) times daily. 07/07/21   de Saintclair Halsted, Cortney E, NP  lithium 300 MG capsule Take 300 mg by mouth every morning.    [provider]  lithium carbonate 300 MG capsule Take 600 mg by mouth at bedtime.    [provider]  rOPINIRole (REQUIP) 1 MG tablet Take 1 mg by mouth at bedtime. 07/01/18   [provider]  zaleplon (SONATA) 10 MG capsule Take 10 mg by mouth at bedtime as needed for sleep.    [provider]    Physical Exam: Vitals:    10/30/21 1636 10/30/21 1639 10/30/21 1642 10/30/21 2234  BP:  139/80  (!) 155/78  Pulse:  (!) 57  (!) 56  Resp:  (!) 22  19  Temp:  97.8 F (36.6 C)  98.8 F (37.1 C)  TempSrc:  Oral  Oral  SpO2: 98% 99%  100%  Weight:   60 kg   Height:   5\' 5"  (1.651 m)    Constitutional: NAD, intermittently restless elderly male laying on recliner in the hallway.  Patient with eyes closed.  He knew that he was in the hospital and would answer yes when asked if he could do a command but would not actually  perform it. Eyes: PERRL, lids and conjunctivae normal-patient would not open eyes actively.  Require lifting of his upper lids to perform exam. ENMT: Mucous membranes are moist. Neck: normal, supple Respiratory: clear to auscultation bilaterally, no wheezing, no crackles. Normal respiratory effort. No accessory muscle use.  Cardiovascular: Regular rate and rhythm, no murmurs / rubs / gallops. No extremity edema.   Abdomen: no tenderness, no masses palpated. Bowel sounds positive.  Musculoskeletal: no clubbing / cyanosis. No joint deformity upper and lower extremities. Good ROM, no contractures. Normal muscle tone.  Skin: no rashes, lesions, ulcers.  Neurologic: Alert but Eyes closed.  He is aware that he is in [place] and that wife is at bedside.  Otherwise can say yes if he could perform a command but would not actually do it.  Has active range of motion in all extremities.   Psychiatric: Emotionally labile.  Would frequently start crying. Data Reviewed:  See HPI  Assessment and Plan: * AMS (altered mental status) Most likely from polypharmacy with underlying bipolar disorder/seizure. Pt has multiple neurocognitive medications on his list and is unclear how often he overdoses or does fast taper of unintentionally. Also has been getting mixed instruction from his psychiatrist in [place] and neurology here on dosages. On his medication list there is Lamictal, lithium, Depakote, Seroquel and as  needed Sonata and Klonopin. -will hold these medications and check Lamictal, lithium and Depakote level -UDS is positive for THC and opiates - CT head is negative.  Neurology recommends MRI brain and if negative should pursue EEG -Needs psychiatric eval tomorrow in the morning  Hypokalemia Potassium of 3.1.  Repleted with IV 10 mg K x1 and oral 40 mEq in the ED.  We will also check a magnesium level.  Enlarged prostate Incidental finding on CT abdomen/pelvis.  PSA obtained by ED PA within normal limits.  Wife is not aware of any urinary symptoms.  AKI (acute kidney injury) (HCC) Creatinine elevated 1.78 from a prior of 1.01.  Continuous IV LR fluid overnight. -Avoid nephrotoxic agent.  Essential hypertension Continue home meds pending med rec      Advance Care Planning:   Code Status: Full Code   Consults: Neurology  Family Communication: Discussed with wife at bedside  Severity of Illness: The appropriate patient status for this patient is OBSERVATION. Observation status is judged to be reasonable and necessary in order to provide the required intensity of service to ensure the patient's safety. The patient's presenting symptoms, physical exam findings, and initial radiographic and laboratory data in the context of their medical condition is felt to place them at decreased risk for further clinical deterioration. Furthermore, it is anticipated that the patient will be medically stable for discharge from the hospital within 2 midnights of admission.   Author: Anselm Jungling, DO 10/30/2021 11:03 PM  For on call review [email or web address].

## 2021-10-30 NOTE — ED Notes (Signed)
IP MD at bedside 

## 2021-10-30 NOTE — Assessment & Plan Note (Addendum)
Most likely from polypharmacy with underlying bipolar disorder/seizure. Pt has multiple neurocognitive medications on his list and is unclear how often he overdoses or does fast taper of unintentionally. Also has been getting mixed instruction from his psychiatrist in [place] and neurology here on dosages. On his medication list there is Lamictal, lithium, Depakote, Seroquel and as needed Sonata and Klonopin. -will hold these medications and check Lamictal, lithium and Depakote level -UDS is positive for THC and opiates - CT head is negative.  Neurology recommends MRI brain and if negative should pursue EEG -Needs psychiatric eval tomorrow in the morning

## 2021-10-30 NOTE — ED Notes (Signed)
Patient found trying to get out of the chair without assistance. Patient reports that he doesn't know where he is trying to go. This RN assists the patient back to the chair and reminds the patient to ask for help as needed.

## 2021-10-30 NOTE — Assessment & Plan Note (Addendum)
Creatinine elevated 1.78 from a prior of 1.01.  Continuous IV LR fluid overnight. -Avoid nephrotoxic agent.

## 2021-10-30 NOTE — Assessment & Plan Note (Signed)
Potassium of 3.1.  Repleted with IV 10 mg K x1 and oral 40 mEq in the ED.  We will also check a magnesium level.

## 2021-10-30 NOTE — Assessment & Plan Note (Signed)
Incidental finding on CT abdomen/pelvis.  PSA obtained by ED PA within normal limits.  Wife is not aware of any urinary symptoms.

## 2021-10-30 NOTE — ED Provider Notes (Signed)
[facility] EMERGENCY DEPARTMENT Provider Note   CSN: [id number] Arrival date & time: 10/30/21  1635     History  Chief Complaint  Patient presents with   Abdominal Pain    Matthew [name] is a 66 y.o. male with medical history of bipolar 1 disorder, panic attacks, seizures, migraines, substance abuse.  Patient presents to ED via EMS for complaint of abdominal pain.  When I went to assess the patient, it appeared very evident that the patient was altered mentally.  On chart view, appears this patient had a similar episode on 3/7 when he presented to the ED for an episode of confusion.  The patient was brought in by his wife at this time.  Per chart view, it appears that there was an incident that he had about a year ago where he had a fall and a possible seizure-like activity at this time.  The episodes that were described at this time were concerning for partial complex seizures and the patient was placed on Lamictal.  Patient is also on the medical for bipolar disorder.  Prior to 1 year ago, the patient had no prior diagnosed seizures.  The last episode on 3/7, the patient and his wife were driving back from [place] where he sees psychiatrist.  They were at a gas station filling up gas when he had a sudden onset of nonsensical type speech.  The patient wife states he was able to walk normally at this time.  Patient wife brought patient to ED at this time.  Today with my conversation with the wife, she states that 1 week ago the patient had an episode of increased nausea and vomiting which resolved after 1 day.  The patient wife then continues that the patient had hallucinations last Friday and Saturday where he believed that he was seeing people in his yard with guns.  The wife continues that over the last 3 days the patient has had progressively worsening erratic behavior.  This includes putting hand soap in the dishwasher, locking his cat in his truck, having a decreased  appetite and falling more often.  The patient wife states that the patient sees a psychiatrist in [place] as well as neurologist here in [place].  The patient wife states that the neurologist in [place] often changes the dosages of medications that the patient is on that the neurologist in [place] is placed him on.   Abdominal Pain      Home Medications Prior to Admission medications   Medication Sig Start Date End Date Taking? Authorizing Provider  amLODipine (NORVASC) 5 MG tablet Take 5 mg by mouth daily.    [provider]  benazepril (LOTENSIN) 20 MG tablet Take 20 mg by mouth daily.    [provider]  carvedilol (COREG) 25 MG tablet TAKE 1 TABLET BY MOUTH TWICE DAILY WITH A MEAL Patient taking differently: Take 25 mg by mouth 2 (two) times daily with a meal. 10/04/11   Plotnikov, Georgina Quint, MD  clonazePAM (KLONOPIN) 0.5 MG tablet Take 0.5 mg by mouth daily as needed.    [provider]  lamoTRIgine (LAMICTAL) 200 MG tablet Take 1 tablet (200 mg total) by mouth 2 (two) times daily. 07/07/21   de Saintclair Halsted, Cortney E, NP  lithium 300 MG capsule Take 300 mg by mouth every morning.    [provider]  lithium carbonate 300 MG capsule Take 600 mg by mouth at bedtime.    [provider]  rOPINIRole (  REQUIP) 1 MG tablet Take 1 mg by mouth at bedtime. 07/01/18   [provider]  zaleplon (SONATA) 10 MG capsule Take 10 mg by mouth at bedtime as needed for sleep.    [provider]      Allergies    Sumatriptan and Tramadol    Review of Systems   Review of Systems  Unable to perform ROS: Mental status change (Level 5 caveat)    Physical Exam Updated Vital Signs BP (!) 155/78 (BP Location: Left Arm)   Pulse (!) 56   Temp 98.8 F (37.1 C) (Oral)   Resp 19   Ht 5\' 5"  (1.651 m)   Wt 60 kg   SpO2 100%   BMI 22.01 kg/m  Physical Exam Vitals and nursing note reviewed.  Constitutional:      General: He is not in acute  distress.    Appearance: He is not ill-appearing, toxic-appearing or diaphoretic.  HENT:     Head: Normocephalic and atraumatic.  Eyes:     Conjunctiva/sclera: Conjunctivae normal.     Pupils: Pupils are equal, round, and reactive to light.  Cardiovascular:     Rate and Rhythm: Normal rate and regular rhythm.  Pulmonary:     Effort: Pulmonary effort is normal.     Breath sounds: Normal breath sounds. No wheezing.  Abdominal:     General: Abdomen is flat.     Palpations: Abdomen is soft.     Tenderness: There is abdominal tenderness. There is guarding.  Musculoskeletal:     Cervical back: Normal range of motion and neck supple. No tenderness.  Skin:    General: Skin is warm and dry.     Capillary Refill: Capillary refill takes less than 2 seconds.  Neurological:     Mental Status: He is lethargic, disoriented and confused.     Comments: Patient is awake and alert.  He answers every question with either "I do not feel good". I cannot get him to name objects.  When I ask him to name objects or ask him orientation questions, he says "I don't know".  He is not aware what city we are in, what year it is, how many quarters make a dollar.  The patient will start crying inconsolably throughout the examination and interview.  He does not have any slurred speech.  He will hold his arms up and has normal appearing strength in both of his arms.  He would not hold his legs up for me but is moving his legs symmetrically.  He seems to have normal sensation but it is difficult to assess given the has not following commands well.  No obvious facial drooping.      ED Results / Procedures / Treatments   Labs (all labs ordered are listed, but only abnormal results are displayed) Labs Reviewed  COMPREHENSIVE METABOLIC PANEL - Abnormal; Notable for the following components:      Result Value   Potassium 3.1 (*)    Chloride 112 (*)    CO2 21 (*)    BUN 42 (*)    Creatinine, Ser 1.78 (*)    Total  Protein 5.5 (*)    GFR, Estimated 42 (*)    All other components within normal limits  RAPID URINE DRUG SCREEN, HOSP PERFORMED - Abnormal; Notable for the following components:   Opiates POSITIVE (*)    Tetrahydrocannabinol POSITIVE (*)    All other components within normal limits  URINALYSIS, ROUTINE W REFLEX MICROSCOPIC - Abnormal;  Notable for the following components:   Ketones, ur 5 (*)    Protein, ur 30 (*)    All other components within normal limits  RESP PANEL BY RT-PCR (FLU A&B, COVID) ARPGX2  APTT  ETHANOL  PROTIME-INR  PSA  CBC  MAGNESIUM  LAMOTRIGINE LEVEL  VALPROIC ACID LEVEL  LITHIUM LEVEL  BASIC METABOLIC PANEL  CBG MONITORING, ED    EKG None  Radiology CT ABDOMEN PELVIS W CONTRAST  Result Date: 10/30/2021 CLINICAL DATA:  Abdominal pain. EXAM: CT ABDOMEN AND PELVIS WITH CONTRAST TECHNIQUE: Multidetector CT imaging of the abdomen and pelvis was performed using the standard protocol following bolus administration of intravenous contrast. RADIATION DOSE REDUCTION: This exam was performed according to the departmental dose-optimization program which includes automated exposure control, adjustment of the mA and/or kV according to patient size and/or use of iterative reconstruction technique. CONTRAST:  41mL OMNIPAQUE IOHEXOL 300 MG/ML  SOLN COMPARISON:  None Available. FINDINGS: Lower chest: Mild atelectasis is seen within the posterior aspect of the bilateral lung bases. Hepatobiliary: No focal liver abnormality is seen. No gallstones, gallbladder wall thickening, or biliary dilatation. Pancreas: Unremarkable. No pancreatic ductal dilatation or surrounding inflammatory changes. Spleen: Normal in size without focal abnormality. Adrenals/Urinary Tract: Adrenal glands are unremarkable. Kidneys are normal in size, without renal calculi or hydronephrosis. A 6 mm cystic appearing area is seen within the medial aspect of the mid to lower right kidney. Bladder is unremarkable.  Stomach/Bowel: Stomach is within normal limits. Appendix appears normal (best seen on coronal reformatted images 60 through 88, CT series 7). No evidence of bowel wall thickening, distention, or inflammatory changes. Noninflamed diverticula are seen throughout the descending and sigmoid colon. Vascular/Lymphatic: Aortic atherosclerosis. No enlarged abdominal or pelvic lymph nodes. Reproductive: There is moderate to marked severity prostate gland enlargement. Other: No abdominal wall hernia or abnormality. No abdominopelvic ascites. Musculoskeletal: Degenerative changes are seen throughout the lumbar spine. IMPRESSION: 1. Colonic diverticulosis. 2. Moderate to marked severity prostate gland enlargement. Correlation with PSA values is recommended. Aortic Atherosclerosis (ICD10-I70.0). Electronically Signed   By: Aram Candela M.D.   On: 10/30/2021 19:56   CT Head Wo Contrast  Result Date: 10/30/2021 CLINICAL DATA:  Altered mental status, unknown cause. EXAM: CT HEAD WITHOUT CONTRAST TECHNIQUE: Contiguous axial images were obtained from the base of the skull through the vertex without intravenous contrast. RADIATION DOSE REDUCTION: This exam was performed according to the departmental dose-optimization program which includes automated exposure control, adjustment of the mA and/or kV according to patient size and/or use of iterative reconstruction technique. COMPARISON:  Head CT 07/07/2021, brain MRI 07/06/2021 FINDINGS: Brain: No intracranial hemorrhage, mass effect, or midline shift. No hydrocephalus. The basilar cisterns are patent. No evidence of territorial infarct or acute ischemia. No extra-axial or intracranial fluid collection. Vascular: Atherosclerosis of skullbase vasculature without hyperdense vessel or abnormal calcification. Skull: No fracture or focal lesion. Sinuses/Orbits: Minimal opacification of a posterior right ethmoid air cell. No mastoid effusion. Unremarkable appearance to the orbits.  Other: None. IMPRESSION: Unremarkable noncontrast head CT. No acute intracranial abnormality. Electronically Signed   By: Narda Rutherford M.D.   On: 10/30/2021 19:55    Procedures Procedures    Medications Ordered in ED Medications  potassium chloride SA (KLOR-CON M) CR tablet 40 mEq (has no administration in time range)  LORazepam (ATIVAN) injection 0.5 mg (has no administration in time range)  lactated ringers infusion (has no administration in time range)  enoxaparin (LOVENOX) injection 40 mg (has no administration in  time range)  LORazepam (ATIVAN) injection 1 mg (1 mg Intravenous Given 10/30/21 1708)  potassium chloride 10 mEq in 100 mL IVPB (0 mEq Intravenous Stopped 10/30/21 2259)  sodium chloride 0.9 % bolus 1,000 mL (0 mLs Intravenous Stopped 10/30/21 2111)  iohexol (OMNIPAQUE) 300 MG/ML solution 80 mL (80 mLs Intravenous Contrast Given 10/30/21 1935)    ED Course/ Medical Decision Making/ A&P                           Medical Decision Making Amount and/or Complexity of Data Reviewed Labs: ordered. Radiology: ordered.  Risk Prescription drug management.   66 year old male presents to the ED for evaluation.  Please see HPI for further details.  On examination, the patient is crying inconsolably.  The patient continuously repeats "I do not feel good".  Patient cannot tell me what hurts, but does not feel good.  The patient is alert but not oriented.  Patient unable to follow commands.  The patient is afebrile and nontachycardic.  The patient's lung sounds are clear bilaterally.  The patient's abdomen is soft and compressible in all 4 quadrants however whenever I do touch the patient's abdomen he says "please do not do that".  Patient worked up utilizing the following labs and imaging studies interpreted by me personally: - Urinalysis has ketones, protein - Rapid urine drug screen is positive for opiates, tetrahydrocannabinol - Patient CMP shows decreased potassium of 3.1,  elevated creatinine 1.78 treated with normal saline fluid, decreased GFR to 42 - Prothrombin time 14.4 - INR 1.1 - PSA 2.3 - Respiratory panel negative for all - Ethanol unremarkable - APTT 31 - CT head without contrast shows no intracranial abnormality - CT abdomen pelvis without contrast shows no signs of intra-abdominal abnormality, there is noted increased size of prostate.  PSA has been ordered - MR brain without contrast pending  Patient wife states that this patient has a psychiatrist in [place] that he has seen for 25 years as well as a neurologist here in [place] that he has started seeing after his admission in March.  Patient wife states that these doctors often change dosages of this patient's medication without notifying the other doctor.  Patient wife states that in the last 1.5 months the [place] psychiatrist has altered the patient's medication set by his [facility] neurologist.  The patient's lamotrigine was originally set by his neurologist at 150 mg 2 times daily however the [place] doctor changed it to 200 mg 1 time daily and added Depakote.  Patient wife states that the patient stopped taking his Depakote 1 week ago after taking it for 2 weeks.  Patient also began taking Seroquel prescribed by his doctor in [place].  The patient's wife is not sure when the patient began taking this however he did stop taking this 1 week ago.  On chart view, it appears that the patient had an EEG conducted on 3/8 which showed epileptogenicity, cortical dysfunction arising from his left frontotemporal region.  The patient also had mild diffuse encephalopathy noted.  Neurology was consulted, neurologist advised that this patient should receive an MRI of his brain to rule out stroke, medical admit for renal function, repeat EEG assuming that MRI of brain is normal and get psych consult.  Hospitalist, Dr. Cyndia Bent, has agreed to admit the patient.  Dr. Cyndia Bent has been made aware of the  recommendations by neurology and she is in agreement.  The patient is stable at time of admission.  Final Clinical Impression(s) / ED Diagnoses Final diagnoses:  AKI (acute kidney injury) (HCC)  Altered mental status, unspecified altered mental status type    Rx / DC Orders ED Discharge Orders     None         Clent RidgesGroce, Chivon Lepage F, PA-C 10/30/21 2313    Vanetta MuldersZackowski, Scott, MD 10/30/21 2356

## 2021-10-30 NOTE — ED Notes (Signed)
Matthew [name] assisted walking to the toilet. UA collected and sent off. Pt changed and placed in clean dry

## 2021-10-30 NOTE — ED Notes (Signed)
Periods of crying with mumbling..I have been unable to reach this patient by phone.  A letter is being sent. To comfort and console.

## 2021-10-30 NOTE — Assessment & Plan Note (Signed)
Continue home meds pending med rec 

## 2021-10-30 NOTE — ED Triage Notes (Signed)
Arrived from home, pt family members called for abdominal pain that has increased. Pt crying on arrival. HX of inguinal hernia, bipolar and hypertension.

## 2021-10-31 ENCOUNTER — Observation Stay (HOSPITAL_COMMUNITY): Payer: Medicare Other

## 2021-10-31 DIAGNOSIS — Z20822 Contact with and (suspected) exposure to covid-19: Secondary | ICD-10-CM | POA: Diagnosis present

## 2021-10-31 DIAGNOSIS — G928 Other toxic encephalopathy: Secondary | ICD-10-CM | POA: Diagnosis present

## 2021-10-31 DIAGNOSIS — G43909 Migraine, unspecified, not intractable, without status migrainosus: Secondary | ICD-10-CM | POA: Diagnosis present

## 2021-10-31 DIAGNOSIS — Z87891 Personal history of nicotine dependence: Secondary | ICD-10-CM | POA: Diagnosis not present

## 2021-10-31 DIAGNOSIS — Z79899 Other long term (current) drug therapy: Secondary | ICD-10-CM | POA: Diagnosis not present

## 2021-10-31 DIAGNOSIS — F410 Panic disorder [episodic paroxysmal anxiety] without agoraphobia: Secondary | ICD-10-CM | POA: Diagnosis present

## 2021-10-31 DIAGNOSIS — I129 Hypertensive chronic kidney disease with stage 1 through stage 4 chronic kidney disease, or unspecified chronic kidney disease: Secondary | ICD-10-CM | POA: Diagnosis present

## 2021-10-31 DIAGNOSIS — N400 Enlarged prostate without lower urinary tract symptoms: Secondary | ICD-10-CM | POA: Diagnosis present

## 2021-10-31 DIAGNOSIS — G4733 Obstructive sleep apnea (adult) (pediatric): Secondary | ICD-10-CM | POA: Diagnosis present

## 2021-10-31 DIAGNOSIS — R4586 Emotional lability: Secondary | ICD-10-CM | POA: Diagnosis present

## 2021-10-31 DIAGNOSIS — G40909 Epilepsy, unspecified, not intractable, without status epilepticus: Secondary | ICD-10-CM | POA: Diagnosis present

## 2021-10-31 DIAGNOSIS — N183 Chronic kidney disease, stage 3 unspecified: Secondary | ICD-10-CM | POA: Diagnosis present

## 2021-10-31 DIAGNOSIS — N179 Acute kidney failure, unspecified: Secondary | ICD-10-CM | POA: Diagnosis present

## 2021-10-31 DIAGNOSIS — F319 Bipolar disorder, unspecified: Secondary | ICD-10-CM | POA: Diagnosis present

## 2021-10-31 DIAGNOSIS — R4182 Altered mental status, unspecified: Secondary | ICD-10-CM | POA: Diagnosis not present

## 2021-10-31 DIAGNOSIS — T43591A Poisoning by other antipsychotics and neuroleptics, accidental (unintentional), initial encounter: Secondary | ICD-10-CM | POA: Diagnosis present

## 2021-10-31 DIAGNOSIS — I10 Essential (primary) hypertension: Secondary | ICD-10-CM | POA: Diagnosis not present

## 2021-10-31 DIAGNOSIS — E876 Hypokalemia: Secondary | ICD-10-CM | POA: Diagnosis present

## 2021-10-31 DIAGNOSIS — Z888 Allergy status to other drugs, medicaments and biological substances status: Secondary | ICD-10-CM | POA: Diagnosis not present

## 2021-10-31 DIAGNOSIS — K219 Gastro-esophageal reflux disease without esophagitis: Secondary | ICD-10-CM | POA: Diagnosis present

## 2021-10-31 DIAGNOSIS — T56891A Toxic effect of other metals, accidental (unintentional), initial encounter: Secondary | ICD-10-CM | POA: Diagnosis not present

## 2021-10-31 DIAGNOSIS — Z8249 Family history of ischemic heart disease and other diseases of the circulatory system: Secondary | ICD-10-CM | POA: Diagnosis not present

## 2021-10-31 LAB — BASIC METABOLIC PANEL
Anion gap: 8 (ref 5–15)
Anion gap: 10 (ref 5–15)
Anion gap: 4 — ABNORMAL LOW (ref 5–15)
BUN: 35 mg/dL — ABNORMAL HIGH (ref 8–23)
BUN: 30 mg/dL — ABNORMAL HIGH (ref 8–23)
BUN: 24 mg/dL — ABNORMAL HIGH (ref 8–23)
CO2: 20 mmol/L — ABNORMAL LOW (ref 22–32)
CO2: 18 mmol/L — ABNORMAL LOW (ref 22–32)
CO2: 20 mmol/L — ABNORMAL LOW (ref 22–32)
Calcium: 9.3 mg/dL (ref 8.9–10.3)
Calcium: 9.7 mg/dL (ref 8.9–10.3)
Calcium: 9.4 mg/dL (ref 8.9–10.3)
Chloride: 113 mmol/L — ABNORMAL HIGH (ref 98–111)
Chloride: 116 mmol/L — ABNORMAL HIGH (ref 98–111)
Chloride: 119 mmol/L — ABNORMAL HIGH (ref 98–111)
Creatinine, Ser: 1.61 mg/dL — ABNORMAL HIGH (ref 0.61–1.24)
Creatinine, Ser: 1.4 mg/dL — ABNORMAL HIGH (ref 0.61–1.24)
Creatinine, Ser: 1.28 mg/dL — ABNORMAL HIGH (ref 0.61–1.24)
GFR, Estimated: 47 mL/min — ABNORMAL LOW (ref >60)
GFR, Estimated: 56 mL/min — ABNORMAL LOW (ref >60)
GFR, Estimated: >60 mL/min (ref >60)
Glucose, Bld: 79 mg/dL (ref 70–99)
Glucose, Bld: 76 mg/dL (ref 70–99)
Glucose, Bld: 94 mg/dL (ref 70–99)
Potassium: 3.2 mmol/L — ABNORMAL LOW (ref 3.5–5.1)
Potassium: 3.5 mmol/L (ref 3.5–5.1)
Potassium: 3.6 mmol/L (ref 3.5–5.1)
Sodium: 141 mmol/L (ref 135–145)
Sodium: 144 mmol/L (ref 135–145)
Sodium: 143 mmol/L (ref 135–145)

## 2021-10-31 LAB — LITHIUM LEVEL
Lithium Lvl: 2.5 mmol/L (ref 0.60–1.20)
Lithium Lvl: 2.01 mmol/L (ref 0.60–1.20)
Lithium Lvl: 1.75 mmol/L (ref 0.60–1.20)

## 2021-10-31 LAB — URINALYSIS, ROUTINE W REFLEX MICROSCOPIC
Bilirubin Urine: NEGATIVE
Glucose, UA: NEGATIVE mg/dL
Hgb urine dipstick: NEGATIVE
Ketones, ur: 20 mg/dL — AB
Leukocytes,Ua: NEGATIVE
Nitrite: NEGATIVE
Protein, ur: NEGATIVE mg/dL
Specific Gravity, Urine: 1.017 (ref 1.005–1.030)
pH: 8 (ref 5.0–8.0)

## 2021-10-31 LAB — SODIUM, URINE, RANDOM: Sodium, Ur: 88 mmol/L

## 2021-10-31 LAB — OSMOLALITY: Osmolality: 306 mOsm/kg — ABNORMAL HIGH (ref 275–295)

## 2021-10-31 LAB — URIC ACID: Uric Acid, Serum: 9.7 mg/dL — ABNORMAL HIGH (ref 3.7–8.6)

## 2021-10-31 LAB — CREATININE, URINE, RANDOM: Creatinine, Urine: 68.02 mg/dL

## 2021-10-31 LAB — TSH
TSH: 1.207 u[IU]/mL (ref 0.350–4.500)
TSH: 1.088 u[IU]/mL (ref 0.350–4.500)

## 2021-10-31 LAB — CBC
HCT: 32.8 % — ABNORMAL LOW (ref 39.0–52.0)
Hemoglobin: 10.9 g/dL — ABNORMAL LOW (ref 13.0–17.0)
MCH: 30.3 pg (ref 26.0–34.0)
MCHC: 33.2 g/dL (ref 30.0–36.0)
MCV: 91.1 fL (ref 80.0–100.0)
Platelets: 190 10*3/uL (ref 150–400)
RBC: 3.6 MIL/uL — ABNORMAL LOW (ref 4.22–5.81)
RDW: 11.7 % (ref 11.5–15.5)
WBC: 7.1 10*3/uL (ref 4.0–10.5)
nRBC: 0 % (ref 0.0–0.2)

## 2021-10-31 LAB — T4, FREE: Free T4: 0.94 ng/dL (ref 0.61–1.12)

## 2021-10-31 LAB — VALPROIC ACID LEVEL: Valproic Acid Lvl: 42 ug/mL — ABNORMAL LOW (ref 50.0–100.0)

## 2021-10-31 LAB — OSMOLALITY, URINE: Osmolality, Ur: 444 mosm/kg (ref 300–900)

## 2021-10-31 LAB — MAGNESIUM: Magnesium: 2.3 mg/dL (ref 1.7–2.4)

## 2021-10-31 MED ORDER — FAMOTIDINE 20 MG PO TABS
10.0000 mg | ORAL_TABLET | Freq: Every day | ORAL | Status: DC
  Administered 2021-10-31 – 2021-11-04 (×5): 10 mg via ORAL
  Filled 2021-10-31 (×5): qty 1

## 2021-10-31 MED ORDER — GLUCAGON HCL RDNA (DIAGNOSTIC) 1 MG IJ SOLR
5.0000 mg | Freq: Once | INTRAVENOUS | Status: AC
  Administered 2021-10-31: 5 mg via INTRAVENOUS
  Filled 2021-10-31: qty 5

## 2021-10-31 MED ORDER — LAMOTRIGINE 25 MG PO TABS
150.0000 mg | ORAL_TABLET | Freq: Two times a day (BID) | ORAL | Status: DC
  Administered 2021-10-31 – 2021-11-03 (×7): 150 mg via ORAL
  Filled 2021-10-31 (×6): qty 2
  Filled 2021-10-31: qty 6

## 2021-10-31 MED ORDER — POTASSIUM CHLORIDE 20 MEQ PO PACK: 40.0000 meq | PACK | Freq: Once | ORAL | Status: DC

## 2021-10-31 MED ORDER — CARVEDILOL 3.125 MG PO TABS
3.1250 mg | ORAL_TABLET | Freq: Two times a day (BID) | ORAL | Status: DC
  Administered 2021-10-31: 3.125 mg via ORAL
  Filled 2021-10-31: qty 1

## 2021-10-31 MED ORDER — ONDANSETRON HCL 4 MG/2ML IJ SOLN
4.0000 mg | Freq: Four times a day (QID) | INTRAMUSCULAR | Status: DC | PRN
  Administered 2021-10-31: 4 mg via INTRAVENOUS
  Filled 2021-10-31: qty 2

## 2021-10-31 MED ORDER — POTASSIUM CL IN DEXTROSE 5% 20 MEQ/L IV SOLN
20.0000 meq | INTRAVENOUS | Status: DC
  Administered 2021-10-31 – 2021-11-01 (×2): 20 meq via INTRAVENOUS
  Filled 2021-10-31 (×2): qty 1000

## 2021-10-31 MED ORDER — LACTATED RINGERS IV SOLN
INTRAVENOUS | Status: DC
Start: 2021-10-31 — End: 2021-10-31

## 2021-10-31 MED ORDER — CARVEDILOL 3.125 MG PO TABS: 6.2500 mg | ORAL_TABLET | Freq: Two times a day (BID) | ORAL | Status: DC

## 2021-10-31 NOTE — ED Notes (Signed)
Pt transferred from recliner to stretcher by this RN and 4 others

## 2021-10-31 NOTE — ED Notes (Signed)
Shalhoub MD notified of pts critical lactic 2.5

## 2021-10-31 NOTE — Consult Note (Signed)
  History provided by wife at bedside as patient has altered mental status.  She reports that ever since his last admission for seizures in March 2023 he has been about "80%" in terms of his physical and mental health.  She was out of town about 2-1/2 weeks ago and when she came back a week ago she noticed that he was more weak and sleepy.  Having nausea, vomiting burping heartburn and lots of hiccups.  He then began to have hallucinations of people hunting turkeys in his yard, stealing water and then later calling 911.  Wife then went through his pillbox and noticed there was inconsistent amount of pills especially his Lamictal.  He was discharged here in March with an increase of his Lamictal to 200 twice daily.  However his psychiatrist in [place] recently decreased his Lamictal and added on Depakote. However it seems pt continued to take Lamictal twice daily with the addition of Depakote. Also was taking Seroquel which has made him sick in the past. He was surprised when wife looked up the pill and he did not realize he was even taking it. She has since decreased him down to one Lamictal and cut his Depakote in half. However he continues to be more disoriented, falling, and found him locking his cat in his car and putting liquid soap into dish washer which he has never done before. Also more emotionally labile and bursting out in tears today.  Wife is also concerned about dioxane in their water supply affecting him. Denies alcohol use. Has occasionally marijuana use.  Psychiatry consultation for polypharmacy, bipolar, questionable attempted suicide.  Patient seen and attempted to assess, however remains severely encephalopathic.  Patient is disoriented, nonsensical speech, and only smiles and laughs.  Unable to participate in psychiatric evaluation.  Psychiatry consult service will continue to follow.  Review of labs show on lithium level of 2.5, trending down to 2.01; valproic acid level of 42, and the  Lamictal and Topamax currently pending.  -Continue current recommendations for lithium toxicity. Continue checking lithium level frequently, until returned to normal less than 1.0.  Due to elevated lithium levels recommend avoiding any use of serotonergic drugs that will increase lithium levels at this time.  No evident signs of impaired renal function as a result of elevated lithium levels, may continue IV hydration at this time.  -Psychiatry consult service will continue to follow.  Continue to hold current medication at this time.

## 2021-10-31 NOTE — Evaluation (Signed)
Clinical/Bedside Swallow Evaluation Patient Details  Name: Matthew [name] MRN: [id number] Date of Birth: May 13, 1955  Today's Date: 10/31/2021 Time: SLP Start Time (ACUTE ONLY): 1000 SLP Stop Time (ACUTE ONLY): 1010 SLP Time Calculation (min) (ACUTE ONLY): 10 min  Past Medical History:  Past Medical History:  Diagnosis Date   Anxiety    on meds   AVM (arteriovenous malformation) of colon 02/04/2021   Bipolar I disorder, most recent episode (or current) mixed, moderate    GERD (gastroesophageal reflux disease)    Hypertension    on meds   Migraines    OSA (obstructive sleep apnea)    retested-bipap not needed   Panic attacks    Seizures (HCC)    last known 05/2020;   Substance abuse (HCC)    Past Surgical History:  Past Surgical History:  Procedure Laterality Date   CARPOMETACARPEL (CMC) FUSION OF THUMB WITH AUTOGRAFT FROM RADIUS Bilateral    COLONOSCOPY  2011   CG[facility]in pt at [facility]-   FINGER ARTHROPLASTY Left 06/17/2012   Procedure: FINGER ARTHROPLASTY;  Surgeon: Jodi Marble, MD;  Location: [facility];  Service: Orthopedics;  Laterality: Left;  left thumb suspension arthroplasty    HEMORRHOID SURGERY  2000   Dr Earlene Plater   TONSILLECTOMY     WISDOM TOOTH EXTRACTION     HPI:  66 y.o. male with medical history significant of seizure, Bipolar disorder, HTN, migraines, substance abuse who presents with altered mental status.  He was noted to be acting confused by his wife, recently his psychiatrist in [place] had made multiple changes to his home medications, of note few months ago he was admitted to [facility] for possible seizure-like activity and at that time his Lamictal dose was increased.  Currently he is on multiple neurocognitive and psych medications with evidence of lithium toxicity in the ER.  He has been admitted for toxic encephalopathy and to be evaluated by psychiatry and neurology.    Assessment / Plan / Recommendation  Clinical Impression   Pt demonstrates cognitive impairment that impacts safety with PO. Pt does not follow commands, but with assist will take sips from a straw or cup edge. He is startled at times by the arrival of the bolus and has intermittent coughing after sips. Tolerates puree well but still somewhat suprised by assisted feeding. RN reports pt did well when given pills with water. Until pts attention to food and drink have improved recommend pt consume nectar thick liqudis to slow flow of sips for increased safety. WIll f/u for advancement. SLP Visit Diagnosis: Dysphagia, unspecified (R13.10)    Aspiration Risk  Mild aspiration risk    Diet Recommendation Dysphagia 3 (Mech soft);Nectar-thick liquid   Liquid Administration via: Cup;Straw Medication Administration: Whole meds with liquid Supervision: Patient able to self feed Compensations: Slow rate;Small sips/bites Postural Changes: Seated upright at 90 degrees    Other  Recommendations Oral Care Recommendations: Oral care BID Other Recommendations: Have oral suction available    Recommendations for follow up therapy are one component of a multi-disciplinary discharge planning process, led by the attending physician.  Recommendations may be updated based on patient status, additional functional criteria and insurance authorization.  Follow up Recommendations Skilled nursing-short term rehab (<3 hours/day)      Assistance Recommended at Discharge    Functional Status Assessment    Frequency and Duration min 2x/week  2 weeks       Prognosis        Swallow Study  General HPI: 66 y.o. male with medical history significant of seizure, Bipolar disorder, HTN, migraines, substance abuse who presents with altered mental status.  He was noted to be acting confused by his wife, recently his psychiatrist in [place] had made multiple changes to his home medications, of note few months ago he was admitted to [facility] for possible seizure-like  activity and at that time his Lamictal dose was increased.  Currently he is on multiple neurocognitive and psych medications with evidence of lithium toxicity in the ER.  He has been admitted for toxic encephalopathy and to be evaluated by psychiatry and neurology. Type of Study: Bedside Swallow Evaluation Previous Swallow Assessment: none Diet Prior to this Study: Dysphagia 3 (soft);Thin liquids Temperature Spikes Noted: No Respiratory Status: Room air History of Recent Intubation: No Behavior/Cognition: Alert;Distractible;Doesn't follow directions Oral Cavity Assessment: Within Functional Limits Oral Care Completed by SLP: No Oral Cavity - Dentition: Adequate natural dentition Vision: Impaired for self-feeding Self-Feeding Abilities: Total assist Patient Positioning: Upright in bed Baseline Vocal Quality: Normal Volitional Cough: Cognitively unable to elicit Volitional Swallow: Unable to elicit    Oral/Motor/Sensory Function Overall Oral Motor/Sensory Function: Within functional limits   Ice Chips     Thin Liquid Thin Liquid: Impaired Presentation: Straw;Cup Oral Phase Impairments: Poor awareness of bolus Pharyngeal  Phase Impairments: Cough - Immediate    Nectar Thick Nectar Thick Liquid: Not tested   Honey Thick Honey Thick Liquid: Not tested   Puree Puree: Impaired Presentation: Spoon Oral Phase Impairments: Poor awareness of bolus   Solid     Solid: Not tested      Chanelle Hodsdon, Riley Nearing 10/31/2021,1:11 PM

## 2021-10-31 NOTE — Progress Notes (Signed)
  Transition of Care [facility]) Screening Note   Patient Details  Name: Matthew [name] Date of Birth: [birth year]   [facility] CM/SW Contact:    Harriet Masson, RN Phone Number: 10/31/2021, 4:09 PM    Transition of Care Department [facility]) has reviewed patient and no TOC needs have been identified at this time. We will continue to monitor patient advancement through interdisciplinary progression rounds. If new patient transition needs arise, please place a TOC consult.

## 2021-10-31 NOTE — ED Notes (Signed)
Date and time results received: 10/31/21 10:29 AM    Test: Lithium  Critical Value: 2.01  Name of Provider Notified: Susa Raring MD

## 2021-10-31 NOTE — Consult Note (Signed)
Neurology Consultation  Reason for Consult: Seizure Disorder Referring Physician: Thurnell Lose, MD  CC: AMS  History is obtained from:Wife  HPI: Matthew [name] is a 66 y.o. male with significant past medical history significant for seizures,bipolar disorder ,HTN, migraines substance abuse presented to [facility] with altered mental status. Patient initially admitted to [facility] from 03/07-03/12/2021 for confusion and aphasia. At the time he was evaluated for code stroke and given TNK. His stroke work up included MRI Brain and continuous EEG however MRI revealed no acute infarction and EEG revealed epileptogenic with cortical dysfunction from left frontal temporal region. At the time his home Lamictal dosage was changed to 200 mg q 12 hours. Patient was found by his wife being confused, paranoid and has no interest in his usual activities. She also reports that he is seeing his psychiatrist of 30 years who practices in [place] , who recently added Depakote with addition of Seroquel. She states he stopped taking Seroquel due to nausea "and not feeling well" She usually passes his medication and he is aware of every pill and has been compliant. She reports he has been acting unusual where he was placing dishwashing liquid in the dishwater and at one point locked there CAT in the car. He was having difficulty with feeding himself and using proper utensils. His work up in ED includes CT Head non contrast which is negative and mild hypokalemia , AKI with creatinine of 1.78 and  supra therapeutic Lithium Level @  2.50 mmol/L  Patients wife states that she does see some improvement of his mental status since admission. He has been able to communicate his needs and still not at base line   Past Medical History:  Diagnosis Date   Anxiety    on meds   AVM (arteriovenous malformation) of colon 02/04/2021   Bipolar I disorder, most recent episode (or current) mixed, moderate    GERD (gastroesophageal reflux  disease)    Hypertension    on meds   Migraines    OSA (obstructive sleep apnea)    retested-bipap not needed   Panic attacks    Seizures ([place])    last known 05/2020;   Substance abuse ([place])      Family History  Problem Relation Age of Onset   Depression Mother    Hypertension Father    Cancer Father 75       brain tumor   Heart disease Paternal Grandmother    Arthritis Other    Hypertension Other    Colon polyps Neg Hx    Colon cancer Neg Hx    Esophageal cancer Neg Hx    Rectal cancer Neg Hx    Stomach cancer Neg Hx      Social History:   reports that he has quit smoking. His smoking use included pipe and cigars. He has never used smokeless tobacco. He reports that he does not drink alcohol and does not use drugs.  Medications  Current Facility-Administered Medications:    carvedilol (COREG) tablet 3.125 mg, 3.125 mg, Oral, BID WC, Lala Lund K, MD, 3.125 mg at 10/31/21 0929   enoxaparin (LOVENOX) injection 40 mg, 40 mg, Subcutaneous, Daily, Tu, Ching T, DO, 40 mg at 10/31/21 0929   famotidine (PEPCID) tablet 10 mg, 10 mg, Oral, Daily, Thurnell Lose, MD, 10 mg at 10/31/21 4098   lactated ringers infusion, , Intravenous, Continuous, Thurnell Lose, MD, Last Rate: 100 mL/hr at 10/31/21 0931, New Bag at 10/31/21 0931   potassium chloride  SA (KLOR-CON M) CR tablet 40 mEq, 40 mEq, Oral, Once, Azucena Cecil, PA-C  Current Outpatient Medications:    amLODipine (NORVASC) 5 MG tablet, Take 5 mg by mouth daily., Disp: , Rfl:    benazepril (LOTENSIN) 20 MG tablet, Take 20 mg by mouth daily., Disp: , Rfl:    Calcium Carbonate Antacid (TUMS PO), Take 1 tablet by mouth daily as needed (heartburn)., Disp: , Rfl:    carvedilol (COREG) 25 MG tablet, TAKE 1 TABLET BY MOUTH TWICE DAILY WITH A MEAL (Patient taking differently: Take 25 mg by mouth 2 (two) times daily with a meal.), Disp: 180 tablet, Rfl: 1   Cholecalciferol (VITAMIN D3) 250 MCG (10000 UT) capsule, Take  10,000 Units by mouth once a week., Disp: , Rfl:    clonazePAM (KLONOPIN) 0.5 MG tablet, Take 0.25 mg by mouth daily as needed for anxiety., Disp: , Rfl:    divalproex (DEPAKOTE ER) 500 MG 24 hr tablet, Take 500 mg by mouth in the morning and at bedtime., Disp: , Rfl:    HYDROcodone-acetaminophen (NORCO) 7.5-325 MG tablet, Take 0.5 tablets by mouth daily as needed (pain)., Disp: , Rfl:    ibuprofen (ADVIL) 200 MG tablet, Take 400 mg by mouth as needed (pain)., Disp: , Rfl:    lamoTRIgine (LAMICTAL) 150 MG tablet, Take 150 mg by mouth 2 (two) times daily., Disp: , Rfl:    lithium carbonate 300 MG capsule, Take 300-600 mg by mouth See admin instructions. Take 300 mg by mouth in the morning and 600 mg by mouth at bedtime, Disp: , Rfl:    QUEtiapine (SEROQUEL) 400 MG tablet, Take 400 mg by mouth at bedtime., Disp: , Rfl:    rOPINIRole (REQUIP) 1 MG tablet, Take 1 mg by mouth at bedtime., Disp: , Rfl:    topiramate (TOPAMAX) 200 MG tablet, Take 200 mg by mouth at bedtime., Disp: , Rfl:    zaleplon (SONATA) 10 MG capsule, Take 10 mg by mouth at bedtime as needed for sleep., Disp: , Rfl:    lamoTRIgine (LAMICTAL) 200 MG tablet, Take 1 tablet (200 mg total) by mouth 2 (two) times daily. (Patient not taking: Reported on 10/31/2021), Disp: 60 tablet, Rfl: 1   Exam: Current vital signs: BP 140/85   Pulse (!) 58   Temp 98.4 F (36.9 C) (Oral)   Resp 19   Ht 5' 5"  (1.651 m)   Wt 60 kg   SpO2 98%   BMI 22.01 kg/m  Vital signs in last 24 hours: Temp:  [97.8 F (36.6 C)-98.8 F (37.1 C)] 98.4 F (36.9 C) (07/03 0456) Pulse Rate:  [51-59] 58 (07/03 0930) Resp:  [12-22] 19 (07/03 0930) BP: (90-155)/(53-89) 140/85 (07/03 0930) SpO2:  [93 %-100 %] 98 % (07/03 0930) Weight:  [60 kg] 60 kg (07/02 1642)  GENERAL:  Thin appearing elderly male ,Awake, alert in NAD HEENT: - Normocephalic and atraumatic LUNGS - Clear to auscultation bilaterally with no wheezes CV - S1S2 RRR, no m/r/g, equal pulses  bilaterally. ABDOMEN - Soft, nontender, nondistended with normoactive BS Ext: warm, well perfused, intact peripheral pulses, no edema  NEURO:  Mental Status: AA&Ox2 (person /place ) not year. He is pleasant and will answer few questions. He mostly giggles or cries during interview and examination. He did follow few simple commands  Language: speech is normal. Unable to assess Naming, repetition, fluency, and comprehension.. Cranial Nerves: PERRL 104m reactive bilateral, EOMI Intact, UTA visual fields full, no facial asymmetry, facial sensation intact, hearing  intact, tongue/uvula/soft palate midline, normal sternocleidomastoid and trapezius muscle strength. No evidence of tongue atrophy or fibrillations Motor:  Tone: is normal and bulk is normal Sensation- Intact to light touch bilaterally Coordination: UTA due to his inattention and giggling  Gait- deferred   Labs I have reviewed labs in epic and the results pertinent to this consultation are:  CBC    Component Value Date/Time   WBC 7.1 10/31/2021 0338   RBC 3.60 (L) 10/31/2021 0338   HGB 10.9 (L) 10/31/2021 0338   HCT 32.8 (L) 10/31/2021 0338   PLT 190 10/31/2021 0338   MCV 91.1 10/31/2021 0338   MCH 30.3 10/31/2021 0338   MCHC 33.2 10/31/2021 0338   RDW 11.7 10/31/2021 0338   LYMPHSABS 1.0 07/05/2021 1732   MONOABS 0.4 07/05/2021 1732   EOSABS 0.1 07/05/2021 1732   BASOSABS 0.1 07/05/2021 1732    CMP     Component Value Date/Time   NA 144 10/31/2021 0920   NA 141 02/10/2021 1040   K 3.5 10/31/2021 0920   CL 116 (H) 10/31/2021 0920   CO2 18 (L) 10/31/2021 0920   GLUCOSE 76 10/31/2021 0920   GLUCOSE 144 (H) 05/11/2006 0818   BUN 30 (H) 10/31/2021 0920   BUN 13 02/10/2021 1040   CREATININE 1.40 (H) 10/31/2021 0920   CALCIUM 9.7 10/31/2021 0920   PROT 5.5 (L) 10/30/2021 1720   PROT 6.3 02/10/2021 1040   ALBUMIN 3.7 10/30/2021 1720   ALBUMIN 4.5 02/10/2021 1040   AST 15 10/30/2021 1720   ALT 13 10/30/2021 1720    ALKPHOS 49 10/30/2021 1720   BILITOT 0.8 10/30/2021 1720   BILITOT 0.4 02/10/2021 1040   GFRNONAA 56 (L) 10/31/2021 0920   GFRAA  06/09/2009 1707    >60        The eGFR has been calculated using the MDRD equation. This calculation has not been validated in all clinical situations. eGFR's persistently <60 mL/min signify possible Chronic Kidney Disease.    Lipid Panel     Component Value Date/Time   CHOL 122 07/07/2021 0228   TRIG 67 07/07/2021 0228   HDL 34 (L) 07/07/2021 0228   CHOLHDL 3.6 07/07/2021 0228   VLDL 13 07/07/2021 0228   LDLCALC 75 07/07/2021 0228     Imaging I have reviewed the images obtained:  CT-head : 10/30/21 COMPARISON:  Head CT 07/07/2021, brain MRI 07/06/2021   FINDINGS: Brain: No intracranial hemorrhage, mass effect, or midline shift. No hydrocephalus. The basilar cisterns are patent. No evidence of territorial infarct or acute ischemia. No extra-axial or intracranial fluid collection.   Vascular: Atherosclerosis of skullbase vasculature without hyperdense vessel or abnormal calcification.   Skull: No fracture or focal lesion.   Sinuses/Orbits: Minimal opacification of a posterior right ethmoid air cell. No mastoid effusion. Unremarkable appearance to the orbits.   Other: None.   IMPRESSION: Unremarkable noncontrast head CT. No acute intracranial abnormality.  MRI examination of the brain: 10/31/21/   EXAM: MRI HEAD WITHOUT CONTRAST   TECHNIQUE: Multiplanar, multiecho pulse sequences of the brain and surrounding structures were obtained without intravenous contrast.   COMPARISON:  Prior CT from 10/30/2021 as well as previous MRI from 07/06/2021.   FINDINGS: Brain: Examination technically limited as the patient was unable to tolerate the full length of the study. Diffusion-weighted sequences, axial T2 and FLAIR sequences, and sagittal T1 weighted sequences only were performed. Additionally, images are  intermittently moderately to severely degraded by motion artifact.   Cerebral volume within normal  limits. No visible significant cerebral white matter disease for age. No abnormal foci of restricted diffusion to suggest acute or subacute ischemia. Tiny remote right cerebellar infarct noted (series 10, image 2). No other visible areas of chronic cortical infarction.   No visible mass lesion, mass effect or midline shift. Ventricles normal size without hydrocephalus. No extra-axial fluid collection.   Vascular: Major intracranial vascular flow voids are grossly maintained at the skull base.   Skull and upper cervical spine: Craniocervical junction not well assessed on this limited exam. Bone marrow signal intensity grossly within normal limits. No visible soft tissue abnormality.   Sinuses/Orbits: Globes orbital soft tissues grossly within normal limits. Scattered mucosal thickening noted about the ethmoidal air cells. Superimposed right posterior ethmoidal sinus mucocele versus retention cyst. No significant mastoid effusion.   Other: None.   IMPRESSION: 1. Technically limited exam due to the patient's inability to tolerate the full length of the study and extensive motion artifact. 2. Grossly negative brain MRI. No acute intracranial infarct. No other definite acute intracranial abnormality. 3. Tiny remote right cerebellar infarct.  Assessment:  Altered mental Status Polypharmacy Seizure Disorder Bipolar Disorder Migraines  AKI Hypertension     Impression: This is a 66 yo male with extensive psychiatric disorder and seizure disorder. Patient now reported by his wife with 1- 2 weeks of progressive confusion falling and weakness. Patient admitted and current brain imaging is negative for any acute pathology. Patients recent encephalopathy is most likely due to polypharmacy and Lithium toxicity  Recommendations: Neuro checks q 4 hours IV Hydration per primary  team Discontinue Valproic Acid . Check daily valproic acid levels until it clears Continue Lamictal 150 twice daily for now, Increase Lamictal dosage to 200 mg q 12 hours, once valproic acid has been cleared.   -- Elenora Gamma , PA-C Neurologist [facility]  Attending attestation    I have seen the patient and reviewed the above note.  He is encephalopathic, with a significantly elevated lithium level which I feel is consistent with this presentation.  He has had several other changes recently including recent started on Depakote, with the plan to reduce his Lamictal that the patient continue taking his regular dose.  With Depakote significantly slowing the metabolism of Lamictal, it is possible that Lamictal toxicity is also playing some role.    At this time, given his history of presentation consistent with seizures on his current dose of Lamictal I would not favor decreasing his antiepileptics, and after a week or so would increase back to the 200 twice daily.  He is also on topiramate, which also acts as an antiepileptic.  Depakote and Lamictal are sometimes difficult to titrate, but I think that the original plan to decrease his Lamictal by half was reasonable.  In the short-term, given that he is significantly encephalopathic due to multiple recent medication changes, I would favor limiting changes until he is no longer encephalopathic, and then adjusting his medications gradually.  If he were to have breakthrough seizures in the setting, then we would need to adjust the timeframe.   Please note that lithium is cleared from the CNS more slowly than from the blood and therefore his encephalopathy may persist for period of time after his blood levels normalize.  Roland Rack, MD [facility] [phone]  If 7pm- 7am, please page neurology on call as listed in [place].

## 2021-10-31 NOTE — Progress Notes (Signed)
Date and time results received: 10/31/21 17:45  Test: Lithium Critical Value: 1.75  Name of Provider Notified: Thedore Mins

## 2021-10-31 NOTE — Progress Notes (Addendum)
PROGRESS NOTE                                                                                                                                                                                                             Patient Demographics:    Matthew [name], is a 66 y.o. male, DOB - 03-25-1956, MWN:[id number]  Outpatient Primary MD for the patient is Matthew Pilon, FNP    LOS - 0  Admit date - 10/30/2021    Chief Complaint  Patient presents with   Abdominal Pain       Brief Narrative (HPI from H&P)   66 y.o. male with medical history significant of seizure, Bipolar disorder, HTN, migraines, substance abuse who presents with altered mental status.  He was noted to be acting confused by his wife, recently his psychiatrist in [place] had made multiple changes to his home medications, of note few months ago he was admitted to [facility] for possible seizure-like activity and at that time his Lamictal dose was increased.  Currently he is on multiple neurocognitive and psych medications with evidence of lithium toxicity in the ER.  He has been admitted for toxic encephalopathy and to be evaluated by psychiatry and neurology.   Subjective:    Matthew [name] today has, No headache, No chest pain, No abdominal pain - No Nausea, No new weakness tingling or numbness, no shortness of breath.   Assessment  & Plan :    AMS (altered mental status) in a patient with bipolar disorder with polypharmacy causing toxic encephalopathy - Most likely from polypharmacy with underlying bipolar disorder/seizure. Pt has multiple neurocognitive medications on his list . On his medication list there is Lamictal, lithium, Depakote, Seroquel and as needed Sonata and Klonopin.  Lithium levels are elevated in toxic range, for which she is getting IV fluids, will monitor lithium levels renal function closely.  CT head and MRI brain nonacute,  mentation is improving, currently no focal deficits.  Will involve psychiatry, monitor closely.  History of seizures.  Multiple neurocognitive and psych medications, recent admission for possible seizure-like activity done by neurology.  DW neurology - continue  Depakote, Lamictal and Topamax .  For now hold EEG.  Hypokalemia - replaced.  Enlarged prostate - Incidental finding on CT abdomen/pelvis.  PSA obtained by ED PA within normal limits.  Wife is not aware of any urinary symptoms.  Outpatient urology follow-up to be arranged by PCP.  AKI (acute kidney injury) ([place])  - Creatinine elevated 1.78 from a prior of 1.01, unfortunately he had some exposure to IV contrast in the ER, also has lithium toxicity, being hydrated renal function has stabilized and gradually improving, continue to monitor renal function closely with IV fluids.  Will check urine electrolytes and renal ultrasound as well.  Essential hypertension - placed on low-dose Coreg will monitor, 1/4 home dose.   4.30 pm - few episodes of asymptomatic bradycardia, transient, Tele - 39-45/min, stable BP, lithium level down to 2 from 2.5, DC coreg, Glucagon IV, check TSH, EKG, repeat Lithium level, IVF, Tele monitor. DW Cards Matthew [name].      Condition - Extremely Guarded  Family Communication  :   Called wife Matthew [name] [phone] update 10/31/2021   Code Status :  Full  Consults  :  Psych, neuro  PUD Prophylaxis : pepcid   Procedures  :     MRI Brain - Non acute  CT Abd Pelvis with IV contrast done in the ER - non acute, large prostate.  Renal ultrasound.      Disposition Plan  :    Status is: Observation  DVT Prophylaxis  :    enoxaparin (LOVENOX) injection 40 mg Start: 10/31/21 1000    Lab Results  Component Value Date   PLT 190 10/31/2021    Diet :  Diet Order             DIET SOFT Room service appropriate? Yes; Fluid consistency: Thin  Diet effective now                    Inpatient  Medications  Scheduled Meds:  carvedilol  3.125 mg Oral BID WC   enoxaparin (LOVENOX) injection  40 mg Subcutaneous Daily   potassium chloride  40 mEq Oral Once   Continuous Infusions:  lactated ringers     PRN Meds:.  Antibiotics  :    Anti-infectives (From admission, onward)    None        Time Spent in minutes  30   Matthew [name] M.D on 10/31/2021 at 8:36 AM  To page go to [email or web address]   [facility] -  Office  [phone]  See all Orders from today for further details    Objective:   Vitals:   10/31/21 0430 10/31/21 0456 10/31/21 0615 10/31/21 0630  BP: 106/73  118/89 112/66  Pulse: (!) 52  (!) 57 (!) 58  Resp: 12  20 17   Temp:  98.4 F (36.9 C)    TempSrc:  Oral    SpO2: 99%  100% 100%  Weight:      Height:        Wt Readings from Last 3 Encounters:  10/30/21 60 kg  07/14/21 67.1 kg  07/05/21 71.9 kg     Intake/Output Summary (Last 24 hours) at 10/31/2021 0836 Last data filed at 10/31/2021 0830 Gross per 24 hour  Intake --  Output 720 ml  Net -720 ml     Physical Exam  Awake Alert but tearful, No new F.N deficits, depressed affect [name].AT,PERRAL Supple Neck, No JVD,   Symmetrical Chest wall movement, Good air movement bilaterally, CTAB RRR,No Gallops,Rubs or new Murmurs,  +ve B.Sounds, Abd Soft, No tenderness,   No Cyanosis, Clubbing or edema       Data Review:    CBC Recent Labs  Lab 10/31/21 0338  WBC 7.1  HGB 10.9*  HCT 32.8*  PLT 190  MCV 91.1  MCH 30.3  MCHC 33.2  RDW 11.7    Electrolytes Recent Labs  Lab 10/30/21 1720 10/31/21 0047 10/31/21 0338  NA 138  --  141  K 3.1*  --  3.2*  CL 112*  --  113*  CO2 21*  --  20*  GLUCOSE 86  --  79  BUN 42*  --  35*  CREATININE 1.78*  --  1.61*  CALCIUM 9.6  --  9.3  AST 15  --   --   ALT 13  --   --   ALKPHOS 49  --   --   BILITOT 0.8  --   --   ALBUMIN 3.7  --   --   MG  --  2.3  --   INR 1.1  --   --   TSH  --  1.207  --      ------------------------------------------------------------------------------------------------------------------ No results for input(s): "CHOL", "HDL", "LDLCALC", "TRIG", "CHOLHDL", "LDLDIRECT" in the last 72 hours.  Lab Results  Component Value Date   HGBA1C 4.6 (L) 07/07/2021    Recent Labs    10/31/21 0047  TSH 1.207   ------------------------------------------------------------------------------------------------------------------ ID Labs Recent Labs  Lab 10/30/21 1720 10/31/21 0338  WBC  --  7.1  PLT  --  190  CREATININE 1.78* 1.61*   Cardiac Enzymes No results for input(s): "CKMB", "TROPONINI", "MYOGLOBIN" in the last 168 hours.  Invalid input(s): "CK"   Radiology Reports MR BRAIN WO CONTRAST  Result Date: 10/31/2021 CLINICAL DATA:  Initial evaluation for acute mental status change. EXAM: MRI HEAD WITHOUT CONTRAST TECHNIQUE: Multiplanar, multiecho pulse sequences of the brain and surrounding structures were obtained without intravenous contrast. COMPARISON:  Prior CT from 10/30/2021 as well as previous MRI from 07/06/2021. FINDINGS: Brain: Examination technically limited as the patient was unable to tolerate the full length of the study. Diffusion-weighted sequences, axial T2 and FLAIR sequences, and sagittal T1 weighted sequences only were performed. Additionally, images are intermittently moderately to severely degraded by motion artifact. Cerebral volume within normal limits. No visible significant cerebral white matter disease for age. No abnormal foci of restricted diffusion to suggest acute or subacute ischemia. Tiny remote right cerebellar infarct noted (series 10, image 2). No other visible areas of chronic cortical infarction. No visible mass lesion, mass effect or midline shift. Ventricles normal size without hydrocephalus. No extra-axial fluid collection. Vascular: Major intracranial vascular flow voids are grossly maintained at the skull base. Skull and upper  cervical spine: Craniocervical junction not well assessed on this limited exam. Bone marrow signal intensity grossly within normal limits. No visible soft tissue abnormality. Sinuses/Orbits: Globes orbital soft tissues grossly within normal limits. Scattered mucosal thickening noted about the ethmoidal air cells. Superimposed right posterior ethmoidal sinus mucocele versus retention cyst. No significant mastoid effusion. Other: None. IMPRESSION: 1. Technically limited exam due to the patient's inability to tolerate the full length of the study and extensive motion artifact. 2. Grossly negative brain MRI. No acute intracranial infarct. No other definite acute intracranial abnormality. 3. Tiny remote right cerebellar infarct. Electronically Signed   By: Rise Mu M.D.   On: 10/31/2021 02:14   CT ABDOMEN PELVIS W CONTRAST  Result Date: 10/30/2021 CLINICAL DATA:  Abdominal pain. EXAM: CT ABDOMEN AND PELVIS WITH CONTRAST TECHNIQUE: Multidetector CT imaging of the abdomen and pelvis was performed using the standard protocol following bolus administration of intravenous  contrast. RADIATION DOSE REDUCTION: This exam was performed according to the departmental dose-optimization program which includes automated exposure control, adjustment of the mA and/or kV according to patient size and/or use of iterative reconstruction technique. CONTRAST:  38mL OMNIPAQUE IOHEXOL 300 MG/ML  SOLN COMPARISON:  None Available. FINDINGS: Lower chest: Mild atelectasis is seen within the posterior aspect of the bilateral lung bases. Hepatobiliary: No focal liver abnormality is seen. No gallstones, gallbladder wall thickening, or biliary dilatation. Pancreas: Unremarkable. No pancreatic ductal dilatation or surrounding inflammatory changes. Spleen: Normal in size without focal abnormality. Adrenals/Urinary Tract: Adrenal glands are unremarkable. Kidneys are normal in size, without renal calculi or hydronephrosis. A 6 mm cystic  appearing area is seen within the medial aspect of the mid to lower right kidney. Bladder is unremarkable. Stomach/Bowel: Stomach is within normal limits. Appendix appears normal (best seen on coronal reformatted images 60 through 88, CT series 7). No evidence of bowel wall thickening, distention, or inflammatory changes. Noninflamed diverticula are seen throughout the descending and sigmoid colon. Vascular/Lymphatic: Aortic atherosclerosis. No enlarged abdominal or pelvic lymph nodes. Reproductive: There is moderate to marked severity prostate gland enlargement. Other: No abdominal wall hernia or abnormality. No abdominopelvic ascites. Musculoskeletal: Degenerative changes are seen throughout the lumbar spine. IMPRESSION: 1. Colonic diverticulosis. 2. Moderate to marked severity prostate gland enlargement. Correlation with PSA values is recommended. Aortic Atherosclerosis (ICD10-I70.0). Electronically Signed   By: Virgina Norfolk M.D.   On: 10/30/2021 19:56   CT Head Wo Contrast  Result Date: 10/30/2021 CLINICAL DATA:  Altered mental status, unknown cause. EXAM: CT HEAD WITHOUT CONTRAST TECHNIQUE: Contiguous axial images were obtained from the base of the skull through the vertex without intravenous contrast. RADIATION DOSE REDUCTION: This exam was performed according to the departmental dose-optimization program which includes automated exposure control, adjustment of the mA and/or kV according to patient size and/or use of iterative reconstruction technique. COMPARISON:  Head CT 07/07/2021, brain MRI 07/06/2021 FINDINGS: Brain: No intracranial hemorrhage, mass effect, or midline shift. No hydrocephalus. The basilar cisterns are patent. No evidence of territorial infarct or acute ischemia. No extra-axial or intracranial fluid collection. Vascular: Atherosclerosis of skullbase vasculature without hyperdense vessel or abnormal calcification. Skull: No fracture or focal lesion. Sinuses/Orbits: Minimal  opacification of a posterior right ethmoid air cell. No mastoid effusion. Unremarkable appearance to the orbits. Other: None. IMPRESSION: Unremarkable noncontrast head CT. No acute intracranial abnormality. Electronically Signed   By: Keith Rake M.D.   On: 10/30/2021 19:55

## 2021-10-31 NOTE — Evaluation (Signed)
Physical Therapy Evaluation Patient Details Name: Matthew [name] MRN: [id number] DOB: 1955/06/25 Today's Date: 10/31/2021  History of Present Illness  66 yo male was admitted on 7/2 for AMS with disorientation and highly unusual behaviors including hallucinations and sleepiness.  Pt has been getting medications from a doctor that his wife did not know about, and has resulted in unsafe events including falling and potentially injuring the pets.  Pt is admitted as a potential stroke but then cleared, now found to have seizure activity.  Also found in his system opioids, THC, and has AKI.  PMHx:  bipolar disorder, HTN, migraines, substance abuse, Seizures, AVM of colon, OSA, panic attacks  Clinical Impression  Pt was seen for evaluation of movement, with really limited PLOF available at the time of pt contact.  Pt is alternately crying and then trying to move, with eyes closed most of the session.  PT asked pt if he could keep eyes open and he shakes his head no.  Will work on increasing his mobility with an AD and to improve independence for a transition to a rehab setting.  Pt is not currently near his apparent baseline of independent gait on No AD.  Follow acutely for this recovery of safety and independence, focusing on his awareness of his surroundings and engagement with staff to increase his investment of effort.  Acute PT goals are outlined below.       Recommendations for follow up therapy are one component of a multi-disciplinary discharge planning process, led by the attending physician.  Recommendations may be updated based on patient status, additional functional criteria and insurance authorization.  Follow Up Recommendations Skilled nursing-short term rehab (<3 hours/day) Can patient physically be transported by private vehicle: No    Assistance Recommended at Discharge Frequent or constant Supervision/Assistance  Patient can return home with the following  A lot of help with walking  and/or transfers;A lot of help with bathing/dressing/bathroom;Assistance with cooking/housework;Assistance with feeding;Direct supervision/assist for medications management;Direct supervision/assist for financial management;Assist for transportation;Help with stairs or ramp for entrance    Equipment Recommendations None recommended by PT  Recommendations for Other Services       Functional Status Assessment Patient has had a recent decline in their functional status and demonstrates the ability to make significant improvements in function in a reasonable and predictable amount of time.     Precautions / Restrictions Precautions Precautions: Fall Precaution Comments: fairly non verbal currently Restrictions Weight Bearing Restrictions: No      Mobility  Bed Mobility Overal bed mobility: Needs Assistance Bed Mobility: Sidelying to Sit, Sit to Sidelying, Rolling Rolling: Mod assist Sidelying to sit: Max assist     Sit to sidelying: Max assist General bed mobility comments: pt is not able to follow directions to sequence the transition up and back to gurney    Transfers Overall transfer level: Needs assistance Equipment used: 1 person hand held assist Transfers: Sit to/from Stand Sit to Stand: Mod assist           General transfer comment: mod to support and sequence, with pt reaching to the sides to stand, does not seem to understand even cued that support to stand is not lateral but with from pushing toward the floor with hand support    Ambulation/Gait Ambulation/Gait assistance: Min assist, Mod assist Gait Distance (Feet): 4 Feet Assistive device: 1 person hand held assist Gait Pattern/deviations: Step-to pattern, Decreased stride length Gait velocity: reduced Gait velocity interpretation: <1.31 ft/sec, indicative of household ambulator  Pre-gait activities: standing balance ck and cues for normalizing WB on UE's General Gait Details: pt essentially only sidestepped  next to bed due to safety and his comprehension and return on demonstration to take steps with UE support  Stairs            Wheelchair Mobility    Modified Rankin (Stroke Patients Only)       Balance Overall balance assessment: Needs assistance, History of Falls Sitting-balance support: Feet supported Sitting balance-Leahy Scale: Fair Sitting balance - Comments: fair once set Postural control: Posterior lean, Right lateral lean Standing balance support: During functional activity, Bilateral upper extremity supported Standing balance-Leahy Scale: Poor Standing balance comment: pt does not follow cues and tries to hold PT arms out laterally                             Pertinent Vitals/Pain Pain Assessment Pain Assessment: Faces Faces Pain Scale: No hurt    Home Living Family/patient expects to be discharged to:: Unsure                   Additional Comments: wife was not present when PTsaw pt    Prior Function Prior Level of Function : Independent/Modified Independent             Mobility Comments: no AD needed previously       Hand Dominance   Dominant Hand: Right    Extremity/Trunk Assessment   Upper Extremity Assessment Upper Extremity Assessment: Difficult to assess due to impaired cognition    Lower Extremity Assessment Lower Extremity Assessment: Difficult to assess due to impaired cognition    Cervical / Trunk Assessment Cervical / Trunk Assessment: Kyphotic (mild)  Communication   Communication: Expressive difficulties (fairly non verbal)  Cognition Arousal/Alertness: Lethargic Behavior During Therapy: Anxious, Restless Overall Cognitive Status: No family/caregiver present to determine baseline cognitive functioning                                 General Comments: unclear how this presentation compares to baseline        General Comments General comments (skin integrity, edema, etc.): pt is very  lethargic with eyes closed alternating with crying sounds, very difficult to move him with his clear distress    Exercises     Assessment/Plan    PT Assessment Patient needs continued PT services  PT Problem List Decreased activity tolerance;Decreased cognition;Decreased safety awareness;Decreased balance;Decreased mobility       PT Treatment Interventions DME instruction;Gait training;Functional mobility training;Therapeutic activities;Therapeutic exercise;Balance training;Neuromuscular re-education;Patient/family education    PT Goals (Current goals can be found in the Care Plan section)  Acute Rehab PT Goals Patient Stated Goal: none stated PT Goal Formulation: Patient unable to participate in goal setting Time For Goal Achievement: 11/14/21 Potential to Achieve Goals: Fair    Frequency Min 3X/week     Co-evaluation               AM-PAC PT "6 Clicks" Mobility  Outcome Measure Help needed turning from your back to your side while in a flat bed without using bedrails?: None Help needed moving from lying on your back to sitting on the side of a flat bed without using bedrails?: A Little Help needed moving to and from a bed to a chair (including a wheelchair)?: A Lot Help needed standing up from a chair using your  arms (e.g., wheelchair or bedside chair)?: A Lot Help needed to walk in hospital room?: A Lot Help needed climbing 3-5 steps with a railing? : Total 6 Click Score: 14    End of Session   Activity Tolerance: Patient limited by lethargy;Treatment limited secondary to agitation Patient left: in bed;with call bell/phone within reach Nurse Communication: Mobility status PT Visit Diagnosis: Unsteadiness on feet (R26.81);History of falling (Z91.81);Difficulty in walking, not elsewhere classified (R26.2)    Time: 7048-8891 PT Time Calculation (min) (ACUTE ONLY): 16 min   Charges:   PT Evaluation $PT Eval Moderate Complexity: 1 Mod         Ivar Drape 10/31/2021, 5:27 PM  Samul Dada, PT PhD Acute Rehab Dept. Number: [facility] [id number] and [facility] [phone]

## 2021-10-31 NOTE — ED Notes (Signed)
Shalhoub MD paged regarding pts BP 90/53

## 2021-10-31 NOTE — Progress Notes (Signed)
HOSPITAL MEDICINE OVERNIGHT EVENT NOTE    Notified by nursing concerning patient's markedly elevated lithium level of 2.5.  Chart reviewed, patient is currently hospitalized for encephalopathy felt to be secondary to polypharmacy including possible lithium overdose.  All of patient's psychotropic medications have been held at this time.  Patient notably is suffering from some degree of renal injury with last creatinine being 1.78.  There is no evidence of concurrent hypercalcemia which can be seen in lithium overdose.  Nursing reports the patient is currently arousable and patient has not exhibited any seizure activity.  We will increase rate of intravenous fluids to 125 cc an hour.  We will obtain a thyroid panel and follow repeat renal function panel this morning.  We will additionally order a repeat lithium level to be obtained later in the day.  Continuing close clinical monitoring.  Continuing to hold all psychotropic medications.  Marinda Elk  MD [facility]

## 2021-10-31 NOTE — Progress Notes (Signed)
Pt HR brady to 27.  Pt vomited after SB episode.  Clear yellow emesis.  BP obtained - 157/72.  MD notified.  Will continue to monitor.

## 2021-10-31 NOTE — ED Notes (Signed)
Patient transported to MRI 

## 2021-10-31 NOTE — Progress Notes (Signed)
[name] heart rate was in between 30s to50s.MD informed and order followed.

## 2021-10-31 NOTE — ED Notes (Signed)
Pt found attempting to get up from recliner. Pt disoriented x4, unable to follow most commands. This RN repositioned pt in recliner. Warm blanket given. Respirations even and unlabored, NAD noted

## 2021-11-01 ENCOUNTER — Inpatient Hospital Stay (HOSPITAL_COMMUNITY): Payer: Medicare Other

## 2021-11-01 DIAGNOSIS — R4182 Altered mental status, unspecified: Secondary | ICD-10-CM | POA: Diagnosis not present

## 2021-11-01 DIAGNOSIS — N179 Acute kidney failure, unspecified: Secondary | ICD-10-CM | POA: Diagnosis not present

## 2021-11-01 LAB — COMPREHENSIVE METABOLIC PANEL
ALT: 13 U/L (ref 0–44)
AST: 15 U/L (ref 15–41)
Albumin: 3.2 g/dL — ABNORMAL LOW (ref 3.5–5.0)
Alkaline Phosphatase: 48 U/L (ref 38–126)
Anion gap: 7 (ref 5–15)
BUN: 19 mg/dL (ref 8–23)
CO2: 20 mmol/L — ABNORMAL LOW (ref 22–32)
Calcium: 9.3 mg/dL (ref 8.9–10.3)
Chloride: 113 mmol/L — ABNORMAL HIGH (ref 98–111)
Creatinine, Ser: 1.17 mg/dL (ref 0.61–1.24)
GFR, Estimated: >60 mL/min (ref >60)
Glucose, Bld: 121 mg/dL — ABNORMAL HIGH (ref 70–99)
Potassium: 3.3 mmol/L — ABNORMAL LOW (ref 3.5–5.1)
Sodium: 140 mmol/L (ref 135–145)
Total Bilirubin: 1 mg/dL (ref 0.3–1.2)
Total Protein: 4.9 g/dL — ABNORMAL LOW (ref 6.5–8.1)

## 2021-11-01 LAB — BRAIN NATRIURETIC PEPTIDE: B Natriuretic Peptide: 979.3 pg/mL — ABNORMAL HIGH (ref 0.0–100.0)

## 2021-11-01 LAB — CBC WITH DIFFERENTIAL/PLATELET
Abs Immature Granulocytes: 0.03 10*3/uL (ref 0.00–0.07)
Basophils Absolute: 0 10*3/uL (ref 0.0–0.1)
Basophils Relative: 0 %
Eosinophils Absolute: 0.3 10*3/uL (ref 0.0–0.5)
Eosinophils Relative: 3 %
HCT: 31.8 % — ABNORMAL LOW (ref 39.0–52.0)
Hemoglobin: 10.9 g/dL — ABNORMAL LOW (ref 13.0–17.0)
Immature Granulocytes: 0 %
Lymphocytes Relative: 16 %
Lymphs Abs: 1.5 10*3/uL (ref 0.7–4.0)
MCH: 30.7 pg (ref 26.0–34.0)
MCHC: 34.3 g/dL (ref 30.0–36.0)
MCV: 89.6 fL (ref 80.0–100.0)
Monocytes Absolute: 0.9 10*3/uL (ref 0.1–1.0)
Monocytes Relative: 10 %
Neutro Abs: 6.6 10*3/uL (ref 1.7–7.7)
Neutrophils Relative %: 71 %
Platelets: 193 10*3/uL (ref 150–400)
RBC: 3.55 MIL/uL — ABNORMAL LOW (ref 4.22–5.81)
RDW: 11.7 % (ref 11.5–15.5)
WBC: 9.4 10*3/uL (ref 4.0–10.5)
nRBC: 0 % (ref 0.0–0.2)

## 2021-11-01 LAB — LITHIUM LEVEL
Lithium Lvl: 1.32 mmol/L — ABNORMAL HIGH (ref 0.60–1.20)
Lithium Lvl: 1.43 mmol/L — ABNORMAL HIGH (ref 0.60–1.20)
Lithium Lvl: 1.06 mmol/L (ref 0.60–1.20)

## 2021-11-01 LAB — MAGNESIUM: Magnesium: 2 mg/dL (ref 1.7–2.4)

## 2021-11-01 LAB — T3: T3, Total: 64 ng/dL — ABNORMAL LOW (ref 71–180)

## 2021-11-01 MED ORDER — POTASSIUM CHLORIDE 20 MEQ PO PACK
40.0000 meq | PACK | Freq: Once | ORAL | Status: AC
  Administered 2021-11-01: 40 meq via ORAL
  Filled 2021-11-01: qty 2

## 2021-11-01 MED ORDER — POTASSIUM CL IN DEXTROSE 5% 20 MEQ/L IV SOLN
20.0000 meq | INTRAVENOUS | Status: DC
  Administered 2021-11-01: 20 meq via INTRAVENOUS
  Filled 2021-11-01: qty 1000

## 2021-11-01 MED ORDER — LACTATED RINGERS IV BOLUS
500.0000 mL | Freq: Once | INTRAVENOUS | Status: AC
  Administered 2021-11-01: 500 mL via INTRAVENOUS

## 2021-11-01 MED ORDER — POTASSIUM CL IN DEXTROSE 5% 20 MEQ/L IV SOLN
20.0000 meq | INTRAVENOUS | Status: DC
  Filled 2021-11-01: qty 1000

## 2021-11-01 MED ORDER — POTASSIUM CL IN DEXTROSE 5% 20 MEQ/L IV SOLN: 20.0000 meq | INTRAVENOUS | Status: DC

## 2021-11-01 MED ORDER — POTASSIUM CL IN DEXTROSE 5% 20 MEQ/L IV SOLN
20.0000 meq | INTRAVENOUS | Status: DC
  Administered 2021-11-02: 20 meq via INTRAVENOUS
  Filled 2021-11-01: qty 1000

## 2021-11-01 NOTE — Consult Note (Signed)
CARDIOLOGY CONSULT NOTE  Patient ID: Matthew [name] MRN: [id number] DOB/AGE: 1955-12-13 66 y.o.  Admit date: 10/30/2021 Referring Physician: [facility] hospitalist Reason for Consultation: Bradycardia  HPI:   66 y.o. Caucasian male  with hypertension, OSA, bipolar disorder, seizures, admitted with lithium toxicity.  Cardiology consulted for management of bradycardia.  History obtained primarily from wife at bedside.  Patient has been had longstanding history of bipolar disorder, as well as seizures.  He has had worsening mental health since his seizures in March 2023.  He was now admitted altered mental status.  Work-up showed elevated lithium levels, mildly elevated creatinine.  He is noted to have bradycardia throughout the hospitalization.  He denies any chest pain, shortness of breath, presyncope, syncope. Reviewed cardiac telemetry and EKG in detail, see below.  MRI brain was grossly negative for acute intracranial infarct or any intracranial abnormality.  Did show tiny remote right cerebellar infarct.  It appears that patient had been getting his psychiatric care in [place], and does not have any established psychiatrist here in town.  Past Medical History:  Diagnosis Date   Anxiety    on meds   AVM (arteriovenous malformation) of colon 02/04/2021   Bipolar I disorder, most recent episode (or current) mixed, moderate    GERD (gastroesophageal reflux disease)    Hypertension    on meds   Migraines    OSA (obstructive sleep apnea)    retested-bipap not needed   Panic attacks    Seizures (HCC)    last known 05/2020;   Substance abuse (HCC)      Past Surgical History:  Procedure Laterality Date   CARPOMETACARPEL ([facility]) FUSION OF THUMB WITH AUTOGRAFT FROM RADIUS Bilateral    COLONOSCOPY  2011   CG[facility]in pt at [facility]-   FINGER ARTHROPLASTY Left 06/17/2012   Procedure: FINGER ARTHROPLASTY;  Surgeon: Jodi Marble, MD;  [facility]: [facility];  Service: Orthopedics;   Laterality: Left;  left thumb suspension arthroplasty    HEMORRHOID SURGERY  2000   Dr Earlene Plater   TONSILLECTOMY     WISDOM TOOTH EXTRACTION        Family History  Problem Relation Age of Onset   Depression Mother    Hypertension Father    Cancer Father 52       brain tumor   Heart disease Paternal Grandmother    Arthritis Other    Hypertension Other    Colon polyps Neg Hx    Colon cancer Neg Hx    Esophageal cancer Neg Hx    Rectal cancer Neg Hx    Stomach cancer Neg Hx      Social History: Social History   Socioeconomic History   Marital status: Married    Spouse name: susan   Number of children: Not on file   Years of education: Not on file   Highest education level: Not on file  Occupational History   Occupation: Disability    Comment: Bipolar disorder  Tobacco Use   Smoking status: Former    Types: Pipe, Cigars   Smokeless tobacco: Never  Vaping Use   Vaping Use: Never used  Substance and Sexual Activity   Alcohol use: No   Drug use: No    Types: Cocaine    Comment: past history of use-   Sexual activity: Not on file  Other Topics Concern   Not on file  Social History Narrative   Lives with wife   Right Handed   Drinks 3-4 cups caffeine  daily      Social Determinants of Health   Financial Resource Strain: Not on file  Food Insecurity: Not on file  Transportation Needs: Not on file  Physical Activity: Not on file  Stress: Not on file  Social Connections: Not on file  Intimate Partner Violence: Not on file     Medications Prior to Admission  Medication Sig Dispense Refill Last Dose   amLODipine (NORVASC) 5 MG tablet Take 5 mg by mouth daily.   10/30/2021   benazepril (LOTENSIN) 20 MG tablet Take 20 mg by mouth daily.   10/30/2021   Calcium Carbonate Antacid (TUMS PO) Take 1 tablet by mouth daily as needed (heartburn).   Past Week   carvedilol (COREG) 25 MG tablet TAKE 1 TABLET BY MOUTH TWICE DAILY WITH A MEAL (Patient taking differently: Take 25 mg  by mouth 2 (two) times daily with a meal.) 180 tablet 1 10/30/2021 at 1000   Cholecalciferol (VITAMIN D3) 250 MCG (10000 UT) capsule Take 10,000 Units by mouth once a week.   10/24/2021   clonazePAM (KLONOPIN) 0.5 MG tablet Take 0.25 mg by mouth daily as needed for anxiety.   Past Week   divalproex (DEPAKOTE ER) 500 MG 24 hr tablet Take 500 mg by mouth in the morning and at bedtime.   10/22/2021   HYDROcodone-acetaminophen (NORCO) 7.5-325 MG tablet Take 0.5 tablets by mouth daily as needed (pain).   10/30/2021   ibuprofen (ADVIL) 200 MG tablet Take 400 mg by mouth as needed (pain).   unk   lamoTRIgine (LAMICTAL) 150 MG tablet Take 150 mg by mouth 2 (two) times daily.   10/30/2021   lithium carbonate 300 MG capsule Take 300-600 mg by mouth See admin instructions. Take 300 mg by mouth in the morning and 600 mg by mouth at bedtime   10/30/2021   QUEtiapine (SEROQUEL) 400 MG tablet Take 400 mg by mouth at bedtime.   10/22/2021   rOPINIRole (REQUIP) 1 MG tablet Take 1 mg by mouth at bedtime.   10/29/2021   topiramate (TOPAMAX) 200 MG tablet Take 200 mg by mouth at bedtime.   10/29/2021   zaleplon (SONATA) 10 MG capsule Take 10 mg by mouth at bedtime as needed for sleep.   10/25/2021   lamoTRIgine (LAMICTAL) 200 MG tablet Take 1 tablet (200 mg total) by mouth 2 (two) times daily. (Patient not taking: Reported on 10/31/2021) 60 tablet 1 Not Taking    Review of Systems  Unable to perform ROS: Mental status change  Cardiovascular:  Negative for chest pain and syncope.  Respiratory:  Negative for shortness of breath.       Physical Exam: Physical Exam Vitals and nursing note reviewed.  Constitutional:      General: He is not in acute distress. Neck:     Vascular: No JVD.  Cardiovascular:     Rate and Rhythm: Regular rhythm. Bradycardia present.     Heart sounds: Normal heart sounds. No murmur heard. Pulmonary:     Effort: Pulmonary effort is normal.     Breath sounds: Normal breath sounds. No wheezing or  rales.  Musculoskeletal:     Right lower leg: No edema.     Left lower leg: No edema.        Imaging/tests reviewed and independently interpreted: Lab Results: CBC, BMP, BNP  Cardiac Studies:  Telemetry 11/01/2021: Predominantly sinus bradycardia, as low as high 20s Rate now improving No high-grade AV block or sinus node dysfunction seen  EKG 11/01/2021:  Sinus bradycardia 47 bpm Nonspecific T wave abnormality  Echocardiogram: Ordered   Assessment & Recommendations:   66 y.o. Caucasian male  with hypertension, OSA, bipolar disorder, seizures, admitted with lithium toxicity.  Cardiology consulted for management of bradycardia.  Bradycardia: Asymptomatic bradycardia.   Normal TSH.  Not on any AV nodal blocking agents. Most likely due to lithium toxicity. Treatment essentially remains discontinuation of lithium.  Levels are slowly improving.  Heart rate is slowly increasing. We will continue cardiac telemetry monitoring, as you are doing. No indication for acute need for pacemaker. Also, does not need glucagon, or positive chronotropic agents. Given elevated BNP, although without any signs/symptoms of heart failure, I will obtain echocardiogram to rule out any structural cardiac abnormality.  Avoidance of lithium would be best option to prevent further bradycardia.  However, if lithium is required for appropriate care of his neurological and psychiatric issues, recommend lowest dose possible to avoid toxicity.  We will continue to follow the patient.  Discussed interpretation of tests and management recommendations with the primary team     Nigel Mormon, MD Pager: [phone] Office: [phone]

## 2021-11-01 NOTE — Consult Note (Shared)
Pt seen in AM. Wife is excited to see that psychiatry is here - hoping to find a psychiatrist here so he doesn't need to go to [place]. Wife said that for the past week and a half he was doing strange things - out of character. Sat AM he was having trouble feeding himself, crying, slow HR, couldn't stand up, etc. Has been bipolar and been on medication for 30 years - has been with wife for 20 years. First incident was a breakdown and hospitalization, then declared disabled because of bipolar d/o. Has been with same psychiatrist ever since - seeing monthly. Pt has been handling appointments totally on his own for some time. Then in jan 2021 had a seizure following strange spells. Wife felt he recovered to about 85% of baseline, memory worse, more dizzy spells, etc. At some point in March started talking in tongues, seemed like he was having a seizure, pt briefly became more alert/oriented then started acting weird, looking like he was going to fall - wife brought to ED - then treated as seizure. Has not recovered cognitively - has spent entire days looking for different things, friends are commenting that he seems different, unable to boat. Wife brings up dioxane exposure. Wife has no concern for suicide attempt. Has been calling 911 on imaginary people he sees on his property. Wife worried that he has not gotten lithium in the past few days - educated that level still therapeutic and appropriate to hold this medication.   Pt labile, crying, etc through assessment.   Pt oriented to self. Unable to state month or year. He does not know where he is. Did say that July 4 was his daughter's birthday (was his granddaughter's birthday). Returns to this topic throughout formal mental status exam. Unable to name current president.   Has reported to wife before he doesn't take seroquel.   Began throwing up about 10 days ago.   Labs:  CMP with no evidence of liver inury.   Meds from [place] (from fill  history): Quetiapien 400 mg QHS Ropinirole 1.5 mg qD Topiramate 200 mg QHS Lamotrigine 200 mg qD (dec from 400) Depakote 1000 mg/d Lithium 900 mg/d   Unclear why a pt with a seizure history was on lithium?

## 2021-11-01 NOTE — Consult Note (Signed)
[facility] Psychiat[facility] New Face-to-Face Psychiatric Evaluation   Service Date: November 01, 2021 LOS:  LOS: 1 day    Assessment  Matthew [name] is a 66 y.o. male admitted medically for 10/30/2021  4:35 PM for altered mental status. He carries the psychiatric diagnoses of bipolar disorder and has a past medical history of migraines and seizures.Psychiatry was consulted for toxic encephalopathy by Dr. Thedore Mins.    His current presentation of disorganized thought, thought blocking, visual hallucinations, and elevated lithium levels is most consistent with toxic encephalopathy 2/2 lithium toxicity.  We are currently holding lithium monitoring levels daily.  It is important to note that the blood levels of lithium will be different from the lithium levels in the CSF.  With this in mind, even if we do see of a decrease in lithium toxicity in the blood, behavioral changes may be delayed by a few days because of lithium still in the CSF. He is currently seeing a psychiatrist in [place] who prescribed Lamictal and Depakote.  It is unclear how he has been taking the medications.  At this time we will hold psychiatric medications except for Lamictal for seizure prophylaxis and bipolar disorder.  Wife reports that he was unintentionally taking Seroquel as well.  On initial examination, patient was not responding to command or question.  When speaking with his wife, he would be speaking.  Please see plan below for detailed recommendations.   Diagnoses:  Active Hospital problems: Principal Problem:   AMS (altered mental status) Active Problems:   Bipolar I disorder (HCC)   Essential hypertension   AKI (acute kidney injury) (HCC)   Enlarged prostate   Hypokalemia     Plan  ## Safety and Observation Level:  - Based on my clinical evaluation, I estimate the patient to be at low risk of self harm in the current setting - At this time, we recommend a routine level of observation. This decision is based on  my review of the chart including patient's history and current presentation, interview of the patient, mental status examination, and consideration of suicide risk including evaluating suicidal ideation, plan, intent, suicidal or self-harm behaviors, risk factors, and protective factors. This judgment is based on our ability to directly address suicide risk, implement suicide prevention strategies and develop a safety plan while the patient is in the clinical setting. Please contact our team if there is a concern that risk level has changed.   ## Medications:  --Continue with Lamictal 150 mg BID for seizure history and bipolar disorder --Hold Depakote, lithium, Seroquel  ## Medical Decision Making Capacity:  Not assessed  ## Further Work-up:  --Continue assessing lithium levels  -- most recent EKG on July 4 had QtC of 421 -- Pertinent labwork reviewed earlier this admission includes: Lithium 1.75 -> 1.32->1.43  ## Disposition:  --Home with assistance to medication  ## Behavioral / Environmental:  --Outpatient psychiatry  ##Legal Status   Thank you for this consult request. Recommendations have been communicated to the primary team.  We will continue to follow at this time.   [name], MD   NEW history  Relevant Aspects of Hospital Course:  Admitted on 10/30/2021 for toxic encephalopathy.  Patient Report:  Patient was seen this morning.  Information was obtained from wife who was in the room.  Wife notes that since March of this year, he has been "85%" ever since he had a seizure.  She notes his behavior being from baseline, at times not paying attention  to conversation.  A week and a half ago, she notes the patient stumbling and vomiting for 3 days.  The vomiting subsided but then over the weekend he was reporting hallucinations, seeing people in their yard and believes they are trying to steal from him.  They called 911 twice due to his hallucinations.  The wife does not feel  he is expressing suicidal ideation.  Patient has a long history of bipolar disorder diagnosed in his 34s.  He has been seeing a psychiatrist in [place] who manages his bipolar medication.  They are not able to see him in person except for 3 times a year.  Other than those times they do have telepsych visits.  She notes him taking Lithium, Depakote, and Seroquel in the past.  The patient also has another episode of a seizure that occurred January 2021 where months after the seizure, the wife reports the patient having "panic attacks".  He also has a history of migraines that she felt went hand-in-hand with his bipolar disorder.  She reports the migraines have stopped ever since he had seizures.    Collateral information:  Patient report is from the wife as the patient was unable to respond to questions.  Psychiatric History:  Information collected from wife.  The patient has a history of bipolar disorder.  Was hospitalized mid 66s.  Family psych history: Niece has reported bipolar disorder.   Social History:   Tobacco use: Not assessed Alcohol use: Wife denies Drug use: Occasional use of marijuana  Family History:   The patient's family history includes Arthritis in an other family member; Cancer (age of onset: 79) in his father; Depression in his mother; Heart disease in his paternal grandmother; Hypertension in his father and another family member.  Medical History: Past Medical History:  Diagnosis Date   Anxiety    on meds   AVM (arteriovenous malformation) of colon 02/04/2021   Bipolar I disorder, most recent episode (or current) mixed, moderate    GERD (gastroesophageal reflux disease)    Hypertension    on meds   Migraines    OSA (obstructive sleep apnea)    retested-bipap not needed   Panic attacks    Seizures ([place])    last known 05/2020;   Substance abuse ([place])     Surgical History: Past Surgical History:  Procedure Laterality Date   CARPOMETACARPEL ([place])  FUSION OF THUMB WITH AUTOGRAFT FROM RADIUS Bilateral    COLONOSCOPY  2011   CG-in pt at [place] Left 06/17/2012   Procedure: FINGER ARTHROPLASTY;  Surgeon: Jolyn Nap, MD;  Location: [facility];  Service: Orthopedics;  Laterality: Left;  left thumb suspension arthroplasty    HEMORRHOID SURGERY  2000   Dr Rosana Hoes   TONSILLECTOMY     WISDOM TOOTH EXTRACTION      Medications:   Current Facility-Administered Medications:    dextrose 5 % with KCl 20 mEq / L  infusion, 20 mEq, Intravenous, Continuous, Thurnell Lose, MD, Last Rate: 100 mL/hr at 11/01/21 1449, Infusion Verify at 11/01/21 1449   enoxaparin (LOVENOX) injection 40 mg, 40 mg, Subcutaneous, Daily, Tu, Ching T, DO, 40 mg at 11/01/21 1109   famotidine (PEPCID) tablet 10 mg, 10 mg, Oral, Daily, Lala Lund K, MD, 10 mg at 11/01/21 1107   lamoTRIgine (LAMICTAL) tablet 150 mg, 150 mg, Oral, BID, Lala Lund K, MD, 150 mg at 11/01/21 1106   ondansetron (ZOFRAN) injection 4 mg, 4 mg, Intravenous, Q6H  PRN, Leroy Sea, MD, 4 mg at 10/31/21 1747  Allergies: Allergies  Allergen Reactions   Sumatriptan Other (See Comments)     dizziness   Tramadol     Severe dizziness, restless leg, jittery       Objective  Vital signs:  Temp:  [98.3 F (36.8 C)-98.7 F (37.1 C)] 98.7 F (37.1 C) (07/03 2343) Pulse Rate:  [50-57] 50 (07/04 0801) Resp:  [13-21] 14 (07/04 0801) BP: (125-158)/(61-79) 157/68 (07/04 0801) SpO2:  [98 %-100 %] 100 % (07/04 0400)  Psychiatric Specialty Exam:  Presentation  General Appearance: Appropriate for Environment (has soft restrains) Eye Contact:Poor Speech:Garbled Speech Volume:Normal Handedness:No data recorded  Mood and Affect  Mood:Dysphoric; Labile Affect:Tearful  Thought Process  Thought Processes:Disorganized; Irrevelant Descriptions of Associations:No data recorded Orientation:None  Thought Content:Illogical  History of  Schizophrenia/Schizoaffective disorder:No data recorded Duration of Psychotic Symptoms:No data recorded Hallucinations:Hallucinations: None  Ideas of Reference:None  Suicidal Thoughts:Suicidal Thoughts: No  Homicidal Thoughts:Homicidal Thoughts: No   Sensorium  Memory:Immediate Poor; Recent Poor; Remote Poor Judgment:Poor Insight:Poor  Executive Functions  Concentration:Poor Attention Span:Poor Recall:Poor Fund of Knowledge:Poor Language:Poor  Psychomotor Activity  Psychomotor Activity:Psychomotor Activity: Restlessness  Assets  Assets:No data recorded  Sleep  Sleep:No data recorded   Physical Exam: Physical Exam Constitutional:      General: He is in acute distress.  Pulmonary:     Effort: Pulmonary effort is normal.  Neurological:     Mental Status: He is disoriented.     Comments: No abnormal clonus detected Hyperalgesia was present    Review of Systems  Constitutional:  Positive for weight loss. Negative for chills and fever.  Neurological:  Negative for tremors.  Psychiatric/Behavioral:  Positive for hallucinations and memory loss.    Blood pressure (!) 157/68, pulse (!) 50, temperature 98.7 F (37.1 C), temperature source Axillary, resp. rate 14, height 5\' 5"  (1.651 m), weight 60 kg, SpO2 100 %. Body mass index is 22.01 kg/m.

## 2021-11-01 NOTE — Progress Notes (Addendum)
PROGRESS NOTE                                                                                                                                                                                                             Patient Demographics:    Matthew [name], is a 66 y.o. male, DOB - 1956-02-12, MWN:[id number]  Outpatient Primary MD for the patient is Soundra Pilon, FNP    LOS - 1  Admit date - 10/30/2021    Chief Complaint  Patient presents with   Abdominal Pain       Brief Narrative (HPI from H&P)   66 y.o. male with medical history significant of seizure, Bipolar disorder, HTN, migraines, substance abuse who presents with altered mental status.  He was noted to be acting confused by his wife, recently his psychiatrist in [place] had made multiple changes to his home medications, of note few months ago he was admitted to [facility] for possible seizure-like activity and at that time his Lamictal dose was increased.  Currently he is on multiple neurocognitive and psych medications with evidence of lithium toxicity in the ER.  He has been admitted for toxic encephalopathy and to be evaluated by psychiatry and neurology.   Subjective:    [name] today has, No headache, No chest pain, No abdominal pain - No Nausea, No new weakness tingling or numbness, no shortness of breath.   Assessment  & Plan :    AMS (altered mental status) in a patient with bipolar disorder with Lithium OD causing toxic encephalopathy - Most likely from polypharmacy with underlying bipolar disorder/seizure. Pt has multiple neurocognitive medications on his list .  He had moderate to severe lithium toxicity, CT head unremarkable no focal deficits, supportive care for lithium toxicity with IV fluids, lithium levels are coming down mentation is gradually improving.  Psych on board.  Discussed with neurology in detail nothing else to offer.  DW Renal & CCS continue to monitor levels.    Latest Reference Range & Units 10/31/21 00:47 10/31/21 09:20 10/31/21 16:48 11/01/21 05:28 11/01/21 07:33 11/01/21 16:40  Lithium 0.60 - 1.20 mmol/L 2.50 (HH) 2.01 (HH) 1.75 (HH) 1.32 (H) 1.43 (H) 1.06     History of seizures.  Multiple neurocognitive and psych medications, recent admission for possible seizure-like activity done by neurology.  DW neurology -  for now continue Lamictal hold Depakote and Topamax for now. Wife also reported severe allergy to Depakote.  Hypokalemia - replaced.  Enlarged prostate - Incidental finding on CT abdomen/pelvis.  PSA obtained by ED PA within normal limits.  Wife is not aware of any urinary symptoms.  Outpatient urology follow-up to be arranged by PCP.  AKI (acute kidney injury) (HCC)  - due to lithium toxicity resolved after IV fluids.  Stable renal ultrasound.  Avoid nephrotoxins.  Essential hypertension - BP stable off Meds, monitor.  Sinus bradycardia with intermittent runs of junctional rhythm.  Case discussed with cardiologist Dr. Rosemary HolmsPatwardhan, avoid nodal agents, lithium levels near normal now, was on Coreg to yesterday which was discontinued and he was given IV glucagon do not think that is playing a role, stable TSH.  Cardiology will see continue to monitor on telemetry.  Stable blood pressure and he is symptom-free from this standpoint.      Condition - Extremely Guarded  Family Communication  :   Called wife [name] [phone] updated 10/31/2021, called on 11/01/2021 at 10:33 AM.  Full mailbox.  Code Status :  Full  Consults  :  Psych, neuro  PUD Prophylaxis : pepcid   Procedures  :     MRI Brain - Non acute  CT Abd Pelvis with IV contrast done in the ER - non acute, large prostate.  Renal ultrasound.      Disposition Plan  :    Status is: Observation  DVT Prophylaxis  :    enoxaparin (LOVENOX) injection 40 mg Start: 10/31/21 1000    Lab Results  Component Value Date   PLT  193 11/01/2021    Diet :  Diet Order             DIET DYS 3 Room service appropriate? Yes; Fluid consistency: Nectar Thick  Diet effective now                    Inpatient Medications  Scheduled Meds:  enoxaparin (LOVENOX) injection  40 mg Subcutaneous Daily   famotidine  10 mg Oral Daily   lamoTRIgine  150 mg Oral BID   potassium chloride  40 mEq Oral Once   Continuous Infusions:  dextrose 5 % with KCl 20 mEq / L 75 mL/hr at 11/01/21 0735   PRN Meds:.  Antibiotics  :    Anti-infectives (From admission, onward)    None        Time Spent in minutes  30   Susa RaringPrashant Chloeanne Poteet M.D on 11/01/2021 at 10:30 AM  To page go to [email or web address]   [facility] -  Office  [phone]  See all Orders from today for further details    Objective:   Vitals:   10/31/21 2343 11/01/21 0345 11/01/21 0400 11/01/21 0801  BP: 127/61 (!) 158/79  (!) 157/68  Pulse: (!) 55 (!) 54  (!) 50  Resp: 20 17  14   Temp: 98.7 F (37.1 C)     TempSrc: Axillary     SpO2:   100%   Weight:      Height:        Wt Readings from Last 3 Encounters:  10/30/21 60 kg  07/14/21 67.1 kg  07/05/21 71.9 kg     Intake/Output Summary (Last 24 hours) at 11/01/2021 1030 Last data filed at 11/01/2021 0701 Gross per 24 hour  Intake 1669.93 ml  Output 600 ml  Net 1069.93 ml     Physical Exam  Awake Alert  x1, No new F.N deficits, Normal affect [place].AT,PERRAL Supple Neck, No JVD,   Symmetrical Chest wall movement, Good air movement bilaterally, CTAB RRR,No Gallops, Rubs or new Murmurs,  +ve B.Sounds, Abd Soft, No tenderness,   No Cyanosis, Clubbing or edema      Data Review:    CBC Recent Labs  Lab 10/31/21 0338 11/01/21 0528  WBC 7.1 9.4  HGB 10.9* 10.9*  HCT 32.8* 31.8*  PLT 190 193  MCV 91.1 89.6  MCH 30.3 30.7  MCHC 33.2 34.3  RDW 11.7 11.7  LYMPHSABS  --  1.5  MONOABS  --  0.9  EOSABS  --  0.3  BASOSABS  --  0.0    Electrolytes Recent Labs  Lab 10/30/21 1720  10/31/21 0047 10/31/21 0338 10/31/21 0920 10/31/21 1648 11/01/21 0528  NA 138  --  141 144 143 140  K 3.1*  --  3.2* 3.5 3.6 3.3*  CL 112*  --  113* 116* 119* 113*  CO2 21*  --  20* 18* 20* 20*  GLUCOSE 86  --  79 76 94 121*  BUN 42*  --  35* 30* 24* 19  CREATININE 1.78*  --  1.61* 1.40* 1.28* 1.17  CALCIUM 9.6  --  9.3 9.7 9.4 9.3  AST 15  --   --   --   --  15  ALT 13  --   --   --   --  13  ALKPHOS 49  --   --   --   --  48  BILITOT 0.8  --   --   --   --  1.0  ALBUMIN 3.7  --   --   --   --  3.2*  MG  --  2.3  --   --   --  2.0  INR 1.1  --   --   --   --   --   TSH  --  1.207  --   --  1.088  --   BNP  --   --   --   --   --  979.3*    ------------------------------------------------------------------------------------------------------------------ No results for input(s): "CHOL", "HDL", "LDLCALC", "TRIG", "CHOLHDL", "LDLDIRECT" in the last 72 hours.  Lab Results  Component Value Date   HGBA1C 4.6 (L) 07/07/2021    Recent Labs    10/31/21 1648  TSH 1.088   ------------------------------------------------------------------------------------------------------------------ ID Labs Recent Labs  Lab 10/30/21 1720 10/31/21 0338 10/31/21 0920 10/31/21 1648 11/01/21 0528  WBC  --  7.1  --   --  9.4  PLT  --  190  --   --  193  CREATININE 1.78* 1.61* 1.40* 1.28* 1.17   Cardiac Enzymes No results for input(s): "CKMB", "TROPONINI", "MYOGLOBIN" in the last 168 hours.  Invalid input(s): "CK"   Radiology Reports DG Abd Portable 1V  Result Date: 11/01/2021 CLINICAL DATA:  Abdominal pain. EXAM: PORTABLE ABDOMEN - 1 VIEW COMPARISON:  10/30/2021 FINDINGS: The bowel gas pattern is normal. No radio-opaque calculi or other significant radiographic abnormality are seen. IMPRESSION: Negative. Electronically Signed   By: Signa Kell M.D.   On: 11/01/2021 07:36   US RENAL  Result Date: 10/31/2021 CLINICAL DATA:  66 year old male with acute renal injury. EXAM: RENAL /  URINARY TRACT ULTRASOUND COMPLETE COMPARISON:  CT Abdomen and Pelvis 10/30/2021. FINDINGS: Right Kidney: Renal measurements: 11.4 x 5.4 x 5.6 cm = volume: 114 mL. Echogenicity within normal limits. No solid mass or hydronephrosis visualized.  Small 9 mm benign appearing cortical cyst (image 7, no follow-up imaging recommended). Left Kidney: Renal measurements: 10.1 x 5.4 x 5.8 cm = volume: 163 mL. Echogenicity within normal limits. No mass or hydronephrosis visualized. Bladder: Appears normal for degree of bladder distention. Other: Prostatomegaly, estimated prostate volume 51 mL. IMPRESSION: Negative ultrasound appearance of both kidneys and the urinary bladder. Electronically Signed   By: Odessa Fleming M.D.   On: 10/31/2021 09:36   MR BRAIN WO CONTRAST  Result Date: 10/31/2021 CLINICAL DATA:  Initial evaluation for acute mental status change. EXAM: MRI HEAD WITHOUT CONTRAST TECHNIQUE: Multiplanar, multiecho pulse sequences of the brain and surrounding structures were obtained without intravenous contrast. COMPARISON:  Prior CT from 10/30/2021 as well as previous MRI from 07/06/2021. FINDINGS: Brain: Examination technically limited as the patient was unable to tolerate the full length of the study. Diffusion-weighted sequences, axial T2 and FLAIR sequences, and sagittal T1 weighted sequences only were performed. Additionally, images are intermittently moderately to severely degraded by motion artifact. Cerebral volume within normal limits. No visible significant cerebral white matter disease for age. No abnormal foci of restricted diffusion to suggest acute or subacute ischemia. Tiny remote right cerebellar infarct noted (series 10, image 2). No other visible areas of chronic cortical infarction. No visible mass lesion, mass effect or midline shift. Ventricles normal size without hydrocephalus. No extra-axial fluid collection. Vascular: Major intracranial vascular flow voids are grossly maintained at the skull base.  Skull and upper cervical spine: Craniocervical junction not well assessed on this limited exam. Bone marrow signal intensity grossly within normal limits. No visible soft tissue abnormality. Sinuses/Orbits: Globes orbital soft tissues grossly within normal limits. Scattered mucosal thickening noted about the ethmoidal air cells. Superimposed right posterior ethmoidal sinus mucocele versus retention cyst. No significant mastoid effusion. Other: None. IMPRESSION: 1. Technically limited exam due to the patient's inability to tolerate the full length of the study and extensive motion artifact. 2. Grossly negative brain MRI. No acute intracranial infarct. No other definite acute intracranial abnormality. 3. Tiny remote right cerebellar infarct. Electronically Signed   By: Rise Mu M.D.   On: 10/31/2021 02:14   CT ABDOMEN PELVIS W CONTRAST  Result Date: 10/30/2021 CLINICAL DATA:  Abdominal pain. EXAM: CT ABDOMEN AND PELVIS WITH CONTRAST TECHNIQUE: Multidetector CT imaging of the abdomen and pelvis was performed using the standard protocol following bolus administration of intravenous contrast. RADIATION DOSE REDUCTION: This exam was performed according to the departmental dose-optimization program which includes automated exposure control, adjustment of the mA and/or kV according to patient size and/or use of iterative reconstruction technique. CONTRAST:  67mL OMNIPAQUE IOHEXOL 300 MG/ML  SOLN COMPARISON:  None Available. FINDINGS: Lower chest: Mild atelectasis is seen within the posterior aspect of the bilateral lung bases. Hepatobiliary: No focal liver abnormality is seen. No gallstones, gallbladder wall thickening, or biliary dilatation. Pancreas: Unremarkable. No pancreatic ductal dilatation or surrounding inflammatory changes. Spleen: Normal in size without focal abnormality. Adrenals/Urinary Tract: Adrenal glands are unremarkable. Kidneys are normal in size, without renal calculi or hydronephrosis. A  6 mm cystic appearing area is seen within the medial aspect of the mid to lower right kidney. Bladder is unremarkable. Stomach/Bowel: Stomach is within normal limits. Appendix appears normal (best seen on coronal reformatted images 60 through 88, CT series 7). No evidence of bowel wall thickening, distention, or inflammatory changes. Noninflamed diverticula are seen throughout the descending and sigmoid colon. Vascular/Lymphatic: Aortic atherosclerosis. No enlarged abdominal or pelvic lymph nodes. Reproductive: There is moderate  to marked severity prostate gland enlargement. Other: No abdominal wall hernia or abnormality. No abdominopelvic ascites. Musculoskeletal: Degenerative changes are seen throughout the lumbar spine. IMPRESSION: 1. Colonic diverticulosis. 2. Moderate to marked severity prostate gland enlargement. Correlation with PSA values is recommended. Aortic Atherosclerosis (ICD10-I70.0). Electronically Signed   By: Aram Candela M.D.   On: 10/30/2021 19:56   CT Head Wo Contrast  Result Date: 10/30/2021 CLINICAL DATA:  Altered mental status, unknown cause. EXAM: CT HEAD WITHOUT CONTRAST TECHNIQUE: Contiguous axial images were obtained from the base of the skull through the vertex without intravenous contrast. RADIATION DOSE REDUCTION: This exam was performed according to the departmental dose-optimization program which includes automated exposure control, adjustment of the mA and/or kV according to patient size and/or use of iterative reconstruction technique. COMPARISON:  Head CT 07/07/2021, brain MRI 07/06/2021 FINDINGS: Brain: No intracranial hemorrhage, mass effect, or midline shift. No hydrocephalus. The basilar cisterns are patent. No evidence of territorial infarct or acute ischemia. No extra-axial or intracranial fluid collection. Vascular: Atherosclerosis of skullbase vasculature without hyperdense vessel or abnormal calcification. Skull: No fracture or focal lesion. Sinuses/Orbits:  Minimal opacification of a posterior right ethmoid air cell. No mastoid effusion. Unremarkable appearance to the orbits. Other: None. IMPRESSION: Unremarkable noncontrast head CT. No acute intracranial abnormality. Electronically Signed   By: Narda Rutherford M.D.   On: 10/30/2021 19:55

## 2021-11-02 DIAGNOSIS — R4182 Altered mental status, unspecified: Secondary | ICD-10-CM | POA: Diagnosis not present

## 2021-11-02 DIAGNOSIS — N179 Acute kidney failure, unspecified: Secondary | ICD-10-CM | POA: Diagnosis not present

## 2021-11-02 LAB — COMPREHENSIVE METABOLIC PANEL
ALT: 14 U/L (ref 0–44)
AST: 15 U/L (ref 15–41)
Albumin: 3.4 g/dL — ABNORMAL LOW (ref 3.5–5.0)
Alkaline Phosphatase: 56 U/L (ref 38–126)
Anion gap: 8 (ref 5–15)
BUN: 9 mg/dL (ref 8–23)
CO2: 20 mmol/L — ABNORMAL LOW (ref 22–32)
Calcium: 9.6 mg/dL (ref 8.9–10.3)
Chloride: 111 mmol/L (ref 98–111)
Creatinine, Ser: 1.07 mg/dL (ref 0.61–1.24)
GFR, Estimated: >60 mL/min (ref >60)
Glucose, Bld: 140 mg/dL — ABNORMAL HIGH (ref 70–99)
Potassium: 3.6 mmol/L (ref 3.5–5.1)
Sodium: 139 mmol/L (ref 135–145)
Total Bilirubin: 0.8 mg/dL (ref 0.3–1.2)
Total Protein: 5.4 g/dL — ABNORMAL LOW (ref 6.5–8.1)

## 2021-11-02 LAB — CBC WITH DIFFERENTIAL/PLATELET
Abs Immature Granulocytes: 0.04 10*3/uL (ref 0.00–0.07)
Basophils Absolute: 0 10*3/uL (ref 0.0–0.1)
Basophils Relative: 0 %
Eosinophils Absolute: 0.5 10*3/uL (ref 0.0–0.5)
Eosinophils Relative: 5 %
HCT: 36.4 % — ABNORMAL LOW (ref 39.0–52.0)
Hemoglobin: 12.9 g/dL — ABNORMAL LOW (ref 13.0–17.0)
Immature Granulocytes: 0 %
Lymphocytes Relative: 17 %
Lymphs Abs: 1.7 10*3/uL (ref 0.7–4.0)
MCH: 30.6 pg (ref 26.0–34.0)
MCHC: 35.4 g/dL (ref 30.0–36.0)
MCV: 86.3 fL (ref 80.0–100.0)
Monocytes Absolute: 1.1 10*3/uL — ABNORMAL HIGH (ref 0.1–1.0)
Monocytes Relative: 11 %
Neutro Abs: 6.5 10*3/uL (ref 1.7–7.7)
Neutrophils Relative %: 67 %
Platelets: 209 10*3/uL (ref 150–400)
RBC: 4.22 MIL/uL (ref 4.22–5.81)
RDW: 11.6 % (ref 11.5–15.5)
WBC: 9.8 10*3/uL (ref 4.0–10.5)
nRBC: 0 % (ref 0.0–0.2)

## 2021-11-02 LAB — LITHIUM LEVEL
Lithium Lvl: 0.76 mmol/L (ref 0.60–1.20)
Lithium Lvl: 0.53 mmol/L — ABNORMAL LOW (ref 0.60–1.20)

## 2021-11-02 LAB — MAGNESIUM: Magnesium: 1.8 mg/dL (ref 1.7–2.4)

## 2021-11-02 LAB — UREA NITROGEN, URINE: Urea Nitrogen, Ur: 532 mg/dL

## 2021-11-02 LAB — LAMOTRIGINE LEVEL: Lamotrigine Lvl: 5.5 ug/mL (ref 2.0–20.0)

## 2021-11-02 MED ORDER — POTASSIUM CHLORIDE 20 MEQ PO PACK
40.0000 meq | PACK | Freq: Once | ORAL | Status: AC
  Administered 2021-11-02: 40 meq via ORAL
  Filled 2021-11-02: qty 2

## 2021-11-02 MED ORDER — AMLODIPINE BESYLATE 10 MG PO TABS
10.0000 mg | ORAL_TABLET | Freq: Every day | ORAL | Status: DC
  Administered 2021-11-02 – 2021-11-04 (×3): 10 mg via ORAL
  Filled 2021-11-02 (×3): qty 1

## 2021-11-02 MED ORDER — POTASSIUM CL IN DEXTROSE 5% 20 MEQ/L IV SOLN
20.0000 meq | INTRAVENOUS | Status: AC
  Filled 2021-11-02: qty 1000

## 2021-11-02 MED ORDER — MAGNESIUM SULFATE IN D5W 1-5 GM/100ML-% IV SOLN
1.0000 g | Freq: Once | INTRAVENOUS | Status: AC
  Administered 2021-11-02: 1 g via INTRAVENOUS
  Filled 2021-11-02: qty 100

## 2021-11-02 NOTE — Plan of Care (Signed)

## 2021-11-02 NOTE — Consult Note (Addendum)
[facility] Psychiatry Followup Face-to-Face Psychiatric Evaluation   Service Date: November 02, 2021 LOS:  LOS: 2 days    Assessment  Matthew [name] is a 66 y.o. male admitted medically for 10/30/2021  4:35 PM for altered mental status. He carries the psychiatric diagnoses of bipolar disorder and has a past medical history of migraines and seizures.Psychiatry was consulted for toxic encephalopathy by Dr. Thedore Mins.    His initial presentation of disorganized thought, thought blocking, visual hallucinations, and elevated lithium levels is most consistent with toxic encephalopathy 2/2 lithium toxicity.  He is currently seeing a psychiatrist in [place] who prescribed Lamictal and Depakote.  It is unclear how he has been taking the medications.  At this time we will hold psychiatric medications except for Lamictal for seizure prophylaxis and bipolar disorder.    On today's evaluation, patient is doing better compared to prior assessments.  He was able to make eye contact, respond to questions, and had some moderate thought blocking.  As levels of lithium continued to decrease we should be able to see his mentation and behavior be closer to baseline.  We are currently holding lithium, monitoring levels daily. We recommend continuing Lamictal at current dose.  Please see below for detailed recommendations.  Diagnoses:  Active Hospital problems: Principal Problem:   AMS (altered mental status) Active Problems:   Bipolar I disorder (HCC)   Essential hypertension   AKI (acute kidney injury) (HCC)   Enlarged prostate   Hypokalemia     Plan  ## Safety and Observation Level:  - Based on my clinical evaluation, I estimate the patient to be at low risk of self harm in the current setting - At this time, we recommend a routine level of observation. This decision is based on my review of the chart including patient's history and current presentation, interview of the patient, mental status  examination, and consideration of suicide risk including evaluating suicidal ideation, plan, intent, suicidal or self-harm behaviors, risk factors, and protective factors. This judgment is based on our ability to directly address suicide risk, implement suicide prevention strategies and develop a safety plan while the patient is in the clinical setting. Please contact our team if there is a concern that risk level has changed.   ## Medications:  --Continue with Lamictal 150 mg twice a day for seizure history and bipolar disorder -Hold Depakote, lithium, Seroquel  ## Medical Decision Making Capacity:  Not assessed  ## Further Work-up:  -- most recent EKG on July 4 had QtC of 421 -- Pertinent labwork reviewed earlier this admission includes: Lithium 1.75 -> 1.32->1.43->0.76  ## Disposition:  --Home with assistance to medication  ## Behavioral / Environmental:  --Referred to outpatient psychiatry    Thank you for this consult request. Recommendations have been communicated to the primary team.  We will continue to follow at this time.   [name], MD   Followup history  Relevant Aspects of Hospital Course:  Admitted on 10/30/2021 for toxic encephalopathy.  Patient Report:  Patient was seen this morning.  Patient was able to respond and have conversation today.  He notes his mood as "good".  No concerns with sleep or appetite.  Denies nausea, vomiting, diarrhea.  Patient reports staring at the ceiling makes him mildly dizzy. Patient was alert and oriented x2, was able to state his name and where he is at.  He can recall why he is in the hospital.  Patient asked to be discharged today and when he  was told that he will not be discharged today, he became tearful.  He does not recall his wife visiting him yesterday.  Denies SI, HI, AVH.   Collateral information:  Not obtained today  Psychiatric History:  Information collected from wife.  The patient has a history of bipolar disorder.   Was hospitalized mid 33s.   Family psych history: Niece has reported bipolar disorder.     Social History:    Tobacco use: Not assessed Alcohol use: Wife denies Drug use: Occasional use of marijuana   Family History:    The patient's family history includes Arthritis in an other family member; Cancer (age of onset: 50) in his father; Depression in his mother; Heart disease in his paternal grandmother; Hypertension in his father and another family member.  Medical History: Past Medical History:  Diagnosis Date   Anxiety    on meds   AVM (arteriovenous malformation) of colon 02/04/2021   Bipolar I disorder, most recent episode (or current) mixed, moderate    GERD (gastroesophageal reflux disease)    Hypertension    on meds   Migraines    OSA (obstructive sleep apnea)    retested-bipap not needed   Panic attacks    Seizures (HCC)    last known 05/2020;   Substance abuse (HCC)     Surgical History: Past Surgical History:  Procedure Laterality Date   CARPOMETACARPEL ([facility]) FUSION OF THUMB WITH AUTOGRAFT FROM RADIUS Bilateral    COLONOSCOPY  2011   CG[facility]in pt at [facility]-   FINGER ARTHROPLASTY Left 06/17/2012   Procedure: FINGER ARTHROPLASTY;  Surgeon: Jodi Marble, MD;  Location: [facility];  Service: Orthopedics;  Laterality: Left;  left thumb suspension arthroplasty    HEMORRHOID SURGERY  2000   Dr Earlene Plater   TONSILLECTOMY     WISDOM TOOTH EXTRACTION      Medications:   Current Facility-Administered Medications:    amLODipine (NORVASC) tablet 10 mg, 10 mg, Oral, Daily, Susa Raring K, MD, 10 mg at 11/02/21 0927   dextrose 5 % with KCl 20 mEq / L  infusion, 20 mEq, Intravenous, Continuous, Leroy Sea, MD, Last Rate: 35 mL/hr at 11/02/21 0644, Infusion Verify at 11/02/21 0644   enoxaparin (LOVENOX) injection 40 mg, 40 mg, Subcutaneous, Daily, Tu, Ching T, DO, 40 mg at 11/02/21 0827   famotidine (PEPCID) tablet 10 mg, 10 mg, Oral, Daily, Susa Raring K, MD, 10 mg at 11/02/21 0827   lamoTRIgine (LAMICTAL) tablet 150 mg, 150 mg, Oral, BID, Susa Raring K, MD, 150 mg at 11/02/21 0827   ondansetron (ZOFRAN) injection 4 mg, 4 mg, Intravenous, Q6H PRN, Leroy Sea, MD, 4 mg at 10/31/21 1747  Allergies: Allergies  Allergen Reactions   Sumatriptan Other (See Comments)     dizziness   Tramadol     Severe dizziness, restless leg, jittery       Objective  Vital signs:  Temp:  [98.5 F (36.9 C)-98.7 F (37.1 C)] 98.7 F (37.1 C) (07/05 0700) Pulse Rate:  [55-61] 61 (07/05 0700) Resp:  [16-20] 20 (07/05 0700) BP: (159-182)/(65-103) 160/65 (07/05 0700) SpO2:  [97 %] 97 % (07/05 0700)  Psychiatric Specialty Exam:  Presentation  General Appearance: Appropriate for Environment  Eye Contact:Fair  Speech:Blocked  Speech Volume:Decreased  Handedness:No data recorded  Mood and Affect  Mood:Euphoric  Affect:Tearful (Expansive)   Thought Process  Thought Processes:Disorganized  Descriptions of Associations:Intact  Orientation:Full (Time, Place and Person)  Thought Content:Logical; Perseveration (Requesting to  leave and is tearful when he does not remember current events like his wife visiting yesterday.)  History of Schizophrenia/Schizoaffective disorder:No data recorded Duration of Psychotic Symptoms:No data recorded Hallucinations:Hallucinations: None  Ideas of Reference:None  Suicidal Thoughts:Suicidal Thoughts: No  Homicidal Thoughts:Homicidal Thoughts: No   Sensorium  Memory:Immediate Good; Recent Fair; Remote Good  Judgment:Fair  Insight:Fair   Executive Functions  Concentration:Poor  Attention Span:Poor  Recall:Fair  Fund of Knowledge:Fair  Language:Fair   Psychomotor Activity  Psychomotor Activity:Psychomotor Activity: Normal   Assets  Assets:No data recorded  Sleep  Sleep:Sleep: Good    Physical Exam: Physical Exam Constitutional:      Appearance: He is normal  weight.  Pulmonary:     Effort: Pulmonary effort is normal.  Skin:    General: Skin is warm and dry.  Neurological:     General: No focal deficit present.     Mental Status: He is alert.    Review of Systems  Constitutional:  Negative for chills, fever and weight loss.  Neurological:  Negative for tremors.  Psychiatric/Behavioral:  Positive for memory loss. Negative for depression and suicidal ideas.    Blood pressure (!) 160/65, pulse 61, temperature 98.7 F (37.1 C), temperature source Oral, resp. rate 20, height 5\' 5"  (1.651 m), weight 60 kg, SpO2 97 %. Body mass index is 22.01 kg/m.

## 2021-11-02 NOTE — Progress Notes (Signed)
Heart rate appropriately increasing. Echocardiogram pending. No other cardiac workup needed at this time.  Truett Mainland, MD

## 2021-11-02 NOTE — Progress Notes (Signed)
Speech Language Pathology Treatment: Dysphagia  Patient Details Name: Matthew [name] MRN: [id number] DOB: Jun 19, 1955 Today's Date: 11/02/2021 Time: 7048-8891 SLP Time Calculation (min) (ACUTE ONLY): 25 min  Assessment / Plan / Recommendation Clinical Impression  Pt has made a remarkable change since last session in ED. Pt now attentive, able to participate in an appropriate conversation, demonstrate joint attention. Pragmatics still a little impaired but pt improving. Would benefit from f/u with SLP for cognition at next level of care now that he is able to participate. Pt now tolerates thin liquids and regular solids. Will upgrade and sign off for dysphagia. Could benefit from AIR consult.   HPI HPI: 66 y.o. male with medical history significant of seizure, Bipolar disorder, HTN, migraines, substance abuse who presents with altered mental status.  He was noted to be acting confused by his wife, recently his psychiatrist in [place] had made multiple changes to his home medications, of note few months ago he was admitted to [facility] for possible seizure-like activity and at that time his Lamictal dose was increased.  Currently he is on multiple neurocognitive and psych medications with evidence of lithium toxicity in the ER.  He has been admitted for toxic encephalopathy and to be evaluated by psychiatry and neurology.      SLP Plan  All goals met      Recommendations for follow up therapy are one component of a multi-disciplinary discharge planning process, led by the attending physician.  Recommendations may be updated based on patient status, additional functional criteria and insurance authorization.    Recommendations  Diet recommendations: Regular;Thin liquid Liquids provided via: Cup;Straw Medication Administration: Whole meds with liquid Supervision: Patient able to self feed Compensations: Slow rate;Small sips/bites Postural Changes and/or Swallow Maneuvers: Seated  upright 90 degrees                Oral Care Recommendations: Oral care BID Follow Up Recommendations: Acute inpatient rehab (3hours/day) SLP Visit Diagnosis: Dysphagia, unspecified (R13.10) Plan: All goals met           Dale Ribeiro, Katherene Ponto  11/02/2021, 3:06 PM

## 2021-11-02 NOTE — Consult Note (Incomplete)
Pateitn seen. Thinks he is eating and sleeping OK. Able to state full name. Not oriented to day of the week. ***, ***. On 5 min recall ***. Able to name prior 2 presidents.

## 2021-11-02 NOTE — Progress Notes (Addendum)
Neurology Progress Note   S:// Patient is awake and alert sitting in bed. No family at bedside. He is able to state his name, age and that he is in the hospital and name the hospital. He is unable to tell me the current month, or year. He is able to follow simple commands and name objects. No new neurological events overnight    O:// Current vital signs: BP (!) 160/65 (BP Location: Right Arm)   Pulse 61   Temp 98.7 F (37.1 C) (Oral)   Resp 20   Ht 5\' 5"  (1.651 m)   Wt 60 kg   SpO2 97%   BMI 22.01 kg/m  Vital signs in last 24 hours: Temp:  [98.5 F (36.9 C)-98.7 F (37.1 C)] 98.7 F (37.1 C) (07/05 0700) Pulse Rate:  [55-61] 61 (07/05 0700) Resp:  [16-20] 20 (07/05 0700) BP: (159-182)/(65-103) 160/65 (07/05 0700) SpO2:  [97 %] 97 % (07/05 0700)  GENERAL: Awake, alert in NAD HEENT: - Normocephalic and atraumatic, dry mm LUNGS - Clear to auscultation bilaterally with no wheezes CV - S1S2 RRR, no m/r/g, equal pulses bilaterally. ABDOMEN - Soft, nontender, nondistended with normoactive BS Ext: warm, well perfused, intact peripheral pulses, no edema  NEURO:  Mental Status: AA&Ox self, age and place. Unable to state city, current year, month. Able to follow commands and name objects.  Language: speech is clear, delayed and hypophonic Cranial Nerves: PERRL  3 mm/brisk. EOMI, visual fields full, no facial asymmetry, facial sensation intact, hearing intact, tongue/uvula/soft palate midline, normal sternocleidomastoid and trapezius muscle strength. No evidence of tongue atrophy or fibrillations Motor: right upper 5/5, left upper 4/5- (c/o hurting), bilateral lowers 5/5 Tone: is normal and bulk is normal Sensation- Intact to light touch bilaterally Coordination: FTN intact bilaterally, no ataxia in BLE. Gait- deferred   Medications  Current Facility-Administered Medications:    amLODipine (NORVASC) tablet 10 mg, 10 mg, Oral, Daily, 11-14-1969 K, MD, 10 mg at 11/02/21  0927   dextrose 5 % with KCl 20 mEq / L  infusion, 20 mEq, Intravenous, Continuous, 01/03/22, MD, Last Rate: 35 mL/hr at 11/02/21 0644, Infusion Verify at 11/02/21 0644   enoxaparin (LOVENOX) injection 40 mg, 40 mg, Subcutaneous, Daily, Tu, Ching T, DO, 40 mg at 11/02/21 0827   famotidine (PEPCID) tablet 10 mg, 10 mg, Oral, Daily, 01/03/22 K, MD, 10 mg at 11/02/21 0827   lamoTRIgine (LAMICTAL) tablet 150 mg, 150 mg, Oral, BID, 01/03/22 K, MD, 150 mg at 11/02/21 0827   ondansetron (ZOFRAN) injection 4 mg, 4 mg, Intravenous, Q6H PRN, 01/03/22, MD, 4 mg at 10/31/21 1747   Imaging I have reviewed images in epic and the results pertinent to this consultation are:  CT-scan of the brain 7/2 No acute process  MRI examination of the brain 7/3 1. Technically limited exam due to the patient's inability to tolerate the full length of the study and extensive motion artifact. 2. Grossly negative brain MRI. No acute intracranial infarct. No other definite acute intracranial abnormality. 3. Tiny remote right cerebellar infarct.  LABS: Lithium 2.50->2.01->1.75->1.32->1.43->1.06->0.76 VPA 42 Lamotrigine pending   Assessment:  66 y.o. male with medical history significant of seizure, Bipolar disorder, HTN, migraines, substance abuse who presents with altered mental status.  He was noted to be acting confused by his wife with 1- 2 weeks of progressive confusion falling and weakness  Toxic metabolic encephalopathy due to polypharmacy and Lithium OD Seizure disorder  Bipolar  Migraines  AKI Hypertension   Recommendations: - Continue Lamictal 150 twice daily for now, Increase Lamictal dosage to 200 mg q 12 hours starting 7/10, once valproic acid has been cleared. - Check VPA level on 7/7 -continue Neuro checks - rest per primary team.

## 2021-11-02 NOTE — Progress Notes (Signed)
PROGRESS NOTE                                                                                                                                                                                                             Patient Demographics:    Matthew [name], is a 66 y.o. male, DOB - 1956-01-29, VWU:[id number]  Outpatient Primary MD for the patient is Soundra Pilon, FNP    LOS - 2  Admit date - 10/30/2021    Chief Complaint  Patient presents with   Abdominal Pain       Brief Narrative (HPI from H&P)   66 y.o. male with medical history significant of seizure, Bipolar disorder, HTN, migraines, substance abuse who presents with altered mental status.  He was noted to be acting confused by his wife, recently his psychiatrist in [place] had made multiple changes to his home medications, of note few months ago he was admitted to [facility] for possible seizure-like activity and at that time his Lamictal dose was increased.  Currently he is on multiple neurocognitive and psych medications with evidence of lithium toxicity in the ER.  He has been admitted for toxic encephalopathy and to be evaluated by psychiatry and neurology.   Subjective:    Matthew [name] today has, No headache, No chest pain, No abdominal pain - No Nausea, No new weakness tingling or numbness, no shortness of breath.   Assessment  & Plan :    AMS (altered mental status) in a patient with bipolar disorder with Lithium OD causing toxic encephalopathy - Most likely from polypharmacy with underlying bipolar disorder/seizure. Pt has multiple neurocognitive medications on his list .  He had moderate to severe lithium toxicity, CT head unremarkable no focal deficits, supportive care for lithium toxicity with IV fluids, lithium levels are coming down mentation is gradually improving.  Psych on board.  Discussed with neurology in detail nothing else to offer.  DW Renal & CCS.  Lithium levels are at low normal therapeutic levels now.   Latest Reference Range & Units 10/31/21 00:47 10/31/21 09:20 10/31/21 16:48 11/01/21 05:28 11/01/21 07:33 11/01/21 16:40 11/02/21 04:59  Lithium 0.60 - 1.20 mmol/L 2.50 (HH) 2.01 (HH) 1.75 (HH) 1.32 (H) 1.43 (H) 1.06 0.76    History of seizures.  Multiple neurocognitive and psych medications, recent admission for possible seizure-like  activity done by neurology.  DW neurology - for now continue Lamictal hold Depakote and Topamax for now. Wife also reported severe allergy to Depakote.  Hypokalemia - replaced.  Enlarged prostate - Incidental finding on CT abdomen/pelvis.  PSA obtained by ED PA within normal limits.  Wife is not aware of any urinary symptoms.  Outpatient urology follow-up to be arranged by PCP.  AKI (acute kidney injury) (HCC)  - due to lithium toxicity resolved after IV fluids.  Stable renal ultrasound.     Essential hypertension - BP stable, start Norvasc and monitor.  Sinus bradycardia with intermittent runs of junctional rhythm.  Case discussed with cardiologist Dr. Rosemary Holms, avoid nodal agents, lithium levels near normal now, was on Coreg to yesterday which was discontinued and he was given IV glucagon do not think that is playing a role, stable TSH.  Cardiology will see continue to monitor on telemetry.  Stable blood pressure and he is symptom-free from this standpoint.  Heart rate near normal.      Condition - Extremely Guarded  Family Communication  :   Called wife Darl Pikes [phone] updated 10/31/2021, called on 11/01/2021 at 10:33 AM.  Full mailbox, she was updated later in the hospital on 11/01/2021, called 11/02/2021 at 8:46 AM, went to answering machine with full mailbox.  Code Status :  Full  Consults  :  Psych, neuro  PUD Prophylaxis : pepcid   Procedures  :     MRI Brain - Non acute  CT Abd Pelvis with IV contrast done in the ER - non acute, large prostate.  Renal ultrasound.       Disposition Plan  :    Status is: Observation  DVT Prophylaxis  :    enoxaparin (LOVENOX) injection 40 mg Start: 10/31/21 1000    Lab Results  Component Value Date   PLT 209 11/02/2021    Diet :  Diet Order             DIET DYS 3 Room service appropriate? Yes; Fluid consistency: Nectar Thick  Diet effective now                    Inpatient Medications  Scheduled Meds:  amLODipine  10 mg Oral Daily   enoxaparin (LOVENOX) injection  40 mg Subcutaneous Daily   famotidine  10 mg Oral Daily   lamoTRIgine  150 mg Oral BID   Continuous Infusions:  dextrose 5 % with KCl 20 mEq / L 35 mL/hr at 11/02/21 0644   PRN Meds:.  Antibiotics  :    Anti-infectives (From admission, onward)    None        Time Spent in minutes  30   Susa Raring M.D on 11/02/2021 at 8:45 AM  To page go to [email or web address]   [facility] -  Office  [phone]  See all Orders from today for further details    Objective:   Vitals:   11/01/21 0801 11/01/21 2026 11/02/21 0000 11/02/21 0525  BP: (!) 157/68 (!) 182/82 (!) 163/103 (!) 159/94  Pulse: (!) 50 (!) 55    Resp: 14 16 20    Temp:  98.5 F (36.9 C)    TempSrc:  Oral    SpO2:      Weight:      Height:        Wt Readings from Last 3 Encounters:  10/30/21 60 kg  07/14/21 67.1 kg  07/05/21 71.9 kg     Intake/Output Summary (Last 24  hours) at 11/02/2021 0845 Last data filed at 11/02/2021 0644 Gross per 24 hour  Intake 1997.75 ml  Output 2250 ml  Net -252.25 ml     Physical Exam  Awake Alert, No new F.N deficits, flat and depressed affect Matthew [name].AT,PERRAL Supple Neck, No JVD,   Symmetrical Chest wall movement, Good air movement bilaterally, CTAB RRR,No Gallops, Rubs or new Murmurs,  +ve B.Sounds, Abd Soft, No tenderness,   No Cyanosis, Clubbing or edema     Data Review:    CBC Recent Labs  Lab 10/31/21 0338 11/01/21 0528 11/02/21 0459  WBC 7.1 9.4 9.8  HGB 10.9* 10.9* 12.9*  HCT 32.8* 31.8* 36.4*   PLT 190 193 209  MCV 91.1 89.6 86.3  MCH 30.3 30.7 30.6  MCHC 33.2 34.3 35.4  RDW 11.7 11.7 11.6  LYMPHSABS  --  1.5 1.7  MONOABS  --  0.9 1.1*  EOSABS  --  0.3 0.5  BASOSABS  --  0.0 0.0    Electrolytes Recent Labs  Lab 10/30/21 1720 10/31/21 0047 10/31/21 0338 10/31/21 0920 10/31/21 1648 11/01/21 0528 11/02/21 0459  NA 138  --  141 144 143 140 139  K 3.1*  --  3.2* 3.5 3.6 3.3* 3.6  CL 112*  --  113* 116* 119* 113* 111  CO2 21*  --  20* 18* 20* 20* 20*  GLUCOSE 86  --  79 76 94 121* 140*  BUN 42*  --  35* 30* 24* 19 9  CREATININE 1.78*  --  1.61* 1.40* 1.28* 1.17 1.07  CALCIUM 9.6  --  9.3 9.7 9.4 9.3 9.6  AST 15  --   --   --   --  15 15  ALT 13  --   --   --   --  13 14  ALKPHOS 49  --   --   --   --  48 56  BILITOT 0.8  --   --   --   --  1.0 0.8  ALBUMIN 3.7  --   --   --   --  3.2* 3.4*  MG  --  2.3  --   --   --  2.0 1.8  INR 1.1  --   --   --   --   --   --   TSH  --  1.207  --   --  1.088  --   --   BNP  --   --   --   --   --  979.3*  --     ------------------------------------------------------------------------------------------------------------------ No results for input(s): "CHOL", "HDL", "LDLCALC", "TRIG", "CHOLHDL", "LDLDIRECT" in the last 72 hours.  Lab Results  Component Value Date   HGBA1C 4.6 (L) 07/07/2021    Recent Labs    10/31/21 1648  TSH 1.088   ------------------------------------------------------------------------------------------------------------------ ID Labs Recent Labs  Lab 10/31/21 0338 10/31/21 0920 10/31/21 1648 11/01/21 0528 11/02/21 0459  WBC 7.1  --   --  9.4 9.8  PLT 190  --   --  193 209  CREATININE 1.61* 1.40* 1.28* 1.17 1.07   Cardiac Enzymes No results for input(s): "CKMB", "TROPONINI", "MYOGLOBIN" in the last 168 hours.  Invalid input(s): "CK"   Radiology Reports DG Abd Portable 1V  Result Date: 11/01/2021 CLINICAL DATA:  Abdominal pain. EXAM: PORTABLE ABDOMEN - 1 VIEW COMPARISON:  10/30/2021  FINDINGS: The bowel gas pattern is normal. No radio-opaque calculi or other significant radiographic abnormality are seen. IMPRESSION: Negative.  Electronically Signed   By: Signa Kell M.D.   On: 11/01/2021 07:36   US RENAL  Result Date: 10/31/2021 CLINICAL DATA:  66 year old male with acute renal injury. EXAM: RENAL / URINARY TRACT ULTRASOUND COMPLETE COMPARISON:  CT Abdomen and Pelvis 10/30/2021. FINDINGS: Right Kidney: Renal measurements: 11.4 x 5.4 x 5.6 cm = volume: 114 mL. Echogenicity within normal limits. No solid mass or hydronephrosis visualized. Small 9 mm benign appearing cortical cyst (image 7, no follow-up imaging recommended). Left Kidney: Renal measurements: 10.1 x 5.4 x 5.8 cm = volume: 163 mL. Echogenicity within normal limits. No mass or hydronephrosis visualized. Bladder: Appears normal for degree of bladder distention. Other: Prostatomegaly, estimated prostate volume 51 mL. IMPRESSION: Negative ultrasound appearance of both kidneys and the urinary bladder. Electronically Signed   By: Odessa Fleming M.D.   On: 10/31/2021 09:36   MR BRAIN WO CONTRAST  Result Date: 10/31/2021 CLINICAL DATA:  Initial evaluation for acute mental status change. EXAM: MRI HEAD WITHOUT CONTRAST TECHNIQUE: Multiplanar, multiecho pulse sequences of the brain and surrounding structures were obtained without intravenous contrast. COMPARISON:  Prior CT from 10/30/2021 as well as previous MRI from 07/06/2021. FINDINGS: Brain: Examination technically limited as the patient was unable to tolerate the full length of the study. Diffusion-weighted sequences, axial T2 and FLAIR sequences, and sagittal T1 weighted sequences only were performed. Additionally, images are intermittently moderately to severely degraded by motion artifact. Cerebral volume within normal limits. No visible significant cerebral white matter disease for age. No abnormal foci of restricted diffusion to suggest acute or subacute ischemia. Tiny remote right  cerebellar infarct noted (series 10, image 2). No other visible areas of chronic cortical infarction. No visible mass lesion, mass effect or midline shift. Ventricles normal size without hydrocephalus. No extra-axial fluid collection. Vascular: Major intracranial vascular flow voids are grossly maintained at the skull base. Skull and upper cervical spine: Craniocervical junction not well assessed on this limited exam. Bone marrow signal intensity grossly within normal limits. No visible soft tissue abnormality. Sinuses/Orbits: Globes orbital soft tissues grossly within normal limits. Scattered mucosal thickening noted about the ethmoidal air cells. Superimposed right posterior ethmoidal sinus mucocele versus retention cyst. No significant mastoid effusion. Other: None. IMPRESSION: 1. Technically limited exam due to the patient's inability to tolerate the full length of the study and extensive motion artifact. 2. Grossly negative brain MRI. No acute intracranial infarct. No other definite acute intracranial abnormality. 3. Tiny remote right cerebellar infarct. Electronically Signed   By: Rise Mu M.D.   On: 10/31/2021 02:14   CT ABDOMEN PELVIS W CONTRAST  Result Date: 10/30/2021 CLINICAL DATA:  Abdominal pain. EXAM: CT ABDOMEN AND PELVIS WITH CONTRAST TECHNIQUE: Multidetector CT imaging of the abdomen and pelvis was performed using the standard protocol following bolus administration of intravenous contrast. RADIATION DOSE REDUCTION: This exam was performed according to the departmental dose-optimization program which includes automated exposure control, adjustment of the mA and/or kV according to patient size and/or use of iterative reconstruction technique. CONTRAST:  30mL OMNIPAQUE IOHEXOL 300 MG/ML  SOLN COMPARISON:  None Available. FINDINGS: Lower chest: Mild atelectasis is seen within the posterior aspect of the bilateral lung bases. Hepatobiliary: No focal liver abnormality is seen. No  gallstones, gallbladder wall thickening, or biliary dilatation. Pancreas: Unremarkable. No pancreatic ductal dilatation or surrounding inflammatory changes. Spleen: Normal in size without focal abnormality. Adrenals/Urinary Tract: Adrenal glands are unremarkable. Kidneys are normal in size, without renal calculi or hydronephrosis. A 6 mm cystic appearing area is  seen within the medial aspect of the mid to lower right kidney. Bladder is unremarkable. Stomach/Bowel: Stomach is within normal limits. Appendix appears normal (best seen on coronal reformatted images 60 through 88, CT series 7). No evidence of bowel wall thickening, distention, or inflammatory changes. Noninflamed diverticula are seen throughout the descending and sigmoid colon. Vascular/Lymphatic: Aortic atherosclerosis. No enlarged abdominal or pelvic lymph nodes. Reproductive: There is moderate to marked severity prostate gland enlargement. Other: No abdominal wall hernia or abnormality. No abdominopelvic ascites. Musculoskeletal: Degenerative changes are seen throughout the lumbar spine. IMPRESSION: 1. Colonic diverticulosis. 2. Moderate to marked severity prostate gland enlargement. Correlation with PSA values is recommended. Aortic Atherosclerosis (ICD10-I70.0). Electronically Signed   By: Aram Candela M.D.   On: 10/30/2021 19:56   CT Head Wo Contrast  Result Date: 10/30/2021 CLINICAL DATA:  Altered mental status, unknown cause. EXAM: CT HEAD WITHOUT CONTRAST TECHNIQUE: Contiguous axial images were obtained from the base of the skull through the vertex without intravenous contrast. RADIATION DOSE REDUCTION: This exam was performed according to the departmental dose-optimization program which includes automated exposure control, adjustment of the mA and/or kV according to patient size and/or use of iterative reconstruction technique. COMPARISON:  Head CT 07/07/2021, brain MRI 07/06/2021 FINDINGS: Brain: No intracranial hemorrhage, mass effect,  or midline shift. No hydrocephalus. The basilar cisterns are patent. No evidence of territorial infarct or acute ischemia. No extra-axial or intracranial fluid collection. Vascular: Atherosclerosis of skullbase vasculature without hyperdense vessel or abnormal calcification. Skull: No fracture or focal lesion. Sinuses/Orbits: Minimal opacification of a posterior right ethmoid air cell. No mastoid effusion. Unremarkable appearance to the orbits. Other: None. IMPRESSION: Unremarkable noncontrast head CT. No acute intracranial abnormality. Electronically Signed   By: Narda Rutherford M.D.   On: 10/30/2021 19:55

## 2021-11-02 NOTE — NC FL2 (Signed)
[place] MEDICAID FL2 LEVEL OF CARE SCREENING TOOL     IDENTIFICATION  Patient Name: Matthew [name] Birthdate: [birth year] Sex: male Admission Date (Current Location): 10/30/2021  [facility] and [place] Number:  Producer, television/film/video and Address:  [facility], [address], [place], [place] [address]      Provider Number: [id number]  Attending Physician Name and Address:  Leroy Sea, MD  Relative Name and Phone Number:  Darl Pikes (spouse) [phone]    Current Level of Care: Hospital Recommended Level of Care: Skilled Nursing Facility Prior Approval Number:    Date Approved/Denied:   PASRR Number: PASRR under review  Discharge Plan: SNF    Current Diagnoses: Patient Active Problem List   Diagnosis Date Noted   AMS (altered mental status) 10/30/2021   AKI (acute kidney injury) (HCC) 10/30/2021   Enlarged prostate 10/30/2021   Hypokalemia 10/30/2021   CVA (cerebral vascular accident) (HCC) 07/05/2021   AVM (arteriovenous malformation) of colon 02/04/2021   Fall 05/04/2020   GERD 09/01/2009   HEMATOCHEZIA 06/08/2009   ARM PAIN 12/24/2008   DYSESTHESIA 12/24/2008   ABDOMINAL PAIN 01/31/2008   ANXIETY 04/22/2007   PANIC ATTACK 04/22/2007   VERTIGO 04/22/2007   CHEST PAIN, UNSPECIFIED 04/22/2007   Bipolar I disorder (HCC) 02/21/2007   MIGRAINE VARIANTS, W/INTRACTABLE MIGRAINE 02/21/2007   NAUSEA WITH VOMITING 02/21/2007   U R I 02/18/2007   CONSTIPATION 02/18/2007   Essential hypertension 02/16/2007    Orientation RESPIRATION BLADDER Height & Weight     Self, Place  Normal Incontinent, External catheter (External Urinary Catheter) Weight: 132 lb 4.4 oz (60 kg) Height:  5\' 5"  (165.1 cm)  BEHAVIORAL SYMPTOMS/MOOD NEUROLOGICAL BOWEL NUTRITION STATUS      Continent (WDL) Diet (Please see discharge summary)  AMBULATORY STATUS COMMUNICATION OF NEEDS Skin   Limited Assist Verbally Other (Comment) (Appropriate for  ethnicity,dry,abrasion,arm,R,Ecchymosis,arm,hip,bilateral)                       Personal Care Assistance Level of Assistance  Bathing, Feeding, Dressing Bathing Assistance: Maximum assistance Feeding assistance: Independent (able to feed self) Dressing Assistance: Maximum assistance     Functional Limitations Info  Sight, Hearing, Speech Sight Info: Adequate (WDL) Hearing Info: Adequate (WDL) Speech Info: Adequate (WDL)    SPECIAL CARE FACTORS FREQUENCY  PT (By licensed PT), OT (By licensed OT)     PT Frequency: 5x min weekly OT Frequency: 5x min weekly            Contractures Contractures Info: Not present    Additional Factors Info  Code Status, Allergies Code Status Info: FULL Allergies Info: Sumatriptan,Tramadol           Current Medications (11/02/2021):  This is the current hospital active medication list Current Facility-Administered Medications  Medication Dose Route Frequency Provider Last Rate Last Admin   amLODipine (NORVASC) tablet 10 mg  10 mg Oral Daily 01/03/2022 K, MD   10 mg at 11/02/21 0927   dextrose 5 % with KCl 20 mEq / L  infusion  20 mEq Intravenous Continuous 01/03/22, MD 35 mL/hr at 11/02/21 0644 Infusion Verify at 11/02/21 0644   enoxaparin (LOVENOX) injection 40 mg  40 mg Subcutaneous Daily Tu, Ching T, DO   40 mg at 11/02/21 0827   famotidine (PEPCID) tablet 10 mg  10 mg Oral Daily 01/03/22, MD   10 mg at 11/02/21 0827   lamoTRIgine (LAMICTAL) tablet 150  mg  150 mg Oral BID Leroy Sea, MD   150 mg at 11/02/21 0827   ondansetron (ZOFRAN) injection 4 mg  4 mg Intravenous Q6H PRN Leroy Sea, MD   4 mg at 10/31/21 1747     Discharge Medications: Please see discharge summary for a list of discharge medications.  Relevant Imaging Results:  Relevant Lab Results:   Additional Information SSN-[id number], Both Covid Vaccines 1 booster  Delilah Shan, LCSWA

## 2021-11-02 NOTE — TOC Initial Note (Addendum)
Transition of Care [facility]) - Initial/Assessment Note    Patient Details  Name: Matthew [name] MRN: [id number] Date of Birth: 1955/07/11  Transition of Care [facility]) CM/SW Contact:    Delilah Shan, LCSWA Phone Number: 11/02/2021, 1:10 PM  Clinical Narrative:                  CSW received consult for possible SNF placement at time of discharge. Due to patients current orientation CSW spoke with patients spouse Darl Pikes regarding PT recommendation of SNF placement for patient at time of discharge. Patient comes from home with spouse.  Patients spouse expressed understanding of PT recommendation and is agreeable to SNF placement for patient at time of discharge. Patients spouse gave CSW permission to fax out initial referral near the [place] and [place] area.CSW discussed insurance authorization process with patients spouse.Patients spouse reports patient has received the COVID vaccines as well as 1 booster. No further questions reported at this time.Patients passr currently pending.CSW sent clinicals to [place] must for review. CSW to continue to follow and assist with discharge planning needs.   Expected Discharge Plan: Skilled Nursing Facility Barriers to Discharge: Continued Medical Work up   Patient Goals and CMS Choice   CMS Medicare.gov Compare Post Acute Care list provided to:: Patient Represenative (must comment) (Patients spouse Darl Pikes) Choice offered to / list presented to : Spouse (Patients spouse Darl Pikes)  Expected Discharge Plan and Services Expected Discharge Plan: Skilled Nursing Facility In-house Referral: Clinical Social Work     Living arrangements for the past 2 months: Single Family Home                                      Prior Living Arrangements/Services Living arrangements for the past 2 months: Single Family Home Lives with:: Spouse Patient language and need for interpreter reviewed:: Yes Do you feel safe going back to the place where you live?: No   SNF   Need for Family Participation in Patient Care: Yes (Comment) Care giver support system in place?: Yes (comment)   Criminal Activity/Legal Involvement Pertinent to Current Situation/Hospitalization: No - Comment as needed  Activities of Daily Living      Permission Sought/Granted Permission sought to share information with : Case Manager, Family Supports, Magazine features editor Permission granted to share information with : No  Share Information with NAME: Patient only oriented currently to person and place spoke with patients spouse Darl Pikes  Permission granted to share info w AGENCY: Patient only oriented currently to person and place spoke with patients spouse Susan/SNF  Permission granted to share info w Relationship: Patient only oriented currently to person and place spoke with patients spouse Darl Pikes  Permission granted to share info w Contact Information: Patient only oriented currently to person and place spoke with patients spouse Darl Pikes [phone]  Emotional Assessment       Orientation: : Oriented to Self, Oriented to Place Alcohol / Substance Use: Not Applicable Psych Involvement: No (comment)  Admission diagnosis:  AKI (acute kidney injury) (HCC) [N17.9] Altered mental status, unspecified altered mental status type [R41.82] AMS (altered mental status) [R41.82] Patient Active Problem List   Diagnosis Date Noted   AMS (altered mental status) 10/30/2021   AKI (acute kidney injury) (HCC) 10/30/2021   Enlarged prostate 10/30/2021   Hypokalemia 10/30/2021   CVA (cerebral vascular accident) (HCC) 07/05/2021   AVM (arteriovenous malformation) of colon 02/04/2021   Fall 05/04/2020  GERD 09/01/2009   HEMATOCHEZIA 06/08/2009   ARM PAIN 12/24/2008   DYSESTHESIA 12/24/2008   ABDOMINAL PAIN 01/31/2008   ANXIETY 04/22/2007   PANIC ATTACK 04/22/2007   VERTIGO 04/22/2007   CHEST PAIN, UNSPECIFIED 04/22/2007   Bipolar I disorder (HCC) 02/21/2007   MIGRAINE VARIANTS,  W/INTRACTABLE MIGRAINE 02/21/2007   NAUSEA WITH VOMITING 02/21/2007   U R I 02/18/2007   CONSTIPATION 02/18/2007   Essential hypertension 02/16/2007   PCP:  Soundra Pilon, FNP Pharmacy:   [facility] [phone] - [place], [place] - [address] AT [facility] [address] [place] [address] Phone: [phone] Fax: [phone]     Social Determinants of Health (SDOH) Interventions    Readmission Risk Interventions     No data to display

## 2021-11-02 NOTE — Evaluation (Signed)
Occupational Therapy Evaluation Patient Details Name: Matthew [name] MRN: [id number] DOB: June 29, 1955 Today's Date: 11/02/2021   History of Present Illness 66 yo male was admitted on 7/2 for AMS with disorientation and highly unusual behaviors including hallucinations and sleepiness. Pt's longtime psychiatrist in [place] had recently changed his medications. Found to have lithium toxicity and AKI.  PMHx:  bipolar disorder, HTN, migraines, substance abuse, Seizures, AVM of colon, OSA, panic attacks.   Clinical Impression   Pt was independent prior to admission. He enjoys spending time with his family and fishing. Limited evaluation this date with pt needing max assist for bed mobility to position for self feeding, min assist to eat and groom. Pt with good eye contact and interacting with therapist, per wife this is a big improvement from admission, with intermittent emotional lability. He is oriented to self and place, but not situation. He reports visual hallucinations of lions. Pt requires min to max assist for ADLs. Pt will need post acute rehab prior to return home with his supportive wife in his multilevel home. Recommending AIR. Will follow acutely.     Recommendations for follow up therapy are one component of a multi-disciplinary discharge planning process, led by the attending physician.  Recommendations may be updated based on patient status, additional functional criteria and insurance authorization.   Follow Up Recommendations  Acute inpatient rehab (3hours/day)    Assistance Recommended at Discharge Frequent or constant Supervision/Assistance  Patient can return home with the following A lot of help with walking and/or transfers;A lot of help with bathing/dressing/bathroom;Assistance with cooking/housework;Direct supervision/assist for medications management;Direct supervision/assist for financial management;Assist for transportation;Help with stairs or ramp for entrance;Assistance  with feeding    Functional Status Assessment  Patient has had a recent decline in their functional status and demonstrates the ability to make significant improvements in function in a reasonable and predictable amount of time.  Equipment Recommendations  BSC/3in1    Recommendations for Other Services       Precautions / Restrictions Precautions Precautions: Fall Restrictions Weight Bearing Restrictions: No      Mobility Bed Mobility Overal bed mobility: Needs Assistance             General bed mobility comments: max assist to reposition in bed for self feeding    Transfers                   General transfer comment: not tested      Balance Overall balance assessment: Needs assistance, History of Falls Sitting-balance support: Feet supported Sitting balance-Leahy Scale: Fair                                     ADL either performed or assessed with clinical judgement   ADL Overall ADL's : Needs assistance/impaired Eating/Feeding: Minimal assistance;Bed level   Grooming: Wash/dry face;Bed level;Set up                                 General ADL Comments: Limited evaluation     Vision Baseline Vision/History: 1 Wears glasses Patient Visual Report: No change from baseline Additional Comments: wife to bring glasses to hospital     Perception     Praxis      Pertinent Vitals/Pain Pain Assessment Pain Assessment: No/denies pain     Hand Dominance Right   Extremity/Trunk Assessment Upper Extremity  Assessment Upper Extremity Assessment: Overall WFL for tasks assessed (slowed movement)   Lower Extremity Assessment Lower Extremity Assessment: Defer to PT evaluation   Cervical / Trunk Assessment Cervical / Trunk Assessment: Normal   Communication Communication Communication: No difficulties   Cognition Arousal/Alertness: Awake/alert Behavior During Therapy:  (labile) Overall Cognitive Status:  Impaired/Different from baseline Area of Impairment: Orientation, Attention, Memory, Following commands, Safety/judgement, Awareness, Problem solving                 Orientation Level: Disoriented to, Time, Situation Current Attention Level: Sustained Memory: Decreased short-term memory Following Commands: Follows one step commands with increased time Safety/Judgement: Decreased awareness of deficits Awareness: Intellectual Problem Solving: Slow processing, Decreased initiation, Requires verbal cues General Comments: Pt with visual hallucinations of lions.     General Comments       Exercises     Shoulder Instructions      Home Living Family/patient expects to be discharged to:: Skilled nursing facility Living Arrangements: Spouse/significant other Available Help at Discharge: Family;Available 24 hours/day Type of Home: House Home Access: Level entry     Home Layout: Multi-level;Able to live on main level with bedroom/bathroom     Bathroom Shower/Tub: Producer, television/film/video: Standard     Home Equipment: Shower seat - built in;Grab bars - tub/shower   Additional Comments: wife endorses multiple level home      Prior Functioning/Environment Prior Level of Function : Independent/Modified Independent                        OT Problem List: Decreased strength;Impaired balance (sitting and/or standing);Decreased activity tolerance;Decreased cognition;Decreased coordination;Decreased knowledge of use of DME or AE      OT Treatment/Interventions: Self-care/ADL training;Therapeutic activities;Patient/family education;Balance training;DME and/or AE instruction;Cognitive remediation/compensation    OT Goals(Current goals can be found in the care plan section) Acute Rehab OT Goals OT Goal Formulation: With patient/family Time For Goal Achievement: 11/16/21 Potential to Achieve Goals: Good ADL Goals Pt Will Perform Eating: Independently;sitting Pt  Will Perform Grooming: with min guard assist;standing Pt Will Perform Upper Body Dressing: with supervision;sitting Pt Will Perform Lower Body Dressing: with min assist;sit to/from stand Pt Will Transfer to Toilet: with min assist;ambulating;bedside commode Pt Will Perform Toileting - Clothing Manipulation and hygiene: with min assist;sit to/from stand Additional ADL Goal #1: Pt will perform bed mobility with supervision in preparation for ADLs. Additional ADL Goal #2: Pt will follow 2 step commands with 75% accuracy.  OT Frequency: Min 2X/week    Co-evaluation              AM-PAC OT "6 Clicks" Daily Activity     Outcome Measure Help from another person eating meals?: A Little Help from another person taking care of personal grooming?: A Little Help from another person toileting, which includes using toliet, bedpan, or urinal?: A Lot Help from another person bathing (including washing, rinsing, drying)?: A Lot Help from another person to put on and taking off regular upper body clothing?: A Lot Help from another person to put on and taking off regular lower body clothing?: A Lot 6 Click Score: 14   End of Session    Activity Tolerance: Patient tolerated treatment well Patient left: in bed;with call bell/phone within reach;with bed alarm set;with family/visitor present  OT Visit Diagnosis: Muscle weakness (generalized) (M62.81);Other symptoms and signs involving cognitive function                Time:  4098-1191 OT Time Calculation (min): 33 min Charges:  OT General Charges $OT Visit: 1 Visit OT Evaluation $OT Eval Moderate Complexity: 1 Mod OT Treatments $Self Care/Home Management : 8-22 mins  Berna Spare, OTR/L Acute Rehabilitation Services Office: [phone]   Evern Bio 11/02/2021, 3:47 PM

## 2021-11-02 NOTE — Progress Notes (Addendum)
RE: Matthew [name]   Date of Birth: 08-Jan-1956  Date: 11/02/2021  To Whom It May Concern:  Please be advised that the above-named patient will require a short-term nursing home stay - anticipated 30 days or less for rehabilitation and strengthening. The plan is for return home.

## 2021-11-03 ENCOUNTER — Inpatient Hospital Stay (HOSPITAL_COMMUNITY): Payer: Medicare Other

## 2021-11-03 DIAGNOSIS — T56891A Toxic effect of other metals, accidental (unintentional), initial encounter: Secondary | ICD-10-CM | POA: Diagnosis not present

## 2021-11-03 DIAGNOSIS — N179 Acute kidney failure, unspecified: Secondary | ICD-10-CM | POA: Diagnosis not present

## 2021-11-03 DIAGNOSIS — I10 Essential (primary) hypertension: Secondary | ICD-10-CM | POA: Diagnosis not present

## 2021-11-03 DIAGNOSIS — F319 Bipolar disorder, unspecified: Secondary | ICD-10-CM | POA: Diagnosis not present

## 2021-11-03 DIAGNOSIS — R4182 Altered mental status, unspecified: Secondary | ICD-10-CM | POA: Diagnosis not present

## 2021-11-03 DIAGNOSIS — E876 Hypokalemia: Secondary | ICD-10-CM

## 2021-11-03 DIAGNOSIS — R569 Unspecified convulsions: Secondary | ICD-10-CM

## 2021-11-03 LAB — CBC WITH DIFFERENTIAL/PLATELET
Abs Immature Granulocytes: 0.05 10*3/uL (ref 0.00–0.07)
Basophils Absolute: 0.1 10*3/uL (ref 0.0–0.1)
Basophils Relative: 0 %
Eosinophils Absolute: 0.5 10*3/uL (ref 0.0–0.5)
Eosinophils Relative: 4 %
HCT: 38.7 % — ABNORMAL LOW (ref 39.0–52.0)
Hemoglobin: 14 g/dL (ref 13.0–17.0)
Immature Granulocytes: 0 %
Lymphocytes Relative: 17 %
Lymphs Abs: 2 10*3/uL (ref 0.7–4.0)
MCH: 30.9 pg (ref 26.0–34.0)
MCHC: 36.2 g/dL — ABNORMAL HIGH (ref 30.0–36.0)
MCV: 85.4 fL (ref 80.0–100.0)
Monocytes Absolute: 1.3 10*3/uL — ABNORMAL HIGH (ref 0.1–1.0)
Monocytes Relative: 11 %
Neutro Abs: 8.1 10*3/uL — ABNORMAL HIGH (ref 1.7–7.7)
Neutrophils Relative %: 68 %
Platelets: 243 10*3/uL (ref 150–400)
RBC: 4.53 MIL/uL (ref 4.22–5.81)
RDW: 11.9 % (ref 11.5–15.5)
WBC: 12 10*3/uL — ABNORMAL HIGH (ref 4.0–10.5)
nRBC: 0 % (ref 0.0–0.2)

## 2021-11-03 LAB — COMPREHENSIVE METABOLIC PANEL
ALT: 13 U/L (ref 0–44)
AST: 12 U/L — ABNORMAL LOW (ref 15–41)
Albumin: 3.5 g/dL (ref 3.5–5.0)
Alkaline Phosphatase: 58 U/L (ref 38–126)
Anion gap: 7 (ref 5–15)
BUN: 7 mg/dL — ABNORMAL LOW (ref 8–23)
CO2: 19 mmol/L — ABNORMAL LOW (ref 22–32)
Calcium: 9.7 mg/dL (ref 8.9–10.3)
Chloride: 112 mmol/L — ABNORMAL HIGH (ref 98–111)
Creatinine, Ser: 1.09 mg/dL (ref 0.61–1.24)
GFR, Estimated: >60 mL/min (ref >60)
Glucose, Bld: 117 mg/dL — ABNORMAL HIGH (ref 70–99)
Potassium: 3.8 mmol/L (ref 3.5–5.1)
Sodium: 138 mmol/L (ref 135–145)
Total Bilirubin: 0.8 mg/dL (ref 0.3–1.2)
Total Protein: 5.6 g/dL — ABNORMAL LOW (ref 6.5–8.1)

## 2021-11-03 LAB — LITHIUM LEVEL: Lithium Lvl: 0.5 mmol/L — ABNORMAL LOW (ref 0.60–1.20)

## 2021-11-03 LAB — MAGNESIUM: Magnesium: 2 mg/dL (ref 1.7–2.4)

## 2021-11-03 MED ORDER — LAMOTRIGINE 100 MG PO TABS
150.0000 mg | ORAL_TABLET | Freq: Two times a day (BID) | ORAL | Status: DC
  Administered 2021-11-03 – 2021-11-04 (×2): 150 mg via ORAL
  Filled 2021-11-03 (×2): qty 2

## 2021-11-03 MED ORDER — LAMOTRIGINE 100 MG PO TABS: 200.0000 mg | ORAL_TABLET | Freq: Two times a day (BID) | ORAL | Status: DC

## 2021-11-03 NOTE — Care Management Important Message (Signed)
Important Message  Patient Details  Name: Matthew [name] MRN: [id number] Date of Birth: 1956-02-05   Medicare Important Message Given:  Yes     Dorena Bodo 11/03/2021, 3:34 PM

## 2021-11-03 NOTE — Progress Notes (Signed)
IV consult placed for PIV access. Patient isn't currently receiving any IV medications at this time. Dr. Thedore Mins agreeable for patient to remain without PIV access for time being. If IV medications/fluids are ordered new VAT consult can be placed for team assessment.   Alain Deschene Loyola Mast, RN

## 2021-11-03 NOTE — Consult Note (Signed)
Matthew [name]   Service Date: November 03, 2021 LOS:  LOS: 3 days    Assessment  Matthew [name] is a 66 y.o. male admitted medically for 10/30/2021  4:35 PM for altered mental status. He carries the psychiatric diagnoses of bipolar disorder and has a past medical history of migraines and seizures.Psychiatry was consulted for toxic encephalopathy by Dr. Thedore Mins.      His initial presentation of disorganized thought, thought blocking, visual hallucinations, and elevated lithium levels is most consistent with toxic encephalopathy 2/2 lithium toxicity.  He is currently seeing a psychiatrist in [place] who prescribed Lamictal and Depakote.  It is unclear how he has been taking the medications.  At this time we will hold psychiatric medications except for Lamictal for seizure prophylaxis and bipolar disorder.    On today's [name], patient is doing better compared to prior assessments.  He was more responsive in conversation and was able to recall yesterday's events like his wife visiting. Continue with lamictal 150 mg BID and we will try to touch base with his OP psychiatrist regarding future medication management.  Diagnoses:  Active Hospital problems: Principal Problem:   AMS (altered mental status) Active Problems:   Bipolar I disorder (HCC)   Essential hypertension   AKI (acute kidney injury) (HCC)   Enlarged prostate   Hypokalemia     Plan  ## Safety and Observation Level:  - Based on my clinical [name], I estimate the patient to be at low risk of self harm in the current setting - At this time, we recommend a routine level of observation. This decision is based on my review of the chart including patient's history and current presentation, interview of the patient, mental status examination, and consideration of suicide risk including evaluating suicidal ideation, plan, intent, suicidal or self-harm behaviors, risk  factors, and protective factors. This judgment is based on our ability to directly address suicide risk, implement suicide prevention strategies and develop a safety plan while the patient is in the clinical setting. Please contact our team if there is a concern that risk level has changed.   ## Medications:  -- Continue with Lamictal mg BID for seizure prophylaxis and bipolar disorder   ## Medical Decision Making Capacity:  Not assessed   ## Further Work-up:  -- most recent EKG on July 4 had QtC of 421 -- Pertinent labwork reviewed earlier this admission includes: Lithium 1.75 -> 1.32->1.43->0.76->0.5   ## Disposition:  --Home with assistance to medication   ## Behavioral / Environmental:  --Referred to outpatient psychiatry   Thank you for this consult request. Recommendations have been communicated to the primary team.  We will continue to follow at this time.   [name], MD   Followup history  Relevant Aspects of Hospital Course:  Admitted on 10/30/2021 for toxic encephalopathy.  Patient Report:  Patient was seen this morning.  Patient said he feels good, no concerns with sleep or appetite.  Reports a headache that started an hour ago rating the severity of 6/10. He remembers his wife visiting him yesterday.  After the mental status exam, he apologized for not remembering some of the answers. Patient denied SI, HI, AVH.     Collateral information:  Not obtained today   Psychiatric History:  Information collected from wife.  The patient has a history of bipolar disorder.  Was hospitalized mid 81s.   Family psych history: Niece has reported bipolar disorder.     Social  History:    Tobacco use: Not assessed Alcohol use: Wife denies Drug use: Occasional use of marijuana  Family History:   The patient's family history includes Arthritis in an other family member; Cancer (age of onset: 26) in his father; Depression in his mother; Heart disease in his paternal  grandmother; Hypertension in his father and another family member.  Medical History: Past Medical History:  Diagnosis Date   Anxiety    on meds   AVM (arteriovenous malformation) of colon 02/04/2021   Bipolar I disorder, most recent episode (or current) mixed, moderate    GERD (gastroesophageal reflux disease)    Hypertension    on meds   Migraines    OSA (obstructive sleep apnea)    retested-bipap not needed   Panic attacks    Seizures (HCC)    last known 05/2020;   Substance abuse (HCC)     Surgical History: Past Surgical History:  Procedure Laterality Date   CARPOMETACARPEL (CMC) FUSION OF THUMB WITH AUTOGRAFT FROM RADIUS Bilateral    COLONOSCOPY  2011   [facility] at [facility]-   FINGER ARTHROPLASTY Left 06/17/2012   Procedure: FINGER ARTHROPLASTY;  Surgeon: Jodi Marble, MD;  Location: [facility];  Service: Orthopedics;  Laterality: Left;  left thumb suspension arthroplasty    HEMORRHOID SURGERY  2000   Dr Earlene Plater   TONSILLECTOMY     WISDOM TOOTH EXTRACTION      Medications:   Current Facility-Administered Medications:    amLODipine (NORVASC) tablet 10 mg, 10 mg, Oral, Daily, Susa Raring K, MD, 10 mg at 11/03/21 0848   enoxaparin (LOVENOX) injection 40 mg, 40 mg, Subcutaneous, Daily, Tu, Ching T, DO, 40 mg at 11/03/21 0848   famotidine (PEPCID) tablet 10 mg, 10 mg, Oral, Daily, Susa Raring K, MD, 10 mg at 11/03/21 0848   lamoTRIgine (LAMICTAL) tablet 150 mg, 150 mg, Oral, BID, Leroy Sea, MD, 150 mg at 11/03/21 0847   ondansetron (ZOFRAN) injection 4 mg, 4 mg, Intravenous, Q6H PRN, Leroy Sea, MD, 4 mg at 10/31/21 1747  Allergies: Allergies  Allergen Reactions   Sumatriptan Other (See Comments)     dizziness   Tramadol     Severe dizziness, restless leg, jittery       Objective  Vital signs:  Temp:  [98.1 F (36.7 C)-98.8 F (37.1 C)] 98.2 F (36.8 C) (07/06 0823) Pulse Rate:  [54-73] 66 (07/06 0823) Resp:  [11-25] 15  (07/06 0823) BP: (129-159)/(81-100) 159/95 (07/06 0823) SpO2:  [94 %-100 %] 100 % (07/06 4098)  Psychiatric Specialty Exam:  Presentation  General Appearance: Appropriate for Environment  Eye Contact:Fair  Speech:Clear and Coherent  Speech Volume:Normal  Handedness:No data recorded  Mood and Affect  Mood:Euphoric  Affect:Congruent   Thought Process  Thought Processes:Coherent  Descriptions of Associations:Intact  Orientation:Partial (did not know the year or month, knew his name and where he is)  Thought Content:Logical  History of Schizophrenia/Schizoaffective disorder:No data recorded Duration of Psychotic Symptoms:No data recorded Hallucinations:Hallucinations: None  Ideas of Reference:None  Suicidal Thoughts:Suicidal Thoughts: No  Homicidal Thoughts:Homicidal Thoughts: No   Sensorium  Memory:Immediate Good; Recent Fair; Remote Good  Judgment:Good  Insight:Good   Executive Functions  Concentration:Good  Attention Span:Good  Recall:Good  Fund of Knowledge:Good  Language:Good   Psychomotor Activity  Psychomotor Activity:Psychomotor Activity: Normal   Assets  Assets:No data recorded  Sleep  Sleep:Sleep: Good    Physical Exam: Physical Exam Constitutional:      Appearance: He is well-developed.  HENT:     Head: Atraumatic.  Pulmonary:     Effort: Pulmonary effort is normal.     Breath sounds: Normal breath sounds.  Abdominal:     General: Bowel sounds are normal.  Neurological:     General: No focal deficit present.     Mental Status: He is alert.   Review of Systems  Constitutional:  Negative for chills, fever and weight loss.  Cardiovascular:  Negative for chest pain.  Gastrointestinal:  Negative for nausea and vomiting.  Neurological:  Positive for headaches. Negative for tremors and weakness.  Psychiatric/Behavioral:  Negative for depression and suicidal ideas.    Blood pressure (!) 159/95, pulse 66, temperature 98.2  F (36.8 C), temperature source Oral, resp. rate 15, height 5\' 5"  (1.651 m), weight 60 kg, SpO2 100 %. Body mass index is 22.01 kg/m.

## 2021-11-03 NOTE — Progress Notes (Signed)
Physical Therapy Treatment Patient Details Name: Matthew [name] MRN: [id number] DOB: [birth year] Today's Date: 11/03/2021   History of Present Illness 66 yo male was admitted on 7/2 for AMS with disorientation and highly unusual behaviors including hallucinations and sleepiness. Pt's longtime psychiatrist in [place] had recently changed his medications. Found to have lithium toxicity and AKI.  PMHx:  bipolar disorder, HTN, migraines, substance abuse, Seizures, AVM of colon, OSA, panic attacks.    PT Comments    Pt received in supine, A&O to location/self and partially to situation, reporting mild hallucinations and with notable short term memory deficits and difficulty with environmental navigation/wayfinding. Pt able to progress to household distance gait trial with RW and consistent minA with heavy cues for posture, RW use and environmental awareness. Pt with improved activity tolerance and reports minimal fatigue, anticipate he would be a good candidate for higher intensity post-acute therapies if eligible, discussed with supervising PT Lorrin Goodell and updated below along with DME. Plan to perform stair navigation instruction and assess higher level balance with DGI next session. Pt continues to benefit from PT services to progress toward functional mobility goals, goals will need to be updated next session per pt progress has met transfer/gait goals but remains well below functional baseline.    Recommendations for follow up therapy are one component of a multi-disciplinary discharge planning process, led by the attending physician.  Recommendations may be updated based on patient status, additional functional criteria and insurance authorization.  Follow Up Recommendations  Acute inpatient rehab (3hours/day)     Assistance Recommended at Discharge Frequent or constant Supervision/Assistance  Patient can return home with the following A lot of help with walking and/or transfers;A lot of help with  bathing/dressing/bathroom;Assistance with cooking/housework;Assistance with feeding;Direct supervision/assist for medications management;Direct supervision/assist for financial management;Assist for transportation;Help with stairs or ramp for entrance   Equipment Recommendations  Rolling walker (2 wheels)    Recommendations for Other Services Rehab consult     Precautions / Restrictions Precautions Precautions: Fall Precaution Comments: waxing/waning AMS/hallucinations Restrictions Weight Bearing Restrictions: No     Mobility  Bed Mobility Overal bed mobility: Needs Assistance Bed Mobility: Supine to Sit     Supine to sit: Min guard     General bed mobility comments: increased time, use of bed features, HOB elevated    Transfers Overall transfer level: Needs assistance Equipment used: Rolling walker (2 wheels) Transfers: Sit to/from Stand Sit to Stand: Min assist           General transfer comment: from EOB>RW and RW>recliner, cues for UE placement needed, minA steadying assist    Ambulation/Gait Ambulation/Gait assistance: Min assist Gait Distance (Feet): 150 Feet Assistive device: Rolling walker (2 wheels) Gait Pattern/deviations: Decreased stride length, Narrow base of support, Decreased dorsiflexion - right, Decreased dorsiflexion - left, Drifts right/left Gait velocity: grossly <0.3 m/s     General Gait Details: mod cues for upright posture, step sequencing, RW proximity needed, pt tending to rounded shoulder/downward gaze; no dizziness reported, very slow pace; pt needs mod directional cues and unaware of how to return back to room without instruction.      Balance Overall balance assessment: Needs assistance, History of Falls Sitting-balance support: Feet supported, No upper extremity supported Sitting balance-Leahy Scale: Good     Standing balance support: Bilateral upper extremity supported, Reliant on assistive device for balance Standing  balance-Leahy Scale: Poor Standing balance comment: narrow BOS, heavily reliant on RW but needing minA at most to correct balance  Cognition Arousal/Alertness: Awake/alert Behavior During Therapy: WFL for tasks assessed/performed Overall Cognitive Status: Impaired/Different from baseline Area of Impairment: Orientation, Memory, Following commands, Safety/judgement, Awareness, Problem solving                 Orientation Level: Disoriented to, Time   Memory: Decreased short-term memory Following Commands: Follows one step commands consistently, Follows multi-step commands inconsistently Safety/Judgement: Decreased awareness of deficits, Decreased awareness of safety Awareness: Intellectual Problem Solving: Slow processing, Requires verbal cues General Comments: Pt reports visual deficits and that incorrect glasses are in room, but after glasses cleaned, he reports his vision is better. Discussed benefits of OOB to chair and pt agreeable to recliner and to use call bell, then immediately states "I'll just slide back to the bed now", needed reinforcement of importance of remaining OOB and up in chair for 1-2 hours at least. RN notified, pt chair alarm on for safety. Good following of simple commands, notable STM deficits and pt somewhat aware of cognitive changes from baseline.        Exercises      General Comments General comments (skin integrity, edema, etc.): BP 137/105 on R arm supine prior, then reading 155/122 on R arm post, so took BP again in L arm post-exertion and reading 135/104, RN notified of R arm elevated BPs; SpO2 WFL on RA and HR monitor malfunction during mobility      Pertinent Vitals/Pain Pain Assessment Pain Assessment: No/denies pain           PT Goals (current goals can now be found in the care plan section) Acute Rehab PT Goals Patient Stated Goal: to get better so I can get back to fishing and beekeeping. PT Goal  Formulation: Patient unable to participate in goal setting Time For Goal Achievement: 11/14/21 Progress towards PT goals: Progressing toward goals    Frequency    Min 3X/week      PT Plan Discharge plan needs to be updated;Equipment recommendations need to be updated       AM-PAC PT "6 Clicks" Mobility   Outcome Measure  Help needed turning from your back to your side while in a flat bed without using bedrails?: A Little Help needed moving from lying on your back to sitting on the side of a flat bed without using bedrails?: A Little Help needed moving to and from a bed to a chair (including a wheelchair)?: A Little Help needed standing up from a chair using your arms (e.g., wheelchair or bedside chair)?: A Little Help needed to walk in hospital room?: A Lot Help needed climbing 3-5 steps with a railing? : A Lot 6 Click Score: 16    End of Session Equipment Utilized During Treatment: Gait belt Activity Tolerance: Patient tolerated treatment well Patient left: in chair;with call bell/phone within reach;with chair alarm set Nurse Communication: Mobility status;Other (comment) (encouraged pt to sit up at least 1hr in chair, BP readings elevated on R arm after mobility, lower in L arm) PT Visit Diagnosis: Unsteadiness on feet (R26.81);History of falling (Z91.81);Difficulty in walking, not elsewhere classified (R26.2)     Time: 4196-2229 PT Time Calculation (min) (ACUTE ONLY): 43 min  Charges:  $Gait Training: 23-37 mins $Therapeutic Activity: 8-22 mins                     Angela Platner P., PTA Acute Rehabilitation Services Secure Chat Preferred 9a-5:30pm Office: [place] 11/03/2021, 4:08 PM

## 2021-11-03 NOTE — Progress Notes (Addendum)
PROGRESS NOTE                                                                                                                                                                                                             Patient Demographics:    Matthew [name], is a 66 y.o. male, DOB - 1955-08-03, UYQ:[id number]  Outpatient Primary MD for the patient is Soundra Pilon, FNP    LOS - 3  Admit date - 10/30/2021    Chief Complaint  Patient presents with   Abdominal Pain       Brief Narrative (HPI from H&P)   66 y.o. male with medical history significant of seizure, Bipolar disorder, HTN, migraines, substance abuse who presents with altered mental status.  He was noted to be acting confused by his wife, recently his psychiatrist in [place] had made multiple changes to his home medications, of note few months ago he was admitted to [facility] for possible seizure-like activity and at that time his Lamictal dose was increased.  Currently he is on multiple neurocognitive and psych medications with evidence of lithium toxicity in the ER.  He has been admitted for toxic encephalopathy and to be evaluated by psychiatry and neurology.   Subjective:   Patient in bed, appears comfortable, denies any headache, no fever, no chest pain or pressure, no shortness of breath , no abdominal pain. No new focal weakness.    Assessment  & Plan :    AMS (altered mental status) in a patient with bipolar disorder with Lithium OD causing toxic encephalopathy - Most likely from polypharmacy with underlying bipolar disorder/seizure. Pt has multiple neurocognitive medications on his list .  He had moderate to severe lithium toxicity, CT head unremarkable no focal deficits, supportive care for lithium toxicity with IV fluids, lithium levels have normalized and mental status has improved.  Defer further management to psych adjust his bipolar  medications.  Neurology has signed off.  If stable likely discharge in the next 1 to 2 days.   Latest Reference Range & Units 10/31/21 00:47 10/31/21 09:20 10/31/21 16:48 11/01/21 05:28 11/01/21 07:33 11/01/21 16:40 11/02/21 04:59  Lithium 0.60 - 1.20 mmol/L 2.50 (HH) 2.01 (HH) 1.75 (HH) 1.32 (H) 1.43 (H) 1.06 0.76    History of seizures.  Multiple neurocognitive and psych medications, recent admission for possible  seizure-like activity done by neurology.  DW neurology - for now continue Lamictal hold Depakote and Topamax for now. Wife also reported severe allergy to Depakote.  Hypokalemia - replaced.  Enlarged prostate - Incidental finding on CT abdomen/pelvis.  PSA obtained by ED PA within normal limits.  Wife is not aware of any urinary symptoms.  Outpatient urology follow-up to be arranged by PCP.  AKI (acute kidney injury) ([place]) on CKD 3 A - due to lithium toxicity resolved after IV fluids.  Stable renal ultrasound. Baseline Creat 1.3.    Essential hypertension - BP stable, start Norvasc and monitor.  Sinus bradycardia with intermittent runs of junctional rhythm.  Case discussed with cardiologist Dr. Virgina Jock, avoid nodal agents, lithium levels near normal now, was on Coreg to yesterday which was discontinued and he was given IV glucagon do not think that is playing a role, stable TSH.  Cardiology will see continue to monitor on telemetry.  Stable blood pressure and he is symptom-free from this standpoint.  This problem seems to have resolved.      Condition - Extremely Guarded  Family Communication  :   Called wife Manuela Schwartz [phone] updated 10/31/2021, called on 11/01/2021 at 10:33 AM.  Full mailbox, she was updated later in the hospital on 11/01/2021, called 11/02/2021 at 8:46 AM, went to answering machine with full mailbox.  Called 11/03/2021 at 11:50 AM.  Mailbox full.  Code Status :  Full  Consults  :  Psych, neuro  PUD Prophylaxis : pepcid   Procedures  :     MRI Brain - Non  acute  CT Abd Pelvis with IV contrast done in the ER - non acute, large prostate.  Renal ultrasound.      Disposition Plan  :    Status is: Observation  DVT Prophylaxis  :    enoxaparin (LOVENOX) injection 40 mg Start: 10/31/21 1000    Lab Results  Component Value Date   PLT 243 11/03/2021    Diet :  Diet Order             Diet regular Room service appropriate? Yes; Fluid consistency: Thin  Diet effective now                    Inpatient Medications  Scheduled Meds:  amLODipine  10 mg Oral Daily   enoxaparin (LOVENOX) injection  40 mg Subcutaneous Daily   famotidine  10 mg Oral Daily   lamoTRIgine  150 mg Oral BID   Continuous Infusions:   PRN Meds:.  Antibiotics  :    Anti-infectives (From admission, onward)    None        Time Spent in minutes  30   Lala Lund M.D on 11/03/2021 at 11:51 AM  To page go to [email or web address]   [facility] -  Office  [phone]  See all Orders from today for further details    Objective:   Vitals:   11/03/21 0000 11/03/21 0100 11/03/21 0200 11/03/21 0823  BP: (!) 133/99   (!) 159/95  Pulse: (!) 54 73 61 66  Resp: 20 (!) 22 20 15   Temp:    98.2 F (36.8 C)  TempSrc:    Oral  SpO2:    100%  Weight:      Height:        Wt Readings from Last 3 Encounters:  10/30/21 60 kg  07/14/21 67.1 kg  07/05/21 71.9 kg     Intake/Output Summary (Last 24 hours) at  11/03/2021 1151 Last data filed at 11/03/2021 0849 Gross per 24 hour  Intake 120 ml  Output 1300 ml  Net -1180 ml     Physical Exam  Awake Alert, No new F.N deficits, flat affect [place].AT,PERRAL Supple Neck, No JVD,   Symmetrical Chest wall movement, Good air movement bilaterally, CTAB RRR,No Gallops, Rubs or new Murmurs,  +ve B.Sounds, Abd Soft, No tenderness,   No Cyanosis, Clubbing or edema      Data Review:    CBC Recent Labs  Lab 10/31/21 0338 11/01/21 0528 11/02/21 0459 11/03/21 0447  WBC 7.1 9.4 9.8 12.0*  HGB  10.9* 10.9* 12.9* 14.0  HCT 32.8* 31.8* 36.4* 38.7*  PLT 190 193 209 243  MCV 91.1 89.6 86.3 85.4  MCH 30.3 30.7 30.6 30.9  MCHC 33.2 34.3 35.4 36.2*  RDW 11.7 11.7 11.6 11.9  LYMPHSABS  --  1.5 1.7 2.0  MONOABS  --  0.9 1.1* 1.3*  EOSABS  --  0.3 0.5 0.5  BASOSABS  --  0.0 0.0 0.1    Electrolytes Recent Labs  Lab 10/30/21 1720 10/31/21 0047 10/31/21 0338 10/31/21 0920 10/31/21 1648 11/01/21 0528 11/02/21 0459 11/03/21 0447  NA 138  --    < > 144 143 140 139 138  K 3.1*  --    < > 3.5 3.6 3.3* 3.6 3.8  CL 112*  --    < > 116* 119* 113* 111 112*  CO2 21*  --    < > 18* 20* 20* 20* 19*  GLUCOSE 86  --    < > 76 94 121* 140* 117*  BUN 42*  --    < > 30* 24* 19 9 7*  CREATININE 1.78*  --    < > 1.40* 1.28* 1.17 1.07 1.09  CALCIUM 9.6  --    < > 9.7 9.4 9.3 9.6 9.7  AST 15  --   --   --   --  15 15 12*  ALT 13  --   --   --   --  13 14 13   ALKPHOS 49  --   --   --   --  48 56 58  BILITOT 0.8  --   --   --   --  1.0 0.8 0.8  ALBUMIN 3.7  --   --   --   --  3.2* 3.4* 3.5  MG  --  2.3  --   --   --  2.0 1.8 2.0  INR 1.1  --   --   --   --   --   --   --   TSH  --  1.207  --   --  1.088  --   --   --   BNP  --   --   --   --   --  979.3*  --   --    < > = values in this interval not displayed.    ------------------------------------------------------------------------------------------------------------------ No results for input(s): "CHOL", "HDL", "LDLCALC", "TRIG", "CHOLHDL", "LDLDIRECT" in the last 72 hours.  Lab Results  Component Value Date   HGBA1C 4.6 (L) 07/07/2021    Recent Labs    10/31/21 1648  TSH 1.088   ------------------------------------------------------------------------------------------------------------------ ID Labs Recent Labs  Lab 10/31/21 0338 10/31/21 0920 10/31/21 1648 11/01/21 0528 11/02/21 0459 11/03/21 0447  WBC 7.1  --   --  9.4 9.8 12.0*  PLT 190  --   --  193  209 243  CREATININE 1.61* 1.40* 1.28* 1.17 1.07 1.09   Cardiac  Enzymes No results for input(s): "CKMB", "TROPONINI", "MYOGLOBIN" in the last 168 hours.  Invalid input(s): "CK"   Radiology Reports DG Chest Port 1 View  Result Date: 11/03/2021 CLINICAL DATA:  Increase in heart rate.  Seizures and hypertension EXAM: PORTABLE CHEST 1 VIEW COMPARISON:  05/04/2020 FINDINGS: Artifact from EKG leads. Stable heart size and mediastinal contours with heart size accentuated by low volume lungs. There is no edema, consolidation, effusion, or pneumothorax. IMPRESSION: No evidence of active disease. Electronically Signed   By: Jorje Guild M.D.   On: 11/03/2021 07:18   DG Abd Portable 1V  Result Date: 11/01/2021 CLINICAL DATA:  Abdominal pain. EXAM: PORTABLE ABDOMEN - 1 VIEW COMPARISON:  10/30/2021 FINDINGS: The bowel gas pattern is normal. No radio-opaque calculi or other significant radiographic abnormality are seen. IMPRESSION: Negative. Electronically Signed   By: Kerby Moors M.D.   On: 11/01/2021 07:36   US RENAL  Result Date: 10/31/2021 CLINICAL DATA:  66 year old male with acute renal injury. EXAM: RENAL / URINARY TRACT ULTRASOUND COMPLETE COMPARISON:  CT Abdomen and Pelvis 10/30/2021. FINDINGS: Right Kidney: Renal measurements: 11.4 x 5.4 x 5.6 cm = volume: 114 mL. Echogenicity within normal limits. No solid mass or hydronephrosis visualized. Small 9 mm benign appearing cortical cyst (image 7, no follow-up imaging recommended). Left Kidney: Renal measurements: 10.1 x 5.4 x 5.8 cm = volume: 163 mL. Echogenicity within normal limits. No mass or hydronephrosis visualized. Bladder: Appears normal for degree of bladder distention. Other: Prostatomegaly, estimated prostate volume 51 mL. IMPRESSION: Negative ultrasound appearance of both kidneys and the urinary bladder. Electronically Signed   By: Genevie Ann M.D.   On: 10/31/2021 09:36   MR BRAIN WO CONTRAST  Result Date: 10/31/2021 CLINICAL DATA:  Initial evaluation for acute mental status change. EXAM: MRI HEAD WITHOUT  CONTRAST TECHNIQUE: Multiplanar, multiecho pulse sequences of the brain and surrounding structures were obtained without intravenous contrast. COMPARISON:  Prior CT from 10/30/2021 as well as previous MRI from 07/06/2021. FINDINGS: Brain: Examination technically limited as the patient was unable to tolerate the full length of the study. Diffusion-weighted sequences, axial T2 and FLAIR sequences, and sagittal T1 weighted sequences only were performed. Additionally, images are intermittently moderately to severely degraded by motion artifact. Cerebral volume within normal limits. No visible significant cerebral white matter disease for age. No abnormal foci of restricted diffusion to suggest acute or subacute ischemia. Tiny remote right cerebellar infarct noted (series 10, image 2). No other visible areas of chronic cortical infarction. No visible mass lesion, mass effect or midline shift. Ventricles normal size without hydrocephalus. No extra-axial fluid collection. Vascular: Major intracranial vascular flow voids are grossly maintained at the skull base. Skull and upper cervical spine: Craniocervical junction not well assessed on this limited exam. Bone marrow signal intensity grossly within normal limits. No visible soft tissue abnormality. Sinuses/Orbits: Globes orbital soft tissues grossly within normal limits. Scattered mucosal thickening noted about the ethmoidal air cells. Superimposed right posterior ethmoidal sinus mucocele versus retention cyst. No significant mastoid effusion. Other: None. IMPRESSION: 1. Technically limited exam due to the patient's inability to tolerate the full length of the study and extensive motion artifact. 2. Grossly negative brain MRI. No acute intracranial infarct. No other definite acute intracranial abnormality. 3. Tiny remote right cerebellar infarct. Electronically Signed   By: Jeannine Boga M.D.   On: 10/31/2021 02:14   CT ABDOMEN PELVIS W CONTRAST  Result Date:  10/30/2021 CLINICAL DATA:  Abdominal pain. EXAM: CT ABDOMEN AND PELVIS WITH CONTRAST TECHNIQUE: Multidetector CT imaging of the abdomen and pelvis was performed using the standard protocol following bolus administration of intravenous contrast. RADIATION DOSE REDUCTION: This exam was performed according to the departmental dose-optimization program which includes automated exposure control, adjustment of the mA and/or kV according to patient size and/or use of iterative reconstruction technique. CONTRAST:  25mL OMNIPAQUE IOHEXOL 300 MG/ML  SOLN COMPARISON:  None Available. FINDINGS: Lower chest: Mild atelectasis is seen within the posterior aspect of the bilateral lung bases. Hepatobiliary: No focal liver abnormality is seen. No gallstones, gallbladder wall thickening, or biliary dilatation. Pancreas: Unremarkable. No pancreatic ductal dilatation or surrounding inflammatory changes. Spleen: Normal in size without focal abnormality. Adrenals/Urinary Tract: Adrenal glands are unremarkable. Kidneys are normal in size, without renal calculi or hydronephrosis. A 6 mm cystic appearing area is seen within the medial aspect of the mid to lower right kidney. Bladder is unremarkable. Stomach/Bowel: Stomach is within normal limits. Appendix appears normal (best seen on coronal reformatted images 60 through 88, CT series 7). No evidence of bowel wall thickening, distention, or inflammatory changes. Noninflamed diverticula are seen throughout the descending and sigmoid colon. Vascular/Lymphatic: Aortic atherosclerosis. No enlarged abdominal or pelvic lymph nodes. Reproductive: There is moderate to marked severity prostate gland enlargement. Other: No abdominal wall hernia or abnormality. No abdominopelvic ascites. Musculoskeletal: Degenerative changes are seen throughout the lumbar spine. IMPRESSION: 1. Colonic diverticulosis. 2. Moderate to marked severity prostate gland enlargement. Correlation with PSA values is recommended.  Aortic Atherosclerosis (ICD10-I70.0). Electronically Signed   By: Virgina Norfolk M.D.   On: 10/30/2021 19:56   CT Head Wo Contrast  Result Date: 10/30/2021 CLINICAL DATA:  Altered mental status, unknown cause. EXAM: CT HEAD WITHOUT CONTRAST TECHNIQUE: Contiguous axial images were obtained from the base of the skull through the vertex without intravenous contrast. RADIATION DOSE REDUCTION: This exam was performed according to the departmental dose-optimization program which includes automated exposure control, adjustment of the mA and/or kV according to patient size and/or use of iterative reconstruction technique. COMPARISON:  Head CT 07/07/2021, brain MRI 07/06/2021 FINDINGS: Brain: No intracranial hemorrhage, mass effect, or midline shift. No hydrocephalus. The basilar cisterns are patent. No evidence of territorial infarct or acute ischemia. No extra-axial or intracranial fluid collection. Vascular: Atherosclerosis of skullbase vasculature without hyperdense vessel or abnormal calcification. Skull: No fracture or focal lesion. Sinuses/Orbits: Minimal opacification of a posterior right ethmoid air cell. No mastoid effusion. Unremarkable appearance to the orbits. Other: None. IMPRESSION: Unremarkable noncontrast head CT. No acute intracranial abnormality. Electronically Signed   By: Keith Rake M.D.   On: 10/30/2021 19:55

## 2021-11-03 NOTE — Progress Notes (Signed)
Neurology Progress Note   S:// Patient is awake and alert sitting in bed. Wife at  bedside. Follows commands and appropriately engages in conversation.   O:// Current vital signs: BP (!) 118/91 (BP Location: Left Arm)   Pulse 79   Temp 98.5 F (36.9 C) (Oral)   Resp 14   Ht 5\' 5"  (1.651 m)   Wt 60 kg   SpO2 100%   BMI 22.01 kg/m  Vital signs in last 24 hours: Temp:  [97.3 F (36.3 C)-98.5 F (36.9 C)] 98.5 F (36.9 C) (07/06 1621) Pulse Rate:  [54-79] 79 (07/06 1621) Resp:  [14-22] 14 (07/06 1621) BP: (118-159)/(91-104) 118/91 (07/06 1621) SpO2:  [98 %-100 %] 100 % (07/06 1621)  GENERAL: Awake, alert in NAD HEENT: - Normocephalic and atraumatic, dry mm LUNGS - Clear to auscultation bilaterally with no wheezes CV - S1S2 RRR, no m/r/g, equal pulses bilaterally. ABDOMEN - Soft, nontender, nondistended with normoactive BS Ext: warm, well perfused, intact peripheral pulses, no edema  NEURO:  Mental Status: AA&Ox self, age and place. state city, current year, month. Able to follow commands and name objects.  Language: speech is clear, delayed and hypophonic Cranial Nerves: PERRL  3 mm/brisk. EOMI, visual fields full, no facial asymmetry, facial sensation intact, hearing intact, tongue/uvula/soft palate midline, normal sternocleidomastoid and trapezius muscle strength. No evidence of tongue atrophy or fibrillations Motor: right upper 5/5, left upper 4/5- (c/o hurting), bilateral lowers 5/5 Tone: is normal and bulk is normal Sensation- Intact to light touch bilaterally Coordination: FTN intact bilaterally, no ataxia in BLE. Gait- deferred   Medications  Current Facility-Administered Medications:    amLODipine (NORVASC) tablet 10 mg, 10 mg, Oral, Daily, 08-20-1993 K, MD, 10 mg at 11/03/21 0848   enoxaparin (LOVENOX) injection 40 mg, 40 mg, Subcutaneous, Daily, Tu, Ching T, DO, 40 mg at 11/03/21 0848   famotidine (PEPCID) tablet 10 mg, 10 mg, Oral, Daily, 01/04/22 K, MD, 10 mg at 11/03/21 0848   lamoTRIgine (LAMICTAL) tablet 150 mg, 150 mg, Oral, BID, 01/04/22, MD, 150 mg at 11/03/21 0847   ondansetron (ZOFRAN) injection 4 mg, 4 mg, Intravenous, Q6H PRN, 01/04/22, MD, 4 mg at 10/31/21 1747   Imaging I have reviewed images in epic and the results pertinent to this consultation are:  CT-scan of the brain 7/2 No acute process  MRI examination of the brain 7/3 1. Technically limited exam due to the patient's inability to tolerate the full length of the study and extensive motion artifact. 2. Grossly negative brain MRI. No acute intracranial infarct. No other definite acute intracranial abnormality. 3. Tiny remote right cerebellar infarct.  LABS: Lithium 2.50->2.01->1.75->1.32->1.43->1.06->0.76 VPA 42 Lamotrigine pending   Assessment:  66 y.o. male with medical history significant of seizure, Bipolar disorder, HTN, migraines, substance abuse who presents with altered mental status.  He was noted to be acting confused by his wife with 1- 2 weeks of progressive confusion falling and weakness  Toxic metabolic encephalopathy due to polypharmacy and Lithium OD Seizure disorder  Bipolar  Migraines  AKI Hypertension   Recommendations: - Continue Lamictal 150 twice daily for now, Increase Lamictal dosage to 200 mg q 12 hours starting 7/10, once valproic acid has been cleared. -continue Neuro checks - rest per primary team. - neurology will signoff. Please feel free to contact [phone] with any questions or concerns.  [place] [facility] Pager Number Erick [phone]

## 2021-11-03 NOTE — TOC Progression Note (Addendum)
Transition of Care [facility]) - Progression Note    Patient Details  Name: Matthew [name] MRN: [id number] Date of Birth: [birth year]  [facility] CM/SW Contact  Mearl Latin, LCSW Phone Number: 11/03/2021, 3:44 PM  Clinical Narrative:    1pm-CSW presented bed offers to patient's spouse at bedside. She is asking for [facility]. CSW called and requested [facility] review referral.  5pm-CSW made spouse aware that [facility] unable to accept patient. She has selected [facility]. CSW confirmed they have a bed for patient and initiated insurance process, Ref# [id number].    Expected Discharge Plan: Skilled Nursing Facility Barriers to Discharge: Continued Medical Work up  Expected Discharge Plan and Services Expected Discharge Plan: Skilled Nursing Facility In-house Referral: Clinical Social Work     Living arrangements for the past 2 months: Single Family Home                                       Social Determinants of Health (SDOH) Interventions    Readmission Risk Interventions     No data to display

## 2021-11-03 NOTE — Progress Notes (Signed)
  Inpatient Rehabilitation Admissions Coordinator   Per OT recommendations patient was screened for CIR candidacy by Ottie Glazier RN MSN. Current payor trends with [employer] medicare are unlikley to approve a CIR/AIR level rehab for this diagnosis. I will not place a Rehab Conuslt at this time. Recommend other rehab venues to be pursued.   Ottie Glazier, RN, MSN Rehab Admissions Coordinator [phone] 11/03/2021 3:59 PM

## 2021-11-04 ENCOUNTER — Other Ambulatory Visit (HOSPITAL_COMMUNITY): Payer: Self-pay

## 2021-11-04 DIAGNOSIS — R4182 Altered mental status, unspecified: Secondary | ICD-10-CM | POA: Diagnosis not present

## 2021-11-04 LAB — CBC WITH DIFFERENTIAL/PLATELET
Abs Immature Granulocytes: 0.05 10*3/uL (ref 0.00–0.07)
Basophils Absolute: 0.1 10*3/uL (ref 0.0–0.1)
Basophils Relative: 1 %
Eosinophils Absolute: 0.4 10*3/uL (ref 0.0–0.5)
Eosinophils Relative: 4 %
HCT: 36.6 % — ABNORMAL LOW (ref 39.0–52.0)
Hemoglobin: 12.8 g/dL — ABNORMAL LOW (ref 13.0–17.0)
Immature Granulocytes: 1 %
Lymphocytes Relative: 24 %
Lymphs Abs: 2.3 10*3/uL (ref 0.7–4.0)
MCH: 30.2 pg (ref 26.0–34.0)
MCHC: 35 g/dL (ref 30.0–36.0)
MCV: 86.3 fL (ref 80.0–100.0)
Monocytes Absolute: 1.2 10*3/uL — ABNORMAL HIGH (ref 0.1–1.0)
Monocytes Relative: 12 %
Neutro Abs: 5.8 10*3/uL (ref 1.7–7.7)
Neutrophils Relative %: 58 %
Platelets: 236 10*3/uL (ref 150–400)
RBC: 4.24 MIL/uL (ref 4.22–5.81)
RDW: 12 % (ref 11.5–15.5)
WBC: 9.8 10*3/uL (ref 4.0–10.5)
nRBC: 0 % (ref 0.0–0.2)

## 2021-11-04 LAB — COMPREHENSIVE METABOLIC PANEL
ALT: 11 U/L (ref 0–44)
AST: 11 U/L — ABNORMAL LOW (ref 15–41)
Albumin: 3.2 g/dL — ABNORMAL LOW (ref 3.5–5.0)
Alkaline Phosphatase: 52 U/L (ref 38–126)
Anion gap: 4 — ABNORMAL LOW (ref 5–15)
BUN: 14 mg/dL (ref 8–23)
CO2: 21 mmol/L — ABNORMAL LOW (ref 22–32)
Calcium: 9.6 mg/dL (ref 8.9–10.3)
Chloride: 112 mmol/L — ABNORMAL HIGH (ref 98–111)
Creatinine, Ser: 1.38 mg/dL — ABNORMAL HIGH (ref 0.61–1.24)
GFR, Estimated: 57 mL/min — ABNORMAL LOW (ref >60)
Glucose, Bld: 100 mg/dL — ABNORMAL HIGH (ref 70–99)
Potassium: 3.6 mmol/L (ref 3.5–5.1)
Sodium: 137 mmol/L (ref 135–145)
Total Bilirubin: 0.5 mg/dL (ref 0.3–1.2)
Total Protein: 5.3 g/dL — ABNORMAL LOW (ref 6.5–8.1)

## 2021-11-04 LAB — LITHIUM LEVEL: Lithium Lvl: 0.33 mmol/L — ABNORMAL LOW (ref 0.60–1.20)

## 2021-11-04 MED ORDER — LITHIUM CARBONATE 300 MG PO CAPS: 300.0000 mg | ORAL_CAPSULE | Freq: Every day | ORAL | 0 refills | Status: DC

## 2021-11-04 MED ORDER — CARVEDILOL 3.125 MG PO TABS: 3.1250 mg | ORAL_TABLET | Freq: Two times a day (BID) | ORAL | 0 refills | Status: DC

## 2021-11-04 MED ORDER — ZALEPLON 10 MG PO CAPS: 10.0000 mg | ORAL_CAPSULE | Freq: Every evening | ORAL | 0 refills | Status: DC | PRN

## 2021-11-04 MED ORDER — LITHIUM CARBONATE 300 MG PO CAPS
300.0000 mg | ORAL_CAPSULE | Freq: Two times a day (BID) | ORAL | Status: DC
  Filled 2021-11-04: qty 1

## 2021-11-04 MED ORDER — CLONAZEPAM 1 MG PO TABS: 1.0000 mg | ORAL_TABLET | Freq: Every day | ORAL | Status: DC | PRN

## 2021-11-04 MED ORDER — CLONAZEPAM 1 MG PO TABS: 1.0000 mg | ORAL_TABLET | Freq: Every day | ORAL | 0 refills | Status: DC | PRN

## 2021-11-04 MED ORDER — LAMOTRIGINE 200 MG PO TABS: 200.0000 mg | ORAL_TABLET | Freq: Two times a day (BID) | ORAL | 0 refills | Status: DC

## 2021-11-04 MED ORDER — CARVEDILOL 3.125 MG PO TABS: 3.1250 mg | ORAL_TABLET | Freq: Two times a day (BID) | ORAL | Status: DC

## 2021-11-04 MED ORDER — LAMOTRIGINE 150 MG PO TABS: 150.0000 mg | ORAL_TABLET | Freq: Two times a day (BID) | ORAL | 0 refills | Status: DC

## 2021-11-04 MED ORDER — AMLODIPINE BESYLATE 10 MG PO TABS: 10.0000 mg | ORAL_TABLET | Freq: Every day | ORAL | 0 refills | Status: AC

## 2021-11-04 MED ORDER — LITHIUM CARBONATE 300 MG PO CAPS: 300.0000 mg | ORAL_CAPSULE | Freq: Two times a day (BID) | ORAL | 0 refills | Status: DC

## 2021-11-04 NOTE — TOC Progression Note (Addendum)
Transition of Care [facility]) - Progression Note    Patient Details  Name: Matthew [name] MRN: [id number] Date of Birth: 08/10/1955  [facility]) CM/SW Contact  Lockie Pares, RN Phone Number: 11/04/2021, 11:16 AM  Clinical Narrative:     MD and TOC discussing disposition with patient and wife. It was decided he would go home with home health. Walker ordered from [facility]. Wife at bedside would like a agency for [facility] that will work with insurance.  [facility] for acceptance.- cannot service [phone]- cannot service Chip Boer- is checking- [address] accepted for service.   Expected Discharge Plan: Home w Home Health Services Barriers to Discharge: Continued Medical Work up  Expected Discharge Plan and Services Expected Discharge Plan: Home w Home Health Services In-house Referral: Clinical Social Work     Living arrangements for the past 2 months: Single Family Home                 DME Arranged: Walker rolling DME Agency: [facility] Date DME Agency Contacted: 11/04/21 Time DME Agency Contacted: [phone] Representative spoke with at DME Agency: [facility] HH Arranged: PT, OT, Disease Management, RN           Social Determinants of Health (SDOH) Interventions    Readmission Risk Interventions     No data to display

## 2021-11-04 NOTE — Discharge Instructions (Addendum)
Do not drive till you have been seen by your PCP and neurologist and cleared to do so.    Follow with Primary MD Soundra Pilon, FNP in 7 days also follow-up with local psychiatrist within a week.  Get CBC, CMP, magnesium, lithium level -  checked next visit within 1 week by Primary MD    Activity: As tolerated with Full fall precautions use walker/cane & assistance as needed  Disposition Home    Diet: Heart Healthy    Special Instructions: If you have smoked or chewed Tobacco  in the last 2 yrs please stop smoking, stop any regular Alcohol  and or any Recreational drug use.  On your next visit with your primary care physician please Get Medicines reviewed and adjusted.  Please request your Prim.MD to go over all Hospital Tests and Procedure/Radiological results at the follow up, please get all Hospital records sent to your Prim MD by signing hospital release before you go home.  If you experience worsening of your admission symptoms, develop shortness of breath, life threatening emergency, suicidal or homicidal thoughts you must seek medical attention immediately by calling 911 or calling your MD immediately  if symptoms less severe.  You Must read complete instructions/literature along with all the possible adverse reactions/side effects for all the Medicines you take and that have been prescribed to you. Take any new Medicines after you have completely understood and accpet all the possible adverse reactions/side effects.

## 2021-11-04 NOTE — Discharge Summary (Addendum)
GHALI MORISSETTE WUJ:[id number] DOB: 02-06-1956 DOA: 10/30/2021  PCP: Soundra Pilon, FNP  Admit date: 10/30/2021  Discharge date: 11/04/2021  Admitted From: Home   Disposition:  Home   Recommendations for Outpatient Follow-up:   Follow up with PCP in 1-2 weeks  PCP Please obtain BMP/CBC, 2 view CXR in 1week,  (see Discharge instructions)   PCP Please follow up on the following pending results: Monitor blood pressure, CBC, BMP and lipid serum levels closely.  Please check in within a week.   Home Health: PT, RN, aide if he qualifies Equipment/Devices: Environmental consultant Consultations: Neurology, psychiatry, cardiology Discharge Condition: Stable    CODE STATUS: Full    Diet Recommendation: Heart Healthy     Chief Complaint  Patient presents with   Abdominal Pain     Brief history of present illness from the day of admission and additional interim summary     66 y.o. male with medical history significant of seizure, Bipolar disorder, HTN, migraines, substance abuse who presents with altered mental status.  He was noted to be acting confused by his wife, recently his psychiatrist in [place] had made multiple changes to his home medications, of note few months ago he was admitted to [facility] for possible seizure-like activity and at that time his Lamictal dose was increased.  Currently he is on multiple neurocognitive and psych medications with evidence of lithium toxicity in the ER.  He has been admitted for toxic encephalopathy and to be evaluated by psychiatry and neurology.                                                                  Hospital Course     AMS (altered mental status) in a patient with bipolar disorder with Lithium OD causing toxic encephalopathy - Most likely from polypharmacy with  underlying bipolar disorder/seizure. Pt has multiple neurocognitive medications on his list .  He had moderate to severe lithium toxicity, CT head unremarkable no focal deficits, supportive care for lithium toxicity with IV fluids, lithium levels have completely normalized for several days now, mental status is close to baseline.  He was seen by psychiatry and neurology.  Current recommendation is to resume lithium at 300 QHS with outpatient monitoring by PCP within a week of discharge, also discharging him on lithium along with Klonopin and his home sleeping aid as before.  Discussed with wife bedside.  History of seizures.  Multiple neurocognitive and psych medications, recent admission for possible seizure-like activity done by neurology.  DW neurology - for now continue Lamictal hold Depakote and Topamax for now. Wife also reported severe allergy to Depakote. DC on Lamictal only, outpt Neuro follow.   Hypokalemia - replaced.   Enlarged prostate - Incidental finding on CT abdomen/pelvis.  PSA obtained by ED PA  within normal limits.  Wife is not aware of any urinary symptoms.  Outpatient urology follow-up to be arranged by PCP.   AKI (acute kidney injury) (HCC) on CKD 3 A - due to lithium toxicity resolved after IV fluids.  Stable renal ultrasound. Baseline Creat 1.3.     Essential hypertension - BP stable, and on Norvasc and low-dose Coreg.  PCP to monitor.   Sinus bradycardia with intermittent runs of junctional rhythm.  Case discussed with cardiologist Dr. Rosemary Holms, avoid nodal agents, lithium levels near normal now, was on Coreg to yesterday which was discontinued and he was given IV glucagon do not think that is playing a role, stable TSH.  Cardiology will see continue to monitor on telemetry.  Stable blood pressure and he is symptom-free from this standpoint.  This problem seems to have resolved.  Rechallenge with low-dose Coreg follow-up with PCP outpatient.  HX of ongoing marijuana use.   Counseled to quit.    Note wife had several concerns about previous documentation during his early encounters with [facility] system, was recommended to discuss this with the providers who have placed these findings in the chart.  Also had concerns that no phone calls were made to her on her cell phone from the hospital office, she was visibly shown the hospital office outgoing phone call her ID with her cell phone on specific date and times, it turns out that she probably has a bad spot in her house where she does not have incoming phone signal.   Discharge diagnosis     Principal Problem:   AMS (altered mental status) Active Problems:   Bipolar I disorder (HCC)   Essential hypertension   AKI (acute kidney injury) (HCC)   Enlarged prostate   Hypokalemia    Discharge instructions    Discharge Instructions     Diet - low sodium heart healthy   Complete by: As directed    Discharge instructions   Complete by: As directed    Do not drive till you have been seen by your PCP and neurologist and cleared to do so.    Follow with Primary MD Soundra Pilon, FNP in 7 days also follow-up with local psychiatrist within a week.  Get CBC, CMP, magnesium, lithium level -  checked next visit within 1 week by Primary MD    Activity: As tolerated with Full fall precautions use walker/cane & assistance as needed  Disposition Home    Diet: Heart Healthy    Special Instructions: If you have smoked or chewed Tobacco  in the last 2 yrs please stop smoking, stop any regular Alcohol  and or any Recreational drug use.  On your next visit with your primary care physician please Get Medicines reviewed and adjusted.  Please request your Prim.MD to go over all Hospital Tests and Procedure/Radiological results at the follow up, please get all Hospital records sent to your Prim MD by signing hospital release before you go home.  If you experience worsening of your admission symptoms, develop  shortness of breath, life threatening emergency, suicidal or homicidal thoughts you must seek medical attention immediately by calling 911 or calling your MD immediately  if symptoms less severe.  You Must read complete instructions/literature along with all the possible adverse reactions/side effects for all the Medicines you take and that have been prescribed to you. Take any new Medicines after you have completely understood and accpet all the possible adverse reactions/side effects.   Increase activity slowly  Complete by: As directed        Discharge Medications   Allergies as of 11/04/2021       Reactions   Sumatriptan Other (See Comments)    dizziness   Tramadol    Severe dizziness, restless leg, jittery        Medication List     STOP taking these medications    benazepril 20 MG tablet Commonly known as: LOTENSIN   divalproex 500 MG 24 hr tablet Commonly known as: DEPAKOTE ER   HYDROcodone-acetaminophen 7.5-325 MG tablet Commonly known as: NORCO   ibuprofen 200 MG tablet Commonly known as: ADVIL   QUEtiapine 400 MG tablet Commonly known as: SEROQUEL   rOPINIRole 1 MG tablet Commonly known as: REQUIP   topiramate 200 MG tablet Commonly known as: TOPAMAX       TAKE these medications    amLODipine 10 MG tablet Commonly known as: NORVASC Take 1 tablet (10 mg total) by mouth daily. Start taking on: November 05, 2021 What changed:  medication strength how much to take   carvedilol 3.125 MG tablet Commonly known as: COREG Take 1 tablet (3.125 mg total) by mouth 2 (two) times daily with a meal. What changed:  medication strength how much to take   clonazePAM 1 MG tablet Commonly known as: KLONOPIN Take 1 tablet (1 mg total) by mouth daily as needed (for severe anxiety or agitation). What changed:  medication strength how much to take reasons to take this   lamoTRIgine 150 MG tablet Commonly known as: LAMICTAL Take 1 tablet (150 mg total) by  mouth 2 (two) times daily for 2 days. What changed: Another medication with the same name was changed. Make sure you understand how and when to take each.   lamoTRIgine 200 MG tablet Commonly known as: LAMICTAL Take 1 tablet (200 mg total) by mouth 2 (two) times daily. Start taking on: November 07, 2021 What changed: These instructions start on November 07, 2021. If you are unsure what to do until then, ask your doctor or other care provider.   lithium carbonate 300 MG capsule Take 1 capsule (300 mg total) by mouth at bedtime. Must get lithium level checked by your PCP and psychiatrist within a week and then get future doses prescribed. What changed:  how much to take when to take this additional instructions   TUMS PO Take 1 tablet by mouth daily as needed (heartburn).   Vitamin D3 250 MCG (10000 UT) capsule Take 10,000 Units by mouth once a week.   zaleplon 10 MG capsule Commonly known as: SONATA Take 1 capsule (10 mg total) by mouth at bedtime as needed for sleep.               Durable Medical Equipment  (From admission, onward)           Start     Ordered   11/04/21 1107  For home use only DME Walker rolling  Once       Comments: 5 wheel  Question Answer Comment  Walker: With 5 Inch Wheels   Patient needs a walker to treat with the following condition Weakness      11/04/21 1106             Follow-up Information     Soundra Pilon, FNP. Schedule an appointment as soon as possible for a visit in 1 week(s).   Specialty: Family Medicine Why: Get CBC, BMP and lithium levels checked, get referral to local psychiatrist  within a week. Contact information: [address] [place] [address] [phone]         Cleotis Nipper, MD. Schedule an appointment as soon as possible for a visit in 1 week(s).   Specialty: Psychiatry Contact information: [address] [place] [address] [phone]                 Major procedures and  Radiology Reports - PLEASE review detailed and final reports thoroughly  -       DG Chest Port 1 View  Result Date: 11/03/2021 CLINICAL DATA:  Increase in heart rate.  Seizures and hypertension EXAM: PORTABLE CHEST 1 VIEW COMPARISON:  05/04/2020 FINDINGS: Artifact from EKG leads. Stable heart size and mediastinal contours with heart size accentuated by low volume lungs. There is no edema, consolidation, effusion, or pneumothorax. IMPRESSION: No evidence of active disease. Electronically Signed   By: Tiburcio Pea M.D.   On: 11/03/2021 07:18   DG Abd Portable 1V  Result Date: 11/01/2021 CLINICAL DATA:  Abdominal pain. EXAM: PORTABLE ABDOMEN - 1 VIEW COMPARISON:  10/30/2021 FINDINGS: The bowel gas pattern is normal. No radio-opaque calculi or other significant radiographic abnormality are seen. IMPRESSION: Negative. Electronically Signed   By: Signa Kell M.D.   On: 11/01/2021 07:36   US RENAL  Result Date: 10/31/2021 CLINICAL DATA:  66 year old male with acute renal injury. EXAM: RENAL / URINARY TRACT ULTRASOUND COMPLETE COMPARISON:  CT Abdomen and Pelvis 10/30/2021. FINDINGS: Right Kidney: Renal measurements: 11.4 x 5.4 x 5.6 cm = volume: 114 mL. Echogenicity within normal limits. No solid mass or hydronephrosis visualized. Small 9 mm benign appearing cortical cyst (image 7, no follow-up imaging recommended). Left Kidney: Renal measurements: 10.1 x 5.4 x 5.8 cm = volume: 163 mL. Echogenicity within normal limits. No mass or hydronephrosis visualized. Bladder: Appears normal for degree of bladder distention. Other: Prostatomegaly, estimated prostate volume 51 mL. IMPRESSION: Negative ultrasound appearance of both kidneys and the urinary bladder. Electronically Signed   By: Odessa Fleming M.D.   On: 10/31/2021 09:36   MR BRAIN WO CONTRAST  Result Date: 10/31/2021 CLINICAL DATA:  Initial evaluation for acute mental status change. EXAM: MRI HEAD WITHOUT CONTRAST TECHNIQUE: Multiplanar, multiecho pulse  sequences of the brain and surrounding structures were obtained without intravenous contrast. COMPARISON:  Prior CT from 10/30/2021 as well as previous MRI from 07/06/2021. FINDINGS: Brain: Examination technically limited as the patient was unable to tolerate the full length of the study. Diffusion-weighted sequences, axial T2 and FLAIR sequences, and sagittal T1 weighted sequences only were performed. Additionally, images are intermittently moderately to severely degraded by motion artifact. Cerebral volume within normal limits. No visible significant cerebral white matter disease for age. No abnormal foci of restricted diffusion to suggest acute or subacute ischemia. Tiny remote right cerebellar infarct noted (series 10, image 2). No other visible areas of chronic cortical infarction. No visible mass lesion, mass effect or midline shift. Ventricles normal size without hydrocephalus. No extra-axial fluid collection. Vascular: Major intracranial vascular flow voids are grossly maintained at the skull base. Skull and upper cervical spine: Craniocervical junction not well assessed on this limited exam. Bone marrow signal intensity grossly within normal limits. No visible soft tissue abnormality. Sinuses/Orbits: Globes orbital soft tissues grossly within normal limits. Scattered mucosal thickening noted about the ethmoidal air cells. Superimposed right posterior ethmoidal sinus mucocele versus retention cyst. No significant mastoid effusion. Other: None. IMPRESSION: 1. Technically limited exam due to the patient's inability to tolerate  the full length of the study and extensive motion artifact. 2. Grossly negative brain MRI. No acute intracranial infarct. No other definite acute intracranial abnormality. 3. Tiny remote right cerebellar infarct. Electronically Signed   By: Rise Mu M.D.   On: 10/31/2021 02:14   CT ABDOMEN PELVIS W CONTRAST  Result Date: 10/30/2021 CLINICAL DATA:  Abdominal pain. EXAM: CT  ABDOMEN AND PELVIS WITH CONTRAST TECHNIQUE: Multidetector CT imaging of the abdomen and pelvis was performed using the standard protocol following bolus administration of intravenous contrast. RADIATION DOSE REDUCTION: This exam was performed according to the departmental dose-optimization program which includes automated exposure control, adjustment of the mA and/or kV according to patient size and/or use of iterative reconstruction technique. CONTRAST:  43mL OMNIPAQUE IOHEXOL 300 MG/ML  SOLN COMPARISON:  None Available. FINDINGS: Lower chest: Mild atelectasis is seen within the posterior aspect of the bilateral lung bases. Hepatobiliary: No focal liver abnormality is seen. No gallstones, gallbladder wall thickening, or biliary dilatation. Pancreas: Unremarkable. No pancreatic ductal dilatation or surrounding inflammatory changes. Spleen: Normal in size without focal abnormality. Adrenals/Urinary Tract: Adrenal glands are unremarkable. Kidneys are normal in size, without renal calculi or hydronephrosis. A 6 mm cystic appearing area is seen within the medial aspect of the mid to lower right kidney. Bladder is unremarkable. Stomach/Bowel: Stomach is within normal limits. Appendix appears normal (best seen on coronal reformatted images 60 through 88, CT series 7). No evidence of bowel wall thickening, distention, or inflammatory changes. Noninflamed diverticula are seen throughout the descending and sigmoid colon. Vascular/Lymphatic: Aortic atherosclerosis. No enlarged abdominal or pelvic lymph nodes. Reproductive: There is moderate to marked severity prostate gland enlargement. Other: No abdominal wall hernia or abnormality. No abdominopelvic ascites. Musculoskeletal: Degenerative changes are seen throughout the lumbar spine. IMPRESSION: 1. Colonic diverticulosis. 2. Moderate to marked severity prostate gland enlargement. Correlation with PSA values is recommended. Aortic Atherosclerosis (ICD10-I70.0). Electronically  Signed   By: Aram Candela M.D.   On: 10/30/2021 19:56   CT Head Wo Contrast  Result Date: 10/30/2021 CLINICAL DATA:  Altered mental status, unknown cause. EXAM: CT HEAD WITHOUT CONTRAST TECHNIQUE: Contiguous axial images were obtained from the base of the skull through the vertex without intravenous contrast. RADIATION DOSE REDUCTION: This exam was performed according to the departmental dose-optimization program which includes automated exposure control, adjustment of the mA and/or kV according to patient size and/or use of iterative reconstruction technique. COMPARISON:  Head CT 07/07/2021, brain MRI 07/06/2021 FINDINGS: Brain: No intracranial hemorrhage, mass effect, or midline shift. No hydrocephalus. The basilar cisterns are patent. No evidence of territorial infarct or acute ischemia. No extra-axial or intracranial fluid collection. Vascular: Atherosclerosis of skullbase vasculature without hyperdense vessel or abnormal calcification. Skull: No fracture or focal lesion. Sinuses/Orbits: Minimal opacification of a posterior right ethmoid air cell. No mastoid effusion. Unremarkable appearance to the orbits. Other: None. IMPRESSION: Unremarkable noncontrast head CT. No acute intracranial abnormality. Electronically Signed   By: Narda Rutherford M.D.   On: 10/30/2021 19:55     Today   Subjective    Lauri Till today has no headache,no chest abdominal pain,no new weakness tingling or numbness, feels much better wants to go home today.     Objective   Blood pressure 140/90, pulse 85, temperature 98.5 F (36.9 C), temperature source Oral, resp. rate 16, height 5\' 5"  (1.651 m), weight 60 kg, SpO2 95 %.   Intake/Output Summary (Last 24 hours) at 11/04/2021 1230 Last data filed at 11/03/2021 1500 Gross per 24 hour  Intake 240 ml  Output 400 ml  Net -160 ml    Exam  Awake Alert, No new F.N deficits,    [place].AT,PERRAL Supple Neck,   Symmetrical Chest wall movement, Good air movement  bilaterally, CTAB RRR,No Gallops,   +ve B.Sounds, Abd Soft, Non tender,  No Cyanosis, Clubbing or edema    Data Review   Recent Labs  Lab 10/31/21 0338 11/01/21 0528 11/02/21 0459 11/03/21 0447 11/04/21 0038  WBC 7.1 9.4 9.8 12.0* 9.8  HGB 10.9* 10.9* 12.9* 14.0 12.8*  HCT 32.8* 31.8* 36.4* 38.7* 36.6*  PLT 190 193 209 243 236  MCV 91.1 89.6 86.3 85.4 86.3  MCH 30.3 30.7 30.6 30.9 30.2  MCHC 33.2 34.3 35.4 36.2* 35.0  RDW 11.7 11.7 11.6 11.9 12.0  LYMPHSABS  --  1.5 1.7 2.0 2.3  MONOABS  --  0.9 1.1* 1.3* 1.2*  EOSABS  --  0.3 0.5 0.5 0.4  BASOSABS  --  0.0 0.0 0.1 0.1    Recent Labs  Lab 10/30/21 1720 10/31/21 0047 10/31/21 0338 10/31/21 1648 11/01/21 0528 11/02/21 0459 11/03/21 0447 11/04/21 0038  NA 138  --    < > 143 140 139 138 137  K 3.1*  --    < > 3.6 3.3* 3.6 3.8 3.6  CL 112*  --    < > 119* 113* 111 112* 112*  CO2 21*  --    < > 20* 20* 20* 19* 21*  GLUCOSE 86  --    < > 94 121* 140* 117* 100*  BUN 42*  --    < > 24* 19 9 7* 14  CREATININE 1.78*  --    < > 1.28* 1.17 1.07 1.09 1.38*  CALCIUM 9.6  --    < > 9.4 9.3 9.6 9.7 9.6  AST 15  --   --   --  15 15 12* 11*  ALT 13  --   --   --  13 14 13 11   ALKPHOS 49  --   --   --  48 56 58 52  BILITOT 0.8  --   --   --  1.0 0.8 0.8 0.5  ALBUMIN 3.7  --   --   --  3.2* 3.4* 3.5 3.2*  MG  --  2.3  --   --  2.0 1.8 2.0  --   INR 1.1  --   --   --   --   --   --   --   TSH  --  1.207  --  1.088  --   --   --   --   BNP  --   --   --   --  979.3*  --   --   --    < > = values in this interval not displayed.    Total Time in preparing paper work, data evaluation and todays exam - 35 minutes  Susa RaringPrashant Joanny Dupree M.D on 11/04/2021 at 12:30 PM  [facility]

## 2021-11-04 NOTE — Consult Note (Signed)
Matthew [name]   Service Date: November 04, 2021 LOS:  LOS: 4 days    Assessment  Matthew [name] is a 66 y.o. male admitted medically for 10/30/2021  4:35 PM for altered mental status. He carries the psychiatric diagnoses of bipolar disorder and has a past medical history of migraines and seizures.Psychiatry was consulted for toxic encephalopathy by Dr. Candiss Norse.      His initial presentation of disorganized thought, thought blocking, visual hallucinations, and elevated lithium levels is most consistent with toxic encephalopathy 2/2 lithium toxicity.  He is currently seeing a psychiatrist in [place] who prescribed Lamictal and Depakote.  It is unclear how he has been taking the medications.    On today's [name], patient is closer to his baseline mentation compared to previous assessments. Discussed in length the risks and benefits with the different medication options. The patient has strong reservations regarding the risk of not being able to properly take his medication as prescribed. He prefers to continue taking the Lamictal and a low-dose of lithium for his treatment of bipolar disorder.  We discussed with the patient how Lithium 300 mg is not going to guarantee prevention of the bipolar disorder as its not in the therapeutic range, but having two mood stabilizers on board is more ideal than taking solely Lamictal. Since the patient has not had a reported manic episode for around 20 years and his wife can closely monitor his medication regimen, we feel that this medication regiment is acceptable so long as warning signs of a manic episode is watched for.    We educated the patient on the importance of good sleep hygiene and the different warning signs such as elevated mood, increased activity, increased impulsiveness, and increased talkativeness with flight of ideas in the manner that is nonsensical.  If these warning signs do arise, we  educated the patient and his wife to call the outpatient psychiatrist for further [name].  If this situation does occur, we recommend changing the medication with a more traditional regimen for bipolar disorder treatment (consider Lamictal and antipsychotic combination).   Patient can continue medications that can assist in sleep and agitation (Sonata and Klonopin, respectively) and these medications are more intended as occasional use and not daily as we do not want to increase the risk of benzodiazepine dependence and tolerance.  We recommend patient to be connected with an outpatient psychiatrist who is in the [facility] area.   Diagnoses:  Active Hospital problems: Principal Problem:   AMS (altered mental status) Active Problems:   Bipolar I disorder ([place])   Essential hypertension   AKI (acute kidney injury) ([place])   Enlarged prostate   Hypokalemia     Plan  ## Safety and Observation Level:  - Based on my clinical [name], I estimate the patient to be at low risk of self harm in the current setting - At this time, we recommend a routine level of observation. This decision is based on my review of the chart including patient's history and current presentation, interview of the patient, mental status examination, and consideration of suicide risk including evaluating suicidal ideation, plan, intent, suicidal or self-harm behaviors, risk factors, and protective factors. This judgment is based on our ability to directly address suicide risk, implement suicide prevention strategies and develop a safety plan while the patient is in the clinical setting. Please contact our team if there is a concern that risk level has changed.   ## Medications:  --  c/w Lamictal 150 mg BID for seizure prophylaxis and bipolar disorder -- start Lithium 300 mg qHS for bipolar disorder -- start Klonopin 1 mg PRN for agitation (5 pills/months)   ## Medical Decision Making Capacity:  Not assessed    ## Further Work-up:  -- most recent EKG on July 4 had QtC of 421 -- Pertinent labwork reviewed earlier this admission includes: Lithium 1.75 -> 1.32->1.43->0.76->0.5->0.33    ## Disposition:  -- home or SNF -- refer to outpatient psychiatry     Thank you for this consult request. Recommendations have been communicated to the primary team.  We will sign off at this time.   [name], MD   Followup history  Relevant Aspects of Hospital Course:  Admitted on 10/30/2021 for toxic encephalopathy.  Patient Report:  Patient was seen this morning sitting on the side of his bed and his wife is in the room.  Patient did not have concerns with his sleep or appetite.  Upon discussing the options of medications that he can take at discharge, he had reservations taking multiple medications due to the risk of incorrectly taking them as prescribed.  Patient was alert and oriented x3, knowing his name, the month, and where he is at.  Patient denied SI, HI, AVH.  Collateral information Spoke with patient's outpatient psychiatrist (Dr. Claria Dice) who discussed the patient's history of hypomanic episodes throughout the years that he has known him.  Patient has been seeing the psychiatrist for about 30 years.  He discussed his worry as the patient previously was a Lobbyist, but due to his psychiatric conditions he had difficulty with his stressors. Dr. Merilynn Finland feels that the patient should be on another medication other than Lamictal due to his history of hypomanic episodes lasting for weeks despite being on treatment.   Psychiatric History:  The patient has a history of bipolar disorder.  Was hospitalized mid 75s.   Family psych history: Niece has reported bipolar disorder.     Social History:    Tobacco use: Not assessed Alcohol use: Wife denies Drug use: Occasional use of marijuana   Family History:    The patient's family history includes Arthritis in an other  family member; Cancer (age of onset: 5) in his father; Depression in his mother; Heart disease in his paternal grandmother; Hypertension in his father and another family member.  Medical History: Past Medical History:  Diagnosis Date   Anxiety    on meds   AVM (arteriovenous malformation) of colon 02/04/2021   Bipolar I disorder, most recent episode (or current) mixed, moderate    GERD (gastroesophageal reflux disease)    Hypertension    on meds   Migraines    OSA (obstructive sleep apnea)    retested-bipap not needed   Panic attacks    Seizures (HCC)    last known 05/2020;   Substance abuse (HCC)     Surgical History: Past Surgical History:  Procedure Laterality Date   CARPOMETACARPEL ([facility]) FUSION OF THUMB WITH AUTOGRAFT FROM RADIUS Bilateral    COLONOSCOPY  2011   [facility]-   FINGER ARTHROPLASTY Left 06/17/2012   Procedure: FINGER ARTHROPLASTY;  Surgeon: Jodi Marble, MD;  Location: [facility];  Service: Orthopedics;  Laterality: Left;  left thumb suspension arthroplasty    HEMORRHOID SURGERY  2000   Dr Earlene Plater   TONSILLECTOMY     WISDOM TOOTH EXTRACTION      Medications:   Current Facility-Administered Medications:  amLODipine (NORVASC) tablet 10 mg, 10 mg, Oral, Daily, Susa Raring K, MD, 10 mg at 11/04/21 1008   clonazePAM (KLONOPIN) tablet 1 mg, 1 mg, Oral, Daily PRN, Leroy Sea, MD   enoxaparin (LOVENOX) injection 40 mg, 40 mg, Subcutaneous, Daily, Tu, Ching T, DO, 40 mg at 11/04/21 1007   famotidine (PEPCID) tablet 10 mg, 10 mg, Oral, Daily, Susa Raring K, MD, 10 mg at 11/04/21 1008   lamoTRIgine (LAMICTAL) tablet 150 mg, 150 mg, Oral, BID, 150 mg at 11/04/21 1008 **FOLLOWED BY** [START ON 11/07/2021] lamoTRIgine (LAMICTAL) tablet 200 mg, 200 mg, Oral, BID, Erick Blinks, MD   lithium carbonate capsule 300 mg, 300 mg, Oral, BID WC, Leroy Sea, MD   ondansetron [facility]) injection 4 mg, 4 mg, Intravenous, Q6H PRN,  Leroy Sea, MD, 4 mg at 10/31/21 1747  Allergies: Allergies  Allergen Reactions   Sumatriptan Other (See Comments)     dizziness   Tramadol     Severe dizziness, restless leg, jittery       Objective  Vital signs:  Temp:  [98 F (36.7 C)-98.8 F (37.1 C)] 98.8 F (37.1 C) (07/07 0839) Pulse Rate:  [71-95] 95 (07/07 0839) Resp:  [14-25] 25 (07/07 0839) BP: (118-166)/(91-104) 145/96 (07/07 0839) SpO2:  [100 %] 100 % (07/06 1621)  Psychiatric Specialty Exam:  Presentation  General Appearance: Appropriate for Environment  Eye Contact:Good  Speech:Clear and Coherent  Speech Volume:Normal  Handedness:No data recorded  Mood and Affect  Mood:Euphoric  Affect:Congruent   Thought Process  Thought Processes:Goal Directed; Coherent  Descriptions of Associations:Intact  Orientation:Full (Time, Place and Person)  Thought Content:Logical  History of Schizophrenia/Schizoaffective disorder:No data recorded Duration of Psychotic Symptoms:No data recorded Hallucinations:Hallucinations: None  Ideas of Reference:None  Suicidal Thoughts:Suicidal Thoughts: No  Homicidal Thoughts:Homicidal Thoughts: No   Sensorium  Memory:Immediate Good; Remote Good; Recent Good  Judgment:Good  Insight:Good   Executive Functions  Concentration:Good  Attention Span:Good  Recall:Good  Fund of Knowledge:Fair  Language:Good   Psychomotor Activity  Psychomotor Activity:Psychomotor Activity: Normal   Assets  Assets:No data recorded  Sleep  Sleep:Sleep: Good    Physical Exam: Physical Exam Constitutional:      Appearance: He is well-developed.  HENT:     Head: Atraumatic.  Pulmonary:     Effort: Pulmonary effort is normal.  Neurological:     General: No focal deficit present.     Mental Status: He is alert and oriented to person, place, and time.  Psychiatric:        Mood and Affect: Mood normal.        Behavior: Behavior normal.    Review of  Systems  Constitutional:  Negative for chills, fever and weight loss.  Neurological:  Negative for tremors, sensory change, speech change and weakness.  Psychiatric/Behavioral:  Negative for depression, hallucinations, substance abuse and suicidal ideas. The patient does not have insomnia.    Blood pressure (!) 145/96, pulse 95, temperature 98.8 F (37.1 C), temperature source Oral, resp. rate (!) 25, height 5\' 5"  (1.651 m), weight 60 kg, SpO2 100 %. Body mass index is 22.01 kg/m.

## 2021-11-04 NOTE — TOC Progression Note (Signed)
Transition of Care (TOC) - Progression Note    Patient Details  Name: Hyde T Brickley MRN: [id number] Date of Birth: 03/21/1956  Transition of Care (TOC) CM/SW Contact  Nadia S Rayyan, LCSW Phone Number: 11/04/2021, 1:42 PM  Clinical Narrative:    9am-CSW received voicemail from patient's spouse asking if placement was being sought through Medicaid and questions about Fl2 and options. CSW contacted all SNFs (including outside of [place]) but no additional bed offers found and left spouse a return voicemail explaining FL2 document. Will see patient after morning rounds.   11am-Per MD patient will discharge home with home health today instead of SNF. CSW and intern met with spouse and patient at bedside and confirmed plan for home health and need for walker. RNCM assisting in arranging home care.    Expected Discharge Plan: Home w Home Health Services Barriers to Discharge: Continued Medical Work up  Expected Discharge Plan and Services Expected Discharge Plan: Home w Home Health Services In-house Referral: Clinical Social Work     Living arrangements for the past 2 months: Single Family Home Expected Discharge Date: 11/04/21               DME Arranged: Walker rolling DME Agency: [employer] Date DME Agency Contacted: 11/04/21 Time DME Agency Contacted: 1115 Representative spoke with at DME Agency: [facility] HH Arranged: PT, OT, Disease Management, RN           Social Determinants of Health (SDOH) Interventions    Readmission Risk Interventions     No data to display          

## 2021-11-04 NOTE — Progress Notes (Signed)
Physical Therapy Treatment Patient Details Name: Matthew [name] MRN: [id number] DOB: [birth year] Today's Date: 11/04/2021   History of Present Illness Pt is a 66 y/o male admitted on 7/2 for AMS with disorientation and highly unusual behaviors including hallucinations and sleepiness. Pt's longtime psychiatrist in [place] had recently changed his medications. Found to have lithium toxicity and AKI.  PMHx:  bipolar disorder, HTN, migraines, Seizures, AVM of colon, OSA, panic attacks.    PT Comments    Pt continuing to make steady progress with his functional mobility. However, pt's wife in room and very concerned with plan now for pt to d/c home today as previously it was recommended that pt d/c to [facility]. Per [facility] AC's note on 7/6 - "Current payor trends with [facility] medicare are unlikley to approve a CIR/AIR level rehab for this diagnosis." Therefore PT follow-up recommendations have been updated. His wife stated that his hallucinations were consistent throughout the day yesterday and she is concerned with his safety if he is discharged home today, requesting another night in the hospital. Pt's RN present, aware and sent a message to MD and SW. Pt would continue to benefit from skilled physical therapy services at this time while admitted and after d/c to address the below listed limitations in order to improve overall safety and independence with functional mobility.   Recommendations for follow up therapy are one component of a multi-disciplinary discharge planning process, led by the attending physician.  Recommendations may be updated based on patient status, additional functional criteria and insurance authorization.  Follow Up Recommendations  Home health PT Can patient physically be transported by private vehicle: Yes   Assistance Recommended at Discharge Frequent or constant Supervision/Assistance  Patient can return home with the following A little help with walking and/or transfers;A little  help with bathing/dressing/bathroom;Assistance with cooking/housework;Direct supervision/assist for medications management;Help with stairs or ramp for entrance;Assist for transportation   Equipment Recommendations  Rolling walker (2 wheels);BSC/3in1    Recommendations for Other Services       Precautions / Restrictions Precautions Precautions: Fall Restrictions Weight Bearing Restrictions: No     Mobility  Bed Mobility Overal bed mobility: Needs Assistance Bed Mobility: Supine to Sit     Supine to sit: Supervision     General bed mobility comments: no difficulties, supervision for safety    Transfers Overall transfer level: Needs assistance Equipment used: Rolling walker (2 wheels) Transfers: Sit to/from Stand Sit to Stand: Min guard           General transfer comment: pt able to complete a sit-to-stand transfer from EOB with min guard and use of RW; he had a minor LOB posteriorly upon standing but was able to self-correct    Ambulation/Gait Ambulation/Gait assistance: Min guard, Min assist Gait Distance (Feet): 150 Feet Assistive device: Rolling walker (2 wheels) Gait Pattern/deviations: Step-through pattern, Decreased stride length, Shuffle, Trunk flexed Gait velocity: greatly decreased     General Gait Details: pt with very slow, cautious and guarded gait with a strong preference to maintain visual gaze inferiorly towards the floor with a flexed trunk. he demonstrated minor LOBs with directional changes and navigating around obstacles in which he required min A to recover   Stairs             Wheelchair Mobility    Modified Rankin (Stroke Patients Only)       Balance Overall balance assessment: Needs assistance Sitting-balance support: Feet supported Sitting balance-Leahy Scale: Good     Standing balance support:  During functional activity, Bilateral upper extremity supported, Single extremity supported Standing balance-Leahy Scale: Poor                               Cognition Arousal/Alertness: Awake/alert Behavior During Therapy: WFL for tasks assessed/performed Overall Cognitive Status: Impaired/Different from baseline Area of Impairment: Safety/judgement, Problem solving                         Safety/Judgement: Decreased awareness of safety, Decreased awareness of deficits   Problem Solving: Slow processing, Requires verbal cues, Requires tactile cues          Exercises      General Comments        Pertinent Vitals/Pain Pain Assessment Pain Assessment: No/denies pain    Home Living                          Prior Function            PT Goals (current goals can now be found in the care plan section) Acute Rehab PT Goals PT Goal Formulation: Patient unable to participate in goal setting Time For Goal Achievement: 11/14/21 Potential to Achieve Goals: Good Progress towards PT goals: Progressing toward goals    Frequency    Min 3X/week      PT Plan Discharge plan needs to be updated;Equipment recommendations need to be updated    Co-evaluation              AM-PAC PT "6 Clicks" Mobility   Outcome Measure  Help needed turning from your back to your side while in a flat bed without using bedrails?: None Help needed moving from lying on your back to sitting on the side of a flat bed without using bedrails?: None Help needed moving to and from a bed to a chair (including a wheelchair)?: A Little Help needed standing up from a chair using your arms (e.g., wheelchair or bedside chair)?: A Little Help needed to walk in hospital room?: A Little Help needed climbing 3-5 steps with a railing? : A Little 6 Click Score: 20    End of Session Equipment Utilized During Treatment: Gait belt Activity Tolerance: Patient tolerated treatment well Patient left: in bed;with call bell/phone within reach;with family/visitor present;Other (comment) (sitting upright at EOB  with wife and RN present) Nurse Communication: Mobility status PT Visit Diagnosis: Unsteadiness on feet (R26.81);History of falling (Z91.81);Difficulty in walking, not elsewhere classified (R26.2)     Time: 5784-6962 PT Time Calculation (min) (ACUTE ONLY): 41 min  Charges:  $Gait Training: 23-37 mins $Therapeutic Activity: 8-22 mins                     Arletta Bale, DPT  Acute Rehabilitation Services Office [phone]    Alessandra Bevels Mariavictoria Nottingham 11/04/2021, 10:46 AM

## 2021-11-04 NOTE — Progress Notes (Signed)
Pt discharged to home in stable condition.  All discharge instructions reviewed with and given to pt's wife.  Both pt and wife give complete verbal understanding.

## 2021-11-19 ENCOUNTER — Other Ambulatory Visit: Payer: Self-pay

## 2021-11-19 ENCOUNTER — Emergency Department (HOSPITAL_BASED_OUTPATIENT_CLINIC_OR_DEPARTMENT_OTHER): Payer: Medicare Other

## 2021-11-19 ENCOUNTER — Emergency Department (HOSPITAL_BASED_OUTPATIENT_CLINIC_OR_DEPARTMENT_OTHER)
Admission: EM | Admit: 2021-11-19 | Discharge: 2021-11-19 | Disposition: A | Discharge status: Home / Self Care | Payer: Medicare Other | Attending: Emergency Medicine | Admitting: Emergency Medicine

## 2021-11-19 ENCOUNTER — Encounter (HOSPITAL_BASED_OUTPATIENT_CLINIC_OR_DEPARTMENT_OTHER): Payer: Self-pay | Admitting: Emergency Medicine

## 2021-11-19 DIAGNOSIS — R1030 Lower abdominal pain, unspecified: Secondary | ICD-10-CM | POA: Diagnosis present

## 2021-11-19 DIAGNOSIS — R3911 Hesitancy of micturition: Secondary | ICD-10-CM | POA: Insufficient documentation

## 2021-11-19 LAB — BASIC METABOLIC PANEL
Anion gap: 3 — ABNORMAL LOW (ref 5–15)
BUN: 15 mg/dL (ref 8–23)
CO2: 26 mmol/L (ref 22–32)
Calcium: 8.8 mg/dL — ABNORMAL LOW (ref 8.9–10.3)
Chloride: 111 mmol/L (ref 98–111)
Creatinine, Ser: 1.08 mg/dL (ref 0.61–1.24)
GFR, Estimated: >60 mL/min (ref >60)
Glucose, Bld: 84 mg/dL (ref 70–99)
Potassium: 3.9 mmol/L (ref 3.5–5.1)
Sodium: 140 mmol/L (ref 135–145)

## 2021-11-19 LAB — URINALYSIS, ROUTINE W REFLEX MICROSCOPIC
Bilirubin Urine: NEGATIVE
Glucose, UA: NEGATIVE mg/dL
Hgb urine dipstick: NEGATIVE
Ketones, ur: NEGATIVE mg/dL
Leukocytes,Ua: NEGATIVE
Nitrite: NEGATIVE
Protein, ur: NEGATIVE mg/dL
Specific Gravity, Urine: 1.01 (ref 1.005–1.030)
pH: 7 (ref 5.0–8.0)

## 2021-11-19 LAB — CBC WITH DIFFERENTIAL/PLATELET
Abs Immature Granulocytes: 0.02 10*3/uL (ref 0.00–0.07)
Basophils Absolute: 0 10*3/uL (ref 0.0–0.1)
Basophils Relative: 1 %
Eosinophils Absolute: 0.3 10*3/uL (ref 0.0–0.5)
Eosinophils Relative: 6 %
HCT: 32.5 % — ABNORMAL LOW (ref 39.0–52.0)
Hemoglobin: 10.9 g/dL — ABNORMAL LOW (ref 13.0–17.0)
Immature Granulocytes: 0 %
Lymphocytes Relative: 29 %
Lymphs Abs: 1.8 10*3/uL (ref 0.7–4.0)
MCH: 31.5 pg (ref 26.0–34.0)
MCHC: 33.5 g/dL (ref 30.0–36.0)
MCV: 93.9 fL (ref 80.0–100.0)
Monocytes Absolute: 0.4 10*3/uL (ref 0.1–1.0)
Monocytes Relative: 7 %
Neutro Abs: 3.6 10*3/uL (ref 1.7–7.7)
Neutrophils Relative %: 57 %
Platelets: 194 10*3/uL (ref 150–400)
RBC: 3.46 MIL/uL — ABNORMAL LOW (ref 4.22–5.81)
RDW: 13.6 % (ref 11.5–15.5)
WBC: 6.2 10*3/uL (ref 4.0–10.5)
nRBC: 0 % (ref 0.0–0.2)

## 2021-11-19 MED ORDER — IOHEXOL 300 MG/ML  SOLN
100.0000 mL | Freq: Once | INTRAMUSCULAR | Status: AC | PRN
  Administered 2021-11-19: 100 mL via INTRAVENOUS

## 2021-11-19 NOTE — ED Provider Notes (Signed)
Care assumed from [facility], PA-C at shift change. Please see their note for further information.  Briefly: Patient presents today with complaints of lower abdominal pain for 20 days. Tenderness noted along the mons pubis area. Labs unremarkable  Ddx: hernia, diverticulitis, cellulitis, acute cystitis  Plan: CT pending at shift change. If unchanged from previous, patient will be stable for discharge.  CT scan resulted which reveals  1. Enlarged prostate measuring up to 5.3 cm in transverse dimension which protrudes into the base of the bladder.   2. Small bilateral fat containing inguinal hernias.   3. Bowel loops are normal in caliber. Normal appendix. Retained colonic stool suggesting constipation.   4. Moderate atherosclerotic disease of abdominal aorta and branch vessels.   5. No CT evidence of acute abdominal/pelvic process or significant interval change since prior examination.  I have personally reviewed and interpreted this imaging and agree with radiology interpretation.  Upon reassessment, patient states his pain has significantly improved. No further emergent concerns at this time. He is stable for discharge. Educated on red flag symptoms that would prompt immediate return. Discharged in stable condition.   Vear Clock 11/19/21 1939    Milagros Loll, MD 11/19/21 2038

## 2021-11-19 NOTE — ED Provider Notes (Signed)
[facility] EMERGENCY DEPARTMENT Provider Note   CSN: [id number] Arrival date & time: 11/19/21  1542     History  Chief Complaint  Patient presents with   Abdominal Pain    Matthew [name] is a 66 y.o. male who presents to the emergency department complaining of pain to the lower abdomen onset 20 days.  Notes that he was admitted to the hospital on 7/2 where he was worked up for hernias and they found that he had an enlarged prostate.  He was told at discharge that he was to follow-up with urology.  He saw his primary care provider who just put in a referral to urology with this appointment being on 12/08/2021.  Patient has urinary hesitancy.  No meds tried prior to arrival.  Was not given any medications for his enlarged prostate.  Denies fever, abdominal pain, nausea, vomiting, dysuria, hematuria, testicular pain/swelling.  The history is provided by the patient and the spouse. No language interpreter was used.       Home Medications Prior to Admission medications   Medication Sig Start Date End Date Taking? Authorizing Provider  amLODipine (NORVASC) 10 MG tablet Take 1 tablet (10 mg total) by mouth daily. 11/05/21   Leroy Sea, MD  Calcium Carbonate Antacid (TUMS PO) Take 1 tablet by mouth daily as needed (heartburn).    [provider]  carvedilol (COREG) 3.125 MG tablet Take 1 tablet (3.125 mg total) by mouth 2 (two) times daily with a meal. 11/04/21   Leroy Sea, MD  Cholecalciferol (VITAMIN D3) 250 MCG (10000 UT) capsule Take 10,000 Units by mouth once a week.    [provider]  clonazePAM (KLONOPIN) 1 MG tablet Take 1 tablet (1 mg total) by mouth daily as needed (for severe anxiety or agitation). 11/04/21   Leroy Sea, MD  lamoTRIgine (LAMICTAL) 150 MG tablet Take 1 tablet (150 mg total) by mouth 2 (two) times daily for 2 days. 11/04/21 11/06/21  Leroy Sea, MD  lamoTRIgine (LAMICTAL) 200 MG tablet Take 1 tablet (200 mg total) by  mouth 2 (two) times daily. 11/07/21   Leroy Sea, MD  lithium carbonate 300 MG capsule Take 1 capsule (300 mg total) by mouth at bedtime. Must get lithium level checked by your PCP and psychiatrist within a week and then get future doses prescribed. 11/04/21   Leroy Sea, MD  zaleplon (SONATA) 10 MG capsule Take 1 capsule (10 mg total) by mouth at bedtime as needed for sleep. 11/04/21   Leroy Sea, MD      Allergies    Sumatriptan and Tramadol    Review of Systems   Review of Systems  Constitutional:  Negative for fever.  Gastrointestinal:  Negative for abdominal pain, nausea and vomiting.  Genitourinary:  Negative for dysuria, hematuria, scrotal swelling and testicular pain.       +urinary hesitancy  Skin:  Negative for color change and wound.  All other systems reviewed and are negative.   Physical Exam Updated Vital Signs BP (!) 151/99   Pulse (!) 56   Temp 98.3 F (36.8 C) (Oral)   Resp 20   Ht 5\' 10"  (1.778 m)   Wt 63.5 kg   SpO2 100%   BMI 20.09 kg/m  Physical Exam Vitals and nursing note reviewed. Exam conducted with a chaperone present.  Constitutional:      General: He is not in acute distress.    Appearance: Normal appearance. He is  not ill-appearing or diaphoretic.  HENT:     Head: Normocephalic and atraumatic.     Right Ear: External ear normal.     Left Ear: External ear normal.     Mouth/Throat:     Pharynx: No oropharyngeal exudate.  Eyes:     General: No scleral icterus.    Conjunctiva/sclera: Conjunctivae normal.  Cardiovascular:     Rate and Rhythm: Normal rate and regular rhythm.     Pulses: Normal pulses.     Heart sounds: Normal heart sounds.  Pulmonary:     Effort: Pulmonary effort is normal. No respiratory distress.     Breath sounds: Normal breath sounds. No wheezing.  Abdominal:     General: Abdomen is flat. Bowel sounds are normal. There is no distension.     Palpations: Abdomen is soft. There is no mass.     Tenderness:  There is no abdominal tenderness. There is no guarding or rebound.     Hernia: There is no hernia in the left inguinal area or right inguinal area.  Genitourinary:    Penis: Normal.      Testes: Normal. Cremasteric reflex is present.        Right: Mass, tenderness or swelling not present. Right testis is descended.        Left: Mass, tenderness or swelling not present. Left testis is descended.     Epididymis:     Right: Normal.     Left: Normal.     Comments: RN chaperone present for exam.  Tenderness to palpation noted to the mons pubis without overlying skin changes.  Mild swelling noted to the area.  No inguinal adenopathy appreciated bilaterally.  No tenderness to palpation noted to bilateral testes.  No swelling noted to bilateral testes.  No erythema noted. Musculoskeletal:        General: Normal range of motion.     Cervical back: Normal range of motion and neck supple.  Lymphadenopathy:     Lower Body: No right inguinal adenopathy. No left inguinal adenopathy.  Skin:    General: Skin is warm and dry.     Findings: No rash.  Neurological:     Mental Status: He is alert.  Psychiatric:        Behavior: Behavior normal.     ED Results / Procedures / Treatments   Labs (all labs ordered are listed, but only abnormal results are displayed) Labs Reviewed  CBC WITH DIFFERENTIAL/PLATELET - Abnormal; Notable for the following components:      Result Value   RBC 3.46 (*)    Hemoglobin 10.9 (*)    HCT 32.5 (*)    All other components within normal limits  BASIC METABOLIC PANEL - Abnormal; Notable for the following components:   Calcium 8.8 (*)    Anion gap 3 (*)    All other components within normal limits  URINALYSIS, ROUTINE W REFLEX MICROSCOPIC    EKG None  Radiology No results found.  Procedures Procedures    Medications Ordered in ED Medications - No data to display  ED Course/ Medical Decision Making/ A&P Clinical Course as of 11/19/21 1836  Sat Nov 19, 2021  1823 Discussed with patient and wife regarding labs and treatment plan.  Answered all available questions.  Patient notes that he does not need any medications for pain at this time. [SB]    Clinical Course User Index [SB] Shterna Laramee A, PA-C  Medical Decision Making Amount and/or Complexity of Data Reviewed Labs: ordered. Radiology: ordered.  Risk Prescription drug management.   Pt presents with lower abdominal pain onset 20 days.  Vital signs stable, patient afebrile.  RN chaperone present for exam.  On exam patient with tenderness to palpation noted to mons pubis without overlying skin changes.  Mild swelling noted to the area.  No inguinal adenopathy appreciated bilaterally.  No tenderness to palpation noted to bilateral testes.  No swelling noted to bilateral testes.  No erythema noted.  No acute cardiovascular or respiratory exam findings.  Differential diagnosis includes hernia, diverticulitis, cellulitis, acute cystitis.    Additional history obtained:  Additional history obtained from Spouse/Significant Other External records from outside source obtained and reviewed including: Patient was admitted to the hospital on 10/30/2021 due to AKI.  CT abdomen pelvis at that time noted of moderate to marked severity prostate gland enlargement however no other intra-abdominal findings.  Labs:  I ordered, and personally interpreted labs.  The pertinent results include:   Urinalysis unremarkable. BMP unremarkable. CBC without leukocytosis, hemoglobin at 10.9 otherwise unremarkable  Imaging: I ordered imaging studies including CT abdomen pelvis ordered and results pending at time of signout.   Patient case discussed with Lurena Nida, PA-C at sign-out. Plan at sign-out is pending CT scan, likely Discharge home, however, plans may change as per oncoming team. Patient care transferred at sign out.    This chart was dictated using voice recognition software,  Dragon. Despite the best efforts of this provider to proofread and correct errors, errors may still occur which can change documentation meaning.   Final Clinical Impression(s) / ED Diagnoses Final diagnoses:  Lower abdominal pain    Rx / DC Orders ED Discharge Orders     None         Haasini Patnaude A, PA-C 11/19/21 1841    Milagros Loll, MD 11/19/21 2037

## 2021-11-19 NOTE — Discharge Instructions (Signed)
Labs and CT imaging did not show any emergent concerns today. I recommend you follow-up with your PCP in the next few days for continued evaluation and management and call your urologist to try and schedule an earlier appointment.  Return if development of any new or worsening symptoms

## 2021-11-19 NOTE — ED Triage Notes (Signed)
Pt reports lower abd pain intermittently since 7/2; sts he was worked up for hernias, but they found prostate was enlarged instead; sts pain increased significantly in the last hour

## 2021-11-21 ENCOUNTER — Ambulatory Visit (HOSPITAL_COMMUNITY): Payer: Medicare Other | Admitting: Student in an Organized Health Care Education/Training Program

## 2021-11-21 ENCOUNTER — Ambulatory Visit: Payer: Medicare Other | Admitting: Adult Health

## 2021-11-21 ENCOUNTER — Encounter: Payer: Self-pay | Admitting: Adult Health

## 2021-11-21 VITALS — BP 138/82 | HR 50 | Ht 70.0 in | Wt 144.2 lb

## 2021-11-21 DIAGNOSIS — Z5181 Encounter for therapeutic drug level monitoring: Secondary | ICD-10-CM | POA: Diagnosis not present

## 2021-11-21 DIAGNOSIS — G40009 Localization-related (focal) (partial) idiopathic epilepsy and epileptic syndromes with seizures of localized onset, not intractable, without status epilepticus: Secondary | ICD-10-CM | POA: Diagnosis not present

## 2021-11-21 MED ORDER — LAMOTRIGINE 200 MG PO TABS: 200.0000 mg | ORAL_TABLET | Freq: Two times a day (BID) | ORAL | 11 refills | Status: DC

## 2021-11-21 NOTE — Patient Instructions (Addendum)
Continue lamotrigine 200mg  twice daily for seizure prevention - please do not change dosage of this medication unless discussed with prior   We will check lab work today as well as repeat an EEG    Follow up in 4 months or call earlier if needed     Thank you for coming to see [place] at [facility]. I hope we have been able to provide you high quality care today.  You may receive a patient satisfaction survey over the next few weeks. We would appreciate your feedback and comments so that we may continue to improve ourselves and the health of our patients.

## 2021-11-21 NOTE — Progress Notes (Signed)
[facility] [address] [place]. [place] [address] [phone]       OFFICE FLLOW UP VISIT NOTE  Matthew [name] Date of Birth:  [birth year] Medical Record Number:  [id number]   Referring MD: Matthew [name]  Reason for Referral: Seizure  Chief Complaint  Patient presents with   Follow-up    Pt is still feeling weak. Room 3 with wife     HPI:   Initial visit 07/08/2020 Matthew [name]: Matthew [name] is a 66 year old Caucasian male seen today for initial office consultation visit for seizure.  History is obtained from the patient and review of electronic medical records and I have personally reviewed pertinent imaging films in PACS.  He has a past medical history of anxiety and bipolar disorder, migraines, hypertension, panic attacks and substance abuse.  He was seen in the ER on 05/04/2020 when he developed an episode in which he stated he his eyes deviated tonically to the left and it was uncomfortable he tried to look back across and walk and stumbled and fell and had a severe concussion with frontal and periorbital swelling with brief loss of consciousness.  Some confusion after that but gradually improved by the time he came to the ER.  On inquiry he and his wife admitted he had had for the last few well several episodes in which he felt subjectively his eyes were looking in different directions and he could not control them.  During these episodes her wife noticed that he would stare into the distance and make abnormal breathing type sound.  Episodes of fairly stereotypical occurring several times a year.  He would occasionally rapidly blink during these episodes.  Patient has a history of bipolar disorder and was on lamotrigine and lithium and had apparently stopped this medication a month prior to the last episode in January.  Is lithium and lamotrigine levels were undetectable on 05/05/2020.  EEG was normal.  MRI scan of the brain showed no acute brain abnormality.  There  are mild changes of chronic small vessel disease.  Incidental 1.2 cm midline posterior nasopharynx cyst was noted.  Right frontoparietal and periorbital contusion was also noted.  Patient also has history of migraine headaches and he takes Topamax 200 mg daily for prophylaxis it seems to have worked quite well and migraines in the down to once every couple of months.  He was recently seen by psychiatrist to seem quite content with his current medication for bipolar and did not suggest any changes.  Patient has no new complaints today.  He has no prior history of seizures, strokes, TIAs, significant head injury with loss of consciousness except the most recent one.  No childhood history of febrile seizures family history of epilepsy. Update 10/28/2020 Matthew [name]; He returns for follow-up after last visit 3 months ago.  Is accompanied by his wife.  He is doing well and has not had any generalized seizures or staring episodes.  He remains on Lamictal 200 mg daily she is tolerating well without side effects.  He is also on Topamax 200 mg daily and his migraines are quite well controlled and occur only once every couple of months.  He is a decreased appetite and has been losing weight.  Patient continues to have mild short-term memory difficulties but these are not progressive.  He states his bipolar symptoms are quite stable and he has regular follow-up with his psychiatrist.  He had follow-up EEG on 08/05/2020 which was also normal.  He has  no new complaints today.   Update 02/10/2021 Matthew [name]: Returns for 54-month seizure follow-up.  Overall stable without any reoccurring seizure events.  Remains on lamotrigine (prior 200mg  daily but per pt, was changed to either 100mg  BID or 150mg  BID by Dr. - unable to verify this via epic).  No side effects.  Remains on topiramate 100 mg daily for migraine prophylaxis. No migraine occurrence since decreasing dosage from 200 mg to 100 mg and does report excellent improvement of his  memory - still not "perfect" but greatly improved.  Routinely followed by psychiatry and PCP.  No concerns today.    Update 07/13/2021 Matthew [name]: Patient being seen for recent hospital follow-up accompanied by his wife.  He presented to ED on 07/05/2021 with confusion and garbled speech and some aphasia and then separate episode shortly after consisting of staring off, not responding, and stumbled in hallway at home.  He received TNK due to stroke concern, MRI and CTA unremarkable.  EEG showed epileptogenicity and cortical dysfunction arising from left frontotemporal region.  Episode initially concerning for stroke but after work-up, more concerning for seizure therefore increased lamotrigine to 200 mg twice daily.  Topiramate discontinued.  He was discharged home on 3/9.  Since discharge, he has been doing well. Does have some fatigue especially after increased exertion.  He has had no additional seizure activity and tolerating higher dose of lamotrigine without side effects. Wife mentions frequent panic attacks, closely followed by psychiatry. He will use clonazepam occasionally for panic attacks but does report increased fatigue after taking.  He has not had any migraines or headaches since discontinuing topiramate.   EEG 07/06/2021- epileptogenicity and cortical dysfunction arising from left frontotemporal region. Additionally there is mild diffuse encephalopathy, nonspecific etiology. No seizures were seen throughout the recording.    Update 11/21/2021 Matthew [name]: Patient returns for 54-month seizure follow-up accompanied by his wife  Evaluated in ED 7/2 with 1 to 2-week progression of confusion, falling and weakness likely in setting of toxic metabolic encephalopathy due to polypharmacy and lithium OD. Per note review, psychiatrist made adjustments to medication regimen with decreasing lamotrigine dosage and adding Depakote and Seroquel in addition to lithium and as needed Sonata and Klonopin.  There was concern of  unintentional overdosing with these multiple medications.  CT head no acute findings.  Medication adjustments made.  Depakote and Seroquel discontinued as well as topiramate (although this had been previously d/c'd).  Lithium levels normalized and mental status gradually returned to baseline.  Evaluated by psychiatry and neurology.  Discharged on lamotrigine 200 mg twice daily (prior dosage for seizure prevention). Advised outpatient neuro f/u as well as PCP/psychiatry follow-up.   He has been stable since discharge but still feels a generalized weakness. Denies any recent seizure activity.  Currently taking lamotrigine 200 mg twice daily without side effects.   He has not had any recent migraine headaches, he is not currently on any prophylactic medications  Previously being followed by a psychiatrist in 11/23/2021 with in person visits 3-4 times per year otherwise completed via virtual visits. Due to recent event, they are now in the process of changing psychiatrist and is scheduled to see Dr. 6-month on 7/31.  Wife has concerns regarding medications that were prescribed by psychiatry as well as by this office as there was duplicate medications (psychiatry was also prescribing lamotrigine).   Of note, wife is also concerned regarding reported history of substance abuse on patient's chart most notably use of cocaine.  He did have positive  UDS for cocaine metabolites back in 2009 and 2011 but he denies any active use during that time.  Recent UDS negative for cocaine use.  He does admit to occasional THC use but no other drug use. His recent UDS was positive for opiates and per wife, she gave him a half tab of oxycodone (which was from an old prescription 2 years ago) due to severe lower abdominal pain which was eventually found to have enlarged prostate, he is scheduled to see urology next month.  He does not routinely take any type of opiates.  No further concerns at this time      ROS:   14  system review of systems is positive for those listed in HPI and all other systems negative  PMH:  Past Medical History:  Diagnosis Date   Anxiety    on meds   AVM (arteriovenous malformation) of colon 02/04/2021   Bipolar I disorder, most recent episode (or current) mixed, moderate    GERD (gastroesophageal reflux disease)    Hypertension    on meds   Migraines    OSA (obstructive sleep apnea)    retested-bipap not needed   Panic attacks    Seizures (HCC)    last known 05/2020;   Substance abuse (HCC)     Social History:  Social History   Socioeconomic History   Marital status: Married    Spouse name: susan   Number of children: Not on file   Years of education: Not on file   Highest education level: Not on file  Occupational History   Occupation: Disability    Comment: Bipolar disorder  Tobacco Use   Smoking status: Former    Types: Pipe, Cigars   Smokeless tobacco: Never  Vaping Use   Vaping Use: Never used  Substance and Sexual Activity   Alcohol use: No   Drug use: No    Types: Cocaine    Comment: past history of use-   Sexual activity: Not on file  Other Topics Concern   Not on file  Social History Narrative   Lives with wife   Right Handed   Drinks 3-4 cups caffeine daily      Social Determinants of Health   Financial Resource Strain: Not on file  Food Insecurity: Not on file  Transportation Needs: Not on file  Physical Activity: Not on file  Stress: Not on file  Social Connections: Not on file  Intimate Partner Violence: Not on file    Medications:   Current Outpatient Medications on File Prior to Visit  Medication Sig Dispense Refill   amLODipine (NORVASC) 10 MG tablet Take 1 tablet (10 mg total) by mouth daily. 30 tablet 0   Calcium Carbonate Antacid (TUMS PO) Take 1 tablet by mouth daily as needed (heartburn).     carvedilol (COREG) 3.125 MG tablet Take 1 tablet (3.125 mg total) by mouth 2 (two) times daily with a meal. 60 tablet 0    Cholecalciferol (VITAMIN D3) 250 MCG (10000 UT) capsule Take 10,000 Units by mouth once a week.     clonazePAM (KLONOPIN) 1 MG tablet Take 1 tablet (1 mg total) by mouth daily as needed (for severe anxiety or agitation). 5 tablet 0   lamoTRIgine (LAMICTAL) 200 MG tablet Take 1 tablet (200 mg total) by mouth 2 (two) times daily. 60 tablet 0   lithium carbonate 300 MG capsule Take 1 capsule (300 mg total) by mouth at bedtime. Must get lithium level checked by your PCP  and psychiatrist within a week and then get future doses prescribed. 20 capsule 0   zaleplon (SONATA) 10 MG capsule Take 1 capsule (10 mg total) by mouth at bedtime as needed for sleep. 10 capsule 0   lamoTRIgine (LAMICTAL) 150 MG tablet Take 1 tablet (150 mg total) by mouth 2 (two) times daily for 2 days. 4 tablet 0   No current facility-administered medications on file prior to visit.    Allergies:   Allergies  Allergen Reactions   Sumatriptan Other (See Comments)     dizziness   Tramadol     Severe dizziness, restless leg, jittery    Physical Exam Today's Vitals   11/21/21 1034  BP: 138/82  Pulse: (!) 50  Weight: 144 lb 4 oz (65.4 kg)  Height: 5\' 10"  (1.778 m)   Body mass index is 20.7 kg/m.    General: well developed, well nourished very pleasant middle-aged Caucasian male, seated, in no evident distress Head: head normocephalic and atraumatic.   Neck: supple with no carotid or supraclavicular bruits Cardiovascular: regular rate and rhythm, no murmurs Musculoskeletal: no deformity Skin:  no rash/petichiae Vascular:  Normal pulses all extremities  Neurologic Exam Mental Status: Awake and fully alert.  Fluent speech and language.  Oriented to place and time. Recent and remote memory intact. Attention span, concentration and fund of knowledge appropriate. Mood and affect appropriate.  Cranial Nerves: Pupils equal, briskly reactive to light. Extraocular movements full without nystagmus. Visual fields full to  confrontation. Hearing intact. Facial sensation intact. Face, tongue, palate moves normally and symmetrically.  Motor: Normal bulk and tone. Normal strength in all tested extremity muscles. Sensory.: intact to touch , pinprick , position and vibratory sensation.  Coordination: Rapid alternating movements normal in all extremities. Finger-to-nose and heel-to-shin performed accurately bilaterally. Gait and Station: Arises from chair without difficulty. Stance is normal. Gait demonstrates normal stride length and balance without use of assistive device. Able to heel, toe and tandem walk with mild difficulty.  Reflexes: 1+ and symmetric. Toes downgoing.       ASSESSMENT: 66 year old Caucasian male with recurrent stereotypical episodes likely complex partial seizures.  In January 2022 he had severe tonic deviation of his eyes to the left, fell and developed a contusion with brief loss of consciousness.  He had been on lamotrigine and lithium for bipolar disorder which he had discontinued a month prior which may have triggered a more severe episode.  Recurrent seizure mimicking stroke 07/05/2021 (see HPI).  Recent hospitalization 7/2 for toxic encephalopathy most likely from polypharmacy and lithium OD (unintentionally)    PLAN:  -Continue lamotrigine 200 mg twice daily-refill provided. Advised to not change this medication dosage unless discussed with our office prior -Lamotrigine level 5.5 (10/31/2021) - repeat today -EEG 06/2021 epileptogenicity and cortical dysfunction arising from left frontal temporal region -has not yet had repeat EEG, will place new order today -Migraines stable, no indication for prophylactic therapy -Scheduled to establish care with new psychiatrist next week which was highly encouraged -No driving for 6 months post seizure per [place] law - avoid seizure provoking stimuli like medication noncompliance, sleep deprivation and extremes of activity.   -Advised to call office with any  seizure activity -Encouraged wife to contact medical records to discuss medical records reporting substance abuse (see HPI)    Follow-up in 4 months or call earlier if needed    CC:  Soundra PilonBrake, Andrew R, FNP   I spent 38 minutes of face-to-face and non-face-to-face time with patient and  wife.  This included previsit chart review, lab review, study review, order entry, electronic health record documentation, patient education and discussion regarding history of seizures and recent hospitalization and ongoing use of seizure prophylactic medication and indication for EEG and answered all other questions to patient and wife's satisfaction  Ihor Austin, AGNP-BC  [facility] [address] [place], [place] [address]  Phone [phone] Fax [phone] Note: This document was prepared with digital dictation and possible smart phrase technology. Any transcriptional errors that result from this process are unintentional.

## 2021-11-22 ENCOUNTER — Encounter: Payer: Self-pay | Admitting: Adult Health

## 2021-11-22 LAB — LAMOTRIGINE LEVEL: Lamotrigine Lvl: 9.1 ug/mL (ref 2.0–20.0)

## 2021-11-22 NOTE — Telephone Encounter (Signed)
Discussed evidence of positive cocaine metabolites on UDS in 2009 and 2011 during visit yesterday. If she is unable to view via MyChart, she should be able to obtain this through medical records.

## 2021-11-28 ENCOUNTER — Ambulatory Visit (HOSPITAL_COMMUNITY): Payer: Medicare Other | Admitting: Student in an Organized Health Care Education/Training Program

## 2021-11-28 DIAGNOSIS — G40009 Localization-related (focal) (partial) idiopathic epilepsy and epileptic syndromes with seizures of localized onset, not intractable, without status epilepticus: Secondary | ICD-10-CM | POA: Diagnosis not present

## 2021-11-28 DIAGNOSIS — F319 Bipolar disorder, unspecified: Secondary | ICD-10-CM | POA: Diagnosis not present

## 2021-11-28 MED ORDER — LITHIUM CARBONATE 300 MG PO CAPS: 300.0000 mg | ORAL_CAPSULE | Freq: Every day | ORAL | 1 refills | Status: DC

## 2021-11-28 NOTE — Telephone Encounter (Signed)
Reviewed spreadsheet provided.  Thank you.

## 2021-11-28 NOTE — Progress Notes (Signed)
Psychiatric Initial Adult Assessment   Patient Identification: Matthew [name] MRN:  [id number] Date of Evaluation:  11/28/2021 Referral Source: Follow up from recent Hospitalization. Chief Complaint:  No chief complaint on file.  Visit Diagnosis: No diagnosis found.  History of Present Illness:   Matthew [name] is a 66 yr old male who presents to establish care and for medication management after recent hospitalization due to Lithium Toxicity.  PPHx is significant for Bipolar Disorder, no Suicide Attempts or Self Injurious Behavior, and 1 prior Hospitalization (approx 30 yrs ago).  His wife [name] was present for the entire interview.  They report that patient's first seizure-like activity happened in January 2022.  [name] reports that at that time patient did not come "all the way back."  She reports that he continued to have issues with memory and some weakness.  She reports that in March 2023 they were driving back from [place] when he began acting odd.  She states that they pulled over at a gas station and he began to have issues with speech.  She reports she drove him to the urgent care provider there treated him for a stroke with plans to transfer to [facility] for follow[facility]up care.  At that time neurology discussed with them that it appeared to be a seizure not a stroke.  She reports that patient had been seeing Dr. Merilynn Finlandobertson in [place] for over 30 years since his first hospitalization and that due to this along with medication changes by neurology there was miscommunications about what medications he was taking.  She reports that when she looked at his pill organizer there were multiple medication and some of which she did not think the patient was still on.  After calling his psychiatrist in [place] he was started on Depakote even though he had allergic reaction to Depakote in the past.  She reports that at that time he was on Lamictal, Topamax, Seroquel, and Depakote.  He had 3  days of nausea and vomiting with the Depakote.  She reports she stopped the Depakote and that his vomiting did stop however he began hallucinating and having significant strength and memory issues.  She states she brought him to the hospital and that is when he was discovered he had significant dehydration and lithium toxicity.  During the hospitalization his Topamax was stopped as well as his Seroquel.  They report his lithium was significantly reduced but continued after discussion with themselves, consulting psychiatry, consulting neurology, and hospitalist.  They report that they needed to establish with someone local for continued care.  Reports past psychiatric history significant for 1 manic episode and a diagnosis of bipolar disorder.  He reports 1 hospitalization due to this manic episode over 30 years ago.  He reports no history of suicide attempts.  He reports no history of self-injurious behavior.  Reports past medical history significant for hypertension and migraines.  Reports past [facility] history significant [facility] both [facility] and dental implants.  Does report an allergy to Depakote of nausea and vomiting.  Does report a history of seizures currently being followed by neurology.  Reports no history of head trauma.  He currently lives at home with his wife.  He is a retired [employer] manager.  He reports no tobacco use.  He reports no alcohol use.  He does report smoking THC occasionally stating 1 g will last him a few days.  He reports no legal issues.  He reports no access to firearms.  After long discussion and shared decision making we decided that we will continue with lithium for now.  Discussed that since his symptoms of toxicity have continued to improve with his diminished dose and his stability on it for over 30 years it was reasonable to continue at this lower dose but discussed there is an increased risk of mood instability.  Discussed we would continue with Lamictal at  current dosing which is prescribed by neurology.  Discussed returning to the office in 4 weeks to reassess continuing on the lithium or stopping at that time.  Discussed with them that if patient goes more than 24 to 36 hours without fluid intake again while still being on the lithium he needs to be evaluated and possibly given IV fluids to ensure no dehydration/lithium toxicity happens again.  They are both in agreement with this.  He reports no SI, HI, or AVH.  Discussed with them what to do in the event of a future crisis.  Discussed that they can return to the office, they can go to [facility], go to [facility], go to the nearest ED, or call 911 or [phone].   They reported understanding and had no concerns.  He does report some urinary symptoms that started around the time of his lithium toxicity when it was discovered he had an enlarged prostate.  He does have an appointment with urology in a few weeks.  He reports no other concerns at present.   Associated Signs/Symptoms: Depression Symptoms:   Reports None (Hypo) Manic Symptoms:   Reports None Anxiety Symptoms:   Reports None Psychotic Symptoms:   Reports None PTSD Symptoms: NA  Past Psychiatric History: Bipolar Disorder, no Suicide Attempts or Self Injurious Behavior, and 1 prior Hospitalization (approx 30 yrs ago).  Previous Psychotropic Medications: Yes  Lamictal, Topamax, Seroquel, Depakote.  Substance Abuse History in the last 12 months:  No.  Consequences of Substance Abuse: NA  Past Medical History:  Past Medical History:  Diagnosis Date   Anxiety    on meds   AVM (arteriovenous malformation) of colon 02/04/2021   Bipolar I disorder, most recent episode (or current) mixed, moderate    GERD (gastroesophageal reflux disease)    Hypertension    on meds   Migraines    OSA (obstructive sleep apnea)    retested-bipap not needed   Panic attacks    Seizures (HCC)    last known 05/2020;   Substance abuse (HCC)     Past Surgical History:   Procedure Laterality Date   CARPOMETACARPEL ([facility]) FUSION OF THUMB WITH AUTOGRAFT FROM RADIUS Bilateral    COLONOSCOPY  2011   CG[facility]in pt at [facility]-   FINGER ARTHROPLASTY Left 06/17/2012   Procedure: FINGER ARTHROPLASTY;  Surgeon: Jodi Marble, MD;  Location: [facility];  Service: Orthopedics;  Laterality: Left;  left thumb suspension arthroplasty    HEMORRHOID SURGERY  2000   Dr Earlene Plater   TONSILLECTOMY     WISDOM TOOTH EXTRACTION      Family Psychiatric History: Mother- Depression Niece- Depression/Bipolar, 1 Suicide Attempt Cousin- Substance Abuse ([place])  Family History:  Family History  Problem Relation Age of Onset   Depression Mother    Hypertension Father    Cancer Father 85       brain tumor   Heart disease Paternal Grandmother    Arthritis Other    Hypertension Other    Colon polyps Neg Hx    Colon cancer Neg Hx    Esophageal cancer  Neg Hx    Rectal cancer Neg Hx    Stomach cancer Neg Hx     Social History:   Social History   Socioeconomic History   Marital status: Married    Spouse name: susan   Number of children: Not on file   Years of education: Not on file   Highest education level: Not on file  Occupational History   Occupation: Disability    Comment: Bipolar disorder  Tobacco Use   Smoking status: Former    Types: Pipe, Cigars   Smokeless tobacco: Never  Vaping Use   Vaping Use: Never used  Substance and Sexual Activity   Alcohol use: No   Drug use: No    Types: Cocaine    Comment: past history of use-   Sexual activity: Not on file  Other Topics Concern   Not on file  Social History Narrative   Lives with wife   Right Handed   Drinks 3-4 cups caffeine daily      Social Determinants of Health   Financial Resource Strain: Not on file  Food Insecurity: Not on file  Transportation Needs: Not on file  Physical Activity: Not on file  Stress: Not on file  Social Connections: Not on file    Additional Social History:  None  Allergies:   Allergies  Allergen Reactions   Depakote [Divalproex Sodium] Nausea And Vomiting   Seroquel [Quetiapine Fumarate] Nausea And Vomiting   Sumatriptan Other (See Comments)     dizziness   Tramadol     Severe dizziness, restless leg, jittery    Metabolic Disorder Labs: Lab Results  Component Value Date   HGBA1C 4.6 (L) 07/07/2021   MPG 85.32 07/07/2021   No results found for: "PROLACTIN" Lab Results  Component Value Date   CHOL 122 07/07/2021   TRIG 67 07/07/2021   HDL 34 (L) 07/07/2021   CHOLHDL 3.6 07/07/2021   VLDL 13 07/07/2021   LDLCALC 75 07/07/2021   LDLCALC 90 10/24/2010   Lab Results  Component Value Date   TSH 1.088 10/31/2021    Therapeutic Level Labs: Lab Results  Component Value Date   LITHIUM 0.33 (L) 11/04/2021   No results found for: "CBMZ" Lab Results  Component Value Date   VALPROATE 42 (L) 10/31/2021    Current Medications: Current Outpatient Medications  Medication Sig Dispense Refill   amLODipine (NORVASC) 10 MG tablet Take 1 tablet (10 mg total) by mouth daily. 30 tablet 0   Calcium Carbonate Antacid (TUMS PO) Take 1 tablet by mouth daily as needed (heartburn).     carvedilol (COREG) 3.125 MG tablet Take 1 tablet (3.125 mg total) by mouth 2 (two) times daily with a meal. 60 tablet 0   Cholecalciferol (VITAMIN D3) 250 MCG (10000 UT) capsule Take 10,000 Units by mouth once a week.     clonazePAM (KLONOPIN) 1 MG tablet Take 1 tablet (1 mg total) by mouth daily as needed (for severe anxiety or agitation). 5 tablet 0   lamoTRIgine (LAMICTAL) 200 MG tablet Take 1 tablet (200 mg total) by mouth 2 (two) times daily. 60 tablet 11   lithium carbonate 300 MG capsule Take 1 capsule (300 mg total) by mouth at bedtime. Must get lithium level checked by your PCP and psychiatrist within a week and then get future doses prescribed. 20 capsule 0   zaleplon (SONATA) 10 MG capsule Take 1 capsule (10 mg total) by mouth at bedtime as needed for  sleep. 10 capsule 0  No current facility-administered medications for this visit.    Musculoskeletal: Strength & Muscle Tone: within normal limits Gait & Station: normal Patient leans: N/A  Psychiatric Specialty Exam: Review of Systems  Respiratory:  Negative for shortness of breath.   Cardiovascular:  Negative for chest pain.  Gastrointestinal:  Negative for diarrhea, nausea and vomiting.  Neurological:  Positive for weakness (improving). Negative for dizziness and headaches.  Psychiatric/Behavioral:  Negative for suicidal ideas.     There were no vitals taken for this visit.There is no height or weight on file to calculate BMI.  General Appearance: Casual and Fairly Groomed  Eye Contact:  Good  Speech:  Clear and Coherent and Normal Rate  Volume:  Normal  Mood:  Euthymic  Affect:  Appropriate and Congruent  Thought Process:  Coherent and Goal Directed  Orientation:  Full (Time, Place, and Person)  Thought Content:  Logical  Suicidal Thoughts:  No  Homicidal Thoughts:  No  Memory:  Immediate;   Good Recent;   Good  Judgement:  Good  Insight:  Good  Psychomotor Activity:  Normal  Concentration:  Concentration: Good and Attention Span: Good  Recall:  Good  Fund of Knowledge:Good  Language: Good  Akathisia:  Negative  Handed:  Right  AIMS (if indicated):  not done  Assets:  Communication Skills Desire for Improvement Housing Resilience Social Support  ADL's:  Intact  Cognition: WNL  Sleep:  Fair   Screenings: CAGE-AID    Flowsheet Row ED to Hosp-Admission (Discharged) from 07/05/2021 in [facility] NEURO/TRAUMA/SURGICAL ICU  CAGE-AID Score 0      Flowsheet Row ED from 11/19/2021 in [facility] ED to Hosp-Admission (Discharged) from 07/05/2021 in [facility] NEURO/TRAUMA/SURGICAL ICU  C-SSRS RISK CATEGORY No Risk No Risk       Assessment and Plan:  Matthew [name] is a 66 yr old male who presents to establish care and for  medication management after recent hospitalization due to Lithium Toxicity.  PPHx is significant for Bipolar Disorder, no Suicide Attempts or Self Injurious Behavior, and 1 prior Hospitalization (approx 30 yrs ago).   Matthew [name] has been doing well since his hospitalization and multiple changes made to his medications.  He has remained stable and has been recovering in his strength.  He continues to remain stable on his Lamictal so we will continue with this dose as it currently is.  After discussion with him and his wife we will continue the lithium but at the same reduced dose as he has had improvement in his symptoms as he has remained stable on it for so long last lithium level checked on 7/7 was 0.33.  We will readdress at the follow-up appointment in 1 month to measure his continued improvement and further discuss with him at that time.  Both him and his wife will monitor fluid intake to ensure he stays properly hydrated.  As he does not use his Klonopin for severe agitation or his Sonata that often for sleep these will not be refilled at this time.  He will return to the office in 4 weeks.   Bipolar Disorder, Full Remission: -Continue Lamictal 200 mg BID for Mood Stability and Seizure Precaution. Prescribed by Neurology. -Continue Lithium 300 mg daily for Mood Stability. -Continue Klonopin 1 mg PRN daily for severe anxiety/agitation.   Insomnia: -Continue Sonata 10 mg PRN QHS     Collaboration of Care:   Patient/Guardian was advised Release of Information must be obtained prior to any  record release in order to collaborate their care with an outside provider. Patient/Guardian was advised if they have not already done so to contact the registration department to sign all necessary forms in order for [place] to release information regarding their care.   Consent: Patient/Guardian gives verbal consent for treatment and assignment of benefits for services provided during this visit. Patient/Guardian  expressed understanding and agreed to proceed.   Lauro Franklin, MD 7/31/20231:32 PM

## 2021-11-29 ENCOUNTER — Other Ambulatory Visit: Payer: Medicare Other | Admitting: *Deleted

## 2021-12-07 ENCOUNTER — Ambulatory Visit: Payer: Medicare Other | Admitting: Neurology

## 2021-12-07 DIAGNOSIS — G40009 Localization-related (focal) (partial) idiopathic epilepsy and epileptic syndromes with seizures of localized onset, not intractable, without status epilepticus: Secondary | ICD-10-CM | POA: Diagnosis not present

## 2021-12-26 ENCOUNTER — Encounter (HOSPITAL_COMMUNITY): Payer: Self-pay | Admitting: Student in an Organized Health Care Education/Training Program

## 2021-12-26 ENCOUNTER — Ambulatory Visit (HOSPITAL_COMMUNITY): Payer: Medicare Other | Admitting: Student in an Organized Health Care Education/Training Program

## 2021-12-26 DIAGNOSIS — F319 Bipolar disorder, unspecified: Secondary | ICD-10-CM | POA: Diagnosis not present

## 2021-12-26 DIAGNOSIS — G40009 Localization-related (focal) (partial) idiopathic epilepsy and epileptic syndromes with seizures of localized onset, not intractable, without status epilepticus: Secondary | ICD-10-CM | POA: Diagnosis not present

## 2021-12-26 MED ORDER — LAMOTRIGINE 200 MG PO TABS: 200.0000 mg | ORAL_TABLET | Freq: Two times a day (BID) | ORAL | 3 refills | Status: DC

## 2021-12-26 NOTE — Addendum Note (Signed)
Addended by: Everlena Cooper on: 12/26/2021 04:50 PM   Modules accepted: Level of Service

## 2021-12-26 NOTE — Progress Notes (Signed)
BH MD/PA/NP OP Progress Note  12/26/2021 3:38 PM Matthew [name]  MRN:  [id number]  Chief Complaint:  Chief Complaint  Patient presents with   Follow-up   HPI:  Matthew [name] is a 66 yr old male who presents for follow up and for medication management.  PPHx is significant for Bipolar Disorder, no Suicide Attempts or Self Injurious Behavior, and 1 prior Hospitalization (approx 30 yrs ago).  He reports that he has continued to improve since his last visit.  He reports that the symptoms of lithium toxicity have completely resolved.  He reports his thinking is clear and his memory is doing well once again.  He reports no weakness or unsteadiness and was not needing a cane to walk around anymore.  He reports that his energy has returned to him and is able to do what would like to do.  He reports that he thinks he is ready to stop the lithium.  Discussed with him that given his continued mood stability with the reduced dose and has continued taking of the Lamictal this was an appropriate choice and agreed with it.  He reports he continues to have groin pain from his hernia.  He reports that he is scheduled for a surgical consult next week and so hopes to have surgery soon.  He reports no other concerns at present.  He reports no SI, HI, or AVH.  He will return for follow-up in approximately 2 months.   Visit Diagnosis:    ICD-10-CM   1. Bipolar I disorder ([place])  F31.9     2. Partial idiopathic epilepsy with seizures of localized onset, not intractable, without status epilepticus ([place])  G40.009 lamoTRIgine (LAMICTAL) 200 MG tablet      Past Psychiatric History: Bipolar Disorder, no Suicide Attempts or Self Injurious Behavior, and 1 prior Hospitalization (approx 30 yrs ago).  Past Medical History:  Past Medical History:  Diagnosis Date   Anxiety    on meds   AVM (arteriovenous malformation) of colon 02/04/2021   Bipolar I disorder, most recent episode (or current) mixed, moderate     GERD (gastroesophageal reflux disease)    Hypertension    on meds   Migraines    OSA (obstructive sleep apnea)    retested-bipap not needed   Panic attacks    Seizures ([place])    last known 05/2020;   Substance abuse ([place])     Past Surgical History:  Procedure Laterality Date   CARPOMETACARPEL ([place]) FUSION OF THUMB WITH AUTOGRAFT FROM RADIUS Bilateral    COLONOSCOPY  2011   CG-in pt at [place] Left 06/17/2012   Procedure: FINGER ARTHROPLASTY;  Surgeon: Jolyn Nap, MD;  Location: [facility];  Service: Orthopedics;  Laterality: Left;  left thumb suspension arthroplasty    HEMORRHOID SURGERY  2000   Dr Rosana Hoes   TONSILLECTOMY     WISDOM TOOTH EXTRACTION      Family Psychiatric History: Mother- Depression Niece- Depression/Bipolar, 1 Suicide Attempt Cousin- Substance Abuse ([place])  Family History:  Family History  Problem Relation Age of Onset   Depression Mother    Hypertension Father    Cancer Father 64       brain tumor   Heart disease Paternal Grandmother    Arthritis Other    Hypertension Other    Colon polyps Neg Hx    Colon cancer Neg Hx    Esophageal cancer Neg Hx    Rectal cancer Neg Hx  Stomach cancer Neg Hx     Social History:  Social History   Socioeconomic History   Marital status: Married    Spouse name: susan   Number of children: Not on file   Years of education: Not on file   Highest education level: Not on file  Occupational History   Occupation: Disability    Comment: Bipolar disorder  Tobacco Use   Smoking status: Former    Types: Pipe, Cigars   Smokeless tobacco: Never  Vaping Use   Vaping Use: Never used  Substance and Sexual Activity   Alcohol use: No   Drug use: No    Types: Cocaine    Comment: past history of use-   Sexual activity: Not on file  Other Topics Concern   Not on file  Social History Narrative   Lives with wife   Right Handed   Drinks 3-4 cups caffeine daily       Social Determinants of Health   Financial Resource Strain: Not on file  Food Insecurity: Not on file  Transportation Needs: Not on file  Physical Activity: Not on file  Stress: Not on file  Social Connections: Not on file    Allergies:  Allergies  Allergen Reactions   Depakote [Divalproex Sodium] Nausea And Vomiting   Seroquel [Quetiapine Fumarate] Nausea And Vomiting   Sumatriptan Other (See Comments)     dizziness   Tramadol     Severe dizziness, restless leg, jittery    Metabolic Disorder Labs: Lab Results  Component Value Date   HGBA1C 4.6 (L) 07/07/2021   MPG 85.32 07/07/2021   No results found for: "PROLACTIN" Lab Results  Component Value Date   CHOL 122 07/07/2021   TRIG 67 07/07/2021   HDL 34 (L) 07/07/2021   CHOLHDL 3.6 07/07/2021   VLDL 13 07/07/2021   LDLCALC 75 07/07/2021   LDLCALC 90 10/24/2010   Lab Results  Component Value Date   TSH 1.088 10/31/2021   TSH 1.207 10/31/2021    Therapeutic Level Labs: Lab Results  Component Value Date   LITHIUM 0.33 (L) 11/04/2021   LITHIUM 0.50 (L) 11/03/2021   Lab Results  Component Value Date   VALPROATE 42 (L) 10/31/2021   VALPROATE 61.0 10/24/2010   No results found for: "CBMZ"  Current Medications: Current Outpatient Medications  Medication Sig Dispense Refill   amLODipine (NORVASC) 10 MG tablet Take 1 tablet (10 mg total) by mouth daily. 30 tablet 0   Calcium Carbonate Antacid (TUMS PO) Take 1 tablet by mouth daily as needed (heartburn).     carvedilol (COREG) 3.125 MG tablet Take 1 tablet (3.125 mg total) by mouth 2 (two) times daily with a meal. 60 tablet 0   Cholecalciferol (VITAMIN D3) 250 MCG (10000 UT) capsule Take 10,000 Units by mouth once a week.     clonazePAM (KLONOPIN) 1 MG tablet Take 1 tablet (1 mg total) by mouth daily as needed (for severe anxiety or agitation). 5 tablet 0   lamoTRIgine (LAMICTAL) 200 MG tablet Take 1 tablet (200 mg total) by mouth 2 (two) times daily. 60 tablet  3   zaleplon (SONATA) 10 MG capsule Take 1 capsule (10 mg total) by mouth at bedtime as needed for sleep. 10 capsule 0   No current facility-administered medications for this visit.     Musculoskeletal: Strength & Muscle Tone: within normal limits Gait & Station: normal Patient leans: N/A  Psychiatric Specialty Exam: Review of Systems  Respiratory:  Negative for cough and  shortness of breath.   Cardiovascular:  Negative for chest pain.  Gastrointestinal:  Negative for abdominal pain, constipation, diarrhea, nausea and vomiting.  Neurological:  Negative for weakness and headaches.  Psychiatric/Behavioral:  Negative for agitation, confusion, dysphoric mood, hallucinations, self-injury, sleep disturbance and suicidal ideas. The patient is not nervous/anxious and is not hyperactive.     There were no vitals taken for this visit.There is no height or weight on file to calculate BMI.  General Appearance: Casual and Fairly Groomed  Eye Contact:  Good  Speech:  Clear and Coherent and Normal Rate  Volume:  Normal  Mood:   "good"  Affect:  Appropriate and Congruent  Thought Process:  Coherent and Goal Directed  Orientation:  Full (Time, Place, and Person)  Thought Content: Logical   Suicidal Thoughts:  No  Homicidal Thoughts:  No  Memory:  Immediate;   Good Recent;   Good  Judgement:  Good  Insight:  Good  Psychomotor Activity:  Normal  Concentration:  Concentration: Good and Attention Span: Good  Recall:  Good  Fund of Knowledge: Good  Language: Good  Akathisia:  Negative  Handed:  Right  AIMS (if indicated): not done  Assets:  Communication Skills Desire for Improvement Financial Resources/Insurance Housing Resilience Social Support  ADL's:  Intact  Cognition: WNL  Sleep:  Good   Screenings: CAGE-AID    Flowsheet Row ED to Hosp-Admission (Discharged) from 07/05/2021 in [facility] NEUR[facility]/TRAUMA/SURGICAL ICU  CAGE-AID Score 0      Flowsheet Row ED from 11/19/2021 in  [facility] to Hosp-Admission (Discharged) from 07/05/2021 in [facility] NEURO/TRAUMA/SURGICAL ICU  C-SSRS RISK CATEGORY No Risk No Risk        Assessment and Plan:  Matthew [name] is a 66 yr old male who presents for follow up and for medication management.  PPHx is significant for Bipolar Disorder, no Suicide Attempts or Self Injurious Behavior, and 1 prior Hospitalization (approx 30 yrs ago).   "Jorja Loa" is doing much better since his last appointment.  Per his report all of the symptoms of the lithium toxicity have ceased.  He has had no change in his mood stability with the decrease in his lithium so we will stop this today.  We will continue with his Lamictal for mood stability.  We will not make any other medication changes at this time.  He will return for follow-up in approximately 2 months.   Bipolar Disorder, Full Remission: -Continue Lamictal 200 mg BID for Mood Stability and Seizure Precaution.  Prescribed by Neurology. -Stop Lithium -Continue Klonopin 1 mg PRN daily for severe anxiety/agitation.   Collaboration of Care: Collaboration of Care:   Patient/Guardian was advised Release of Information must be obtained prior to any record release in order to collaborate their care with an outside provider. Patient/Guardian was advised if they have not already done so to contact the registration department to sign all necessary forms in order for [place] to release information regarding their care.   Consent: Patient/Guardian gives verbal consent for treatment and assignment of benefits for services provided during this visit. Patient/Guardian expressed understanding and agreed to proceed.    Lauro Franklin, MD 12/26/2021, 3:38 PM

## 2022-02-20 ENCOUNTER — Ambulatory Visit (HOSPITAL_COMMUNITY): Payer: Medicare Other | Admitting: Student in an Organized Health Care Education/Training Program

## 2022-02-20 ENCOUNTER — Encounter (HOSPITAL_COMMUNITY): Payer: Self-pay | Admitting: Student in an Organized Health Care Education/Training Program

## 2022-02-20 DIAGNOSIS — G4700 Insomnia, unspecified: Secondary | ICD-10-CM

## 2022-02-20 DIAGNOSIS — F3170 Bipolar disorder, currently in remission, most recent episode unspecified: Secondary | ICD-10-CM

## 2022-02-20 MED ORDER — ZALEPLON 10 MG PO CAPS: 10.0000 mg | ORAL_CAPSULE | Freq: Every evening | ORAL | 0 refills | Status: DC | PRN

## 2022-02-20 NOTE — Progress Notes (Signed)
BH MD/PA/NP OP Progress Note  02/20/2022 2:19 PM Matthew [name]  MRN:  [id number]  Chief Complaint:  Chief Complaint  Patient presents with   Follow-up   HPI:  Matthew [name] is a 66 yr old male who presents for follow up and for medication management.  PPHx is significant for Bipolar Disorder, no Suicide Attempts or Self Injurious Behavior, and 1 prior Hospitalization (approx 30 yrs ago).   Ports he is continued to improve since his hospitalization for lithium toxicity.  He reports that the fogginess that was in his mind has almost completely disappeared.  (Was walking today without the aid of a walking cane) he reports that his sleep has been poor of late.  He reports that he ran out of his Kathaleen Bury about 3 weeks ago and so for the last 2 weeks has only been able to sleep 4 to 5 hours a night.  He reports usually he only needs to take it at most once every 3 nights.  Discussed with him that we could continue this and he is agreeable to it.  He reports he is having his hernia repair surgery next Monday and is looking forward to this pain finally being dealt with.  He reports no SI, HI, or AVH.  He reports his appetite is doing good.  He reports no other concerns at present.  He will return for follow-up in approximately 3 months.   Visit Diagnosis:    ICD-10-CM   1. Bipolar I disorder in remission (HCC)  F31.70     2. Insomnia, unspecified type  G47.00 zaleplon (SONATA) 10 MG capsule      Past Psychiatric History: Bipolar Disorder, no Suicide Attempts or Self Injurious Behavior, and 1 prior Hospitalization (approx 30 yrs ago).  Past Medical History:  Past Medical History:  Diagnosis Date   Anxiety    on meds   AVM (arteriovenous malformation) of colon 02/04/2021   Bipolar I disorder, most recent episode (or current) mixed, moderate    GERD (gastroesophageal reflux disease)    Hypertension    on meds   Migraines    OSA (obstructive sleep apnea)    retested-bipap not  needed   Panic attacks    Seizures (HCC)    last known 05/2020;   Substance abuse (HCC)     Past Surgical History:  Procedure Laterality Date   CARPOMETACARPEL (CMC) FUSION OF THUMB WITH AUTOGRAFT FROM RADIUS Bilateral    COLONOSCOPY  2011   [facility]-   FINGER ARTHROPLASTY Left 06/17/2012   Procedure: FINGER ARTHROPLASTY;  Surgeon: Jodi Marble, MD;  Location: [facility];  Service: Orthopedics;  Laterality: Left;  left thumb suspension arthroplasty    HEMORRHOID SURGERY  2000   Dr Earlene Plater   TONSILLECTOMY     WISDOM TOOTH EXTRACTION      Family Psychiatric History: Mother- Depression Niece- Depression/Bipolar, 1 Suicide Attempt Cousin- Substance Abuse ([place])  Family History:  Family History  Problem Relation Age of Onset   Depression Mother    Hypertension Father    Cancer Father 72       brain tumor   Heart disease Paternal Grandmother    Arthritis Other    Hypertension Other    Colon polyps Neg Hx    Colon cancer Neg Hx    Esophageal cancer Neg Hx    Rectal cancer Neg Hx    Stomach cancer Neg Hx     Social History:  Social History  Socioeconomic History   Marital status: Married    Spouse name: susan   Number of children: Not on file   Years of education: Not on file   Highest education level: Not on file  Occupational History   Occupation: Disability    Comment: Bipolar disorder  Tobacco Use   Smoking status: Former    Types: Pipe, Cigars   Smokeless tobacco: Never  Vaping Use   Vaping Use: Never used  Substance and Sexual Activity   Alcohol use: No   Drug use: No    Types: Cocaine    Comment: past history of use-   Sexual activity: Not on file  Other Topics Concern   Not on file  Social History Narrative   Lives with wife   Right Handed   Drinks 3-4 cups caffeine daily      Social Determinants of Health   Financial Resource Strain: Not on file  Food Insecurity: Not on file  Transportation Needs: Not on file   Physical Activity: Not on file  Stress: Not on file  Social Connections: Not on file    Allergies:  Allergies  Allergen Reactions   Depakote [Divalproex Sodium] Nausea And Vomiting   Seroquel [Quetiapine Fumarate] Nausea And Vomiting   Sumatriptan Other (See Comments)     dizziness   Tramadol     Severe dizziness, restless leg, jittery    Metabolic Disorder Labs: Lab Results  Component Value Date   HGBA1C 4.6 (L) 07/07/2021   MPG 85.32 07/07/2021   No results found for: "PROLACTIN" Lab Results  Component Value Date   CHOL 122 07/07/2021   TRIG 67 07/07/2021   HDL 34 (L) 07/07/2021   CHOLHDL 3.6 07/07/2021   VLDL 13 07/07/2021   LDLCALC 75 07/07/2021   LDLCALC 90 10/24/2010   Lab Results  Component Value Date   TSH 1.088 10/31/2021   TSH 1.207 10/31/2021    Therapeutic Level Labs: Lab Results  Component Value Date   LITHIUM 0.33 (L) 11/04/2021   LITHIUM 0.50 (L) 11/03/2021   Lab Results  Component Value Date   VALPROATE 42 (L) 10/31/2021   VALPROATE 61.0 10/24/2010   No results found for: "CBMZ"  Current Medications: Current Outpatient Medications  Medication Sig Dispense Refill   amLODipine (NORVASC) 10 MG tablet Take 1 tablet (10 mg total) by mouth daily. 30 tablet 0   Calcium Carbonate Antacid (TUMS PO) Take 1 tablet by mouth daily as needed (heartburn).     carvedilol (COREG) 3.125 MG tablet Take 1 tablet (3.125 mg total) by mouth 2 (two) times daily with a meal. 60 tablet 0   Cholecalciferol (VITAMIN D3) 250 MCG (10000 UT) capsule Take 10,000 Units by mouth once a week.     clonazePAM (KLONOPIN) 1 MG tablet Take 1 tablet (1 mg total) by mouth daily as needed (for severe anxiety or agitation). 5 tablet 0   lamoTRIgine (LAMICTAL) 200 MG tablet Take 1 tablet (200 mg total) by mouth 2 (two) times daily. 60 tablet 3   zaleplon (SONATA) 10 MG capsule Take 1 capsule (10 mg total) by mouth at bedtime as needed for sleep. 30 capsule 0   No current  facility-administered medications for this visit.     Musculoskeletal: Strength & Muscle Tone: within normal limits Gait & Station: normal Patient leans: N/A  Psychiatric Specialty Exam: Review of Systems  Respiratory:  Negative for cough and shortness of breath.   Cardiovascular:  Negative for chest pain.  Gastrointestinal:  Negative  for abdominal pain, constipation, diarrhea, nausea and vomiting.  Neurological:  Negative for weakness and headaches.  Psychiatric/Behavioral:  Positive for sleep disturbance. Negative for dysphoric mood, hallucinations and suicidal ideas. The patient is not nervous/anxious.     There were no vitals taken for this visit.There is no height or weight on file to calculate BMI.  General Appearance: Casual and Fairly Groomed  Eye Contact:  Good  Speech:  Clear and Coherent and Normal Rate  Volume:  Normal  Mood:   "great"  Affect:  Appropriate and Congruent  Thought Process:  Coherent and Goal Directed  Orientation:  Full (Time, Place, and Person)  Thought Content: WDL and Logical   Suicidal Thoughts:  No  Homicidal Thoughts:  No  Memory:  Immediate;   Good Recent;   Good  Judgement:  Good  Insight:  Good  Psychomotor Activity:  Normal  Concentration:  Concentration: Good and Attention Span: Good  Recall:  Good  Fund of Knowledge: Good  Language: Good  Akathisia:  Negative  Handed:  Right  AIMS (if indicated): not done  Assets:  Communication Skills Desire for Improvement Financial Resources/Insurance Housing Resilience Social Support  ADL's:  Intact  Cognition: WNL  Sleep:  Poor recently (last 2 weeks)   Screenings: [facility] to Hosp-Admission (Discharged) from 07/05/2021 in [place] NEURO/TRAUMA/SURGICAL ICU  CAGE-AID Score 0      Flowsheet Row ED from 11/19/2021 in [place] ED to Hosp-Admission (Discharged) from 07/05/2021 in [place] NEURO/TRAUMA/SURGICAL ICU  C-SSRS RISK  CATEGORY No Risk No Risk        Assessment and Plan:  Matthew [name] is a 66 yr old male who presents for follow up and for medication management.  PPHx is significant for Bipolar Disorder, no Suicide Attempts or Self Injurious Behavior, and 1 prior Hospitalization (approx 30 yrs ago).   "Matthew [name]" has continued to improve from his Lithium toxicity and is doing much better.  He has tolerated the stopping of his Lithium without any instability.  He has run out of his Read Drivers and has been having poor sleep for the last 2 weeks.  He only needs it every 3 plus days so will provide enough for him to have for the next 3 months until his follow up appointment.  He will return for follow up in approximately 3 months.   Bipolar Disorder, Full Remission: -Continue Lamictal 200 mg BID for Mood Stability and Seizure Precaution.  Prescribed by Neurology. -Continue Klonopin 1 mg PRN daily for severe anxiety/agitation. No refills sent.   Insomnia: -Continue Sonata 10 mg QHS PRN.  30 tablets with 0 refills to last for 3 months until follow up.   Collaboration of Care:   Patient/Guardian was advised Release of Information must be obtained prior to any record release in order to collaborate their care with an outside provider. Patient/Guardian was advised if they have not already done so to contact the registration department to sign all necessary forms in order for [place] to release information regarding their care.   Consent: Patient/Guardian gives verbal consent for treatment and assignment of benefits for services provided during this visit. Patient/Guardian expressed understanding and agreed to proceed.    Briant Cedar, MD 02/20/2022, 2:19 PM

## 2022-03-01 HISTORY — PX: HERNIA REPAIR: SHX51

## 2022-03-03 ENCOUNTER — Emergency Department (HOSPITAL_BASED_OUTPATIENT_CLINIC_OR_DEPARTMENT_OTHER): Payer: Medicare Other

## 2022-03-03 ENCOUNTER — Encounter (HOSPITAL_COMMUNITY): Payer: Self-pay

## 2022-03-03 ENCOUNTER — Other Ambulatory Visit: Payer: Self-pay

## 2022-03-03 ENCOUNTER — Encounter (HOSPITAL_BASED_OUTPATIENT_CLINIC_OR_DEPARTMENT_OTHER): Payer: Self-pay | Admitting: Urology

## 2022-03-03 ENCOUNTER — Inpatient Hospital Stay: Admit: 2022-03-03 | Payer: Medicare Other | Admitting: Internal Medicine

## 2022-03-03 ENCOUNTER — Inpatient Hospital Stay (HOSPITAL_BASED_OUTPATIENT_CLINIC_OR_DEPARTMENT_OTHER)
Admission: EM | Admit: 2022-03-03 | Discharge: 2022-03-09 | DRG: 394 | Disposition: A | Discharge status: Home / Self Care | Payer: Medicare Other | Attending: Internal Medicine | Admitting: Internal Medicine

## 2022-03-03 DIAGNOSIS — K4020 Bilateral inguinal hernia, without obstruction or gangrene, not specified as recurrent: Secondary | ICD-10-CM | POA: Diagnosis present

## 2022-03-03 DIAGNOSIS — Z9889 Other specified postprocedural states: Secondary | ICD-10-CM

## 2022-03-03 DIAGNOSIS — F410 Panic disorder [episodic paroxysmal anxiety] without agoraphobia: Secondary | ICD-10-CM | POA: Diagnosis present

## 2022-03-03 DIAGNOSIS — F319 Bipolar disorder, unspecified: Secondary | ICD-10-CM | POA: Diagnosis present

## 2022-03-03 DIAGNOSIS — G4733 Obstructive sleep apnea (adult) (pediatric): Secondary | ICD-10-CM | POA: Diagnosis present

## 2022-03-03 DIAGNOSIS — Y838 Other surgical procedures as the cause of abnormal reaction of the patient, or of later complication, without mention of misadventure at the time of the procedure: Secondary | ICD-10-CM | POA: Diagnosis present

## 2022-03-03 DIAGNOSIS — G43919 Migraine, unspecified, intractable, without status migrainosus: Secondary | ICD-10-CM | POA: Diagnosis present

## 2022-03-03 DIAGNOSIS — R1110 Vomiting, unspecified: Secondary | ICD-10-CM | POA: Diagnosis not present

## 2022-03-03 DIAGNOSIS — R569 Unspecified convulsions: Secondary | ICD-10-CM

## 2022-03-03 DIAGNOSIS — Z79899 Other long term (current) drug therapy: Secondary | ICD-10-CM

## 2022-03-03 DIAGNOSIS — K219 Gastro-esophageal reflux disease without esophagitis: Secondary | ICD-10-CM | POA: Diagnosis present

## 2022-03-03 DIAGNOSIS — Z885 Allergy status to narcotic agent status: Secondary | ICD-10-CM

## 2022-03-03 DIAGNOSIS — G8918 Other acute postprocedural pain: Principal | ICD-10-CM

## 2022-03-03 DIAGNOSIS — K567 Ileus, unspecified: Secondary | ICD-10-CM | POA: Diagnosis present

## 2022-03-03 DIAGNOSIS — Z8249 Family history of ischemic heart disease and other diseases of the circulatory system: Secondary | ICD-10-CM

## 2022-03-03 DIAGNOSIS — G40909 Epilepsy, unspecified, not intractable, without status epilepticus: Secondary | ICD-10-CM | POA: Diagnosis present

## 2022-03-03 DIAGNOSIS — R112 Nausea with vomiting, unspecified: Secondary | ICD-10-CM

## 2022-03-03 DIAGNOSIS — E875 Hyperkalemia: Secondary | ICD-10-CM | POA: Diagnosis not present

## 2022-03-03 DIAGNOSIS — K9189 Other postprocedural complications and disorders of digestive system: Secondary | ICD-10-CM | POA: Diagnosis not present

## 2022-03-03 DIAGNOSIS — I10 Essential (primary) hypertension: Secondary | ICD-10-CM | POA: Diagnosis present

## 2022-03-03 DIAGNOSIS — K668 Other specified disorders of peritoneum: Secondary | ICD-10-CM | POA: Diagnosis present

## 2022-03-03 DIAGNOSIS — E876 Hypokalemia: Secondary | ICD-10-CM | POA: Diagnosis present

## 2022-03-03 DIAGNOSIS — I160 Hypertensive urgency: Secondary | ICD-10-CM | POA: Diagnosis present

## 2022-03-03 DIAGNOSIS — Z87891 Personal history of nicotine dependence: Secondary | ICD-10-CM

## 2022-03-03 DIAGNOSIS — Z888 Allergy status to other drugs, medicaments and biological substances status: Secondary | ICD-10-CM

## 2022-03-03 LAB — COMPREHENSIVE METABOLIC PANEL
ALT: 12 U/L (ref 0–44)
AST: 20 U/L (ref 15–41)
Albumin: 4.3 g/dL (ref 3.5–5.0)
Alkaline Phosphatase: 52 U/L (ref 38–126)
Anion gap: 10 (ref 5–15)
BUN: 22 mg/dL (ref 8–23)
CO2: 22 mmol/L (ref 22–32)
Calcium: 9.1 mg/dL (ref 8.9–10.3)
Chloride: 103 mmol/L (ref 98–111)
Creatinine, Ser: 0.87 mg/dL (ref 0.61–1.24)
GFR, Estimated: >60 mL/min (ref >60)
Glucose, Bld: 137 mg/dL — ABNORMAL HIGH (ref 70–99)
Potassium: 3.1 mmol/L — ABNORMAL LOW (ref 3.5–5.1)
Sodium: 135 mmol/L (ref 135–145)
Total Bilirubin: 1 mg/dL (ref 0.3–1.2)
Total Protein: 7.3 g/dL (ref 6.5–8.1)

## 2022-03-03 LAB — URINALYSIS, ROUTINE W REFLEX MICROSCOPIC
Bilirubin Urine: NEGATIVE
Glucose, UA: NEGATIVE mg/dL
Ketones, ur: 40 mg/dL — AB
Leukocytes,Ua: NEGATIVE
Nitrite: NEGATIVE
Protein, ur: NEGATIVE mg/dL
Specific Gravity, Urine: 1.015 (ref 1.005–1.030)
pH: 7 (ref 5.0–8.0)

## 2022-03-03 LAB — CREATININE, SERUM
Creatinine, Ser: 0.92 mg/dL (ref 0.61–1.24)
GFR, Estimated: >60 mL/min (ref >60)

## 2022-03-03 LAB — CBC
HCT: 41.9 % (ref 39.0–52.0)
HCT: 41.6 % (ref 39.0–52.0)
Hemoglobin: 14.8 g/dL (ref 13.0–17.0)
Hemoglobin: 14.9 g/dL (ref 13.0–17.0)
MCH: 30.2 pg (ref 26.0–34.0)
MCH: 30.7 pg (ref 26.0–34.0)
MCHC: 35.3 g/dL (ref 30.0–36.0)
MCHC: 35.8 g/dL (ref 30.0–36.0)
MCV: 85.5 fL (ref 80.0–100.0)
MCV: 85.8 fL (ref 80.0–100.0)
Platelets: 268 10*3/uL (ref 150–400)
Platelets: 261 10*3/uL (ref 150–400)
RBC: 4.9 MIL/uL (ref 4.22–5.81)
RBC: 4.85 MIL/uL (ref 4.22–5.81)
RDW: 12.1 % (ref 11.5–15.5)
RDW: 12.1 % (ref 11.5–15.5)
WBC: 14.7 10*3/uL — ABNORMAL HIGH (ref 4.0–10.5)
WBC: 16.3 10*3/uL — ABNORMAL HIGH (ref 4.0–10.5)
nRBC: 0 % (ref 0.0–0.2)
nRBC: 0 % (ref 0.0–0.2)

## 2022-03-03 LAB — AMMONIA: Ammonia: 21 umol/L (ref 9–35)

## 2022-03-03 LAB — LACTIC ACID, PLASMA: Lactic Acid, Venous: 1.5 mmol/L (ref 0.5–1.9)

## 2022-03-03 LAB — RAPID URINE DRUG SCREEN, HOSP PERFORMED
Amphetamines: NOT DETECTED
Barbiturates: NOT DETECTED
Benzodiazepines: NOT DETECTED
Cocaine: NOT DETECTED
Opiates: POSITIVE — AB
Tetrahydrocannabinol: POSITIVE — AB

## 2022-03-03 LAB — URINALYSIS, MICROSCOPIC (REFLEX)

## 2022-03-03 MED ORDER — LEVETIRACETAM IN NACL 1500 MG/100ML IV SOLN
1500.0000 mg | Freq: Once | INTRAVENOUS | Status: DC
  Filled 2022-03-03: qty 100

## 2022-03-03 MED ORDER — SODIUM CHLORIDE 0.9 % IV BOLUS
1000.0000 mL | Freq: Once | INTRAVENOUS | Status: AC
  Administered 2022-03-03: 1000 mL via INTRAVENOUS

## 2022-03-03 MED ORDER — HYDROMORPHONE HCL 1 MG/ML IJ SOLN
1.0000 mg | INTRAMUSCULAR | Status: DC | PRN
  Administered 2022-03-04 – 2022-03-05 (×5): 1 mg via INTRAVENOUS
  Filled 2022-03-03 (×5): qty 1

## 2022-03-03 MED ORDER — POTASSIUM CHLORIDE 10 MEQ/100ML IV SOLN
10.0000 meq | INTRAVENOUS | Status: AC
  Administered 2022-03-03 (×2): 10 meq via INTRAVENOUS
  Filled 2022-03-03 (×2): qty 100

## 2022-03-03 MED ORDER — AMLODIPINE BESYLATE 5 MG PO TABS
5.0000 mg | ORAL_TABLET | Freq: Every day | ORAL | Status: DC
  Administered 2022-03-03 – 2022-03-05 (×3): 5 mg via ORAL
  Filled 2022-03-03 (×4): qty 1

## 2022-03-03 MED ORDER — ONDANSETRON HCL 4 MG PO TABS: 4.0000 mg | ORAL_TABLET | Freq: Four times a day (QID) | ORAL | Status: DC | PRN

## 2022-03-03 MED ORDER — SODIUM CHLORIDE 0.9 % IV SOLN: Freq: Once | INTRAVENOUS | Status: AC

## 2022-03-03 MED ORDER — SODIUM CHLORIDE 0.9 % IV SOLN: INTRAVENOUS | Status: DC | PRN

## 2022-03-03 MED ORDER — LEVETIRACETAM IN NACL 1000 MG/100ML IV SOLN: 1000.0000 mg | Freq: Once | INTRAVENOUS | Status: DC

## 2022-03-03 MED ORDER — ONDANSETRON HCL 4 MG/2ML IJ SOLN
4.0000 mg | Freq: Four times a day (QID) | INTRAMUSCULAR | Status: DC | PRN
  Administered 2022-03-03 – 2022-03-04 (×3): 4 mg via INTRAVENOUS
  Filled 2022-03-03 (×3): qty 2

## 2022-03-03 MED ORDER — KCL IN DEXTROSE-NACL 40-5-0.9 MEQ/L-%-% IV SOLN
INTRAVENOUS | Status: DC
  Filled 2022-03-03 (×15): qty 1000

## 2022-03-03 MED ORDER — LEVETIRACETAM IN NACL 1000 MG/100ML IV SOLN
INTRAVENOUS | Status: AC
  Filled 2022-03-03: qty 100

## 2022-03-03 MED ORDER — POTASSIUM CHLORIDE CRYS ER 20 MEQ PO TBCR
40.0000 meq | EXTENDED_RELEASE_TABLET | Freq: Once | ORAL | Status: DC
  Filled 2022-03-03: qty 2

## 2022-03-03 MED ORDER — LEVETIRACETAM IN NACL 1000 MG/100ML IV SOLN
1000.0000 mg | Freq: Once | INTRAVENOUS | Status: AC
  Administered 2022-03-03: 1000 mg via INTRAVENOUS

## 2022-03-03 MED ORDER — LEVETIRACETAM 500 MG PO TABS
1000.0000 mg | ORAL_TABLET | Freq: Two times a day (BID) | ORAL | Status: DC
  Administered 2022-03-03 – 2022-03-04 (×2): 1000 mg via ORAL
  Filled 2022-03-03 (×2): qty 2

## 2022-03-03 MED ORDER — ONDANSETRON HCL 4 MG/2ML IJ SOLN
4.0000 mg | Freq: Once | INTRAMUSCULAR | Status: AC
  Administered 2022-03-03: 4 mg via INTRAVENOUS
  Filled 2022-03-03: qty 2

## 2022-03-03 MED ORDER — CARVEDILOL 3.125 MG PO TABS
3.1250 mg | ORAL_TABLET | Freq: Every day | ORAL | Status: DC
  Administered 2022-03-03 – 2022-03-09 (×7): 3.125 mg via ORAL
  Filled 2022-03-03 (×8): qty 1

## 2022-03-03 MED ORDER — LAMOTRIGINE 100 MG PO TABS
200.0000 mg | ORAL_TABLET | Freq: Once | ORAL | Status: AC
Start: 2022-03-03 — End: 2022-03-03
  Administered 2022-03-03: 200 mg via ORAL
  Filled 2022-03-03: qty 2

## 2022-03-03 MED ORDER — LEVETIRACETAM IN NACL 500 MG/100ML IV SOLN
500.0000 mg | Freq: Once | INTRAVENOUS | Status: AC
  Administered 2022-03-03: 500 mg via INTRAVENOUS

## 2022-03-03 MED ORDER — MORPHINE SULFATE (PF) 2 MG/ML IV SOLN
2.0000 mg | Freq: Once | INTRAVENOUS | Status: AC
  Administered 2022-03-03: 2 mg via INTRAVENOUS
  Filled 2022-03-03: qty 1

## 2022-03-03 MED ORDER — LEVETIRACETAM IN NACL 500 MG/100ML IV SOLN: 500.0000 mg | Freq: Once | INTRAVENOUS | Status: DC

## 2022-03-03 MED ORDER — LEVETIRACETAM IN NACL 500 MG/100ML IV SOLN
INTRAVENOUS | Status: AC
  Filled 2022-03-03: qty 100

## 2022-03-03 MED ORDER — ENOXAPARIN SODIUM 40 MG/0.4ML IJ SOSY
40.0000 mg | PREFILLED_SYRINGE | INTRAMUSCULAR | Status: DC
  Administered 2022-03-03 – 2022-03-08 (×6): 40 mg via SUBCUTANEOUS
  Filled 2022-03-03 (×5): qty 0.4

## 2022-03-03 MED ORDER — IOHEXOL 300 MG/ML  SOLN
100.0000 mL | Freq: Once | INTRAMUSCULAR | Status: AC | PRN
  Administered 2022-03-03: 100 mL via INTRAVENOUS

## 2022-03-03 NOTE — H&P (Signed)
History and Physical    Patient: Matthew [name] AST:[id number] DOB: 08/28/1955 DOA: 03/03/2022 DOS: the patient was seen and examined on 03/03/2022 PCP: Soundra Pilon, FNP  Patient coming from: Home  Chief Complaint:  Chief Complaint  Patient presents with   Post-op Problem   HPI: Matthew [name] is a 66 y.o. male with medical history significant of essential hypertension, GERD, obstructive sleep apnea, seizure disorder, anxiety disorder, history of AVMs, bipolar 1 disorder who has been off his lamotrigine for briefly for his seizures.  He normally takes them twice a day.  Patient presented to med [facility] with concern for fever abdominal pain and altered mental status.  Also persistent nausea vomiting.  He apparently just had bilateral inguinal hernia repair for that reason he did not take his lamotrigine for the 2 days.  He was suspected to have had breakthrough seizure with postictal symptoms which was the reason for coming to the ER.  In the ER however patient was having significant abdominal pain and CT scan showed findings consistent with pneumoperitoneum.  Not sure if this is perforation versus postop changes.  Patient did have some fever apparently this morning also.  He is therefore being admitted for further evaluation and treatment. Patient said his last vomiting was earlier in the day.  He is being kept n.p.o.  Review of Systems: As mentioned in the history of present illness. All other systems reviewed and are negative. Past Medical History:  Diagnosis Date   Anxiety    on meds   AVM (arteriovenous malformation) of colon 02/04/2021   Bipolar I disorder, most recent episode (or current) mixed, moderate    GERD (gastroesophageal reflux disease)    Hypertension    on meds   Migraines    OSA (obstructive sleep apnea)    retested-bipap not needed   Panic attacks    Seizures (HCC)    last known 05/2020;   Substance abuse (HCC)    Past Surgical History:   Procedure Laterality Date   CARPOMETACARPEL (CMC) FUSION OF THUMB WITH AUTOGRAFT FROM RADIUS Bilateral    COLONOSCOPY  2011   [facility] at [facility]-   FINGER ARTHROPLASTY Left 06/17/2012   Procedure: FINGER ARTHROPLASTY;  Surgeon: Jodi Marble, MD;  Location: [facility];  Service: Orthopedics;  Laterality: Left;  left thumb suspension arthroplasty    HEMORRHOID SURGERY  2000   Dr Earlene Plater   TONSILLECTOMY     WISDOM TOOTH EXTRACTION     Social History:  reports that he has quit smoking. His smoking use included pipe and cigars. He has never used smokeless tobacco. He reports that he does not drink alcohol and does not use drugs.  Allergies  Allergen Reactions   Depakote [Divalproex Sodium] Nausea And Vomiting   Seroquel [Quetiapine Fumarate] Nausea And Vomiting   Sumatriptan Other (See Comments)     dizziness   Tramadol     Severe dizziness, restless leg, jittery    Family History  Problem Relation Age of Onset   Depression Mother    Hypertension Father    Cancer Father 23       brain tumor   Heart disease Paternal Grandmother    Arthritis Other    Hypertension Other    Colon polyps Neg Hx    Colon cancer Neg Hx    Esophageal cancer Neg Hx    Rectal cancer Neg Hx    Stomach cancer Neg Hx     Prior to  Admission medications   Medication Sig Start Date End Date Taking? Authorizing Provider  amLODipine (NORVASC) 10 MG tablet Take 1 tablet (10 mg total) by mouth daily. 11/05/21   Thurnell Lose, MD  Calcium Carbonate Antacid (TUMS PO) Take 1 tablet by mouth daily as needed (heartburn).    [provider]  carvedilol (COREG) 3.125 MG tablet Take 1 tablet (3.125 mg total) by mouth 2 (two) times daily with a meal. 11/04/21   Thurnell Lose, MD  Cholecalciferol (VITAMIN D3) 250 MCG (10000 UT) capsule Take 10,000 Units by mouth once a week.    [provider]  clonazePAM (KLONOPIN) 1 MG tablet Take 1 tablet (1 mg total) by mouth daily as needed (for  severe anxiety or agitation). 11/04/21   Thurnell Lose, MD  lamoTRIgine (LAMICTAL) 200 MG tablet Take 1 tablet (200 mg total) by mouth 2 (two) times daily. 12/26/21   Briant Cedar, MD  zaleplon (SONATA) 10 MG capsule Take 1 capsule (10 mg total) by mouth at bedtime as needed for sleep. 02/20/22   Briant Cedar, MD    Physical Exam: Vitals:   03/03/22 1730 03/03/22 1811 03/03/22 1815 03/03/22 1931  BP: (!) 171/100  (!) 157/99 (!) 140/94  Pulse: 88  79 81  Resp: 17  20 19   Temp:  98.6 F (37 C)  98.3 F (36.8 C)  TempSrc:  Oral  Oral  SpO2: 96%  93% 100%  Weight:    67.5 kg  Height:    5\' 10"  (1.778 m)   Constitutional: NAD, calm, comfortable Eyes: PERRL, lids and conjunctivae normal ENMT: Mucous membranes are moist. Posterior pharynx clear of any exudate or lesions.Normal dentition.  Neck: normal, supple, no masses, no thyromegaly Respiratory: clear to auscultation bilaterally, no wheezing, no crackles. Normal respiratory effort. No accessory muscle use.  Cardiovascular: Regular rate and rhythm, no murmurs / rubs / gallops. No extremity edema. 2+ pedal pulses. No carotid bruits.  Abdomen: Soft and diffusely tender, no masses palpated. No hepatosplenomegaly. Bowel sounds positive.  Musculoskeletal: Good range of motion, no joint swelling or tenderness, Skin: no rashes, lesions, ulcers. No induration Neurologic: CN 2-12 grossly intact. Sensation intact, DTR normal. Strength 5/5 in all 4.  Psychiatric: Normal judgment and insight. Alert and oriented x 3. Normal mood  Data Reviewed:  Sodium 135, potassium 3.1, chloride 103.  Glucose is 137.  White count 16.3.  Urinalysis essentially negative.  Urine drug screen is positive for opiates and THC.  Chest x-ray showed mild bibasilar opacities and pneumoperitoneum.  Head CT without contrast showed no acute findings.  CT abdomen and pelvis showed postop day 1 with multiple areas of pneumoperitoneum.  Assessment and  Plan:   #1 pneumoperitoneum: Possibly postop changes.  Surgery consulted.  Patient will be admitted for evaluation and monitoring.  I will keep n.p.o. except for meds until surgery has seen and evaluated patient.  #2 breakthrough seizure: Patient was being evaluated by neurology.  He has received Keppra today.  Recommendation is to resume his Lamictal.  Continue with per neurology recommendation  #3 hypokalemia: Replete potassium.  #4 postoperative day #1 status post bilateral inguinal hernia repair: Continue per general surgery  #5 bipolar disorder: Stable.  Continue to monitor.  #6 essential hypertension: Continue blood pressure medications.  #7 obstructive sleep apnea: CPAP as needed.     Advance Care Planning:   Code Status: Prior full code  Consults: Dr. Malen Gauze, neurology  Family Communication: No family at bedside  Severity of Illness: The appropriate patient status for this patient is INPATIENT. Inpatient status is judged to be reasonable and necessary in order to provide the required intensity of service to ensure the patient's safety. The patient's presenting symptoms, physical exam findings, and initial radiographic and laboratory data in the context of their chronic comorbidities is felt to place them at high risk for further clinical deterioration. Furthermore, it is not anticipated that the patient will be medically stable for discharge from the hospital within 2 midnights of admission.   * I certify that at the point of admission it is my clinical judgment that the patient will require inpatient hospital care spanning beyond 2 midnights from the point of admission due to high intensity of service, high risk for further deterioration and high frequency of surveillance required.*  AuthorLonia Blood, MD 03/03/2022 7:51 PM  For on call review [email or web address].

## 2022-03-03 NOTE — ED Notes (Signed)
ED Provider at bedside. 

## 2022-03-03 NOTE — Plan of Care (Signed)
  Problem: Education: Goal: Knowledge of General Education information will improve Description Including pain rating scale, medication(s)/side effects and non-pharmacologic comfort measures Outcome: Progressing   

## 2022-03-03 NOTE — ED Notes (Signed)
NSS added at 250/hr, for comfort with K.

## 2022-03-03 NOTE — ED Notes (Signed)
Sending pharmacy a note because we do not have 1500mg /162ml in house . We have 1000mg /190ml and 500mg /124ml in levetiracetam. Waiting to see how they advise

## 2022-03-03 NOTE — ED Provider Notes (Signed)
[facility]ENCY DEPARTMENT Provider Note   CSN: [id number] Arrival date & time: 03/03/22  1112     History {Add pertinent medical, surgical, social history, OB history to HPI:1} Chief Complaint  Patient presents with   Post-op Problem    Matthew [name] is a 66 y.o. male with past medical history significant for bipolar, anxiety, panic attacks, previous seizures who supposed be taking lamotrigine twice daily presents with concern for fever, abdominal pain, altered mental status, nausea, vomiting.  Patient had bilateral inguinal hernia repair, reports that he did not take his last night dose of lamotrigine or this morning.  He typically does have some altered mental status, confusion after seizures, his partner reports that he was having difficulty moving legs, and seems somewhat out of it.  Since then he has had greater than 20 episodes of emesis, fever 100.6 this morning.  On my exam he is alert and oriented only to self but not to time or place, and his answers are confused.  HPI     Home Medications Prior to Admission medications   Medication Sig Start Date End Date Taking? Authorizing Provider  amLODipine (NORVASC) 10 MG tablet Take 1 tablet (10 mg total) by mouth daily. 11/05/21   Thurnell Lose, MD  Calcium Carbonate Antacid (TUMS PO) Take 1 tablet by mouth daily as needed (heartburn).    [provider]  carvedilol (COREG) 3.125 MG tablet Take 1 tablet (3.125 mg total) by mouth 2 (two) times daily with a meal. 11/04/21   Thurnell Lose, MD  Cholecalciferol (VITAMIN D3) 250 MCG (10000 UT) capsule Take 10,000 Units by mouth once a week.    [provider]  clonazePAM (KLONOPIN) 1 MG tablet Take 1 tablet (1 mg total) by mouth daily as needed (for severe anxiety or agitation). 11/04/21   Thurnell Lose, MD  lamoTRIgine (LAMICTAL) 200 MG tablet Take 1 tablet (200 mg total) by mouth 2 (two) times daily. 12/26/21   Briant Cedar, MD   zaleplon (SONATA) 10 MG capsule Take 1 capsule (10 mg total) by mouth at bedtime as needed for sleep. 02/20/22   Briant Cedar, MD      Allergies    Depakote [divalproex sodium], Seroquel [quetiapine fumarate], Sumatriptan, and Tramadol    Review of Systems   Review of Systems  Constitutional:  Positive for fever.  Gastrointestinal:  Positive for abdominal pain.  Neurological:  Positive for weakness.  Psychiatric/Behavioral:  Positive for confusion.   All other systems reviewed and are negative.   Physical Exam Updated Vital Signs BP (!) 154/102 (BP Location: Right Arm)   Pulse 99   Temp 99 F (37.2 C) (Oral)   Resp (!) 22   Ht 5\' 10"  (1.778 m)   Wt 65.4 kg   SpO2 100%   BMI 20.69 kg/m  Physical Exam Vitals and nursing note reviewed.  Constitutional:      General: He is not in acute distress.    Appearance: Normal appearance. He is ill-appearing.  HENT:     Head: Normocephalic and atraumatic.  Eyes:     General:        Right eye: No discharge.        Left eye: No discharge.  Cardiovascular:     Rate and Rhythm: Normal rate and regular rhythm.     Heart sounds: No murmur heard.    No friction rub. No gallop.  Pulmonary:     Effort: Pulmonary effort is normal.  Breath sounds: Normal breath sounds.  Abdominal:     General: Bowel sounds are normal.     Palpations: Abdomen is soft.     Comments: Surgical scars appear appropriate without significant redness, bulging, there is some swelling around the testicles and groin with tenderness palpation throughout, somewhat expected day 1 postop  Skin:    General: Skin is warm.     Capillary Refill: Capillary refill takes less than 2 seconds.     Comments: Warm/diaphoretic  Neurological:     Mental Status: He is alert.     Comments: Alert and oriented only to self, moves all 4 limbs spontaneously, some difficulty following commands.  Intact extraocular movements, pupils are equal round reactive to light   Psychiatric:        Mood and Affect: Mood normal.        Behavior: Behavior normal.     ED Results / Procedures / Treatments   Labs (all labs ordered are listed, but only abnormal results are displayed) Labs Reviewed  CULTURE, BLOOD (ROUTINE X 2)  CULTURE, BLOOD (ROUTINE X 2)  CBC  COMPREHENSIVE METABOLIC PANEL  LACTIC ACID, PLASMA  LACTIC ACID, PLASMA  URINALYSIS, ROUTINE W REFLEX MICROSCOPIC  LAMOTRIGINE LEVEL  AMMONIA  RAPID URINE DRUG SCREEN, HOSP PERFORMED    EKG None  Radiology No results found.  Procedures Procedures  {Document cardiac monitor, telemetry assessment procedure when appropriate:1}  Medications Ordered in ED Medications  sodium chloride 0.9 % bolus 1,000 mL (has no administration in time range)  ondansetron (ZOFRAN) injection 4 mg (has no administration in time range)  morphine (PF) 2 MG/ML injection 2 mg (has no administration in time range)    ED Course/ Medical Decision Making/ A&P                           Medical Decision Making Amount and/or Complexity of Data Reviewed Labs: ordered. Radiology: ordered.  Risk Prescription drug management.   ***  {Document critical care time when appropriate:1} {Document review of labs and clinical decision tools ie heart score, Chads2Vasc2 etc:1}  {Document your independent review of radiology images, and any outside records:1} {Document your discussion with family members, caretakers, and with consultants:1} {Document social determinants of health affecting pt's care:1} {Document your decision making why or why not admission, treatments were needed:1} Final Clinical Impression(s) / ED Diagnoses Final diagnoses:  None    Rx / DC Orders ED Discharge Orders     None

## 2022-03-03 NOTE — ED Notes (Signed)
Pharmacy states to room both keppra at the same time

## 2022-03-03 NOTE — ED Notes (Signed)
Called surgeon at [facility] for Consult with our provider about surgery that was done this week at [facility] [address] Rosendo Gros surgeon transferred call to our provider at 2:54

## 2022-03-03 NOTE — Progress Notes (Addendum)
Based on phone discussion with Matthew [name].  66 year old male with a history of seizures who is managed with lamotrigine.  He also has a history of postictal confusion.  He has missed two doses of his lamotrigine and in that setting had a breakthrough seizure.  He is currently still confused, but able to speak able to follow commands.  My suspicion is that he has someone who has a prolonged postictal phase, as evidenced by his past.  With him being unable to take his regular medications, I do think it would be reasonable to bring him in for IV antiepileptics to avoid further seizures at home.  I would load him with IV Keppra 1500 mg x 1, followed by Keppra 1 g BID.   As long as he continues to improve and returns to baseline, the management would simply consist of Keppra while still unable to take p.o., and resuming his lamotrigine 200 mg twice daily once he is able to take p.o. medication and reliably not vomit it back up.  If he does continue to be confused, he will need an EEG and neurology consultation.  Please call if this is the case.  Approximately 11 minutes of time were spent reviewing the case and discussion with Matthew [name], > 50% of which was spent in discussion.  Roland Rack, MD [facility] [phone]  If 7pm- 7am, please page neurology on call as listed in [place].

## 2022-03-03 NOTE — ED Notes (Addendum)
Cant keep anything down,vomited x 50  denies any diarrhea lower abdomial area pain has surgery yesterday.  Reports confusion. Last normal 10 am .Patient was able to hold arms up. Did not hold leg up wife states that he could not hold legs up after surgery yesterday. Patient had a weak grip wife states that he has a history of seizures

## 2022-03-03 NOTE — ED Notes (Signed)
Patient wife states patient does not take keppra notified provider .Provider states that what the neurologist ordered. Went to talk to patient and wife and they agreed to take the keppra

## 2022-03-03 NOTE — ED Notes (Signed)
Provider is aware that only one set of blood cultures were collects due to iv access . Provider states that it is ok

## 2022-03-03 NOTE — ED Notes (Signed)
Paged Neuro at 4:26.

## 2022-03-03 NOTE — ED Notes (Signed)
Patient is in ct 

## 2022-03-03 NOTE — ED Notes (Signed)
Provider is aware that only one set of blood cultures were collected and states that it is ok

## 2022-03-03 NOTE — ED Notes (Addendum)
Notified provider of patients condition,seizure pads on bed

## 2022-03-03 NOTE — ED Notes (Signed)
Pt got overheated under the blankets, took them off. Felt better. Reassessed vitals. Wife at bedside.

## 2022-03-03 NOTE — ED Triage Notes (Signed)
Pt had hernia surgery yesterday am, had a lot of pain Vomiting since last night, fever this am at home 100.6 Unable to take seizure medications, unable to tolerate fluids

## 2022-03-03 NOTE — Progress Notes (Signed)
New Admission Note:  Arrival Method: [facility] Mental Orientation: Alert and oriented x 3 Telemetry: Box 09 Assessment: Completed Skin: Warm and dry. Abdominal incision x3.  IV: NSL Pain: Denies  Tubes: N/A Safety Measures: Safety Fall Prevention Plan initiated.  Admission: Completed 5 M  Orientation: Patient has been orientated to the room, unit and the staff. Welcome booklet given.  Family: N/A  Orders have been reviewed and implemented. Will continue to monitor the patient. Call light has been placed within reach and bed alarm has been activated.   Sima Matas BSN, RN  Phone Number: [phone]

## 2022-03-03 NOTE — ED Notes (Signed)
[facility] for on-call, left message with service will call provider back at 3:19

## 2022-03-03 NOTE — Progress Notes (Signed)
66 year old with past medical history significant for seizure, on lamotrigine, presents with altered mental status, nausea, vomiting unable to take oral medication postsurgery of bilateral inguinal hernia repair by Dr. Rosendo Gros presents with confusion thought to be related to postictal state.  Neurology is recommending IV Keppra 1 g IV twice daily and Keppra load.  CT scan with considerable pneumoperitoneum and substantial gas tracking along the abdominal wall and along the groin bilaterally and multiple other findings.  Per surgery PA Richard Miu this are normal post operative finding.  -I have accepted patient to telemetry.  -I request Surgery consultation.  -keep NPO -Follow recommendation from Sx in regards Pneumoperitoneum.

## 2022-03-03 NOTE — Consult Note (Signed)
Neurology Consultation  Reason for Consult: Breakthrough seizure Referring Physician: Dr. Tyrell Antonio  CC: Breakthrough seizure  History is obtained from: Chart patient  HPI: Matthew [name] is a 66 y.o. male who has a past medical history of bipolar disorder, migraines, seizure disorder on lamotrigine, history of substance abuse, sleep apnea, hypertension presented to the emergency room with concerns for fever, abdominal pain altered mental status nausea and vomiting.  He had a bilateral inguinal hernia repair yesterday and has been in a lot of pain and has vomiting.  He also missed his lamotrigine which she is usually very compliant.  He was generally weak and appeared lethargic as he would when he is postictal.  I am not sure if the seizure was actually witnessed but it was presumed that he had a breakthrough seizure followed by prolonged postictal state in the setting of missed antiepileptic doses. Case was discussed with the on-call neurologist who recommended that his antiepileptics be switched to parenteral medication Keppra because of his inability to keep his p.o. medications down and if he is coming around, he can be switched back to his p.o. Lamictal when he is consistently able to have p.o. meds.  Due to him being lethargic for longer than the usual timeframe that is expected after seizure and recent surgery along with fever and nausea vomiting, he was admitted for observation.  Neurology consult was obtained for antiepileptic management  Continues to complain of some nausea and abdominal pain.   ROS: Full ROS was performed and is negative except as noted in the HPI.  Past Medical History:  Diagnosis Date   Anxiety    on meds   AVM (arteriovenous malformation) of colon 02/04/2021   Bipolar I disorder, most recent episode (or current) mixed, moderate    GERD (gastroesophageal reflux disease)    Hypertension    on meds   Migraines    OSA (obstructive sleep apnea)    retested-bipap  not needed   Panic attacks    Seizures ([place])    last known 05/2020;   Substance abuse ([place])      Family History  Problem Relation Age of Onset   Depression Mother    Hypertension Father    Cancer Father 69       brain tumor   Heart disease Paternal Grandmother    Arthritis Other    Hypertension Other    Colon polyps Neg Hx    Colon cancer Neg Hx    Esophageal cancer Neg Hx    Rectal cancer Neg Hx    Stomach cancer Neg Hx      Social History:   reports that he has quit smoking. His smoking use included pipe and cigars. He has never used smokeless tobacco. He reports that he does not drink alcohol and does not use drugs.  Medications  Current Facility-Administered Medications:    0.9 %  sodium chloride infusion, , Intravenous, PRN, Maylon Peppers, Victoria K, DO, Stopped at 03/03/22 1647   amLODipine (NORVASC) tablet 5 mg, 5 mg, Oral, Daily, Prosperi, Christian H, PA-C, 5 mg at 03/03/22 1815   carvedilol (COREG) tablet 3.125 mg, 3.125 mg, Oral, Daily, Prosperi, Christian H, PA-C, 3.125 mg at 03/03/22 1816   dextrose 5 % and 0.9 % NaCl with KCl 40 mEq/L infusion, , Intravenous, Continuous, Garba, Mohammad L, MD, Last Rate: 100 mL/hr at 03/03/22 2030, New Bag at 03/03/22 2030   enoxaparin (LOVENOX) injection 40 mg, 40 mg, Subcutaneous, Q24H, Jonelle Sidle, Henderson Newcomer, MD  HYDROmorphone (DILAUDID) injection 1 mg, 1 mg, Intravenous, Q2H PRN, Jonelle Sidle, Mohammad L, MD   levETIRAcetam (KEPPRA) tablet 1,000 mg, 1,000 mg, Oral, BID, Prosperi, Christian H, PA-C   ondansetron (ZOFRAN) tablet 4 mg, 4 mg, Oral, Q6H PRN **OR** ondansetron (ZOFRAN) injection 4 mg, 4 mg, Intravenous, Q6H PRN, Jonelle Sidle, Mohammad L, MD   potassium chloride SA (KLOR-CON M) CR tablet 40 mEq, 40 mEq, Oral, Once, Prosperi, Christian H, PA-C   Exam: Current vital signs: BP (!) 140/94 (BP Location: Right Arm)   Pulse 81   Temp 98.3 F (36.8 C) (Oral)   Resp 19   Ht _0  (1.778 m)   Wt 67.5 kg   SpO2 100%   BMI 21.35 kg/m   Vital signs in last 24 hours: Temp:  [98.3 F (36.8 C)-99 F (37.2 C)] 98.3 F (36.8 C) (11/03 1931) Pulse Rate:  [78-99] 81 (11/03 1931) Resp:  [16-23] 19 (11/03 1931) BP: (135-172)/(81-110) 140/94 (11/03 1931) SpO2:  [91 %-100 %] 100 % (11/03 1931) Weight:  [65.4 kg-67.5 kg] 67.5 kg (11/03 1931) General: Awake alert in some distress due to abdominal pain while moving his extremities but otherwise appears comfortable. HEENT: Normocephalic atraumatic Lungs: Clear Cardiovascular: Regular rhythm Abdomen: Mild tenderness to palpation Neurological exam Awake alert oriented x2 Speech is not dysarthric Mildly aphasic with paraphasic errors Able to name objects, long sentences that she makes some word substitutions, repetition intact, comprehension to simple and complex commands intact. Cranial nerves II to XII intact Motor examination with no focal deficits Sensation intact Coordination with no dysmetria   Labs I have reviewed labs in epic and the results pertinent to this consultation are: CBC    Component Value Date/Time   WBC 16.3 (H) 03/03/2022 1936   RBC 4.85 03/03/2022 1936   HGB 14.9 03/03/2022 1936   HCT 41.6 03/03/2022 1936   PLT 261 03/03/2022 1936   MCV 85.8 03/03/2022 1936   MCH 30.7 03/03/2022 1936   MCHC 35.8 03/03/2022 1936   RDW 12.1 03/03/2022 1936   LYMPHSABS 1.8 11/19/2021 1716   MONOABS 0.4 11/19/2021 1716   EOSABS 0.3 11/19/2021 1716   BASOSABS 0.0 11/19/2021 1716    CMP     Component Value Date/Time   NA 135 03/03/2022 1203   NA 141 02/10/2021 1040   K 3.1 (L) 03/03/2022 1203   CL 103 03/03/2022 1203   CO2 22 03/03/2022 1203   GLUCOSE 137 (H) 03/03/2022 1203   GLUCOSE 144 (H) 05/11/2006 0818   BUN 22 03/03/2022 1203   BUN 13 02/10/2021 1040   CREATININE 0.92 03/03/2022 1936   CALCIUM 9.1 03/03/2022 1203   PROT 7.3 03/03/2022 1203   PROT 6.3 02/10/2021 1040   ALBUMIN 4.3 03/03/2022 1203   ALBUMIN 4.5 02/10/2021 1040   AST 20 03/03/2022  1203   ALT 12 03/03/2022 1203   ALKPHOS 52 03/03/2022 1203   BILITOT 1.0 03/03/2022 1203   BILITOT 0.4 02/10/2021 1040   GFRNONAA >60 03/03/2022 1936   GFRAA  06/09/2009 1707    >60        The eGFR has been calculated using the MDRD equation. This calculation has not been validated in all clinical situations. eGFR's persistently <60 mL/min signify possible Chronic Kidney Disease.   Imaging I have reviewed the images obtained:  Chest x-ray with pneumoperitoneum as expected postoperative.  No other acute findings  CT head Motion degraded-no acute findings  CT abdomen pelvis with postop day 1 status post groin hernia repair  with considerable pneumoperitoneum and substantial gas tracking along the abdominal wall and along the groin bilaterally with mild pneumoretroperitoneum with gas tracking along the lower chest wall and pneumomediastinum tracking adjacent to the distal esophagus and anterior to the heart.  Per radiology, this is substantially more gas than would be expected postop day 1 for bilateral groin hernia repair raising the possibility of issue related to abdominal insufflation versus hollow viscus perforation.  No evidence of bowel ischemia  Assessment:  66 year old man past history of bipolar disorder, migraines, seizures on lamotrigine, history of substance abuse sleep apnea hypertension presented after bilateral inguinal hernia surgery yesterday and fevers chills and abdominal pain along with altered mental status with presumed seizures due to missed lamotrigine. Currently seems to be returning back to baseline. Could have had breakthrough seizure due to missed doses versus physiological stressors from the surgery versus toxic metabolic encephalopathy in the setting of recent surgery and question of possible acute abdomen.  Impression: Breakthrough seizure versus toxic metabolic encephalopathy Pneumoperitoneum post abdominal surgery needing for patient to be n.p.o. and  possibly not being able to take p.o. medications-recommendations on antiepileptic below.  Recommendations: Since there was concern that he was not able to take his p.o. lamotrigine, Dr. Leonel Ramsay recommended that his antiepileptic be switched to [place] in case parenteral medications are required.  For now he is taking Keppra p.o. in spite of being n.p.o. for diet.  Surgery has yet to evaluate patient.  He is n.p.o. but he is taking his pills.  I am not sure if surgery would require him to be n.p.o. further.  Till all of that is sorted out, I will keep him on Keppra (because it has both oral and parenteral forms available) and once his diet is cleared, I would switch him back to Lamictal 200 twice daily and stop Keppra at that time.  Seizure precautions  Routine EEG  Plan discussed with admitting hospitalist Dr. Jonelle Sidle -- Amie Portland, MD Neurologist [facility] Pager: [phone]

## 2022-03-03 NOTE — ED Notes (Signed)
Patient did not swollen the potassium pill notified provider

## 2022-03-03 NOTE — ED Notes (Signed)
Pharmacy states that he will fix the levetiracetam order

## 2022-03-03 NOTE — ED Notes (Signed)
Patient was able to verbalize name dob wife name and year patient also had strong grips

## 2022-03-03 NOTE — ED Notes (Signed)
Patient is able to move rt arm. Denies any pain where the iv infiltrated. Some swelling is present heat pack is applied

## 2022-03-04 DIAGNOSIS — I10 Essential (primary) hypertension: Secondary | ICD-10-CM | POA: Diagnosis not present

## 2022-03-04 DIAGNOSIS — G4733 Obstructive sleep apnea (adult) (pediatric): Secondary | ICD-10-CM | POA: Diagnosis not present

## 2022-03-04 DIAGNOSIS — Z87891 Personal history of nicotine dependence: Secondary | ICD-10-CM | POA: Diagnosis not present

## 2022-03-04 DIAGNOSIS — E875 Hyperkalemia: Secondary | ICD-10-CM | POA: Diagnosis not present

## 2022-03-04 DIAGNOSIS — K4020 Bilateral inguinal hernia, without obstruction or gangrene, not specified as recurrent: Secondary | ICD-10-CM | POA: Diagnosis not present

## 2022-03-04 DIAGNOSIS — K9189 Other postprocedural complications and disorders of digestive system: Secondary | ICD-10-CM | POA: Diagnosis not present

## 2022-03-04 DIAGNOSIS — G43919 Migraine, unspecified, intractable, without status migrainosus: Secondary | ICD-10-CM | POA: Diagnosis not present

## 2022-03-04 DIAGNOSIS — Y838 Other surgical procedures as the cause of abnormal reaction of the patient, or of later complication, without mention of misadventure at the time of the procedure: Secondary | ICD-10-CM | POA: Diagnosis not present

## 2022-03-04 DIAGNOSIS — K567 Ileus, unspecified: Secondary | ICD-10-CM | POA: Diagnosis not present

## 2022-03-04 DIAGNOSIS — Z8249 Family history of ischemic heart disease and other diseases of the circulatory system: Secondary | ICD-10-CM | POA: Diagnosis not present

## 2022-03-04 DIAGNOSIS — G40909 Epilepsy, unspecified, not intractable, without status epilepticus: Secondary | ICD-10-CM | POA: Diagnosis not present

## 2022-03-04 DIAGNOSIS — F319 Bipolar disorder, unspecified: Secondary | ICD-10-CM | POA: Diagnosis not present

## 2022-03-04 DIAGNOSIS — R1110 Vomiting, unspecified: Secondary | ICD-10-CM | POA: Diagnosis present

## 2022-03-04 DIAGNOSIS — Z885 Allergy status to narcotic agent status: Secondary | ICD-10-CM | POA: Diagnosis not present

## 2022-03-04 DIAGNOSIS — E876 Hypokalemia: Secondary | ICD-10-CM | POA: Diagnosis not present

## 2022-03-04 DIAGNOSIS — Z79899 Other long term (current) drug therapy: Secondary | ICD-10-CM | POA: Diagnosis not present

## 2022-03-04 DIAGNOSIS — Z888 Allergy status to other drugs, medicaments and biological substances status: Secondary | ICD-10-CM | POA: Diagnosis not present

## 2022-03-04 DIAGNOSIS — I160 Hypertensive urgency: Secondary | ICD-10-CM | POA: Diagnosis not present

## 2022-03-04 DIAGNOSIS — K219 Gastro-esophageal reflux disease without esophagitis: Secondary | ICD-10-CM | POA: Diagnosis not present

## 2022-03-04 DIAGNOSIS — F410 Panic disorder [episodic paroxysmal anxiety] without agoraphobia: Secondary | ICD-10-CM | POA: Diagnosis not present

## 2022-03-04 LAB — COMPREHENSIVE METABOLIC PANEL
ALT: 10 U/L (ref 0–44)
AST: 14 U/L — ABNORMAL LOW (ref 15–41)
Albumin: 3.5 g/dL (ref 3.5–5.0)
Alkaline Phosphatase: 40 U/L (ref 38–126)
Anion gap: 9 (ref 5–15)
BUN: 16 mg/dL (ref 8–23)
CO2: 20 mmol/L — ABNORMAL LOW (ref 22–32)
Calcium: 9.1 mg/dL (ref 8.9–10.3)
Chloride: 111 mmol/L (ref 98–111)
Creatinine, Ser: 0.8 mg/dL (ref 0.61–1.24)
GFR, Estimated: >60 mL/min (ref >60)
Glucose, Bld: 117 mg/dL — ABNORMAL HIGH (ref 70–99)
Potassium: 3.7 mmol/L (ref 3.5–5.1)
Sodium: 140 mmol/L (ref 135–145)
Total Bilirubin: 1 mg/dL (ref 0.3–1.2)
Total Protein: 5.7 g/dL — ABNORMAL LOW (ref 6.5–8.1)

## 2022-03-04 LAB — CBC
HCT: 37 % — ABNORMAL LOW (ref 39.0–52.0)
Hemoglobin: 13 g/dL (ref 13.0–17.0)
MCH: 30.4 pg (ref 26.0–34.0)
MCHC: 35.1 g/dL (ref 30.0–36.0)
MCV: 86.7 fL (ref 80.0–100.0)
Platelets: 228 10*3/uL (ref 150–400)
RBC: 4.27 MIL/uL (ref 4.22–5.81)
RDW: 12.2 % (ref 11.5–15.5)
WBC: 13.9 10*3/uL — ABNORMAL HIGH (ref 4.0–10.5)
nRBC: 0 % (ref 0.0–0.2)

## 2022-03-04 LAB — GLUCOSE, CAPILLARY: Glucose-Capillary: 111 mg/dL — ABNORMAL HIGH (ref 70–99)

## 2022-03-04 MED ORDER — LAMOTRIGINE 100 MG PO TABS
200.0000 mg | ORAL_TABLET | Freq: Two times a day (BID) | ORAL | Status: DC
  Administered 2022-03-04 – 2022-03-09 (×11): 200 mg via ORAL
  Filled 2022-03-04 (×11): qty 2

## 2022-03-04 MED ORDER — PROCHLORPERAZINE EDISYLATE 10 MG/2ML IJ SOLN
5.0000 mg | Freq: Four times a day (QID) | INTRAMUSCULAR | Status: DC | PRN
  Administered 2022-03-04: 5 mg via INTRAVENOUS
  Filled 2022-03-04: qty 2

## 2022-03-04 MED ORDER — DOCUSATE SODIUM 100 MG PO CAPS
100.0000 mg | ORAL_CAPSULE | Freq: Two times a day (BID) | ORAL | Status: DC
  Administered 2022-03-04 – 2022-03-08 (×10): 100 mg via ORAL
  Filled 2022-03-04 (×11): qty 1

## 2022-03-04 MED ORDER — IBUPROFEN 400 MG PO TABS
400.0000 mg | ORAL_TABLET | Freq: Once | ORAL | Status: AC
  Administered 2022-03-04: 400 mg via ORAL
  Filled 2022-03-04: qty 1

## 2022-03-04 MED ORDER — DIPHENHYDRAMINE HCL 50 MG/ML IJ SOLN: 12.5000 mg | INTRAMUSCULAR | Status: DC

## 2022-03-04 MED ORDER — METOCLOPRAMIDE HCL 5 MG/ML IJ SOLN: 5.0000 mg | INTRAMUSCULAR | Status: DC

## 2022-03-04 MED ORDER — PROCHLORPERAZINE EDISYLATE 10 MG/2ML IJ SOLN: 5.0000 mg | Freq: Four times a day (QID) | INTRAMUSCULAR | Status: DC | PRN

## 2022-03-04 MED ORDER — KETOROLAC TROMETHAMINE 15 MG/ML IJ SOLN: 15.0000 mg | INTRAMUSCULAR | Status: DC

## 2022-03-04 NOTE — Progress Notes (Signed)
[facility] Progress Note     Subjective: CC-  Denies any abdominal pain. Still nauseated at times but zofran helps. No emesis since admission. No flatus or BM.  Objective: Vital signs in last 24 hours: Temp:  [98.2 F (36.8 C)-99 F (37.2 C)] 98.2 F (36.8 C) (11/04 0730) Pulse Rate:  [69-99] 69 (11/04 0730) Resp:  [15-23] 15 (11/04 0730) BP: (125-172)/(77-110) 145/89 (11/04 0730) SpO2:  [91 %-100 %] 96 % (11/04 0730) Weight:  [65.4 kg-67.5 kg] 67.5 kg (11/03 1931) Last BM Date : 03/02/22  Intake/Output from previous day: 11/03 0701 - 11/04 0700 In: 1699 [I.V.:499; IV Piggyback:1200] Out: 550 [Urine:550] Intake/Output this shift: Total I/O In: -  Out: 650 [Urine:650]  PE: Gen:  Alert, NAD, pleasant Abd: soft, nondistended, +bowel sounds, appropriately tender at incisions, mild bruising around incisions but no cellulitis or purulent drainage  Lab Results:  Recent Labs    03/03/22 1936 03/04/22 0307  WBC 16.3* 13.9*  HGB 14.9 13.0  HCT 41.6 37.0*  PLT 261 228   BMET Recent Labs    03/03/22 1203 03/03/22 1936 03/04/22 0307  NA 135  --  140  K 3.1*  --  3.7  CL 103  --  111  CO2 22  --  20*  GLUCOSE 137*  --  117*  BUN 22  --  16  CREATININE 0.87 0.92 0.80  CALCIUM 9.1  --  9.1   PT/INR No results for input(s): "LABPROT", "INR" in the last 72 hours. CMP     Component Value Date/Time   NA 140 03/04/2022 0307   NA 141 02/10/2021 1040   K 3.7 03/04/2022 0307   CL 111 03/04/2022 0307   CO2 20 (L) 03/04/2022 0307   GLUCOSE 117 (H) 03/04/2022 0307   GLUCOSE 144 (H) 05/11/2006 0818   BUN 16 03/04/2022 0307   BUN 13 02/10/2021 1040   CREATININE 0.80 03/04/2022 0307   CALCIUM 9.1 03/04/2022 0307   PROT 5.7 (L) 03/04/2022 0307   PROT 6.3 02/10/2021 1040   ALBUMIN 3.5 03/04/2022 0307   ALBUMIN 4.5 02/10/2021 1040   AST 14 (L) 03/04/2022 0307   ALT 10 03/04/2022 0307   ALKPHOS 40 03/04/2022 0307   BILITOT 1.0 03/04/2022 0307   BILITOT  0.4 02/10/2021 1040   GFRNONAA >60 03/04/2022 0307   GFRAA  06/09/2009 1707    >60        The eGFR has been calculated using the MDRD equation. This calculation has not been validated in all clinical situations. eGFR's persistently <60 mL/min signify possible Chronic Kidney Disease.   Lipase     Component Value Date/Time   LIPASE 45.0 07/27/2008 0824       Studies/Results: CT ABDOMEN PELVIS W CONTRAST  Addendum Date: 03/03/2022   ADDENDUM REPORT: 03/03/2022 14:56 ADDENDUM: The original report was by Dr. Van Clines. The following addendum is by Dr. Van Clines: Critical Value/emergent results were called by telephone at the time of interpretation on 03/03/2022 at 2:44 pm to provider [facility] , who verbally acknowledged these results. Electronically Signed   By: Van Clines M.D.   On: 03/03/2022 14:56   Result Date: 03/03/2022 CLINICAL DATA:  Postop day 1 status post bilateral hernia surgery. EXAM: CT ABDOMEN AND PELVIS WITH CONTRAST TECHNIQUE: Multidetector CT imaging of the abdomen and pelvis was performed using the standard protocol following bolus administration of intravenous contrast. RADIATION DOSE REDUCTION: This exam was performed according to the departmental dose-optimization program which  includes automated exposure control, adjustment of the mA and/or kV according to patient size and/or use of iterative reconstruction technique. CONTRAST:  139m OMNIPAQUE IOHEXOL 300 MG/ML  SOLN COMPARISON:  11/19/2021 CT scan FINDINGS: Lower chest: Pneumomediastinum with gas tracking anterior to the pericardium. There is also some gas tracking along the soft tissues of the thoracoabdominal wall and along the margins of the rib cage, right greater than left. Mild atelectasis in both lower lobes and in the lingula. No pleural effusion. Mild cardiomegaly noted. Small amount of pneumomediastinum tracks adjacent to the distal esophagus. No definite leak of the distal  esophagus identified. Hepatobiliary: Unremarkable Pancreas: Unremarkable Spleen: Unremarkable Adrenals/Urinary Tract: Small Bosniak category 1 right mid kidney cyst on image 33 series 2. Other small hypodense lesions of both kidneys are technically too small to characterize but statistically highly likely to represent cysts and do not require independent imaging follow up. Stomach/Bowel: There is substantial pneumoperitoneum, especially in the upper abdomen anterior to the liver, with extensive scattered small loculations of gas and some larger collections of anterior gas. Some of this gas is along the margins of the stomach and some along the margins of the liver and spleen, along the right omentum and left paracolic gutter, and scattered in the soft tissues of abdominal wall bilaterally. There is also some minimal trace retroperitoneal gas bilaterally. Some of the ileum tracks immediately adjacent to the right groin hernia repair as on images 68 through 73 of series 2, although a definite perforation in this vicinity is not identified. The appendix appears normal. A specific location of perforation is not identified. No definite pneumatosis or portal venous gas. Vascular/Lymphatic: Patent mesenteric vessels, no filling defect observed in the superior mesenteric artery. Small retroperitoneal lymph nodes are likely reactive. Reproductive: Upper normal sized prostate gland. Bilateral groin hernia repairs with typical edema pattern along the inguinal ring bilaterally, along with some edema tracking along both spermatic cords and a small amount of gas tracking along both spermatic cords down towards the scrotum. There is also a small amount of gas tracking in the soft tissues the pubis and along the margins of the penis for example on image 95 series 2. Small locules of gas track in the inguinal region and adjacent to the common femoral vessels. Other: In addition to the above scattered widespread and tonic locations  of gas, there is gas tracking along the distal iliopsoas musculature towards the lesser trochanters of the hips bilaterally. Musculoskeletal: Postoperative findings from recent groin hernia repair. Clips are noted anterior to the urinary bladder and along the lower linea alba and right rectus abdominus muscle. IMPRESSION: 1. Postop day 1 status post groin hernia repairs. There is considerable pneumoperitoneum and substantial gas tracking along the abdominal wall and along the groin bilaterally, mild pneumoretroperitoneum, gas tracking along the lower chest wall, and pneumomediastinum tracking adjacent to the distal esophagus and anterior to the heart. Based on today's chest radiograph there appears to be gas tracking along the right upper mediastinum and in the right neck. This is substantially more gas than I would expect postoperative day 1 for bilateral groin hernia repair, raising the possibility of issue related to abdominal insufflation if the surgery was laparoscopic, versus a otherwise occult hollow viscus perforation. The widespread distribution into various body compartments would be somewhat unusual for a perforated viscus which is more commonly contained to the peritoneal cavity. Correlate with prior operative note if available. 2. No direct compelling evidence of bowel ischemia; part of the distal small  bowel abuts the right groin hernia repair but I do not observe herniated or strangulated bowel in this vicinity. 3. Mild cardiomegaly. 4. Mild atelectasis in both lower lobes. Radiology assistant personnel have been notified to put me in telephone contact with the referring physician or the referring physician's clinical representative in order to discuss these findings. Once this communication is established I will issue an addendum to this report for documentation purposes. Electronically Signed: By: Van Clines M.D. On: 03/03/2022 14:39   CT Head Wo Contrast  Result Date:  03/03/2022 CLINICAL DATA:  Fever and confusion after hernia surgery. EXAM: CT HEAD WITHOUT CONTRAST TECHNIQUE: Contiguous axial images were obtained from the base of the skull through the vertex without intravenous contrast. RADIATION DOSE REDUCTION: This exam was performed according to the departmental dose-optimization program which includes automated exposure control, adjustment of the mA and/or kV according to patient size and/or use of iterative reconstruction technique. COMPARISON:  CT head 10/30/2021 FINDINGS: Image quality is degraded by motion artifact. Brain: There is no evidence of acute intracranial hemorrhage, extra-axial fluid collection, or acute infarct. Parenchymal volume is normal. The ventricles are normal in size. Gray-white differentiation is preserved. There is no mass lesion.  There is no mass effect or midline shift. Vascular: No hyperdense vessel or unexpected calcification. Skull: Normal. Negative for fracture or focal lesion. Sinuses/Orbits: The paranasal sinuses are clear. The globes and orbits are unremarkable. Other: None. IMPRESSION: Motion degraded study. Within this confine, no definite evidence of acute intracranial pathology. Electronically Signed   By: Valetta Mole M.D.   On: 03/03/2022 14:03   DG Chest 2 View  Result Date: 03/03/2022 CLINICAL DATA:  Rule out postop pneumonia EXAM: CHEST - 2 VIEW COMPARISON:  Chest x-ray dated November 03, 2021 FINDINGS: Cardiac and mediastinal contours within normal limits for AP technique. Mild bibasilar opacities, likely due to atelectasis. Pneumoperitoneum. No pleural effusion. No evidence of pneumothorax. Small amount of soft tissue gas seen at the right neck base. IMPRESSION: 1. Mild bibasilar opacities, likely due to atelectasis. 2. Pneumoperitoneum, likely postoperative. Electronically Signed   By: Yetta Glassman M.D.   On: 03/03/2022 14:01    Anti-infectives: Anti-infectives (From admission, onward)    None         Assessment/Plan  POD#2 s/p lap BIHR with mesh by Dr. Rosendo Gros 11/2  Here with breakthrough seizure - CT reviewed with MD, free air is an expected amount postoperatively. Abdominal exam benign. Ok for clear liquids, advance diet as tolerated. If patient tolerates this he would be ok for discharge from surgical standpoint. Seizure management per neurology/ primary team.     LOS: 0 days    Wellington Hampshire, [facility] 03/04/2022, 10:13 AM Please see Amion for pager number during day hours 7:00am-4:30pm

## 2022-03-04 NOTE — Plan of Care (Signed)
Able to take pills per Surgery.   Switching patient back to his home dosing regimen of Lamictal and discontinuing Keppra.   Neurohospitalist service will sign off. Please call if there are additional questions.   Electronically signed: Dr. Kerney Elbe

## 2022-03-04 NOTE — Progress Notes (Signed)
Patient having nausea throughout shift. Unable to tolerate clears. MD Stechschulte made aware.

## 2022-03-04 NOTE — Consult Note (Signed)
Reason for Consult/Chief Complaint: s/p B inguinal hernia repair Consultant: Sunnie Nielsen, MD  Matthew [name] is an 66 y.o. male.   HPI: 28M s/p lap BIHR with mesh by Dr. Derrell Lolling 11/2. H/o sz d/o s/p seizure after missing a dose of his lamictal due to PONV. N/v has resolved.   Past Medical History:  Diagnosis Date   Anxiety    on meds   AVM (arteriovenous malformation) of colon 02/04/2021   Bipolar I disorder, most recent episode (or current) mixed, moderate    GERD (gastroesophageal reflux disease)    Hypertension    on meds   Migraines    OSA (obstructive sleep apnea)    retested-bipap not needed   Panic attacks    Seizures (HCC)    last known 05/2020;   Substance abuse (HCC)     Past Surgical History:  Procedure Laterality Date   CARPOMETACARPEL (CMC) FUSION OF THUMB WITH AUTOGRAFT FROM RADIUS Bilateral    COLONOSCOPY  2011   CG-in pt at [facility]-   FINGER ARTHROPLASTY Left 06/17/2012   Procedure: FINGER ARTHROPLASTY;  Surgeon: Jodi Marble, MD;  Location: [facility];  Service: Orthopedics;  Laterality: Left;  left thumb suspension arthroplasty    HEMORRHOID SURGERY  2000   Dr Earlene Plater   TONSILLECTOMY     WISDOM TOOTH EXTRACTION      Family History  Problem Relation Age of Onset   Depression Mother    Hypertension Father    Cancer Father 58       brain tumor   Heart disease Paternal Grandmother    Arthritis Other    Hypertension Other    Colon polyps Neg Hx    Colon cancer Neg Hx    Esophageal cancer Neg Hx    Rectal cancer Neg Hx    Stomach cancer Neg Hx     Social History:  reports that he has quit smoking. His smoking use included pipe and cigars. He has never used smokeless tobacco. He reports that he does not drink alcohol and does not use drugs.  Allergies:  Allergies  Allergen Reactions   Depakote [Divalproex Sodium] Nausea And Vomiting   Seroquel [Quetiapine Fumarate] Nausea And Vomiting   Sumatriptan Other (See Comments)      dizziness   Tramadol     Severe dizziness, restless leg, jittery    Medications: I have reviewed the patient's current medications.  Results for orders placed or performed during the hospital encounter of 03/03/22 (from the past 48 hour(s))  CBC     Status: Abnormal   Collection Time: 03/03/22 12:03 PM  Result Value Ref Range   WBC 14.7 (H) 4.0 - 10.5 K/uL   RBC 4.90 4.22 - 5.81 MIL/uL   Hemoglobin 14.8 13.0 - 17.0 g/dL   HCT 25.4 27.0 - 62.3 %   MCV 85.5 80.0 - 100.0 fL   MCH 30.2 26.0 - 34.0 pg   MCHC 35.3 30.0 - 36.0 g/dL   RDW 76.2 83.1 - 51.7 %   Platelets 268 150 - 400 K/uL   nRBC 0.0 0.0 - 0.2 %    Comment: Performed at [facility], [address] [place], [place] [address]  Comprehensive metabolic panel     Status: Abnormal   Collection Time: 03/03/22 12:03 PM  Result Value Ref Range   Sodium 135 135 - 145 mmol/L   Potassium 3.1 (L) 3.5 - 5.1 mmol/L   Chloride 103 98 - 111 mmol/L  CO2 22 22 - 32 mmol/L   Glucose, Bld 137 (H) 70 - 99 mg/dL    Comment: Glucose reference range applies only to samples taken after fasting for at least 8 hours.   BUN 22 8 - 23 mg/dL   Creatinine, Ser 2.83 0.61 - 1.24 mg/dL   Calcium 9.1 8.9 - 66.2 mg/dL   Total Protein 7.3 6.5 - 8.1 g/dL   Albumin 4.3 3.5 - 5.0 g/dL   AST 20 15 - 41 U/L   ALT 12 0 - 44 U/L   Alkaline Phosphatase 52 38 - 126 U/L   Total Bilirubin 1.0 0.3 - 1.2 mg/dL   GFR, Estimated >94 >76 mL/min    Comment: (NOTE) Calculated using the CKD-EPI Creatinine Equation (2021)    Anion gap 10 5 - 15    Comment: Performed at [facility], [address] [place], [place] [address]  Lactic acid, plasma     Status: None   Collection Time: 03/03/22 12:03 PM  Result Value Ref Range   Lactic Acid, Venous 1.5 0.5 - 1.9 mmol/L    Comment: Performed at [facility], [address] [place], [place] [address]  Ammonia     Status: None   Collection Time: 03/03/22 12:03 PM  Result Value Ref  Range   Ammonia 21 9 - 35 umol/L    Comment: Performed at [facility], [address] [place], [place] [address]  Urinalysis, Routine w reflex microscopic Urine, Clean Catch     Status: Abnormal   Collection Time: 03/03/22  2:10 PM  Result Value Ref Range   Color, Urine YELLOW YELLOW   APPearance CLEAR CLEAR   Specific Gravity, Urine 1.015 1.005 - 1.030   pH 7.0 5.0 - 8.0   Glucose, UA NEGATIVE NEGATIVE mg/dL   Hgb urine dipstick MODERATE (A) NEGATIVE   Bilirubin Urine NEGATIVE NEGATIVE   Ketones, ur 40 (A) NEGATIVE mg/dL   Protein, ur NEGATIVE NEGATIVE mg/dL   Nitrite NEGATIVE NEGATIVE   Leukocytes,Ua NEGATIVE NEGATIVE    Comment: Performed at [facility], [address] [place], [place] [address]  Rapid urine drug screen (hospital performed)     Status: Abnormal   Collection Time: 03/03/22  2:10 PM  Result Value Ref Range   Opiates POSITIVE (A) NONE DETECTED   Cocaine NONE DETECTED NONE DETECTED   Benzodiazepines NONE DETECTED NONE DETECTED   Amphetamines NONE DETECTED NONE DETECTED   Tetrahydrocannabinol POSITIVE (A) NONE DETECTED   Barbiturates NONE DETECTED NONE DETECTED    Comment: (NOTE) DRUG SCREEN FOR MEDICAL PURPOSES ONLY.  IF CONFIRMATION IS NEEDED FOR ANY PURPOSE, NOTIFY LAB WITHIN 5 DAYS.  LOWEST DETECTABLE LIMITS FOR URINE DRUG SCREEN Drug Class                     Cutoff (ng/mL) Amphetamine and metabolites    1000 Barbiturate and metabolites    200 Benzodiazepine                 200 Opiates and metabolites        300 Cocaine and metabolites        300 THC                            50 Performed at [facility], [address] [place], [place] [address]   Urinalysis, Microscopic (reflex)  Status: Abnormal   Collection Time: 03/03/22  2:10 PM  Result Value Ref Range   RBC / HPF 0-5 0 - 5 RBC/hpf   WBC, UA 0-5 0 - 5 WBC/hpf   Bacteria, UA RARE (A) NONE SEEN   Squamous Epithelial / LPF 0-5 0 - 5    Comment:  Performed at [facility], [address] [place], [place] [address]  CBC     Status: Abnormal   Collection Time: 03/03/22  7:36 PM  Result Value Ref Range   WBC 16.3 (H) 4.0 - 10.5 K/uL   RBC 4.85 4.22 - 5.81 MIL/uL   Hemoglobin 14.9 13.0 - 17.0 g/dL   HCT 60.441.6 54.039.0 - 98.152.0 %   MCV 85.8 80.0 - 100.0 fL   MCH 30.7 26.0 - 34.0 pg   MCHC 35.8 30.0 - 36.0 g/dL   RDW 19.112.1 47.811.5 - 29.515.5 %   Platelets 261 150 - 400 K/uL   nRBC 0.0 0.0 - 0.2 %    Comment: Performed at [facility], [address] [place], [place] [address]  Creatinine, serum     Status: None   Collection Time: 03/03/22  7:36 PM  Result Value Ref Range   Creatinine, Ser 0.92 0.61 - 1.24 mg/dL   GFR, Estimated >86>60 >57>60 mL/min    Comment: (NOTE) Calculated using the CKD-EPI Creatinine Equation (2021) Performed at [facility], [address] [place], [place] [address]     CT ABDOMEN PELVIS W CONTRAST  Addendum Date: 03/03/2022   ADDENDUM REPORT: 03/03/2022 14:56 ADDENDUM: The original report was by Dr. Gaylyn RongWalter Liebkemann. The following addendum is by Dr. Gaylyn RongWalter Liebkemann: Critical Value/emergent results were called by telephone at the time of interpretation on 03/03/2022 at 2:44 pm to provider [facility] , who verbally acknowledged these results. Electronically Signed   By: Gaylyn RongWalter  Liebkemann M.D.   On: 03/03/2022 14:56   Result Date: 03/03/2022 CLINICAL DATA:  Postop day 1 status post bilateral hernia surgery. EXAM: CT ABDOMEN AND PELVIS WITH CONTRAST TECHNIQUE: Multidetector CT imaging of the abdomen and pelvis was performed using the standard protocol following bolus administration of intravenous contrast. RADIATION DOSE REDUCTION: This exam was performed according to the departmental dose-optimization program which includes automated exposure control, adjustment of the mA and/or kV according to patient size and/or use of iterative reconstruction technique. CONTRAST:  100mL OMNIPAQUE IOHEXOL 300  MG/ML  SOLN COMPARISON:  11/19/2021 CT scan FINDINGS: Lower chest: Pneumomediastinum with gas tracking anterior to the pericardium. There is also some gas tracking along the soft tissues of the thoracoabdominal wall and along the margins of the rib cage, right greater than left. Mild atelectasis in both lower lobes and in the lingula. No pleural effusion. Mild cardiomegaly noted. Small amount of pneumomediastinum tracks adjacent to the distal esophagus. No definite leak of the distal esophagus identified. Hepatobiliary: Unremarkable Pancreas: Unremarkable Spleen: Unremarkable Adrenals/Urinary Tract: Small Bosniak category 1 right mid kidney cyst on image 33 series 2. Other small hypodense lesions of both kidneys are technically too small to characterize but statistically highly likely to represent cysts and do not require independent imaging follow up. Stomach/Bowel: There is substantial pneumoperitoneum, especially in the upper abdomen anterior to the liver, with extensive scattered small loculations of gas and some larger collections of anterior gas. Some of this gas is along the margins of the stomach and some along the margins of the liver and spleen, along the right omentum and left paracolic gutter,  and scattered in the soft tissues of abdominal wall bilaterally. There is also some minimal trace retroperitoneal gas bilaterally. Some of the ileum tracks immediately adjacent to the right groin hernia repair as on images 68 through 73 of series 2, although a definite perforation in this vicinity is not identified. The appendix appears normal. A specific location of perforation is not identified. No definite pneumatosis or portal venous gas. Vascular/Lymphatic: Patent mesenteric vessels, no filling defect observed in the superior mesenteric artery. Small retroperitoneal lymph nodes are likely reactive. Reproductive: Upper normal sized prostate gland. Bilateral groin hernia repairs with typical edema pattern along  the inguinal ring bilaterally, along with some edema tracking along both spermatic cords and a small amount of gas tracking along both spermatic cords down towards the scrotum. There is also a small amount of gas tracking in the soft tissues the pubis and along the margins of the penis for example on image 95 series 2. Small locules of gas track in the inguinal region and adjacent to the common femoral vessels. Other: In addition to the above scattered widespread and tonic locations of gas, there is gas tracking along the distal iliopsoas musculature towards the lesser trochanters of the hips bilaterally. Musculoskeletal: Postoperative findings from recent groin hernia repair. Clips are noted anterior to the urinary bladder and along the lower linea alba and right rectus abdominus muscle. IMPRESSION: 1. Postop day 1 status post groin hernia repairs. There is considerable pneumoperitoneum and substantial gas tracking along the abdominal wall and along the groin bilaterally, mild pneumoretroperitoneum, gas tracking along the lower chest wall, and pneumomediastinum tracking adjacent to the distal esophagus and anterior to the heart. Based on today's chest radiograph there appears to be gas tracking along the right upper mediastinum and in the right neck. This is substantially more gas than I would expect postoperative day 1 for bilateral groin hernia repair, raising the possibility of issue related to abdominal insufflation if the surgery was laparoscopic, versus a otherwise occult hollow viscus perforation. The widespread distribution into various body compartments would be somewhat unusual for a perforated viscus which is more commonly contained to the peritoneal cavity. Correlate with prior operative note if available. 2. No direct compelling evidence of bowel ischemia; part of the distal small bowel abuts the right groin hernia repair but I do not observe herniated or strangulated bowel in this vicinity. 3. Mild  cardiomegaly. 4. Mild atelectasis in both lower lobes. Radiology assistant personnel have been notified to put me in telephone contact with the referring physician or the referring physician's clinical representative in order to discuss these findings. Once this communication is established I will issue an addendum to this report for documentation purposes. Electronically Signed: By: Gaylyn Rong M.D. On: 03/03/2022 14:39   CT Head Wo Contrast  Result Date: 03/03/2022 CLINICAL DATA:  Fever and confusion after hernia surgery. EXAM: CT HEAD WITHOUT CONTRAST TECHNIQUE: Contiguous axial images were obtained from the base of the skull through the vertex without intravenous contrast. RADIATION DOSE REDUCTION: This exam was performed according to the departmental dose-optimization program which includes automated exposure control, adjustment of the mA and/or kV according to patient size and/or use of iterative reconstruction technique. COMPARISON:  CT head 10/30/2021 FINDINGS: Image quality is degraded by motion artifact. Brain: There is no evidence of acute intracranial hemorrhage, extra-axial fluid collection, or acute infarct. Parenchymal volume is normal. The ventricles are normal in size. Gray-white differentiation is preserved. There is no mass lesion.  There is no mass  effect or midline shift. Vascular: No hyperdense vessel or unexpected calcification. Skull: Normal. Negative for fracture or focal lesion. Sinuses/Orbits: The paranasal sinuses are clear. The globes and orbits are unremarkable. Other: None. IMPRESSION: Motion degraded study. Within this confine, no definite evidence of acute intracranial pathology. Electronically Signed   By: Valetta Mole M.D.   On: 03/03/2022 14:03   DG Chest 2 View  Result Date: 03/03/2022 CLINICAL DATA:  Rule out postop pneumonia EXAM: CHEST - 2 VIEW COMPARISON:  Chest x-ray dated November 03, 2021 FINDINGS: Cardiac and mediastinal contours within normal limits for AP  technique. Mild bibasilar opacities, likely due to atelectasis. Pneumoperitoneum. No pleural effusion. No evidence of pneumothorax. Small amount of soft tissue gas seen at the right neck base. IMPRESSION: 1. Mild bibasilar opacities, likely due to atelectasis. 2. Pneumoperitoneum, likely postoperative. Electronically Signed   By: Yetta Glassman M.D.   On: 03/03/2022 14:01    ROS 10 point review of systems is negative except as listed above in HPI.   Physical Exam Blood pressure 125/77, pulse 78, temperature 98.3 F (36.8 C), resp. rate 17, height 5\' 10"  (1.778 m), weight 67.5 kg, SpO2 97 %. Constitutional: well-developed, well-nourished HEENT: pupils equal, round, reactive to light, 78mm b/l, moist conjunctiva, external inspection of ears and nose normal, hearing intact Oropharynx: normal oropharyngeal mucosa, normal dentition Neck: no thyromegaly, trachea midline, no midline cervical tenderness to palpation Chest: breath sounds equal bilaterally, normal respiratory effort, no midline or lateral chest wall tenderness to palpation/deformity Abdomen: soft, appropriately TTP, mild bruising-expected, no bruising, no hepatosplenomegaly GU: no blood at urethral meatus of penis, no scrotal masses or abnormality, B groins appropriately tender without palpable hernia Back: no wounds, no thoracic/lumbar spine tenderness to palpation, no thoracic/lumbar spine stepoffs Rectal: deferred Extremities: 2+ radial and pedal pulses bilaterally, intact motor and sensation bilateral UE and LE, no peripheral edema MSK: unable to assess gait/station, no clubbing/cyanosis of fingers/toes, normal ROM of all four extremities Skin: warm, dry, no rashes Psych: normal memory, normal mood/affect     Assessment/Plan: 21M with PONV after lap BIHR with mesh who missed his scheduled AED and had a seizure. CT imaging reviewed, unable to view surgeon op note. Unclear is procedure herniorrhaphy was TAP or TEP, but SQE on CT is  an expected amount. Pneumoperitoneum could be c/w expected amount if TAP repair performed or violation of peritoneum during TEP. I do not clinically suspect a bowel injury and have no objection to regular diet as long as n/v has resolved, which patient states it has. May use anti-emetics of choice prn per primary team recommendations.    Jesusita Oka, MD General and [facility]

## 2022-03-04 NOTE — Progress Notes (Signed)
Consultation Progress Note   Patient: Matthew [name] JKD:[id number] DOB: Apr 26, 1956 DOA: 03/03/2022 DOS: the patient was seen and examined on 03/04/2022 Primary service: DibiaManfred Shirts, MD  Brief hospital course:  66 y.o. male with medical history significant of essential hypertension, GERD, obstructive sleep apnea, seizure disorder, anxiety disorder, history of AVMs, bipolar 1 disorder who has been off his lamotrigine for briefly for his seizures.  He normally takes them twice a day.  Patient presented to [facility] with concern for fever abdominal pain and altered mental status  Assessment and Plan: Pneumoperitoneum: Suspected postop changes.  Surgery cleared patient for diet advancement. If he tolerates solids he can be discharged.     breakthrough seizure: Patient was being evaluated by neurology.  He has been started back on his lamictal.   hypokalemia: Replete potassium.    postoperative day #2 status post bilateral inguinal hernia repair: Continue per general surgery   bipolar disorder: Stable.  Continue to monitor.   essential hypertension: Continue blood pressure medications.   obstructive sleep apnea: CPAP as needed.         TRH will continue to follow the patient.  Subjective: No complaints, yet to try his clear diet.  Physical Exam:  Constitutional: NAD, calm, comfortable Eyes: PERRL, lids and conjunctivae normal ENMT: Mucous membranes are moist. Posterior pharynx clear of any exudate or lesions.Normal dentition.  Neck: normal, supple, no masses, no thyromegaly Respiratory: clear to auscultation bilaterally, no wheezing, no crackles. Normal respiratory effort. No accessory muscle use.  Cardiovascular: Regular rate and rhythm, no murmurs / rubs / gallops. No extremity edema. 2+ pedal pulses. No carotid bruits.  Abdomen: Soft, non tender, Periumblical wound c/d/I,  no masses palpated. No hepatosplenomegaly. Bowel sounds positive.  Musculoskeletal:  Good range of motion, no joint swelling or tenderness, Skin: no rashes, lesions, ulcers. No induration Neurologic: CN 2-12 grossly intact. Sensation intact, DTR normal. Strength 5/5 in all 4.  Psychiatric: Normal judgment and insight. Alert and oriented x 3. Normal mood Vitals:   03/04/22 0003 03/04/22 0412 03/04/22 0730 03/04/22 1234  BP: 125/77 (!) 140/90 (!) 145/89 (!) 160/81  Pulse: 78 76 69   Resp: 17 19 15    Temp: 98.3 F (36.8 C) 98.2 F (36.8 C) 98.2 F (36.8 C)   TempSrc:   Oral   SpO2: 97% 99% 96%   Weight:      Height:        Data Reviewed:  There are no new results to review at this time.  Family Communication:   Time spent: 15 minutes.  Author: Cristela Felt, MD 03/04/2022 12:55 PM  For on call review [email or web address].

## 2022-03-05 DIAGNOSIS — K567 Ileus, unspecified: Secondary | ICD-10-CM | POA: Diagnosis present

## 2022-03-05 DIAGNOSIS — I160 Hypertensive urgency: Secondary | ICD-10-CM | POA: Diagnosis present

## 2022-03-05 DIAGNOSIS — Z87891 Personal history of nicotine dependence: Secondary | ICD-10-CM | POA: Diagnosis not present

## 2022-03-05 DIAGNOSIS — E875 Hyperkalemia: Secondary | ICD-10-CM | POA: Diagnosis not present

## 2022-03-05 DIAGNOSIS — G4733 Obstructive sleep apnea (adult) (pediatric): Secondary | ICD-10-CM | POA: Diagnosis present

## 2022-03-05 DIAGNOSIS — Z885 Allergy status to narcotic agent status: Secondary | ICD-10-CM | POA: Diagnosis not present

## 2022-03-05 DIAGNOSIS — Z888 Allergy status to other drugs, medicaments and biological substances status: Secondary | ICD-10-CM | POA: Diagnosis not present

## 2022-03-05 DIAGNOSIS — Y838 Other surgical procedures as the cause of abnormal reaction of the patient, or of later complication, without mention of misadventure at the time of the procedure: Secondary | ICD-10-CM | POA: Diagnosis present

## 2022-03-05 DIAGNOSIS — K9189 Other postprocedural complications and disorders of digestive system: Secondary | ICD-10-CM | POA: Diagnosis present

## 2022-03-05 DIAGNOSIS — R112 Nausea with vomiting, unspecified: Secondary | ICD-10-CM | POA: Diagnosis not present

## 2022-03-05 DIAGNOSIS — G40909 Epilepsy, unspecified, not intractable, without status epilepticus: Secondary | ICD-10-CM | POA: Diagnosis present

## 2022-03-05 DIAGNOSIS — Z79899 Other long term (current) drug therapy: Secondary | ICD-10-CM | POA: Diagnosis not present

## 2022-03-05 DIAGNOSIS — I10 Essential (primary) hypertension: Secondary | ICD-10-CM | POA: Diagnosis present

## 2022-03-05 DIAGNOSIS — R569 Unspecified convulsions: Secondary | ICD-10-CM | POA: Diagnosis not present

## 2022-03-05 DIAGNOSIS — Z8249 Family history of ischemic heart disease and other diseases of the circulatory system: Secondary | ICD-10-CM | POA: Diagnosis not present

## 2022-03-05 DIAGNOSIS — K4020 Bilateral inguinal hernia, without obstruction or gangrene, not specified as recurrent: Secondary | ICD-10-CM | POA: Diagnosis present

## 2022-03-05 DIAGNOSIS — F410 Panic disorder [episodic paroxysmal anxiety] without agoraphobia: Secondary | ICD-10-CM | POA: Diagnosis present

## 2022-03-05 DIAGNOSIS — K668 Other specified disorders of peritoneum: Secondary | ICD-10-CM | POA: Diagnosis not present

## 2022-03-05 DIAGNOSIS — G8918 Other acute postprocedural pain: Secondary | ICD-10-CM | POA: Diagnosis not present

## 2022-03-05 DIAGNOSIS — R1110 Vomiting, unspecified: Secondary | ICD-10-CM | POA: Diagnosis present

## 2022-03-05 DIAGNOSIS — G43919 Migraine, unspecified, intractable, without status migrainosus: Secondary | ICD-10-CM | POA: Diagnosis present

## 2022-03-05 DIAGNOSIS — K219 Gastro-esophageal reflux disease without esophagitis: Secondary | ICD-10-CM | POA: Diagnosis present

## 2022-03-05 DIAGNOSIS — E876 Hypokalemia: Secondary | ICD-10-CM | POA: Diagnosis present

## 2022-03-05 DIAGNOSIS — F319 Bipolar disorder, unspecified: Secondary | ICD-10-CM | POA: Diagnosis present

## 2022-03-05 MED ORDER — KETOROLAC TROMETHAMINE 15 MG/ML IJ SOLN
INTRAMUSCULAR | Status: AC
  Administered 2022-03-05: 15 mg via INTRAVENOUS
  Filled 2022-03-05: qty 1

## 2022-03-05 MED ORDER — SUMATRIPTAN 20 MG/ACT NA SOLN: 20.0000 mg | NASAL | Status: DC | PRN

## 2022-03-05 MED ORDER — OXYCODONE HCL 5 MG PO TABS
2.5000 mg | ORAL_TABLET | ORAL | Status: DC | PRN
  Administered 2022-03-05 – 2022-03-06 (×3): 5 mg via ORAL
  Filled 2022-03-05 (×4): qty 1

## 2022-03-05 MED ORDER — ACETAMINOPHEN 325 MG PO TABS
650.0000 mg | ORAL_TABLET | Freq: Four times a day (QID) | ORAL | Status: DC
  Administered 2022-03-05 – 2022-03-09 (×17): 650 mg via ORAL
  Filled 2022-03-05 (×17): qty 2

## 2022-03-05 MED ORDER — METOCLOPRAMIDE HCL 5 MG/ML IJ SOLN
5.0000 mg | Freq: Four times a day (QID) | INTRAMUSCULAR | Status: DC
  Administered 2022-03-05 – 2022-03-07 (×8): 5 mg via INTRAVENOUS
  Filled 2022-03-05 (×8): qty 2

## 2022-03-05 MED ORDER — HYDROMORPHONE HCL 1 MG/ML IJ SOLN
1.0000 mg | INTRAMUSCULAR | Status: DC | PRN
  Administered 2022-03-06 – 2022-03-08 (×11): 1 mg via INTRAVENOUS
  Filled 2022-03-05 (×11): qty 1

## 2022-03-05 MED ORDER — DIPHENHYDRAMINE HCL 50 MG/ML IJ SOLN
12.5000 mg | INTRAMUSCULAR | Status: AC
  Administered 2022-03-05: 12.5 mg via INTRAVENOUS
  Filled 2022-03-05: qty 1

## 2022-03-05 MED ORDER — METOCLOPRAMIDE HCL 5 MG/ML IJ SOLN
5.0000 mg | INTRAMUSCULAR | Status: AC
  Administered 2022-03-05: 5 mg via INTRAVENOUS
  Filled 2022-03-05: qty 2

## 2022-03-05 MED ORDER — BISACODYL 10 MG RE SUPP: 10.0000 mg | Freq: Every day | RECTAL | Status: DC | PRN

## 2022-03-05 MED ORDER — HYDRALAZINE HCL 20 MG/ML IJ SOLN
5.0000 mg | Freq: Four times a day (QID) | INTRAMUSCULAR | Status: DC | PRN
  Administered 2022-03-05 – 2022-03-06 (×2): 5 mg via INTRAVENOUS
  Filled 2022-03-05 (×2): qty 1

## 2022-03-05 MED ORDER — POLYETHYLENE GLYCOL 3350 17 G PO PACK
17.0000 g | PACK | Freq: Every day | ORAL | Status: DC
  Administered 2022-03-05 – 2022-03-08 (×4): 17 g via ORAL
  Filled 2022-03-05 (×4): qty 1

## 2022-03-05 MED ORDER — KETOROLAC TROMETHAMINE 15 MG/ML IJ SOLN: 15.0000 mg | INTRAMUSCULAR | Status: AC

## 2022-03-05 MED ORDER — METHOCARBAMOL 500 MG PO TABS
500.0000 mg | ORAL_TABLET | Freq: Four times a day (QID) | ORAL | Status: DC
  Administered 2022-03-05 – 2022-03-09 (×17): 500 mg via ORAL
  Filled 2022-03-05 (×17): qty 1

## 2022-03-05 MED ORDER — AMLODIPINE BESYLATE 10 MG PO TABS: 10.0000 mg | ORAL_TABLET | Freq: Every day | ORAL | Status: DC

## 2022-03-05 NOTE — Progress Notes (Signed)
[facility] Progress Note     Subjective: CC-  Wife at bedside. Patient continues to have nausea. Received reglan this morning and nausea is somewhat improved. No emesis since admission. Passing small amount of flatus, last BM Wednesday.  Abdomen sore at incisions, worse when he sits up and better at rest. Continues to have pain proximally, taking dilaudid. EEG negative for seizure. Patient states he did not miss his seizure medication prior to surgery, and has documentation showing this.   Objective: Vital signs in last 24 hours: Temp:  [97.6 F (36.4 C)-98.8 F (37.1 C)] 97.6 F (36.4 C) (11/05 0933) Pulse Rate:  [62-73] 70 (11/05 0933) Resp:  [17-19] 17 (11/05 0542) BP: (119-170)/(71-103) 170/103 (11/05 0933) SpO2:  [98 %-100 %] 98 % (11/05 0933) Last BM Date : 03/03/22  Intake/Output from previous day: 11/04 0701 - 11/05 0700 In: 2500.8 [P.O.:600; I.V.:1900.8] Out: 2175 [Urine:2175] Intake/Output this shift: No intake/output data recorded.  PE: Gen:  Alert, NAD Abd: soft, nondistended, +bowel sounds, appropriately tender at incisions, mild bruising around incisions but no cellulitis or purulent drainage  Lab Results:  Recent Labs    03/03/22 1936 03/04/22 0307  WBC 16.3* 13.9*  HGB 14.9 13.0  HCT 41.6 37.0*  PLT 261 228   BMET Recent Labs    03/03/22 1203 03/03/22 1936 03/04/22 0307  NA 135  --  140  K 3.1*  --  3.7  CL 103  --  111  CO2 22  --  20*  GLUCOSE 137*  --  117*  BUN 22  --  16  CREATININE 0.87 0.92 0.80  CALCIUM 9.1  --  9.1   PT/INR No results for input(s): "LABPROT", "INR" in the last 72 hours. CMP     Component Value Date/Time   NA 140 03/04/2022 0307   NA 141 02/10/2021 1040   K 3.7 03/04/2022 0307   CL 111 03/04/2022 0307   CO2 20 (L) 03/04/2022 0307   GLUCOSE 117 (H) 03/04/2022 0307   GLUCOSE 144 (H) 05/11/2006 0818   BUN 16 03/04/2022 0307   BUN 13 02/10/2021 1040   CREATININE 0.80 03/04/2022 0307   CALCIUM  9.1 03/04/2022 0307   PROT 5.7 (L) 03/04/2022 0307   PROT 6.3 02/10/2021 1040   ALBUMIN 3.5 03/04/2022 0307   ALBUMIN 4.5 02/10/2021 1040   AST 14 (L) 03/04/2022 0307   ALT 10 03/04/2022 0307   ALKPHOS 40 03/04/2022 0307   BILITOT 1.0 03/04/2022 0307   BILITOT 0.4 02/10/2021 1040   GFRNONAA >60 03/04/2022 0307   GFRAA  06/09/2009 1707    >60        The eGFR has been calculated using the MDRD equation. This calculation has not been validated in all clinical situations. eGFR's persistently <60 mL/min signify possible Chronic Kidney Disease.   Lipase     Component Value Date/Time   LIPASE 45.0 07/27/2008 0824       Studies/Results: CT ABDOMEN PELVIS W CONTRAST  Addendum Date: 03/03/2022   ADDENDUM REPORT: 03/03/2022 14:56 ADDENDUM: The original report was by Dr. Van Clines. The following addendum is by Dr. Van Clines: Critical Value/emergent results were called by telephone at the time of interpretation on 03/03/2022 at 2:44 pm to provider [facility] , who verbally acknowledged these results. Electronically Signed   By: Van Clines M.D.   On: 03/03/2022 14:56   Result Date: 03/03/2022 CLINICAL DATA:  Postop day 1 status post bilateral hernia surgery. EXAM: CT ABDOMEN AND  PELVIS WITH CONTRAST TECHNIQUE: Multidetector CT imaging of the abdomen and pelvis was performed using the standard protocol following bolus administration of intravenous contrast. RADIATION DOSE REDUCTION: This exam was performed according to the departmental dose-optimization program which includes automated exposure control, adjustment of the mA and/or kV according to patient size and/or use of iterative reconstruction technique. CONTRAST:  166m OMNIPAQUE IOHEXOL 300 MG/ML  SOLN COMPARISON:  11/19/2021 CT scan FINDINGS: Lower chest: Pneumomediastinum with gas tracking anterior to the pericardium. There is also some gas tracking along the soft tissues of the thoracoabdominal wall and  along the margins of the rib cage, right greater than left. Mild atelectasis in both lower lobes and in the lingula. No pleural effusion. Mild cardiomegaly noted. Small amount of pneumomediastinum tracks adjacent to the distal esophagus. No definite leak of the distal esophagus identified. Hepatobiliary: Unremarkable Pancreas: Unremarkable Spleen: Unremarkable Adrenals/Urinary Tract: Small Bosniak category 1 right mid kidney cyst on image 33 series 2. Other small hypodense lesions of both kidneys are technically too small to characterize but statistically highly likely to represent cysts and do not require independent imaging follow up. Stomach/Bowel: There is substantial pneumoperitoneum, especially in the upper abdomen anterior to the liver, with extensive scattered small loculations of gas and some larger collections of anterior gas. Some of this gas is along the margins of the stomach and some along the margins of the liver and spleen, along the right omentum and left paracolic gutter, and scattered in the soft tissues of abdominal wall bilaterally. There is also some minimal trace retroperitoneal gas bilaterally. Some of the ileum tracks immediately adjacent to the right groin hernia repair as on images 68 through 73 of series 2, although a definite perforation in this vicinity is not identified. The appendix appears normal. A specific location of perforation is not identified. No definite pneumatosis or portal venous gas. Vascular/Lymphatic: Patent mesenteric vessels, no filling defect observed in the superior mesenteric artery. Small retroperitoneal lymph nodes are likely reactive. Reproductive: Upper normal sized prostate gland. Bilateral groin hernia repairs with typical edema pattern along the inguinal ring bilaterally, along with some edema tracking along both spermatic cords and a small amount of gas tracking along both spermatic cords down towards the scrotum. There is also a small amount of gas  tracking in the soft tissues the pubis and along the margins of the penis for example on image 95 series 2. Small locules of gas track in the inguinal region and adjacent to the common femoral vessels. Other: In addition to the above scattered widespread and tonic locations of gas, there is gas tracking along the distal iliopsoas musculature towards the lesser trochanters of the hips bilaterally. Musculoskeletal: Postoperative findings from recent groin hernia repair. Clips are noted anterior to the urinary bladder and along the lower linea alba and right rectus abdominus muscle. IMPRESSION: 1. Postop day 1 status post groin hernia repairs. There is considerable pneumoperitoneum and substantial gas tracking along the abdominal wall and along the groin bilaterally, mild pneumoretroperitoneum, gas tracking along the lower chest wall, and pneumomediastinum tracking adjacent to the distal esophagus and anterior to the heart. Based on today's chest radiograph there appears to be gas tracking along the right upper mediastinum and in the right neck. This is substantially more gas than I would expect postoperative day 1 for bilateral groin hernia repair, raising the possibility of issue related to abdominal insufflation if the surgery was laparoscopic, versus a otherwise occult hollow viscus perforation. The widespread distribution into various body  compartments would be somewhat unusual for a perforated viscus which is more commonly contained to the peritoneal cavity. Correlate with prior operative note if available. 2. No direct compelling evidence of bowel ischemia; part of the distal small bowel abuts the right groin hernia repair but I do not observe herniated or strangulated bowel in this vicinity. 3. Mild cardiomegaly. 4. Mild atelectasis in both lower lobes. Radiology assistant personnel have been notified to put me in telephone contact with the referring physician or the referring physician's clinical  representative in order to discuss these findings. Once this communication is established I will issue an addendum to this report for documentation purposes. Electronically Signed: By: Van Clines M.D. On: 03/03/2022 14:39   CT Head Wo Contrast  Result Date: 03/03/2022 CLINICAL DATA:  Fever and confusion after hernia surgery. EXAM: CT HEAD WITHOUT CONTRAST TECHNIQUE: Contiguous axial images were obtained from the base of the skull through the vertex without intravenous contrast. RADIATION DOSE REDUCTION: This exam was performed according to the departmental dose-optimization program which includes automated exposure control, adjustment of the mA and/or kV according to patient size and/or use of iterative reconstruction technique. COMPARISON:  CT head 10/30/2021 FINDINGS: Image quality is degraded by motion artifact. Brain: There is no evidence of acute intracranial hemorrhage, extra-axial fluid collection, or acute infarct. Parenchymal volume is normal. The ventricles are normal in size. Gray-white differentiation is preserved. There is no mass lesion.  There is no mass effect or midline shift. Vascular: No hyperdense vessel or unexpected calcification. Skull: Normal. Negative for fracture or focal lesion. Sinuses/Orbits: The paranasal sinuses are clear. The globes and orbits are unremarkable. Other: None. IMPRESSION: Motion degraded study. Within this confine, no definite evidence of acute intracranial pathology. Electronically Signed   By: Valetta Mole M.D.   On: 03/03/2022 14:03   DG Chest 2 View  Result Date: 03/03/2022 CLINICAL DATA:  Rule out postop pneumonia EXAM: CHEST - 2 VIEW COMPARISON:  Chest x-ray dated November 03, 2021 FINDINGS: Cardiac and mediastinal contours within normal limits for AP technique. Mild bibasilar opacities, likely due to atelectasis. Pneumoperitoneum. No pleural effusion. No evidence of pneumothorax. Small amount of soft tissue gas seen at the right neck base. IMPRESSION:  1. Mild bibasilar opacities, likely due to atelectasis. 2. Pneumoperitoneum, likely postoperative. Electronically Signed   By: Yetta Glassman M.D.   On: 03/03/2022 14:01    Anti-infectives: Anti-infectives (From admission, onward)    None        Assessment/Plan Readmit with Nausea/ vomiting Concern for breakthrough seizure - neurology has seen, EEG negative  POD#3 s/p lap BIHR with mesh by Dr. Rosendo Gros 11/2  - CT reviewed with MD, free air is an expected amount postoperatively. Abdominal exam benign, no concern for perforated viscus - Ongoing nausea, no emesis. Will schedule reglan as this seemed to help this morning. Increase bowel regimen (miralax, with suppository PRN if no results). Schedule tylenol and robaxin to limit narcotic needs in case this is factoring into nausea. Mobilize. FLD, ADAT.  ID - none FEN - IVF, FLD VTE - lovenox Foley - none    LOS: 0 days    Wellington Hampshire, [facility] 03/05/2022, 11:11 AM Please see Amion for pager number during day hours 7:00am-4:30pm

## 2022-03-05 NOTE — Progress Notes (Signed)
Consultation Progress Note   Patient: Matthew [name] WUX:[id number] DOB: [birth year] DOA: 03/03/2022 DOS: the patient was seen and examined on 03/05/2022 Primary service: Bonnell Public, MD  Brief hospital course: Patient is a 66 year old Caucasian male with past medical history significant for hypertension, GERD, obstructive sleep apnea, seizure disorder, anxiety disorder, history of AVMs, bipolar 1 disorder who has been off his lamotrigine for briefly for his seizures.  He normally takes them twice a day.  Patient presented to [place] with concern for fever abdominal pain and altered mental status.  03/05/2022: Patient seen alongside patient's daughter.  Patient maintains that he has been compliant with his lamotrigine.  Patient denies having a seizure.  Altered mentation seems to have improved.  Abdominal pain has resolved.  Patient has broken wind.  Surgical input is appreciated.  Blood pressure is not controlled.  Will increase Norvasc to 10 Mg p.o. once daily.  Assessment and Plan: Pneumoperitoneum: -According to the surgical team, likely secondary to postop changes. -Surgery team is directing care. -Surgery cleared patient for diet advancement. If he tolerates solids he can be discharged.  Postoperative ileus: -Improving. -Patient has broken wind. -Hypokalemia has improved significantly.  Potassium is 3.7 today.   Breakthrough seizure: -Patient was being evaluated by neurology.  He has been started back on his lamictal. 03/05/2022: Patient denies having seizures!   Hypokalemia: -Potassium today is 3.7. -Continue to replete. -Renal panel and magnesium level will be checked in the morning.      postoperative day #2 status post bilateral inguinal hernia repair: Continue per general surgery   bipolar disorder: Stable.  Continue to monitor.   Hypertensive urgency: -Systolic blood pressures in the 170s today. -Increased Norvasc to 10 Mg p.o. once daily (from 5 Mg  p.o. once daily) -Continue Coreg 3.125 Mg p.o. twice daily.     obstructive sleep apnea: CPAP as needed.         TRH will continue to follow the patient.  Subjective: No complaints, yet to try his clear diet.  Physical Exam:  General condition: Awake and alert.  Not in any distress.   HEENT: No jaundice.  Neck: Supple.  No raised JVD.   Respiratory: clear to auscultation bilaterally, no wheezing, no crackles. Normal respiratory effort. No accessory muscle use.  Cardiovascular: O5-D6, soft systolic murmur.  Increased intensity of S2 component of the heart sound.   Abdomen: Soft, non tender.  Bowel sounds are present. Neurologic: Patient is awake and alert.  Patient moves all extremities.    Vitals:   03/04/22 1645 03/04/22 2137 03/05/22 0542 03/05/22 0933  BP: (!) 158/95 (!) 149/97 119/71 (!) 170/103  Pulse: 66 73 62 70  Resp:  19 17   Temp: 98.8 F (37.1 C) 98.5 F (36.9 C) 98.3 F (36.8 C) 97.6 F (36.4 C)  TempSrc: Oral   Temporal  SpO2: 98% 100% 98% 98%  Weight:      Height:        Data Reviewed:  There are no new results to review at this time.  Family Communication:   Time spent: 55 minutes.  Author: Bonnell Public, MD 03/05/2022 4:04 PM  For on call review [email or web address].

## 2022-03-05 NOTE — Plan of Care (Signed)
  Problem: Pain Managment: Goal: General experience of comfort will improve Outcome: Progressing   Problem: Health Behavior/Discharge Planning: Goal: Ability to manage health-related needs will improve Outcome: Progressing   Problem: Education: Goal: Knowledge of General Education information will improve Description: Including pain rating scale, medication(s)/side effects and non-pharmacologic comfort measures Outcome: Progressing

## 2022-03-06 ENCOUNTER — Inpatient Hospital Stay (HOSPITAL_COMMUNITY): Payer: Medicare Other

## 2022-03-06 DIAGNOSIS — K668 Other specified disorders of peritoneum: Secondary | ICD-10-CM | POA: Diagnosis not present

## 2022-03-06 LAB — LAMOTRIGINE LEVEL: Lamotrigine Lvl: 1.6 ug/mL — ABNORMAL LOW (ref 2.0–20.0)

## 2022-03-06 LAB — CBC
HCT: 44.1 % (ref 39.0–52.0)
Hemoglobin: 15.2 g/dL (ref 13.0–17.0)
MCH: 30.1 pg (ref 26.0–34.0)
MCHC: 34.5 g/dL (ref 30.0–36.0)
MCV: 87.3 fL (ref 80.0–100.0)
Platelets: 273 10*3/uL (ref 150–400)
RBC: 5.05 MIL/uL (ref 4.22–5.81)
RDW: 12.1 % (ref 11.5–15.5)
WBC: 6.7 10*3/uL (ref 4.0–10.5)
nRBC: 0 % (ref 0.0–0.2)

## 2022-03-06 LAB — CBC WITH DIFFERENTIAL/PLATELET
Abs Immature Granulocytes: 0.04 10*3/uL (ref 0.00–0.07)
Basophils Absolute: 0.1 10*3/uL (ref 0.0–0.1)
Basophils Relative: 1 %
Eosinophils Absolute: 0.2 10*3/uL (ref 0.0–0.5)
Eosinophils Relative: 2 %
HCT: 40.9 % (ref 39.0–52.0)
Hemoglobin: 14.5 g/dL (ref 13.0–17.0)
Immature Granulocytes: 0 %
Lymphocytes Relative: 22 %
Lymphs Abs: 2.1 10*3/uL (ref 0.7–4.0)
MCH: 30.3 pg (ref 26.0–34.0)
MCHC: 35.5 g/dL (ref 30.0–36.0)
MCV: 85.6 fL (ref 80.0–100.0)
Monocytes Absolute: 0.8 10*3/uL (ref 0.1–1.0)
Monocytes Relative: 9 %
Neutro Abs: 6.2 10*3/uL (ref 1.7–7.7)
Neutrophils Relative %: 66 %
Platelets: 261 10*3/uL (ref 150–400)
RBC: 4.78 MIL/uL (ref 4.22–5.81)
RDW: 11.9 % (ref 11.5–15.5)
WBC: 9.4 10*3/uL (ref 4.0–10.5)
nRBC: 0 % (ref 0.0–0.2)

## 2022-03-06 LAB — RENAL FUNCTION PANEL
Albumin: 3.6 g/dL (ref 3.5–5.0)
Anion gap: 7 (ref 5–15)
BUN: 13 mg/dL (ref 8–23)
CO2: 22 mmol/L (ref 22–32)
Calcium: 9.6 mg/dL (ref 8.9–10.3)
Chloride: 112 mmol/L — ABNORMAL HIGH (ref 98–111)
Creatinine, Ser: 0.92 mg/dL (ref 0.61–1.24)
GFR, Estimated: >60 mL/min (ref >60)
Glucose, Bld: 113 mg/dL — ABNORMAL HIGH (ref 70–99)
Phosphorus: 2.2 mg/dL — ABNORMAL LOW (ref 2.5–4.6)
Potassium: 3.9 mmol/L (ref 3.5–5.1)
Sodium: 141 mmol/L (ref 135–145)

## 2022-03-06 LAB — MAGNESIUM: Magnesium: 1.7 mg/dL (ref 1.7–2.4)

## 2022-03-06 MED ORDER — HYDROXYZINE HCL 10 MG PO TABS
10.0000 mg | ORAL_TABLET | ORAL | Status: DC
  Filled 2022-03-06: qty 1

## 2022-03-06 MED ORDER — ELETRIPTAN HYDROBROMIDE 20 MG PO TABS
20.0000 mg | ORAL_TABLET | Freq: Once | ORAL | Status: AC
  Administered 2022-03-06: 20 mg via ORAL
  Filled 2022-03-06: qty 1

## 2022-03-06 MED ORDER — DEXAMETHASONE SODIUM PHOSPHATE 4 MG/ML IJ SOLN
4.0000 mg | INTRAMUSCULAR | Status: AC
  Administered 2022-03-06: 4 mg via INTRAVENOUS
  Filled 2022-03-06: qty 1

## 2022-03-06 MED ORDER — HYDROXYZINE HCL 10 MG PO TABS
10.0000 mg | ORAL_TABLET | Freq: Once | ORAL | Status: AC | PRN
  Administered 2022-03-06: 10 mg via ORAL

## 2022-03-06 MED ORDER — ZOLPIDEM TARTRATE 5 MG PO TABS
5.0000 mg | ORAL_TABLET | Freq: Every evening | ORAL | Status: DC | PRN
  Administered 2022-03-06 – 2022-03-08 (×3): 5 mg via ORAL
  Filled 2022-03-06 (×3): qty 1

## 2022-03-06 MED ORDER — PROCHLORPERAZINE EDISYLATE 10 MG/2ML IJ SOLN
5.0000 mg | Freq: Four times a day (QID) | INTRAMUSCULAR | Status: DC | PRN
  Administered 2022-03-06 – 2022-03-07 (×2): 5 mg via INTRAVENOUS
  Filled 2022-03-06 (×2): qty 2

## 2022-03-06 MED ORDER — AMLODIPINE BESYLATE 10 MG PO TABS
10.0000 mg | ORAL_TABLET | Freq: Every day | ORAL | Status: DC
  Administered 2022-03-06 – 2022-03-09 (×4): 10 mg via ORAL
  Filled 2022-03-06 (×5): qty 1

## 2022-03-06 MED ORDER — METOCLOPRAMIDE HCL 5 MG/ML IJ SOLN
5.0000 mg | INTRAMUSCULAR | Status: AC
  Administered 2022-03-06: 5 mg via INTRAVENOUS
  Filled 2022-03-06: qty 2

## 2022-03-06 MED ORDER — HYDRALAZINE HCL 20 MG/ML IJ SOLN
10.0000 mg | Freq: Four times a day (QID) | INTRAMUSCULAR | Status: DC | PRN
  Administered 2022-03-06 – 2022-03-08 (×4): 10 mg via INTRAVENOUS
  Filled 2022-03-06 (×5): qty 1

## 2022-03-06 MED ORDER — DIPHENHYDRAMINE HCL 50 MG/ML IJ SOLN
12.5000 mg | INTRAMUSCULAR | Status: AC
  Administered 2022-03-06: 12.5 mg via INTRAVENOUS
  Filled 2022-03-06: qty 1

## 2022-03-06 MED ORDER — KETOROLAC TROMETHAMINE 15 MG/ML IJ SOLN
15.0000 mg | INTRAMUSCULAR | Status: AC
  Administered 2022-03-06: 15 mg via INTRAVENOUS
  Filled 2022-03-06: qty 1

## 2022-03-06 NOTE — Progress Notes (Signed)
Pt c/o persistent and increasing headache throughout the night. BP elevated throughout the day. PRN hydralazine ordered and pt  ome med norvasc ordered and given per MD order. Pt has history of migraines, refuses Imitrex d/t allergy. Pt states he took Amerge (naratriptan) over a year ago with success. Neuro check WNL. MD notified.

## 2022-03-06 NOTE — Plan of Care (Signed)
  Problem: Education: Goal: Knowledge of General Education information will improve Description: Including pain rating scale, medication(s)/side effects and non-pharmacologic comfort measures Outcome: Progressing   Problem: Clinical Measurements: Goal: Will remain free from infection Outcome: Progressing   Problem: Clinical Measurements: Goal: Respiratory complications will improve Outcome: Progressing   Problem: Nutrition: Goal: Adequate nutrition will be maintained Outcome: Progressing   Problem: Coping: Goal: Level of anxiety will decrease Outcome: Progressing   Problem: Elimination: Goal: Will not experience complications related to bowel motility Outcome: Progressing Goal: Will not experience complications related to urinary retention Outcome: Progressing   Problem: Pain Managment: Goal: General experience of comfort will improve Outcome: Progressing   Problem: Safety: Goal: Ability to remain free from injury will improve Outcome: Progressing   Problem: Skin Integrity: Goal: Risk for impaired skin integrity will decrease Outcome: Progressing

## 2022-03-06 NOTE — Progress Notes (Signed)
   Subjective/Chief Complaint: Still with nausea, no emesis but dry heaves yesterday, some flatus, no bm   Objective: Vital signs in last 24 hours: Temp:  [98.3 F (36.8 C)-98.5 F (36.9 C)] 98.3 F (36.8 C) (11/06 0900) Pulse Rate:  [68-83] 81 (11/06 0900) Resp:  [16-19] 17 (11/06 0506) BP: (149-175)/(98-104) 166/98 (11/06 0900) SpO2:  [98 %-99 %] 99 % (11/06 0900) Last BM Date : 03/03/22  Intake/Output from previous day: 11/05 0701 - 11/06 0700 In: 3343.3 [P.O.:640; I.V.:2703.3] Out: 3325 [Urine:3325] Intake/Output this shift: Total I/O In: 120 [P.O.:120] Out: -   Gen:  Alert, NAD Abd: soft, nondistended,appropriately tender at incisions, mild bruising around incisions but no infection  Lab Results:  Recent Labs    03/04/22 0307 03/06/22 0352  WBC 13.9* 9.4  HGB 13.0 14.5  HCT 37.0* 40.9  PLT 228 261   BMET Recent Labs    03/04/22 0307 03/06/22 0352  NA 140 141  K 3.7 3.9  CL 111 112*  CO2 20* 22  GLUCOSE 117* 113*  BUN 16 13  CREATININE 0.80 0.92  CALCIUM 9.1 9.6   PT/INR No results for input(s): "LABPROT", "INR" in the last 72 hours. ABG No results for input(s): "PHART", "HCO3" in the last 72 hours.  Invalid input(s): "PCO2", "PO2"  Studies/Results: No results found.  Anti-infectives: Anti-infectives (From admission, onward)    None       Assessment/Plan: Readmit with Nausea/ vomiting Concern for breakthrough seizure - neurology has seen, EEG negative  POD#4 s/p lap BIHR with mesh by Dr. Rosendo Gros 11/2  - CT reviewed with MD, free air.  Abdominal exam benign, no concern for perforated viscus - Ongoing nausea, no emesis. Will schedule reglan as this seemed to help this morning. Increase bowel regimen (miralax, with suppository PRN if no results). Schedule tylenol and robaxin to limit narcotic needs in case this is factoring into nausea. Mobilize. FLD, ADAT. -will repeat ab film this am- he really isnt distended at all, not sure why  symptoms occurring   ID - none FEN - IVF, FLD VTE - lovenox Foley - none  Rolm Bookbinder 03/06/2022

## 2022-03-06 NOTE — Progress Notes (Signed)
Mobility Specialist Progress Note:   03/06/22 1326  Mobility  Activity Ambulated with assistance in hallway  Level of Assistance Contact guard assist, steadying assist  Assistive Device Front wheel walker  Distance Ambulated (ft) 250 ft  Activity Response Tolerated well  Mobility Referral Yes  $Mobility charge 1 Mobility   Pt received in bed and agreeable. C/o arm trembling that worsened with fatigue from session. Pt left in bed with all needs met, call bell in reach, MD and family in room.   Aaima Gaddie Mobility Specialist-Acute Rehab Secure Chat only

## 2022-03-06 NOTE — Progress Notes (Signed)
PROGRESS NOTE    Matthew [name]  ZOX:[id number] DOB: [birth year] DOA: 03/03/2022 PCP: Soundra Pilon, FNP   Brief Narrative:  This 66 year old Caucasian male with PMH significant for hypertension, GERD, obstructive sleep apnea, seizure disorder, anxiety disorder, history of AVMs, bipolar 1 disorder who has been off lamotrigine for sometime. He normally takes them twice a day.  Patient presented to med [facility] with concern for fever, abdominal pain and altered mental status. Patient underwent successful hernia repair 2 days ago before admission.  Patient reports he has been compliant with his lamotrigine.  Patient denies having a seizure.  Altered mentation seems to have improved.  CT abdomen and pelvis was completed following abdominal pain which showed pneumoperitoneum.  General surgery is consulted,  recommended conservative management.  Assessment & Plan:   Principal Problem:   Surgical pneumoperitoneum Active Problems:   Bipolar I disorder (HCC)   Essential hypertension   GERD   Hypokalemia   Vomiting   Seizure (HCC)  Pneumoperitoneum: -According to the surgical team, likely secondary to postop changes. -Symptoms may be multifactorial, postoperative ileus, seizure history/medications. -Surgery team is directing care. -Surgery cleared patient for diet advancement. If he tolerates solids he can be discharged.   Postoperative ileus: -Improving -Patient has broken wind. -Hyperkalemia has improved significantly.   Breakthrough seizure: -Patient was evaluated by neurology.  He has been started back on Lamictal. -EEG shows no evidence of seizures.   Hypokalemia: Replaced.  Resolved  Bilateral inguinal hernia; POD  # 3 status post bilateral inguinal hernia repair. Continue management as per surgery.   Bipolar disorder :Stable.  Continue to monitor.   Essential hypertension: Continue Norvasc 10 mg daily Continue Coreg 3.125 mg twice daily. Hydralazine 5 mg  IV  every 6 as needed   Obstructive sleep apnea: CPAP as needed   DVT prophylaxis: Lovenox Code Status: Full code Family Communication: Wife at bed side. Disposition Plan:   Status is: Inpatient Remains inpatient appropriate because: Admitted for altered mental status could be secondary to seizures, developed abdominal pain,  CT abdomen shows pneumoperitoneum,  General surgery consulted recommended conservative management.   Consultants:  General surgery  Procedures: None Antimicrobials:  Anti-infectives (From admission, onward)    None      Subjective: Patient was seen and examined at bedside.  Overnight events noted.   Patient reports doing much better.  He denies any abdominal pain. He is tolerating full liquid diet.  Asking to advance it.  Objective: Vitals:   03/06/22 0200 03/06/22 0347 03/06/22 0506 03/06/22 0900  BP:  (!) 153/99 (!) 149/102 (!) 166/98  Pulse:  83 75 81  Resp: 19  17   Temp:   98.3 F (36.8 C) 98.3 F (36.8 C)  TempSrc:    Oral  SpO2:   98% 99%  Weight:      Height:        Intake/Output Summary (Last 24 hours) at 03/06/2022 1319 Last data filed at 03/06/2022 1212 Gross per 24 hour  Intake 3183.31 ml  Output 3525 ml  Net -341.69 ml   Filed Weights   03/03/22 1119 03/03/22 1931  Weight: 65.4 kg 67.5 kg    Examination:  General exam: Appears comfortable, not in any acute distress.  Deconditioned Respiratory system: CTA bilaterally, respiratory effort normal, RR 15 Cardiovascular system: S1 & S2 heard, rhythm, no murmur. Gastrointestinal system: Abdomen is soft, non tender, non distended, BS+. Central nervous system: Alert and oriented x 3. No focal neurological deficits. Extremities:  No edema, no cyanosis, no clubbing. Skin: No rashes, lesions or ulcers Psychiatry: Judgement and insight appear normal. Mood & affect appropriate.     Data Reviewed: I have personally reviewed following labs and imaging studies  CBC: Recent Labs  Lab  03/03/22 1203 03/03/22 1936 03/04/22 0307 03/06/22 0352 03/06/22 1106  WBC 14.7* 16.3* 13.9* 9.4 6.7  NEUTROABS  --   --   --  6.2  --   HGB 14.8 14.9 13.0 14.5 15.2  HCT 41.9 41.6 37.0* 40.9 44.1  MCV 85.5 85.8 86.7 85.6 87.3  PLT 268 261 228 261 273   Basic Metabolic Panel: Recent Labs  Lab 03/03/22 1203 03/03/22 1936 03/04/22 0307 03/06/22 0352  NA 135  --  140 141  K 3.1*  --  3.7 3.9  CL 103  --  111 112*  CO2 22  --  20* 22  GLUCOSE 137*  --  117* 113*  BUN 22  --  16 13  CREATININE 0.87 0.92 0.80 0.92  CALCIUM 9.1  --  9.1 9.6  MG  --   --   --  1.7  PHOS  --   --   --  2.2*   GFR: Estimated Creatinine Clearance: 75.4 mL/min (by C-G formula based on SCr of 0.92 mg/dL). Liver Function Tests: Recent Labs  Lab 03/03/22 1203 03/04/22 0307 03/06/22 0352  AST 20 14*  --   ALT 12 10  --   ALKPHOS 52 40  --   BILITOT 1.0 1.0  --   PROT 7.3 5.7*  --   ALBUMIN 4.3 3.5 3.6   No results for input(s): "LIPASE", "AMYLASE" in the last 168 hours. Recent Labs  Lab 03/03/22 1203  AMMONIA 21   Coagulation Profile: No results for input(s): "INR", "PROTIME" in the last 168 hours. Cardiac Enzymes: No results for input(s): "CKTOTAL", "CKMB", "CKMBINDEX", "TROPONINI" in the last 168 hours. BNP (last 3 results) No results for input(s): "PROBNP" in the last 8760 hours. HbA1C: No results for input(s): "HGBA1C" in the last 72 hours. CBG: Recent Labs  Lab 03/04/22 1238  GLUCAP 111*   Lipid Profile: No results for input(s): "CHOL", "HDL", "LDLCALC", "TRIG", "CHOLHDL", "LDLDIRECT" in the last 72 hours. Thyroid Function Tests: No results for input(s): "TSH", "T4TOTAL", "FREET4", "T3FREE", "THYROIDAB" in the last 72 hours. Anemia Panel: No results for input(s): "VITAMINB12", "FOLATE", "FERRITIN", "TIBC", "IRON", "RETICCTPCT" in the last 72 hours. Sepsis Labs: Recent Labs  Lab 03/03/22 1203  LATICACIDVEN 1.5    Recent Results (from the past 240 hour(s))  Blood  culture (routine x 2)     Status: None (Preliminary result)   Collection Time: 03/03/22  7:38 PM   Specimen: BLOOD RIGHT ARM  Result Value Ref Range Status   Specimen Description BLOOD RIGHT ARM  Final   Special Requests   Final    BOTTLES DRAWN AEROBIC AND ANAEROBIC Blood Culture results may not be optimal due to an excessive volume of blood received in culture bottles   Culture   Final    NO GROWTH 3 DAYS Performed at [facility], [address], [place], [place] [address]    Report Status PENDING  Incomplete   Radiology Studies: DG Abd Portable 1V  Result Date: 03/06/2022 CLINICAL DATA:  Evaluate for ileus. Recent lower abdominal hernia repair, presenting with fever and vomiting EXAM: PORTABLE ABDOMEN - 1 VIEW COMPARISON:  Abdominal radiograph November 01, 2021 FINDINGS: The bowel gas pattern is normal. The left upper  quadrant is partially excluded from the field of view. Surgical clips overlying the right hemipelvis. No radio-opaque calculi or other significant radiographic abnormality are seen. IMPRESSION: Negative. Electronically Signed   By: Beryle Flock M.D.   On: 03/06/2022 11:35    Scheduled Meds:  acetaminophen  650 mg Oral Q6H   amLODipine  10 mg Oral Daily   carvedilol  3.125 mg Oral Daily   docusate sodium  100 mg Oral BID   enoxaparin (LOVENOX) injection  40 mg Subcutaneous Q24H   lamoTRIgine  200 mg Oral BID   methocarbamol  500 mg Oral Q6H   metoCLOPramide (REGLAN) injection  5 mg Intravenous Q6H   polyethylene glycol  17 g Oral Daily   potassium chloride  40 mEq Oral Once   Continuous Infusions:  sodium chloride Stopped (03/03/22 1647)   dextrose 5 % and 0.9 % NaCl with KCl 40 mEq/L 100 mL/hr at 03/06/22 0831     LOS: 1 day    Time spent: 50 mins    Minela Bridgewater, MD [facility]   If 7PM-7AM, please contact night-coverage

## 2022-03-06 NOTE — TOC Initial Note (Signed)
Transition of Care [facility]) - Initial/Assessment Note    Patient Details  Name: Matthew [name] MRN: [id number] Date of Birth: [birth year]  Transition of Care [facility]) CM/SW Contact:    Tom-Johnson, Renea Ee, RN Phone Number: 03/06/2022, 2:21 PM  Clinical Narrative:                  CM spoke with patient, spouse and daughter at bedside about needs for post hospital transition. Admitted for Surgical Pneumoperitoneum. Patient recently had Hernia Repair surgery, Surgery consulted and recommends Conservative treatments.  From home with wife, has one supportive daughter. Currently retired. Independent with care and drive self prior to admission. Has hx of Bipolar Disorder, Seizure disorder, Anxiety disorder and AVM's, disorder. States he is compliant with his meds.  Has a cane, walker and shower seat at home. PCP is Kristen Loader, FNP and uses [facility] on [address] in [place]. No TOC needs or recommendations noted at this time. CM will continue to follow as patient progresses with care towards discharge.       Expected Discharge Plan: Home/Self Care Barriers to Discharge: Continued Medical Work up   Patient Goals and CMS Choice Patient states their goals for this hospitalization and ongoing recovery are:: To return home CMS Medicare.gov Compare Post Acute Care list provided to:: Patient Choice offered to / list presented to : NA  Expected Discharge Plan and Services Expected Discharge Plan: Home/Self Care   Discharge Planning Services: CM Consult Post Acute Care Choice: NA Living arrangements for the past 2 months: Single Family Home                 DME Arranged: N/A DME Agency: NA       HH Arranged: NA HH Agency: NA        Prior Living Arrangements/Services Living arrangements for the past 2 months: Single Family Home Lives with:: Spouse Patient language and need for interpreter reviewed:: Yes Do you feel safe going back to the place where you  live?: Yes      Need for Family Participation in Patient Care: Yes (Comment) Care giver support system in place?: Yes (comment) Current home services: DME (Cane, walker, shower seat.) Criminal Activity/Legal Involvement Pertinent to Current Situation/Hospitalization: No - Comment as needed  Activities of Daily Living Home Assistive Devices/Equipment: None ADL Screening (condition at time of admission) [name] cognitive ability adequate to safely complete daily activities?: Yes Is the patient deaf or have difficulty hearing?: No Does the patient have difficulty seeing, even when wearing glasses/contacts?: No Does the patient have difficulty concentrating, remembering, or making decisions?: No Patient able to express need for assistance with ADLs?: Yes Does the patient have difficulty dressing or bathing?: No Independently performs ADLs?: Yes (appropriate for developmental age) Does the patient have difficulty walking or climbing stairs?: No Weakness of Legs: None Weakness of Arms/Hands: None  Permission Sought/Granted Permission sought to share information with : Case Manager, Family Supports Permission granted to share information with : Yes, Verbal Permission Granted              Emotional Assessment Appearance:: Appears stated age Attitude/Demeanor/Rapport: Engaged, Gracious Affect (typically observed): Accepting, Appropriate, Hopeful, Pleasant Orientation: : Oriented to Self, Oriented to Place, Oriented to  Time, Oriented to Situation Alcohol / Substance Use: Not Applicable Psych Involvement: No (comment)  Admission diagnosis:  Vomiting [R11.10] Patient Active Problem List   Diagnosis Date Noted   Vomiting 03/03/2022   Surgical pneumoperitoneum 03/03/2022   Seizure (  [place]) 03/03/2022   AMS (altered mental status) 10/30/2021   AKI (acute kidney injury) ([place]) 10/30/2021   Enlarged prostate 10/30/2021   Hypokalemia 10/30/2021   CVA (cerebral vascular accident) ([place])  07/05/2021   AVM (arteriovenous malformation) of colon 02/04/2021   Fall 05/04/2020   GERD 09/01/2009   HEMATOCHEZIA 06/08/2009   ARM PAIN 12/24/2008   DYSESTHESIA 12/24/2008   ABDOMINAL PAIN 01/31/2008   ANXIETY 04/22/2007   PANIC ATTACK 04/22/2007   VERTIGO 04/22/2007   CHEST PAIN, UNSPECIFIED 04/22/2007   Bipolar I disorder ([place]) 02/21/2007   MIGRAINE VARIANTS, W/INTRACTABLE MIGRAINE 02/21/2007   NAUSEA WITH VOMITING 02/21/2007   U R I 02/18/2007   CONSTIPATION 02/18/2007   Essential hypertension 02/16/2007   PCP:  Kristen Loader, FNP Pharmacy:   [facility] [place], [address] [place] [address] Phone: [phone] Fax: [phone]     Social Determinants of Health (SDOH) Interventions Housing Interventions: Intervention Not Indicated  Readmission Risk Interventions     No data to display

## 2022-03-07 DIAGNOSIS — K668 Other specified disorders of peritoneum: Secondary | ICD-10-CM | POA: Diagnosis not present

## 2022-03-07 LAB — COMPREHENSIVE METABOLIC PANEL
ALT: 10 U/L (ref 0–44)
AST: 11 U/L — ABNORMAL LOW (ref 15–41)
Albumin: 3.4 g/dL — ABNORMAL LOW (ref 3.5–5.0)
Alkaline Phosphatase: 42 U/L (ref 38–126)
Anion gap: 5 (ref 5–15)
BUN: 17 mg/dL (ref 8–23)
CO2: 22 mmol/L (ref 22–32)
Calcium: 9.1 mg/dL (ref 8.9–10.3)
Chloride: 111 mmol/L (ref 98–111)
Creatinine, Ser: 0.93 mg/dL (ref 0.61–1.24)
GFR, Estimated: >60 mL/min (ref >60)
Glucose, Bld: 115 mg/dL — ABNORMAL HIGH (ref 70–99)
Potassium: 3.8 mmol/L (ref 3.5–5.1)
Sodium: 138 mmol/L (ref 135–145)
Total Bilirubin: 1.4 mg/dL — ABNORMAL HIGH (ref 0.3–1.2)
Total Protein: 5.6 g/dL — ABNORMAL LOW (ref 6.5–8.1)

## 2022-03-07 LAB — MAGNESIUM: Magnesium: 1.7 mg/dL (ref 1.7–2.4)

## 2022-03-07 LAB — PHOSPHORUS: Phosphorus: 3 mg/dL (ref 2.5–4.6)

## 2022-03-07 MED ORDER — DIPHENHYDRAMINE HCL 50 MG/ML IJ SOLN
25.0000 mg | Freq: Once | INTRAMUSCULAR | Status: AC
  Administered 2022-03-07: 25 mg via INTRAVENOUS
  Filled 2022-03-07: qty 1

## 2022-03-07 MED ORDER — SODIUM CHLORIDE 0.9 % IV BOLUS
1000.0000 mL | Freq: Once | INTRAVENOUS | Status: AC
  Administered 2022-03-07: 1000 mL via INTRAVENOUS

## 2022-03-07 MED ORDER — DEXAMETHASONE SODIUM PHOSPHATE 10 MG/ML IJ SOLN
10.0000 mg | Freq: Once | INTRAMUSCULAR | Status: AC
  Administered 2022-03-07: 10 mg via INTRAVENOUS
  Filled 2022-03-07: qty 1

## 2022-03-07 MED ORDER — METOCLOPRAMIDE HCL 5 MG/ML IJ SOLN
10.0000 mg | Freq: Four times a day (QID) | INTRAMUSCULAR | Status: DC
  Administered 2022-03-07 – 2022-03-09 (×8): 10 mg via INTRAVENOUS
  Filled 2022-03-07 (×8): qty 2

## 2022-03-07 MED ORDER — PROCHLORPERAZINE EDISYLATE 10 MG/2ML IJ SOLN
10.0000 mg | Freq: Once | INTRAMUSCULAR | Status: AC
  Administered 2022-03-07: 10 mg via INTRAVENOUS
  Filled 2022-03-07: qty 2

## 2022-03-07 NOTE — Progress Notes (Signed)
Aileen Fass DO [facility] notified via chat that patient was having N/V  and anxiety.new orders received

## 2022-03-07 NOTE — Progress Notes (Signed)
Mobility Specialist Progress Note:   03/07/22 1500  Mobility  Activity Ambulated with assistance in hallway  Level of Assistance Contact guard assist, steadying assist  Assistive Device Front wheel walker  Distance Ambulated (ft) 300 ft  Activity Response Tolerated well  Mobility Referral Yes  $Mobility charge 1 Mobility   Pt agreeable to mobility session. Required CGA for safety d/t dizziness. Pt back in bed with all needs met.   Nelta Numbers Acute Rehab Secure Chat or Office Phone: [phone]

## 2022-03-07 NOTE — Plan of Care (Signed)

## 2022-03-07 NOTE — Progress Notes (Signed)
PROGRESS NOTE    SIRAJ DERMODY  WUJ:[id number] DOB: 1955-09-06 DOA: 03/03/2022 PCP: Soundra Pilon, FNP   Brief Narrative:  This 66 year old Caucasian male with PMH significant for hypertension, GERD, obstructive sleep apnea, seizure disorder, anxiety disorder, history of AVMs, bipolar 1 disorder who has been on lamotrigine. Patient presented to med [facility] with concerns for fever, abdominal pain and altered mental status. Patient underwent successful hernia repair 2 days ago before admission. Patient reports he has been compliant with his lamotrigine. Patient denies having a seizure. Altered mentation seems to have improved.  CT abdomen and pelvis was completed following abdominal pain which showed pneumoperitoneum.  General surgery is consulted,  recommended conservative management.  Assessment & Plan:   Principal Problem:   Surgical pneumoperitoneum Active Problems:   Bipolar I disorder (HCC)   Essential hypertension   GERD   Hypokalemia   Vomiting   Seizure (HCC)  Pneumoperitoneum: -According to the surgical team, likely secondary to postop changes. -Symptoms may be multifactorial, postoperative ileus, seizure history/medications. -Surgery team is directing care. -Surgery cleared patient for diet advancement.  - If he tolerates solids he can be discharged.   Postoperative ileus: -Improving -Patient has broken wind. -Hyperkalemia has improved significantly.   Breakthrough seizure: -Patient was evaluated by neurology.  He has been started back on Lamictal. -EEG shows no evidence of seizures.   Hypokalemia: Replaced.  Resolved  Bilateral inguinal hernia; POD  # 4 status post bilateral inguinal hernia repair. Continue management as per surgery.   Bipolar disorder :Stable.  Continue to monitor.   Essential hypertension: Continue Norvasc 10 mg daily Continue Coreg 3.125 mg twice daily. Hydralazine 5 mg  IV every 6 as needed   Obstructive sleep  apnea: CPAP as needed  Migraine: CT head without any acute abnormality    DVT prophylaxis: Lovenox Code Status: Full code Family Communication: Wife at bed side. Disposition Plan:   Status is: Inpatient Remains inpatient appropriate because: Admitted for altered mental status could be secondary to seizures, developed abdominal pain,  CT abdomen shows pneumoperitoneum,  General surgery consulted recommended conservative management.   Consultants:  General surgery  Procedures: None Antimicrobials:  Anti-infectives (From admission, onward)    None      Subjective: Patient was seen and examined at bedside.  Overnight events noted.   Patient reports doing slightly better, he denies any abdominal pain. He continued to complain about having migraine.  CT head was done which was negative. He is tolerating full liquid diet.   Objective: Vitals:   03/07/22 0438 03/07/22 0855 03/07/22 1145 03/07/22 1245  BP: (!) 168/89 (!) 167/101 (!) 171/109 (!) 163/98  Pulse: 81 78 87 94  Resp:  17    Temp: 98.2 F (36.8 C) 98 F (36.7 C) 98.5 F (36.9 C)   TempSrc: Oral     SpO2: 99% 98% 100%   Weight:      Height:        Intake/Output Summary (Last 24 hours) at 03/07/2022 1303 Last data filed at 03/07/2022 0536 Gross per 24 hour  Intake 900.56 ml  Output 1100 ml  Net -199.44 ml   Filed Weights   03/03/22 1119 03/03/22 1931  Weight: 65.4 kg 67.5 kg    Examination:  General exam: Appears comfortable, not in any acute distress.  Deconditioned Respiratory system: CTA bilaterally, respiratory effort normal, RR 15 Cardiovascular system: S1 & S2 heard, rhythm, no murmur. Gastrointestinal system: Abdomen is soft, non tender, nondistended, BS+ Central nervous  system: Alert and oriented x 3. No focal neurological deficits. Extremities: No edema, no cyanosis, no clubbing. Skin: No rashes, lesions or ulcers Psychiatry: Judgement and insight appear normal. Mood & affect appropriate.      Data Reviewed: I have personally reviewed following labs and imaging studies  CBC: Recent Labs  Lab 03/03/22 1203 03/03/22 1936 03/04/22 0307 03/06/22 0352 03/06/22 1106  WBC 14.7* 16.3* 13.9* 9.4 6.7  NEUTROABS  --   --   --  6.2  --   HGB 14.8 14.9 13.0 14.5 15.2  HCT 41.9 41.6 37.0* 40.9 44.1  MCV 85.5 85.8 86.7 85.6 87.3  PLT 268 261 228 261 273   Basic Metabolic Panel: Recent Labs  Lab 03/03/22 1203 03/03/22 1936 03/04/22 0307 03/06/22 0352 03/07/22 0256  NA 135  --  140 141 138  K 3.1*  --  3.7 3.9 3.8  CL 103  --  111 112* 111  CO2 22  --  20* 22 22  GLUCOSE 137*  --  117* 113* 115*  BUN 22  --  16 13 17   CREATININE 0.87 0.92 0.80 0.92 0.93  CALCIUM 9.1  --  9.1 9.6 9.1  MG  --   --   --  1.7 1.7  PHOS  --   --   --  2.2* 3.0   GFR: Estimated Creatinine Clearance: 74.6 mL/min (by C-G formula based on SCr of 0.93 mg/dL). Liver Function Tests: Recent Labs  Lab 03/03/22 1203 03/04/22 0307 03/06/22 0352 03/07/22 0256  AST 20 14*  --  11*  ALT 12 10  --  10  ALKPHOS 52 40  --  42  BILITOT 1.0 1.0  --  1.4*  PROT 7.3 5.7*  --  5.6*  ALBUMIN 4.3 3.5 3.6 3.4*   No results for input(s): "LIPASE", "AMYLASE" in the last 168 hours. Recent Labs  Lab 03/03/22 1203  AMMONIA 21   Coagulation Profile: No results for input(s): "INR", "PROTIME" in the last 168 hours. Cardiac Enzymes: No results for input(s): "CKTOTAL", "CKMB", "CKMBINDEX", "TROPONINI" in the last 168 hours. BNP (last 3 results) No results for input(s): "PROBNP" in the last 8760 hours. HbA1C: No results for input(s): "HGBA1C" in the last 72 hours. CBG: Recent Labs  Lab 03/04/22 1238  GLUCAP 111*   Lipid Profile: No results for input(s): "CHOL", "HDL", "LDLCALC", "TRIG", "CHOLHDL", "LDLDIRECT" in the last 72 hours. Thyroid Function Tests: No results for input(s): "TSH", "T4TOTAL", "FREET4", "T3FREE", "THYROIDAB" in the last 72 hours. Anemia Panel: No results for input(s):  "VITAMINB12", "FOLATE", "FERRITIN", "TIBC", "IRON", "RETICCTPCT" in the last 72 hours. Sepsis Labs: Recent Labs  Lab 03/03/22 1203  LATICACIDVEN 1.5    Recent Results (from the past 240 hour(s))  Blood culture (routine x 2)     Status: None (Preliminary result)   Collection Time: 03/03/22  7:38 PM   Specimen: BLOOD RIGHT ARM  Result Value Ref Range Status   Specimen Description BLOOD RIGHT ARM  Final   Special Requests   Final    BOTTLES DRAWN AEROBIC AND ANAEROBIC Blood Culture results may not be optimal due to an excessive volume of blood received in culture bottles   Culture   Final    NO GROWTH 4 DAYS Performed at [facility], [address] [place], [place]    Report Status PENDING  Incomplete   Radiology Studies: CT HEAD WO CONTRAST ([id number])  Result Date: 03/06/2022 CLINICAL DATA:  New onset headache, photophobia.  EXAM: CT HEAD WITHOUT CONTRAST TECHNIQUE: Contiguous axial images were obtained from the base of the skull through the vertex without intravenous contrast. RADIATION DOSE REDUCTION: This exam was performed according to the departmental dose-optimization program which includes automated exposure control, adjustment of the mA and/or kV according to patient size and/or use of iterative reconstruction technique. COMPARISON:  03/03/2022, 10/31/2021. FINDINGS: Brain: No acute intracranial hemorrhage, midline shift or mass effect. No extra-axial fluid collection. Gray-white matter differentiation is within normal limits. No hydrocephalus. Vascular: No hyperdense vessel or unexpected calcification. Skull: Normal. Negative for fracture or focal lesion. Sinuses/Orbits: Mild mucosal thickening in the ethmoid air cells bilaterally and left maxillary sinus with possible mucosal retention cyst or polyps in the left maxillary sinus and ethmoid air cells on the right. No acute orbital abnormality. Other: None. IMPRESSION: No acute intracranial process. Electronically Signed    By: Brett Fairy M.D.   On: 03/06/2022 20:49   DG Abd Portable 1V  Result Date: 03/06/2022 CLINICAL DATA:  Evaluate for ileus. Recent lower abdominal hernia repair, presenting with fever and vomiting EXAM: PORTABLE ABDOMEN - 1 VIEW COMPARISON:  Abdominal radiograph November 01, 2021 FINDINGS: The bowel gas pattern is normal. The left upper quadrant is partially excluded from the field of view. Surgical clips overlying the right hemipelvis. No radio-opaque calculi or other significant radiographic abnormality are seen. IMPRESSION: Negative. Electronically Signed   By: Beryle Flock M.D.   On: 03/06/2022 11:35    Scheduled Meds:  acetaminophen  650 mg Oral Q6H   amLODipine  10 mg Oral Daily   carvedilol  3.125 mg Oral Daily   docusate sodium  100 mg Oral BID   enoxaparin (LOVENOX) injection  40 mg Subcutaneous Q24H   lamoTRIgine  200 mg Oral BID   methocarbamol  500 mg Oral Q6H   metoCLOPramide (REGLAN) injection  10 mg Intravenous Q6H   polyethylene glycol  17 g Oral Daily   potassium chloride  40 mEq Oral Once   Continuous Infusions:  sodium chloride Stopped (03/03/22 1647)   dextrose 5 % and 0.9 % NaCl with KCl 40 mEq/L 100 mL/hr at 03/07/22 0536     LOS: 2 days    Time spent: 35 mins    Naidelin Gugliotta, MD [facility]   If 7PM-7AM, please contact night-coverage

## 2022-03-07 NOTE — Care Management Important Message (Signed)
Important Message  Patient Details  Name: Matthew [name] MRN: [id number] Date of Birth: 09-24-1955   Medicare Important Message Given:  Yes     Orbie Pyo 03/07/2022, 2:31 PM

## 2022-03-07 NOTE — Progress Notes (Signed)
   Subjective/Chief Complaint: Pt looks good today from a hernia perspective. Pt con't with nausea vomitting and headaches. CT head results noted   Objective: Vital signs in last 24 hours: Temp:  [98 F (36.7 C)-98.8 F (37.1 C)] 98 F (36.7 C) (11/07 0855) Pulse Rate:  [78-90] 78 (11/07 0855) Resp:  [17-19] 17 (11/07 0855) BP: (162-189)/(89-104) 167/101 (11/07 0855) SpO2:  [98 %-100 %] 98 % (11/07 0855) Last BM Date : 03/06/22  Intake/Output from previous day: 11/06 0701 - 11/07 0700 In: 1334.4 [P.O.:240; I.V.:1094.4] Out: 1300 [Urine:1300] Intake/Output this shift: No intake/output data recorded.  General appearance: alert and cooperative GI: soft, approp ttp, ND, some bruising  Lab Results:  Recent Labs    03/06/22 0352 03/06/22 1106  WBC 9.4 6.7  HGB 14.5 15.2  HCT 40.9 44.1  PLT 261 273   BMET Recent Labs    03/06/22 0352 03/07/22 0256  NA 141 138  K 3.9 3.8  CL 112* 111  CO2 22 22  GLUCOSE 113* 115*  BUN 13 17  CREATININE 0.92 0.93  CALCIUM 9.6 9.1   PT/INR No results for input(s): "LABPROT", "INR" in the last 72 hours. ABG No results for input(s): "PHART", "HCO3" in the last 72 hours.  Invalid input(s): "PCO2", "PO2"  Studies/Results: CT HEAD WO CONTRAST (5MM)  Result Date: 03/06/2022 CLINICAL DATA:  New onset headache, photophobia. EXAM: CT HEAD WITHOUT CONTRAST TECHNIQUE: Contiguous axial images were obtained from the base of the skull through the vertex without intravenous contrast. RADIATION DOSE REDUCTION: This exam was performed according to the departmental dose-optimization program which includes automated exposure control, adjustment of the mA and/or kV according to patient size and/or use of iterative reconstruction technique. COMPARISON:  03/03/2022, 10/31/2021. FINDINGS: Brain: No acute intracranial hemorrhage, midline shift or mass effect. No extra-axial fluid collection. Gray-white matter differentiation is within normal limits. No  hydrocephalus. Vascular: No hyperdense vessel or unexpected calcification. Skull: Normal. Negative for fracture or focal lesion. Sinuses/Orbits: Mild mucosal thickening in the ethmoid air cells bilaterally and left maxillary sinus with possible mucosal retention cyst or polyps in the left maxillary sinus and ethmoid air cells on the right. No acute orbital abnormality. Other: None. IMPRESSION: No acute intracranial process. Electronically Signed   By: Brett Fairy M.D.   On: 03/06/2022 20:49   DG Abd Portable 1V  Result Date: 03/06/2022 CLINICAL DATA:  Evaluate for ileus. Recent lower abdominal hernia repair, presenting with fever and vomiting EXAM: PORTABLE ABDOMEN - 1 VIEW COMPARISON:  Abdominal radiograph November 01, 2021 FINDINGS: The bowel gas pattern is normal. The left upper quadrant is partially excluded from the field of view. Surgical clips overlying the right hemipelvis. No radio-opaque calculi or other significant radiographic abnormality are seen. IMPRESSION: Negative. Electronically Signed   By: Beryle Flock M.D.   On: 03/06/2022 11:35     Assessment/Plan: POD #4 from lap BIHR with mesh Pt con't to have issues with migraine.  Home meds could not be dispensed as they were expired. Will try to engage Neurology to see if there is anything they can add. Will try [place] as tol Encouraged PO.  LOS: 2 days    Ralene Ok 03/07/2022

## 2022-03-08 DIAGNOSIS — K668 Other specified disorders of peritoneum: Secondary | ICD-10-CM | POA: Diagnosis not present

## 2022-03-08 DIAGNOSIS — G8918 Other acute postprocedural pain: Secondary | ICD-10-CM

## 2022-03-08 DIAGNOSIS — Z9889 Other specified postprocedural states: Secondary | ICD-10-CM

## 2022-03-08 DIAGNOSIS — R569 Unspecified convulsions: Secondary | ICD-10-CM | POA: Diagnosis not present

## 2022-03-08 DIAGNOSIS — R112 Nausea with vomiting, unspecified: Secondary | ICD-10-CM | POA: Diagnosis not present

## 2022-03-08 LAB — CULTURE, BLOOD (ROUTINE X 2)
Culture: NO GROWTH
Culture: NO GROWTH
Special Requests: ADEQUATE

## 2022-03-08 MED ORDER — PROCHLORPERAZINE EDISYLATE 10 MG/2ML IJ SOLN
10.0000 mg | Freq: Once | INTRAMUSCULAR | Status: AC
  Administered 2022-03-08: 10 mg via INTRAVENOUS
  Filled 2022-03-08: qty 2

## 2022-03-08 MED ORDER — MUSCLE RUB 10-15 % EX CREA
TOPICAL_CREAM | CUTANEOUS | Status: DC | PRN
  Filled 2022-03-08: qty 85

## 2022-03-08 MED ORDER — BISACODYL 10 MG RE SUPP
10.0000 mg | Freq: Once | RECTAL | Status: AC
  Administered 2022-03-08: 10 mg via RECTAL
  Filled 2022-03-08: qty 1

## 2022-03-08 MED ORDER — LABETALOL HCL 5 MG/ML IV SOLN
20.0000 mg | INTRAVENOUS | Status: DC | PRN
  Filled 2022-03-08: qty 4

## 2022-03-08 MED ORDER — DIPHENHYDRAMINE HCL 50 MG/ML IJ SOLN
25.0000 mg | Freq: Once | INTRAMUSCULAR | Status: AC
  Administered 2022-03-08: 25 mg via INTRAVENOUS
  Filled 2022-03-08: qty 1

## 2022-03-08 MED ORDER — DEXAMETHASONE SODIUM PHOSPHATE 10 MG/ML IJ SOLN
8.0000 mg | Freq: Once | INTRAMUSCULAR | Status: AC
  Administered 2022-03-08: 8 mg via INTRAVENOUS
  Filled 2022-03-08: qty 0.8

## 2022-03-08 MED ORDER — SODIUM CHLORIDE 0.9 % IV BOLUS
1000.0000 mL | Freq: Once | INTRAVENOUS | Status: AC
  Administered 2022-03-08: 1000 mL via INTRAVENOUS

## 2022-03-08 MED ORDER — POLYETHYLENE GLYCOL 3350 17 G PO PACK
17.0000 g | PACK | Freq: Two times a day (BID) | ORAL | Status: DC
  Administered 2022-03-08: 17 g via ORAL
  Filled 2022-03-08 (×2): qty 1

## 2022-03-08 MED ORDER — BUTALBITAL-APAP-CAFFEINE 50-325-40 MG PO TABS
2.0000 | ORAL_TABLET | ORAL | Status: AC
  Administered 2022-03-08: 2 via ORAL
  Filled 2022-03-08: qty 2

## 2022-03-08 NOTE — Progress Notes (Signed)
PROGRESS NOTE    Matthew [name]  OYD:[id number] DOB: 09/21/1955 DOA: 03/03/2022 PCP: Soundra Pilon, FNP   Brief Narrative:  This 66 year old Caucasian male with PMH significant for hypertension, GERD, obstructive sleep apnea, seizure disorder, anxiety disorder, history of AVMs, bipolar 1 disorder who has been on lamotrigine. Patient presented to med [facility] with concerns for fever, abdominal pain and altered mental status. Patient underwent successful hernia repair 2 days ago before admission. Patient reports he has been compliant with his lamotrigine. Patient denies having a seizure. Altered mentation seems to have improved.  CT abdomen and pelvis was completed following abdominal pain which showed pneumoperitoneum.  General surgery is consulted,  recommended conservative management.  Assessment & Plan:   Principal Problem:   Surgical pneumoperitoneum Active Problems:   Bipolar I disorder (HCC)   Essential hypertension   GERD   Hypokalemia   Vomiting   Seizure (HCC)  Pneumoperitoneum Nausea and vomiting -According to the surgical team, likely secondary to postop changes. -Symptoms may be multifactorial, postoperative ileus, seizure history/medications. -Surgery team is directing care. -Surgery cleared patient for diet advancement.  -Currently on liquid diet, but having continued nausea and vomiting -Plan on advancing diet as his nausea and vomiting improves -It is felt that his headache issues may be contributing to his nausea and vomiting   Postoperative ileus: -Improving -Patient has broken wind. -Reports he has not had a bowel movement in the past week -Continue MiraLAX -We will give a Dulcolax suppository   Seizure disorder: -Patient was evaluated by neurology.  He has been started back on Lamictal. -EEG shows no evidence of seizures.   Hypokalemia: Resolved  Bilateral inguinal hernia; POD  # 5 status post bilateral inguinal hernia repair. Continue  management as per surgery.   Bipolar disorder :Stable.  Continue to monitor.   Essential hypertension: Continue Norvasc 10 mg daily Continue Coreg 3.125 mg twice daily. Hydralazine 5 mg  IV every 6 as needed   Obstructive sleep apnea: CPAP as needed  Migraine: CT head without any acute abnormality -Seen by neurology they agreed patient had intractable migraine headache, cervicogenic headaches -Recommended that patient receive headache cocktail including Benadryl, Compazine, Decadron and IV fluids. -It was felt that her current stressors of being in the hospital are likely contributing to symptoms -Can consider discharge home once oral nausea and vomiting is better and is able to tolerate diet without vomiting. -Reports that he did not tolerate sumatriptan in the past -We will give a trial of Fioricet    DVT prophylaxis: Lovenox Code Status: Full code Family Communication: Matthew [name]. Disposition Plan:   Status is: Inpatient Remains inpatient appropriate because: Admitted for persistent nausea and vomiting, inability to tolerate p.o. which appears to have persisted.  Also has refractory headaches   Consultants:  General surgery Neurology  Procedures: None Antimicrobials:  Anti-infectives (From admission, onward)    None      Subjective: He is frustrated that he is still requiring to be in the hospital.  He has had difficulty with persistent headaches.  He did receive headache cocktail earlier today by neurology and reported some improvement, but reports continued discomfort.  He has had continued nausea and vomited liquids earlier today.  Has not had a bowel movement in the past week.  Objective: Vitals:   03/07/22 1833 03/07/22 2147 03/08/22 0432 03/08/22 0755  BP: (!) 147/102 (!) 155/102 (!) 162/110 (!) 154/92  Pulse: 99 (!) 105 (!) 103 (!) 103  Resp: 18  18 18 20   Temp:  98.2 F (36.8 C) 98.4 F (36.9 C) 98.5 F (36.9 C)  TempSrc:  Oral Oral Oral  SpO2:  97% 99% 100%   Weight:      Height:        Intake/Output Summary (Last 24 hours) at 03/08/2022 1807 Last data filed at 03/08/2022 1745 Gross per 24 hour  Intake 986.7 ml  Output 3100 ml  Net -2113.3 ml   Filed Weights   03/03/22 1119 03/03/22 1931  Weight: 65.4 kg 67.5 kg    Examination:  General exam: Alert, awake, oriented x 3 Respiratory system: Clear to auscultation. Respiratory effort normal. Cardiovascular system:RRR. No murmurs, rubs, gallops. Gastrointestinal system: Abdomen is nondistended, soft and tender in right lower quadrant. No organomegaly or masses felt. Normal bowel sounds heard. Central nervous system: Alert and oriented. No focal neurological deficits. Extremities: No C/C/E, +pedal pulses Skin: No rashes, lesions or ulcers Psychiatry: Judgement and insight appear normal. Mood & affect appropriate. He is tearful at times when discussing his hospital stay    Data Reviewed: I have personally reviewed following labs and imaging studies  CBC: Recent Labs  Lab 03/03/22 1203 03/03/22 1936 03/04/22 0307 03/06/22 0352 03/06/22 1106  WBC 14.7* 16.3* 13.9* 9.4 6.7  NEUTROABS  --   --   --  6.2  --   HGB 14.8 14.9 13.0 14.5 15.2  HCT 41.9 41.6 37.0* 40.9 44.1  MCV 85.5 85.8 86.7 85.6 87.3  PLT 268 261 228 261 273   Basic Metabolic Panel: Recent Labs  Lab 03/03/22 1203 03/03/22 1936 03/04/22 0307 03/06/22 0352 03/07/22 0256  NA 135  --  140 141 138  K 3.1*  --  3.7 3.9 3.8  CL 103  --  111 112* 111  CO2 22  --  20* 22 22  GLUCOSE 137*  --  117* 113* 115*  BUN 22  --  16 13 17   CREATININE 0.87 0.92 0.80 0.92 0.93  CALCIUM 9.1  --  9.1 9.6 9.1  MG  --   --   --  1.7 1.7  PHOS  --   --   --  2.2* 3.0   GFR: Estimated Creatinine Clearance: 74.6 mL/min (by C-G formula based on SCr of 0.93 mg/dL). Liver Function Tests: Recent Labs  Lab 03/03/22 1203 03/04/22 0307 03/06/22 0352 03/07/22 0256  AST 20 14*  --  11*  ALT 12 10  --  10  ALKPHOS 52  40  --  42  BILITOT 1.0 1.0  --  1.4*  PROT 7.3 5.7*  --  5.6*  ALBUMIN 4.3 3.5 3.6 3.4*   No results for input(s): "LIPASE", "AMYLASE" in the last 168 hours. Recent Labs  Lab 03/03/22 1203  AMMONIA 21   Coagulation Profile: No results for input(s): "INR", "PROTIME" in the last 168 hours. Cardiac Enzymes: No results for input(s): "CKTOTAL", "CKMB", "CKMBINDEX", "TROPONINI" in the last 168 hours. BNP (last 3 results) No results for input(s): "PROBNP" in the last 8760 hours. HbA1C: No results for input(s): "HGBA1C" in the last 72 hours. CBG: Recent Labs  Lab 03/04/22 1238  GLUCAP 111*   Lipid Profile: No results for input(s): "CHOL", "HDL", "LDLCALC", "TRIG", "CHOLHDL", "LDLDIRECT" in the last 72 hours. Thyroid Function Tests: No results for input(s): "TSH", "T4TOTAL", "FREET4", "T3FREE", "THYROIDAB" in the last 72 hours. Anemia Panel: No results for input(s): "VITAMINB12", "FOLATE", "FERRITIN", "TIBC", "IRON", "RETICCTPCT" in the last 72 hours. Sepsis Labs: Recent Labs  Lab  03/03/22 1203  LATICACIDVEN 1.5    Recent Results (from the past 240 hour(s))  Blood culture (routine x 2)     Status: None   Collection Time: 03/03/22 12:15 PM   Specimen: BLOOD RIGHT FOREARM  Result Value Ref Range Status   Specimen Description   Final    BLOOD RIGHT FOREARM BLOOD Performed at [facility], [address] [place], [place] [address]    Special Requests   Final    Blood Culture adequate volume BOTTLES DRAWN AEROBIC AND ANAEROBIC Performed at [facility], [address], [place], [place] [address]    Culture   Final    NO GROWTH 5 DAYS Performed at [facility], [address] [place], [place] [address]    Report Status 03/08/2022 FINAL  Final  Blood culture (routine x 2)     Status: None   Collection Time: 03/03/22  7:38 PM   Specimen: BLOOD RIGHT ARM  Result Value Ref Range Status   Specimen Description BLOOD RIGHT ARM  Final   Special  Requests   Final    BOTTLES DRAWN AEROBIC AND ANAEROBIC Blood Culture results may not be optimal due to an excessive volume of blood received in culture bottles   Culture   Final    NO GROWTH 5 DAYS Performed at [facility], [address], [place], [place] [address]    Report Status 03/08/2022 FINAL  Final   Radiology Studies: CT HEAD WO CONTRAST ( )  Result Date: 03/06/2022 CLINICAL DATA:  New onset headache, photophobia. EXAM: CT HEAD WITHOUT CONTRAST TECHNIQUE: Contiguous axial images were obtained from the base of the skull through the vertex without intravenous contrast. RADIATION DOSE REDUCTION: This exam was performed according to the departmental dose-optimization program which includes automated exposure control, adjustment of the mA and/or kV according to patient size and/or use of iterative reconstruction technique. COMPARISON:  03/03/2022, 10/31/2021. FINDINGS: Brain: No acute intracranial hemorrhage, midline shift or mass effect. No extra-axial fluid collection. Gray-white matter differentiation is within normal limits. No hydrocephalus. Vascular: No hyperdense vessel or unexpected calcification. Skull: Normal. Negative for fracture or focal lesion. Sinuses/Orbits: Mild mucosal thickening in the ethmoid air cells bilaterally and left maxillary sinus with possible mucosal retention cyst or polyps in the left maxillary sinus and ethmoid air cells on the right. No acute orbital abnormality. Other: None. IMPRESSION: No acute intracranial process. Electronically Signed   By: Thornell Sartorius M.D.   On: 03/06/2022 20:49    Scheduled Meds:  acetaminophen  650 mg Oral Q6H   amLODipine  10 mg Oral Daily   bisacodyl  10 mg Rectal Once   carvedilol  3.125 mg Oral Daily   docusate sodium  100 mg Oral BID   enoxaparin (LOVENOX) injection  40 mg Subcutaneous Q24H   lamoTRIgine  200 mg Oral BID   methocarbamol  500 mg Oral Q6H   metoCLOPramide (REGLAN) injection  10 mg Intravenous Q6H    polyethylene glycol  17 g Oral BID   potassium chloride  40 mEq Oral Once   Continuous Infusions:  sodium chloride Stopped (03/03/22 1647)   dextrose 5 % and 0.9 % NaCl with KCl 40 mEq/L 100 mL/hr at 03/08/22 0011     LOS: 3 days    Time spent: 35 mins    Erick Blinks, MD [facility]   If 7PM-7AM, please contact night-coverage

## 2022-03-08 NOTE — Progress Notes (Signed)
Subjective/Chief Complaint: Patient with some relief this morning.  He states that things overall better throughout the day and count of progress to the evening time and get worse. He states he slept about 5 to 6 hours overnight.  Patient states he took some liquids which stayed down.  He states nausea is less of an issue.  No complaints of abdominal pain   Objective: Vital signs in last 24 hours: Temp:  [98 F (36.7 C)-98.5 F (36.9 C)] 98.4 F (36.9 C) (11/08 0432) Pulse Rate:  [78-105] 103 (11/08 0432) Resp:  [17-18] 18 (11/08 0432) BP: (147-171)/(98-110) 162/110 (11/08 0432) SpO2:  [97 %-100 %] 100 % (11/08 0432) Last BM Date : 03/06/22  Intake/Output from previous day: 11/07 0701 - 11/08 0700 In: 2402.9 [P.O.:900; I.V.:1502.9] Out: 1100 [Urine:1100] Intake/Output this shift: Total I/O In: 626.7 [I.V.:626.7] Out: 700 [Urine:700]  General appearance: alert and cooperative GI: soft, non-tender; bowel sounds normal; no masses,  no organomegaly and incision clean dry and intact, expected bruising  Lab Results:  Recent Labs    03/06/22 0352 03/06/22 1106  WBC 9.4 6.7  HGB 14.5 15.2  HCT 40.9 44.1  PLT 261 273   BMET Recent Labs    03/06/22 0352 03/07/22 0256  NA 141 138  K 3.9 3.8  CL 112* 111  CO2 22 22  GLUCOSE 113* 115*  BUN 13 17  CREATININE 0.92 0.93  CALCIUM 9.6 9.1   PT/INR No results for input(s): "LABPROT", "INR" in the last 72 hours. ABG No results for input(s): "PHART", "HCO3" in the last 72 hours.  Invalid input(s): "PCO2", "PO2"  Studies/Results: CT HEAD WO CONTRAST ( )  Result Date: 03/06/2022 CLINICAL DATA:  New onset headache, photophobia. EXAM: CT HEAD WITHOUT CONTRAST TECHNIQUE: Contiguous axial images were obtained from the base of the skull through the vertex without intravenous contrast. RADIATION DOSE REDUCTION: This exam was performed according to the departmental dose-optimization program which includes automated exposure  control, adjustment of the mA and/or kV according to patient size and/or use of iterative reconstruction technique. COMPARISON:  03/03/2022, 10/31/2021. FINDINGS: Brain: No acute intracranial hemorrhage, midline shift or mass effect. No extra-axial fluid collection. Gray-white matter differentiation is within normal limits. No hydrocephalus. Vascular: No hyperdense vessel or unexpected calcification. Skull: Normal. Negative for fracture or focal lesion. Sinuses/Orbits: Mild mucosal thickening in the ethmoid air cells bilaterally and left maxillary sinus with possible mucosal retention cyst or polyps in the left maxillary sinus and ethmoid air cells on the right. No acute orbital abnormality. Other: None. IMPRESSION: No acute intracranial process. Electronically Signed   By: Thornell Sartorius M.D.   On: 03/06/2022 20:49   DG Abd Portable 1V  Result Date: 03/06/2022 CLINICAL DATA:  Evaluate for ileus. Recent lower abdominal hernia repair, presenting with fever and vomiting EXAM: PORTABLE ABDOMEN - 1 VIEW COMPARISON:  Abdominal radiograph November 01, 2021 FINDINGS: The bowel gas pattern is normal. The left upper quadrant is partially excluded from the field of view. Surgical clips overlying the right hemipelvis. No radio-opaque calculi or other significant radiographic abnormality are seen. IMPRESSION: Negative. Electronically Signed   By: Jacob Moores M.D.   On: 03/06/2022 11:35    Anti-infectives: Anti-infectives (From admission, onward)    None       Assessment/Plan: POD #5 from lap BIHR with mesh Migraine headache ongoing Patient appears to be having some relief with the headache cocktail yesterday.  I discussed with him if later today he still continues with headache issues  we will have neurology formally see him. Continue with p.o. as tolerated. Doing well from surgical standpoint at this point.  Again I feel that the majority of his nausea is being driven from a headache standpoint. We will  continue to follow.    LOS: 3 days    Axel Filler 03/08/2022

## 2022-03-08 NOTE — Progress Notes (Signed)
Neurology Progress Note  Brief HPI: 66 year old male with PMHx of bipolar disorder, migraines, seizure disorder on lamotrigine, history of substance abuse, sleep apnea, HTN who presented to the ED 11/3 for fever, abdominal pain, AMS, nausea, and vomiting.  Patient had a bilateral inguinal hernia repair 11/2 with subsequent pain and vomiting.  Patient also missed his lamotrigine and on initial neurology evaluation, patient appeared weak and lethargic concerning for possible postictal presentation.  There was no clear witnessed seizure activity though there is a presumed breakthrough seizure in the setting of missed antiepileptic medications followed by postictal state.  While inpatient, patient was transitioned to IV Keppra as he was unable to tolerate p.o. medications.  Neurology was initially consulted for antiepileptic medication management.  Interval history: 11/8 neurology reconsulted for patient complaints of migraine headache minimally responsive to headache cocktail on 11/7 including Compazine, multiple doses of Dilaudid, IVF, Decadron, Reglan, and Benadryl.  Patient is also on Robaxin.  Of note, patient was seen in July of 2023 for confusion and weakness. It was suspected that polypharmacy was the cause with a subsequent accidental lithium OD. [name] psychiatrist had made some adjustments prior to admission including decreasing his lamotrigine dose from 200 mg BID and adding Depakote and seroquel. Mr. Tillett was taking his previous dose of lamotrigine in addition to Depakote and Seroquel (Seroquel reportedly has made patient sick in the past) with PRN Sonata and Klonopin. At discharge, patient was resumed on lamotrigine for bipolar disorder and seizure history and Depakote was discontinued. It was reported that patient previously took Depakote for many years but he developed a tremor and was taken off of Depakote in 2018.   Mr. Bobette Mo states that he has suffered from migraine headaches in  the past and notes that he took [place] with improvement in his headaches but notes that he has not had a migraine headache in 3-4 years. He states that he used to have floaters with visual auras and has not had these in the past 3-4 years either until yesterday.   Subjective: Patient sitting up in bed in a dark room. Patient complains of 4/10 severity constant headache that is sharp. He states the pain is in his bilateral neck at his trapezius and travels upward in a halo fashion over his forehead. He states that the pain has not been under a 2/10 in severity during admission and is at times a 10/10. He endorses nausea, one episode of vomiting yesterday, photophobia, and phonophobia. He states that the headache is worse in the evenings and it is worse with sitting up in the evenings. He also endorses periorbital pain.  He reported improvement in his headache to a 2-3/10 in severity yesterday with his headache cocktail but the headache returned some hours later.  Yesterday, his headache did improve following his morning coffee.  He reports in the past the he has had rebound headaches with the use of Dilaudid.   Exam: Vitals:   03/08/22 0432 03/08/22 0755  BP: (!) 162/110 (!) 154/92  Pulse: (!) 103 (!) 103  Resp: 18 20  Temp: 98.4 F (36.9 C) 98.5 F (36.9 C)  SpO2: 100%    Gen: Sitting up in bed, watching television on his phone. He is intermittently tearful throughout the exam.  HEENT: No tenderness to palpation of the temporal region or occipital scalp. No meningismus signs.  Resp: non-labored breathing, no acute distress, on nasal cannula 2 LMP Abd: soft, non-tender, non-distended  Neuro: Mental Status: Awake, alert, and oriented to self,  age, month, year, and situation.  Speech is fluent without dysarthria or aphasia.  Patient follows all commands without difficulty.  Cranial Nerves: PERRL, EOMI, visual fields are full, facial sensation to light touch is intact and symmetric, face is  symmetric resting and with movement, hearing is intact to voice, shoulder shrug is symmetric, palate elevates symmetrically, tongue is midline.  Motor: 5/5 strength throughout without asymmetry or noted weakness Sensory: Intact and symmetric to light touch throughout  Cerebellar: HKS and FNF intact bilaterally without dysmetria  Gait: Deferred  Pertinent Labs: CBC    Component Value Date/Time   WBC 6.7 03/06/2022 1106   RBC 5.05 03/06/2022 1106   HGB 15.2 03/06/2022 1106   HCT 44.1 03/06/2022 1106   PLT 273 03/06/2022 1106   MCV 87.3 03/06/2022 1106   MCH 30.1 03/06/2022 1106   MCHC 34.5 03/06/2022 1106   RDW 12.1 03/06/2022 1106   LYMPHSABS 2.1 03/06/2022 0352   MONOABS 0.8 03/06/2022 0352   EOSABS 0.2 03/06/2022 0352   BASOSABS 0.1 03/06/2022 0352   CMP     Component Value Date/Time   NA 138 03/07/2022 0256   NA 141 02/10/2021 1040   K 3.8 03/07/2022 0256   CL 111 03/07/2022 0256   CO2 22 03/07/2022 0256   GLUCOSE 115 (H) 03/07/2022 0256   GLUCOSE 144 (H) 05/11/2006 0818   BUN 17 03/07/2022 0256   BUN 13 02/10/2021 1040   CREATININE 0.93 03/07/2022 0256   CALCIUM 9.1 03/07/2022 0256   PROT 5.6 (L) 03/07/2022 0256   PROT 6.3 02/10/2021 1040   ALBUMIN 3.4 (L) 03/07/2022 0256   ALBUMIN 4.5 02/10/2021 1040   AST 11 (L) 03/07/2022 0256   ALT 10 03/07/2022 0256   ALKPHOS 42 03/07/2022 0256   BILITOT 1.4 (H) 03/07/2022 0256   BILITOT 0.4 02/10/2021 1040   GFRNONAA >60 03/07/2022 0256   GFRAA  06/09/2009 1707    >60        The eGFR has been calculated using the MDRD equation. This calculation has not been validated in all clinical situations. eGFR's persistently <60 mL/min signify possible Chronic Kidney Disease.   Imaging Reviewed: CT head 11/6: No acute intracranial process  Assessment: 66 year old male with PMHx of bipolar disorder, migraine headaches, seizure disorder on lamotrigine, history of substance abuse, sleep apnea, HTN who presented to the ED 11/3  for fever, abdominal pain, AMS, nausea and vomiting s/p bilateral inguinal hernia repair on 11/2.  Patient had a presumed breakthrough seizure in the setting of missed antiepileptic medications that was unwitnessed.  Since admission, patient has complained of severe migraine headaches refractory to headache cocktail of Benadryl, Compazine, IVF, Decadron, in addition to Dilaudid, oxycodone, and Robaxin.  [name] headache is at his bilateral neck and shoulders and travels upward in a halo fashion to his frontal head.  Pain is also periorbital bilaterally with intermittent floaters/visual auras. Presentation is most consistent with a refractory migraine headache. There are no signs of meningismus on exam and there is no tenderness to palpation of the scalp.  Suspect that his migraines are being triggered by cervicogenic headaches. He does have significant BL trapezius spasms and tenderness noted on deep palpation. Hospitalization is also a trigger for migraines.  Impression:  Intractable migraine headache Cervicogenic headaches History of migraine headaches  Bipolar disorder Seizure disorder  Recommendations: - Minimize narcotic use due to rebound headaches - Topical muscle rub CREA PRN for neck pain - Aquathermia heating pad PRN for neck pain - headache cocktail  x 1 with benadryl, compazine, decadron and 1L IV fluid bolus. - Hospitalization is a stressor for migraine patients and it is not uncommon for migraine to worsen while in the hospital. I discussed this with patient and he is eager to go home.  Anibal Henderson, AGACNP-BC [facility] [phone]  NEUROHOSPITALIST ADDENDUM Performed a face to face diagnostic evaluation.   I have reviewed the contents of history and physical exam as documented by PA/ARNP/Resident and agree with above documentation.  I have discussed and formulated the above plan as documented. Edits to the note have been made as needed.  Donnetta Simpers,  MD [facility] [phone]   If 7pm to 7am, please call on call as listed on AMION.

## 2022-03-08 NOTE — Progress Notes (Signed)
Patient still complaining of severe headache. Per direction of Dr. Derrell Lolling placed patient on 2L via [place] to see if that would help his headache. Patient has removed stating the tubing is hurting his head. States that even the frame of his glasses causes pressure/discomfort so he is only wearing glasses when he needs them.

## 2022-03-09 DIAGNOSIS — I10 Essential (primary) hypertension: Secondary | ICD-10-CM

## 2022-03-09 LAB — COMPREHENSIVE METABOLIC PANEL
ALT: 11 U/L (ref 0–44)
AST: 16 U/L (ref 15–41)
Albumin: 3.9 g/dL (ref 3.5–5.0)
Alkaline Phosphatase: 46 U/L (ref 38–126)
Anion gap: 7 (ref 5–15)
BUN: 16 mg/dL (ref 8–23)
CO2: 21 mmol/L — ABNORMAL LOW (ref 22–32)
Calcium: 9.4 mg/dL (ref 8.9–10.3)
Chloride: 108 mmol/L (ref 98–111)
Creatinine, Ser: 0.93 mg/dL (ref 0.61–1.24)
GFR, Estimated: >60 mL/min (ref >60)
Glucose, Bld: 108 mg/dL — ABNORMAL HIGH (ref 70–99)
Potassium: 3.3 mmol/L — ABNORMAL LOW (ref 3.5–5.1)
Sodium: 136 mmol/L (ref 135–145)
Total Bilirubin: 1 mg/dL (ref 0.3–1.2)
Total Protein: 6.4 g/dL — ABNORMAL LOW (ref 6.5–8.1)

## 2022-03-09 MED ORDER — CARVEDILOL 6.25 MG PO TABS: 6.2500 mg | ORAL_TABLET | Freq: Two times a day (BID) | ORAL | 0 refills | Status: AC

## 2022-03-09 MED ORDER — POLYETHYLENE GLYCOL 3350 17 G PO PACK: 17.0000 g | PACK | Freq: Every day | ORAL | 0 refills | Status: AC

## 2022-03-09 MED ORDER — POTASSIUM CHLORIDE CRYS ER 20 MEQ PO TBCR
40.0000 meq | EXTENDED_RELEASE_TABLET | Freq: Once | ORAL | Status: AC
  Administered 2022-03-09: 40 meq via ORAL
  Filled 2022-03-09: qty 2

## 2022-03-09 MED ORDER — CARVEDILOL 6.25 MG PO TABS: 6.2500 mg | ORAL_TABLET | Freq: Two times a day (BID) | ORAL | Status: DC

## 2022-03-09 MED ORDER — BUTALBITAL-APAP-CAFFEINE 50-325-40 MG PO TABS: 1.0000 | ORAL_TABLET | Freq: Four times a day (QID) | ORAL | 0 refills | Status: AC | PRN

## 2022-03-09 MED ORDER — METHOCARBAMOL 500 MG PO TABS: 500.0000 mg | ORAL_TABLET | Freq: Four times a day (QID) | ORAL | 0 refills | Status: DC | PRN

## 2022-03-09 NOTE — Progress Notes (Signed)
Mobility Specialist Progress Note   03/09/22 1000  Mobility  Activity Ambulated with assistance in hallway  Level of Assistance Modified independent, requires aide device or extra time  Assistive Device Other (Comment) (IV Pole)  Distance Ambulated (ft) 260 ft  Range of Motion/Exercises Active;All extremities  Activity Response Tolerated well   Pre Ambulation:  SpO2 97% (RA) During Ambulation: SpO2 97% (RA) Post Ambulation:  SpO2 97% (RA)  Patient received in bed eager to participate in mobility. Reported feeling much better, improved appetite, and no complaints of pain other than tenderness at operative site in abdominal area. Ambulated mod I with steady gait. Oxygen saturation maintained 97% on RA. Returned to room without complaint or incident. Was left dangling EOB with all needs met, call bell in reach.   [place] Nahuel Wilbert, [place], [place]  Office: [phone]

## 2022-03-09 NOTE — Plan of Care (Signed)

## 2022-03-09 NOTE — Progress Notes (Signed)
   Subjective/Chief Complaint: Pt feeling much better this AM Headache improved. Tol PO Had BM   Objective: Vital signs in last 24 hours: Temp:  [98 F (36.7 C)-98.8 F (37.1 C)] 98.4 F (36.9 C) (11/09 0532) Pulse Rate:  [89-110] 89 (11/09 0532) BP: (145-169)/(101-110) 165/103 (11/09 0532) SpO2:  [97 %-100 %] 100 % (11/09 0532) Last BM Date : 03/01/22  Intake/Output from previous day: 11/08 0701 - 11/09 0700 In: 1898.9 [P.O.:840; I.V.:1058.9] Out: 3000 [Urine:3000] Intake/Output this shift: No intake/output data recorded.  General appearance: alert and cooperative GI: soft, non-tender; bowel sounds normal; no masses,  no organomegaly and inc c/d/i  Lab Results:  Recent Labs    03/06/22 1106  WBC 6.7  HGB 15.2  HCT 44.1  PLT 273   BMET Recent Labs    03/07/22 0256 03/09/22 0601  NA 138 136  K 3.8 3.3*  CL 111 108  CO2 22 21*  GLUCOSE 115* 108*  BUN 17 16  CREATININE 0.93 0.93  CALCIUM 9.1 9.4   PT/INR No results for input(s): "LABPROT", "INR" in the last 72 hours. ABG No results for input(s): "PHART", "HCO3" in the last 72 hours.  Invalid input(s): "PCO2", "PO2"  Studies/Results: No results found.  Anti-infectives: Anti-infectives (From admission, onward)    None       Assessment/Plan: POD #6 from lap BIHR with mesh Migraine improved OK to adv diet as tol Hopefully home today.  Looks good from surgery standpoint. Will f/u as scheduled previously.    LOS: 4 days    Axel Filler 03/09/2022

## 2022-03-09 NOTE — Progress Notes (Signed)
   03/08/22 2241  Provider Notification  Provider Name/Title Dr Arville Care  Date Provider Notified 03/08/22  Time Provider Notified 2242  Method of Notification Page  Notification Reason Change in status  Provider response See new orders  Date of Provider Response 03/08/22    Rechecked pt's Bp at 2228 after hydralazine and pressure was 136/105, HR 107. Dr Notified and new orders. Gave Labetalol new order for BP of 145/101 and HR of 110.Pt HR is now 75.

## 2022-03-09 NOTE — Progress Notes (Signed)
Pt discharged home with spouse in stable condition.  DC instructions given. Scripts sent to pharmacy of choice. No immediate questions or concerns at this time. DC'd from unit via wheelchair

## 2022-03-09 NOTE — Discharge Summary (Signed)
Physician Discharge Summary  Matthew [name] [id number] DOB: 1955/11/15 DOA: 03/03/2022  PCP: Kristen Loader, FNP  Admit date: 03/03/2022 Discharge date: 03/09/2022  Admitted From: Home Disposition: Home  Recommendations for Outpatient Follow-up:  Follow up with PCP in 1-2 weeks Please obtain BMP/CBC in one week Follow-up with general surgery as previously scheduled  Home Health: Equipment/Devices:  Discharge Condition: Stable CODE STATUS: Full code Diet recommendation: Heart healthy  Brief/Interim Summary: 66 year old male with a history of hypertension, GERD, seizure disorder, bipolar disorder, had presented to emergency department with concerns for fever, abdominal pain, altered mental status and nausea and vomiting.  He had undergone bilateral hernia repair approximately 2 days prior to admission.  CT of the abdomen pelvis showed pneumoperitoneum, which was expected after recent surgery.  Due to his persistent symptoms, he was admitted to the hospital for further evaluation.  Discharge Diagnoses:  Principal Problem:   Surgical pneumoperitoneum Active Problems:   Bipolar I disorder ([place])   Essential hypertension   GERD   Hypokalemia   Vomiting   Seizure ([facility])  Pneumoperitoneum Nausea and vomiting -According to the surgical team, likely secondary to postop changes. -Symptoms may be multifactorial, postoperative ileus, seizure history/medications. -It is felt that his headache issues may be contributing to his nausea and vomiting -Once patient started having bowel movements and his headache had improved, his nausea and vomiting resolved and he was able to tolerate solid food.   Postoperative ileus: -Reports he has not had a bowel movement in the past week -Continue MiraLAX -Patient received Dulcolax suppository and is now having bowel movements -We will continue MiraLAX on discharge   Seizure disorder: -Patient was evaluated by neurology.  He has been started  back on Lamictal. -EEG shows no evidence of seizures.   Hypokalemia: Resolved   Bilateral inguinal hernia; POD  # 6 status post bilateral inguinal hernia repair. Plan to follow-up with general surgery as previously scheduled   Bipolar disorder :Stable.  Continue to monitor.   Essential hypertension: Continue Norvasc 10 mg daily Increase Coreg to 6.25 mg twice daily   Obstructive sleep apnea: CPAP as needed   Migraine: CT head without any acute abnormality -Seen by neurology they agreed patient had intractable migraine headache, cervicogenic headaches -Recommended that patient receive headache cocktail including Benadryl, Compazine, Decadron and IV fluids. -It was felt that current stressors of being in the hospital are likely contributing to symptoms -He also received a trial of Fioricet and reported significant improvement of his symptoms -Feels that headache is now resolved.  Discharge Instructions  Discharge Instructions     Diet - low sodium heart healthy   Complete by: As directed    Increase activity slowly   Complete by: As directed    No wound care   Complete by: As directed       Allergies as of 03/09/2022       Reactions   Depakote [divalproex Sodium] Nausea And Vomiting   Seroquel [quetiapine Fumarate] Nausea And Vomiting   Sumatriptan Other (See Comments)    dizziness   Tramadol    Severe dizziness, restless leg, jittery        Medication List     TAKE these medications    amLODipine 10 MG tablet Commonly known as: NORVASC Take 1 tablet (10 mg total) by mouth daily.   butalbital-acetaminophen-caffeine 50-325-40 MG tablet Commonly known as: FIORICET Take 1-2 tablets by mouth every 6 (six) hours as needed for headache.   carvedilol 6.25 MG tablet Commonly  known as: COREG Take 1 tablet (6.25 mg total) by mouth 2 (two) times daily with a meal. What changed:  medication strength how much to take   clonazePAM 1 MG tablet Commonly known  as: KLONOPIN Take 1 tablet (1 mg total) by mouth daily as needed (for severe anxiety or agitation).   HYDROcodone-acetaminophen 7.5-325 MG tablet Commonly known as: NORCO Take 1 tablet by mouth every 6 (six) hours as needed for moderate pain. Takes as needed - this is from an old rx.   lamoTRIgine 200 MG tablet Commonly known as: LAMICTAL Take 1 tablet (200 mg total) by mouth 2 (two) times daily.   methocarbamol 500 MG tablet Commonly known as: ROBAXIN Take 1 tablet (500 mg total) by mouth every 6 (six) hours as needed for muscle spasms.   polyethylene glycol 17 g packet Commonly known as: MIRALAX / GLYCOLAX Take 17 g by mouth daily.   TUMS PO Take 1 tablet by mouth daily as needed (heartburn).   Vitamin D3 250 MCG (10000 UT) capsule Take 10,000 Units by mouth once a week.   zaleplon 10 MG capsule Commonly known as: SONATA Take 1 capsule (10 mg total) by mouth at bedtime as needed for sleep.        Follow-up Information     Ralene Ok, MD Follow up.   Specialty: General Surgery Contact information: [address] [place] [address] [phone]                Allergies  Allergen Reactions   Depakote [Divalproex Sodium] Nausea And Vomiting   Seroquel [Quetiapine Fumarate] Nausea And Vomiting   Sumatriptan Other (See Comments)     dizziness   Tramadol     Severe dizziness, restless leg, jittery    Consultations: General surgery Neurology   Procedures/Studies: CT HEAD WO CONTRAST (5MM)  Result Date: 03/06/2022 CLINICAL DATA:  New onset headache, photophobia. EXAM: CT HEAD WITHOUT CONTRAST TECHNIQUE: Contiguous axial images were obtained from the base of the skull through the vertex without intravenous contrast. RADIATION DOSE REDUCTION: This exam was performed according to the departmental dose-optimization program which includes automated exposure control, adjustment of the mA and/or kV according to patient size and/or use of  iterative reconstruction technique. COMPARISON:  03/03/2022, 10/31/2021. FINDINGS: Brain: No acute intracranial hemorrhage, midline shift or mass effect. No extra-axial fluid collection. Gray-white matter differentiation is within normal limits. No hydrocephalus. Vascular: No hyperdense vessel or unexpected calcification. Skull: Normal. Negative for fracture or focal lesion. Sinuses/Orbits: Mild mucosal thickening in the ethmoid air cells bilaterally and left maxillary sinus with possible mucosal retention cyst or polyps in the left maxillary sinus and ethmoid air cells on the right. No acute orbital abnormality. Other: None. IMPRESSION: No acute intracranial process. Electronically Signed   By: Brett Fairy M.D.   On: 03/06/2022 20:49   DG Abd Portable 1V  Result Date: 03/06/2022 CLINICAL DATA:  Evaluate for ileus. Recent lower abdominal hernia repair, presenting with fever and vomiting EXAM: PORTABLE ABDOMEN - 1 VIEW COMPARISON:  Abdominal radiograph November 01, 2021 FINDINGS: The bowel gas pattern is normal. The left upper quadrant is partially excluded from the field of view. Surgical clips overlying the right hemipelvis. No radio-opaque calculi or other significant radiographic abnormality are seen. IMPRESSION: Negative. Electronically Signed   By: Beryle Flock M.D.   On: 03/06/2022 11:35   CT ABDOMEN PELVIS W CONTRAST  Addendum Date: 03/03/2022   ADDENDUM REPORT: 03/03/2022 14:56 ADDENDUM: The original report was  by Dr. Gaylyn Rong. The following addendum is by Dr. Gaylyn Rong: Critical Value/emergent results were called by telephone at the time of interpretation on 03/03/2022 at 2:44 pm to provider [facility] , who verbally acknowledged these results. Electronically Signed   By: Gaylyn Rong M.D.   On: 03/03/2022 14:56   Result Date: 03/03/2022 CLINICAL DATA:  Postop day 1 status post bilateral hernia surgery. EXAM: CT ABDOMEN AND PELVIS WITH CONTRAST TECHNIQUE:  Multidetector CT imaging of the abdomen and pelvis was performed using the standard protocol following bolus administration of intravenous contrast. RADIATION DOSE REDUCTION: This exam was performed according to the departmental dose-optimization program which includes automated exposure control, adjustment of the mA and/or kV according to patient size and/or use of iterative reconstruction technique. CONTRAST:  OMNIPAQUE IOHEXOL 300 MG/ML  SOLN COMPARISON:  11/19/2021 CT scan FINDINGS: Lower chest: Pneumomediastinum with gas tracking anterior to the pericardium. There is also some gas tracking along the soft tissues of the thoracoabdominal wall and along the margins of the rib cage, right greater than left. Mild atelectasis in both lower lobes and in the lingula. No pleural effusion. Mild cardiomegaly noted. Small amount of pneumomediastinum tracks adjacent to the distal esophagus. No definite leak of the distal esophagus identified. Hepatobiliary: Unremarkable Pancreas: Unremarkable Spleen: Unremarkable Adrenals/Urinary Tract: Small Bosniak category 1 right mid kidney cyst on image 33 series 2. Other small hypodense lesions of both kidneys are technically too small to characterize but statistically highly likely to represent cysts and do not require independent imaging follow up. Stomach/Bowel: There is substantial pneumoperitoneum, especially in the upper abdomen anterior to the liver, with extensive scattered small loculations of gas and some larger collections of anterior gas. Some of this gas is along the margins of the stomach and some along the margins of the liver and spleen, along the right omentum and left paracolic gutter, and scattered in the soft tissues of abdominal wall bilaterally. There is also some minimal trace retroperitoneal gas bilaterally. Some of the ileum tracks immediately adjacent to the right groin hernia repair as on images 68 through 73 of series 2, although a definite  perforation in this vicinity is not identified. The appendix appears normal. A specific location of perforation is not identified. No definite pneumatosis or portal venous gas. Vascular/Lymphatic: Patent mesenteric vessels, no filling defect observed in the superior mesenteric artery. Small retroperitoneal lymph nodes are likely reactive. Reproductive: Upper normal sized prostate gland. Bilateral groin hernia repairs with typical edema pattern along the inguinal ring bilaterally, along with some edema tracking along both spermatic cords and a small amount of gas tracking along both spermatic cords down towards the scrotum. There is also a small amount of gas tracking in the soft tissues the pubis and along the margins of the penis for example on image 95 series 2. Small locules of gas track in the inguinal region and adjacent to the common femoral vessels. Other: In addition to the above scattered widespread and tonic locations of gas, there is gas tracking along the distal iliopsoas musculature towards the lesser trochanters of the hips bilaterally. Musculoskeletal: Postoperative findings from recent groin hernia repair. Clips are noted anterior to the urinary bladder and along the lower linea alba and right rectus abdominus muscle. IMPRESSION: 1. Postop day 1 status post groin hernia repairs. There is considerable pneumoperitoneum and substantial gas tracking along the abdominal wall and along the groin bilaterally, mild pneumoretroperitoneum, gas tracking along the lower chest wall, and pneumomediastinum tracking adjacent to  the distal esophagus and anterior to the heart. Based on today's chest radiograph there appears to be gas tracking along the right upper mediastinum and in the right neck. This is substantially more gas than I would expect postoperative day 1 for bilateral groin hernia repair, raising the possibility of issue related to abdominal insufflation if the surgery was laparoscopic, versus a  otherwise occult hollow viscus perforation. The widespread distribution into various body compartments would be somewhat unusual for a perforated viscus which is more commonly contained to the peritoneal cavity. Correlate with prior operative note if available. 2. No direct compelling evidence of bowel ischemia; part of the distal small bowel abuts the right groin hernia repair but I do not observe herniated or strangulated bowel in this vicinity. 3. Mild cardiomegaly. 4. Mild atelectasis in both lower lobes. Radiology assistant personnel have been notified to put me in telephone contact with the referring physician or the referring physician's clinical representative in order to discuss these findings. Once this communication is established I will issue an addendum to this report for documentation purposes. Electronically Signed: By: Van Clines M.D. On: 03/03/2022 14:39   CT Head Wo Contrast  Result Date: 03/03/2022 CLINICAL DATA:  Fever and confusion after hernia surgery. EXAM: CT HEAD WITHOUT CONTRAST TECHNIQUE: Contiguous axial images were obtained from the base of the skull through the vertex without intravenous contrast. RADIATION DOSE REDUCTION: This exam was performed according to the departmental dose-optimization program which includes automated exposure control, adjustment of the mA and/or kV according to patient size and/or use of iterative reconstruction technique. COMPARISON:  CT head 10/30/2021 FINDINGS: Image quality is degraded by motion artifact. Brain: There is no evidence of acute intracranial hemorrhage, extra-axial fluid collection, or acute infarct. Parenchymal volume is normal. The ventricles are normal in size. Gray-white differentiation is preserved. There is no mass lesion.  There is no mass effect or midline shift. Vascular: No hyperdense vessel or unexpected calcification. Skull: Normal. Negative for fracture or focal lesion. Sinuses/Orbits: The paranasal sinuses are clear.  The globes and orbits are unremarkable. Other: None. IMPRESSION: Motion degraded study. Within this confine, no definite evidence of acute intracranial pathology. Electronically Signed   By: Valetta Mole M.D.   On: 03/03/2022 14:03   DG Chest 2 View  Result Date: 03/03/2022 CLINICAL DATA:  Rule out postop pneumonia EXAM: CHEST - 2 VIEW COMPARISON:  Chest x-ray dated November 03, 2021 FINDINGS: Cardiac and mediastinal contours within normal limits for AP technique. Mild bibasilar opacities, likely due to atelectasis. Pneumoperitoneum. No pleural effusion. No evidence of pneumothorax. Small amount of soft tissue gas seen at the right neck base. IMPRESSION: 1. Mild bibasilar opacities, likely due to atelectasis. 2. Pneumoperitoneum, likely postoperative. Electronically Signed   By: Yetta Glassman M.D.   On: 03/03/2022 14:01      Subjective: Patient still significantly improved today.  Headache has resolved.  Nausea and vomiting has improved.  He is having bowel movements.  Discharge Exam: Vitals:   03/08/22 2228 03/09/22 0006 03/09/22 0532 03/09/22 0813  BP: (!) 169/105 (!) 145/101 (!) 165/103 (!) 160/89  Pulse: (!) 107 (!) 110 89 85  Resp:    16  Temp: 98.8 F (37.1 C) 98.5 F (36.9 C) 98.4 F (36.9 C) 97.9 F (36.6 C)  TempSrc: Oral Oral Oral Oral  SpO2: 98% 97% 100% 97%  Weight:      Height:        General: Pt is alert, awake, not in acute distress Cardiovascular:  RRR, S1/S2 +, no rubs, no gallops Respiratory: CTA bilaterally, no wheezing, no rhonchi Abdominal: Soft, NT, ND, bowel sounds + Extremities: no edema, no cyanosis    The results of significant diagnostics from this hospitalization (including imaging, microbiology, ancillary and laboratory) are listed below for reference.     Microbiology: Recent Results (from the past 240 hour(s))  Blood culture (routine x 2)     Status: None   Collection Time: 03/03/22 12:15 PM   Specimen: BLOOD RIGHT FOREARM  Result Value Ref  Range Status   Specimen Description   Final    BLOOD RIGHT FOREARM BLOOD Performed at [facility], [address] [place], [place] [address]    Special Requests   Final    Blood Culture adequate volume BOTTLES DRAWN AEROBIC AND ANAEROBIC Performed at [facility], [address] [place], [place] [address]    Culture   Final    NO GROWTH 5 DAYS Performed at [facility], [place] [address], [place], [place] [address]    Report Status 03/08/2022 FINAL  Final  Blood culture (routine x 2)     Status: None   Collection Time: 03/03/22  7:38 PM   Specimen: BLOOD RIGHT ARM  Result Value Ref Range Status   Specimen Description BLOOD RIGHT ARM  Final   Special Requests   Final    BOTTLES DRAWN AEROBIC AND ANAEROBIC Blood Culture results may not be optimal due to an excessive volume of blood received in culture bottles   Culture   Final    NO GROWTH 5 DAYS Performed at [facility], [place] [address] [place], [place] [address]    Report Status 03/08/2022 FINAL  Final     Labs: BNP (last 3 results) Recent Labs    11/01/21 0528  BNP [id number]*   Basic Metabolic Panel: Recent Labs  Lab 03/03/22 1203 03/03/22 1936 03/04/22 0307 03/06/22 0352 03/07/22 0256 03/09/22 0601  NA 135  --  140 141 138 136  K 3.1*  --  3.7 3.9 3.8 3.3*  CL 103  --  111 112* 111 108  CO2 22  --  20* 22 22 21*  GLUCOSE 137*  --  117* 113* 115* 108*  BUN 22  --  16 13 17 16   CREATININE 0.87 0.92 0.80 0.92 0.93 0.93  CALCIUM 9.1  --  9.1 9.6 9.1 9.4  MG  --   --   --  1.7 1.7  --   PHOS  --   --   --  2.2* 3.0  --    Liver Function Tests: Recent Labs  Lab 03/03/22 1203 03/04/22 0307 03/06/22 0352 03/07/22 0256 03/09/22 0601  AST 20 14*  --  11* 16  ALT 12 10  --  10 11  ALKPHOS 52 40  --  42 46  BILITOT 1.0 1.0  --  1.4* 1.0  PROT 7.3 5.7*  --  5.6* 6.4*  ALBUMIN 4.3 3.5 3.6 3.4* 3.9   No results for input(s): "LIPASE", "AMYLASE" in the last 168 hours. Recent Labs   Lab 03/03/22 1203  AMMONIA 21   CBC: Recent Labs  Lab 03/03/22 1203 03/03/22 1936 03/04/22 0307 03/06/22 0352 03/06/22 1106  WBC 14.7* 16.3* 13.9* 9.4 6.7  NEUTROABS  --   --   --  6.2  --   HGB 14.8 14.9 13.0 14.5 15.2  HCT 41.9 41.6 37.0* 40.9 44.1  MCV 85.5 85.8  86.7 85.6 87.3  PLT 268 261 228 261 273   Cardiac Enzymes: No results for input(s): "CKTOTAL", "CKMB", "CKMBINDEX", "TROPONINI" in the last 168 hours. BNP: Invalid input(s): "POCBNP" CBG: Recent Labs  Lab 03/04/22 1238  GLUCAP 111*   D-Dimer No results for input(s): "DDIMER" in the last 72 hours. Hgb A1c No results for input(s): "HGBA1C" in the last 72 hours. Lipid Profile No results for input(s): "CHOL", "HDL", "LDLCALC", "TRIG", "CHOLHDL", "LDLDIRECT" in the last 72 hours. Thyroid function studies No results for input(s): "TSH", "T4TOTAL", "T3FREE", "THYROIDAB" in the last 72 hours.  Invalid input(s): "FREET3" Anemia work up No results for input(s): "VITAMINB12", "FOLATE", "FERRITIN", "TIBC", "IRON", "RETICCTPCT" in the last 72 hours. Urinalysis    Component Value Date/Time   COLORURINE YELLOW 03/03/2022 1410   APPEARANCEUR CLEAR 03/03/2022 1410   LABSPEC 1.015 03/03/2022 1410   PHURINE 7.0 03/03/2022 1410   GLUCOSEU NEGATIVE 03/03/2022 1410   GLUCOSEU NEGATIVE 10/24/2010 0817   HGBUR MODERATE (A) 03/03/2022 1410   BILIRUBINUR NEGATIVE 03/03/2022 1410   KETONESUR 40 (A) 03/03/2022 1410   PROTEINUR NEGATIVE 03/03/2022 1410   UROBILINOGEN 0.2 10/24/2010 0817   NITRITE NEGATIVE 03/03/2022 1410   LEUKOCYTESUR NEGATIVE 03/03/2022 1410   Sepsis Labs Recent Labs  Lab 03/03/22 1936 03/04/22 0307 03/06/22 0352 03/06/22 1106  WBC 16.3* 13.9* 9.4 6.7   Microbiology Recent Results (from the past 240 hour(s))  Blood culture (routine x 2)     Status: None   Collection Time: 03/03/22 12:15 PM   Specimen: BLOOD RIGHT FOREARM  Result Value Ref Range Status   Specimen Description   Final    BLOOD  RIGHT FOREARM BLOOD Performed at [facility], [address] [place], [place] [address]    Special Requests   Final    Blood Culture adequate volume BOTTLES DRAWN AEROBIC AND ANAEROBIC Performed at [facility], [address] [place], [place] [address]    Culture   Final    NO GROWTH 5 DAYS Performed at [facility], [place] [address], [place], [place] [address]    Report Status 03/08/2022 FINAL  Final  Blood culture (routine x 2)     Status: None   Collection Time: 03/03/22  7:38 PM   Specimen: BLOOD RIGHT ARM  Result Value Ref Range Status   Specimen Description BLOOD RIGHT ARM  Final   Special Requests   Final    BOTTLES DRAWN AEROBIC AND ANAEROBIC Blood Culture results may not be optimal due to an excessive volume of blood received in culture bottles   Culture   Final    NO GROWTH 5 DAYS Performed at [facility], [place] [address], [place], [place] [address]    Report Status 03/08/2022 FINAL  Final     Time coordinating discharge: 63mins  SIGNED:   Kathie Dike, MD  [facility] 03/09/2022, 7:36 PM   If 7PM-7AM, please contact night-coverage [email or web address]

## 2022-03-28 ENCOUNTER — Ambulatory Visit: Payer: Medicare Other | Admitting: Adult Health

## 2022-04-13 NOTE — Progress Notes (Signed)
[facility] [address] [place]. [place] [address] [phone]       OFFICE FLLOW UP VISIT NOTE  [name] Date of Birth:  Jun 06, 1955 Medical Record Number:  [id number]   Referring MD: Joycelyn Das  Reason for Referral: Seizure  Chief Complaint  Patient presents with   Follow-up    Rm 3 with spouse Darl Pikes Pt is well and stable, no sz since last visit      HPI:   Initial visit 07/08/2020 Dr. Pearlean Brownie: [name] is a 66 year old Caucasian male seen today for initial office consultation visit for seizure.  History is obtained from the patient and review of electronic medical records and I have personally reviewed pertinent imaging films in PACS.  He has a past medical history of anxiety and bipolar disorder, migraines, hypertension, panic attacks and substance abuse.  He was seen in the ER on 05/04/2020 when he developed an episode in which he stated he his eyes deviated tonically to the left and it was uncomfortable he tried to look back across and walk and stumbled and fell and had a severe concussion with frontal and periorbital swelling with brief loss of consciousness.  Some confusion after that but gradually improved by the time he came to the ER.  On inquiry he and his wife admitted he had had for the last few well several episodes in which he felt subjectively his eyes were looking in different directions and he could not control them.  During these episodes her wife noticed that he would stare into the distance and make abnormal breathing type sound.  Episodes of fairly stereotypical occurring several times a year.  He would occasionally rapidly blink during these episodes.  Patient has a history of bipolar disorder and was on lamotrigine and lithium and had apparently stopped this medication a month prior to the last episode in January.  Is lithium and lamotrigine levels were undetectable on 05/05/2020.  EEG was normal.  MRI scan of the brain showed no  acute brain abnormality.  There are mild changes of chronic small vessel disease.  Incidental 1.2 cm midline posterior nasopharynx cyst was noted.  Right frontoparietal and periorbital contusion was also noted.  Patient also has history of migraine headaches and he takes Topamax 200 mg daily for prophylaxis it seems to have worked quite well and migraines in the down to once every couple of months.  He was recently seen by psychiatrist to seem quite content with his current medication for bipolar and did not suggest any changes.  Patient has no new complaints today.  He has no prior history of seizures, strokes, TIAs, significant head injury with loss of consciousness except the most recent one.  No childhood history of febrile seizures family history of epilepsy. Update 10/28/2020 Dr. Pearlean Brownie; He returns for follow-up after last visit 3 months ago.  Is accompanied by his wife.  He is doing well and has not had any generalized seizures or staring episodes.  He remains on Lamictal 200 mg daily she is tolerating well without side effects.  He is also on Topamax 200 mg daily and his migraines are quite well controlled and occur only once every couple of months.  He is a decreased appetite and has been losing weight.  Patient continues to have mild short-term memory difficulties but these are not progressive.  He states his bipolar symptoms are quite stable and he has regular follow-up with his psychiatrist.  He had follow-up EEG on 08/05/2020  which was also normal.  He has no new complaints today.   Update 02/10/2021 JM: Returns for 8743-month seizure follow-up.  Overall stable without any reoccurring seizure events.  Remains on lamotrigine (prior 200mg  daily but per pt, was changed to either 100mg  BID or 150mg  BID by Dr. Pearlean BrownieSethi - unable to verify this via epic).  No side effects.  Remains on topiramate 100 mg daily for migraine prophylaxis. No migraine occurrence since decreasing dosage from 200 mg to 100 mg and does  report excellent improvement of his memory - still not "perfect" but greatly improved.  Routinely followed by psychiatry and PCP.  No concerns today.    Update 07/13/2021 JM: Patient being seen for recent hospital follow-up accompanied by his wife.  He presented to ED on 07/05/2021 with confusion and garbled speech and some aphasia and then separate episode shortly after consisting of staring off, not responding, and stumbled in hallway at home.  He received TNK due to stroke concern, MRI and CTA unremarkable.  EEG showed epileptogenicity and cortical dysfunction arising from left frontotemporal region.  Episode initially concerning for stroke but after work-up, more concerning for seizure therefore increased lamotrigine to 200 mg twice daily.  Topiramate discontinued.  He was discharged home on 3/9.  Since discharge, he has been doing well. Does have some fatigue especially after increased exertion.  He has had no additional seizure activity and tolerating higher dose of lamotrigine without side effects. Wife mentions frequent panic attacks, closely followed by psychiatry. He will use clonazepam occasionally for panic attacks but does report increased fatigue after taking.  He has not had any migraines or headaches since discontinuing topiramate.   EEG 07/06/2021- epileptogenicity and cortical dysfunction arising from left frontotemporal region. Additionally there is mild diffuse encephalopathy, nonspecific etiology. No seizures were seen throughout the recording.    Update 11/21/2021 JM: Patient returns for 2934-month seizure follow-up accompanied by his wife  Evaluated in ED 7/2 with 1 to 2-week progression of confusion, falling and weakness likely in setting of toxic metabolic encephalopathy due to polypharmacy and lithium OD. Per note review, psychiatrist made adjustments to medication regimen with decreasing lamotrigine dosage and adding Depakote and Seroquel in addition to lithium and as needed Sonata  and Klonopin.  There was concern of unintentional overdosing with these multiple medications.  CT head no acute findings.  Medication adjustments made.  Depakote and Seroquel discontinued as well as topiramate (although this had been previously d/c'd).  Lithium levels normalized and mental status gradually returned to baseline.  Evaluated by psychiatry and neurology.  Discharged on lamotrigine 200 mg twice daily (prior dosage for seizure prevention). Advised outpatient neuro f/u as well as PCP/psychiatry follow-up.   He has been stable since discharge but still feels a generalized weakness. Denies any recent seizure activity.  Currently taking lamotrigine 200 mg twice daily without side effects.   He has not had any recent migraine headaches, he is not currently on any prophylactic medications  Previously being followed by a psychiatrist in [place] with in person visits 3-4 times per year otherwise completed via virtual visits. Due to recent event, they are now in the process of changing psychiatrist and is scheduled to see Dr. Renaldo FiddlerPashayan on 7/31.  Wife has concerns regarding medications that were prescribed by psychiatry as well as by this office as there was duplicate medications (psychiatry was also prescribing lamotrigine).   Of note, wife is also concerned regarding reported history of substance abuse on patient's chart most notably use  of cocaine.  He did have positive UDS for cocaine metabolites back in 2009 and 2011 but he denies any active use during that time.  Recent UDS negative for cocaine use.  He does admit to occasional THC use but no other drug use. His recent UDS was positive for opiates and per wife, she gave him a half tab of oxycodone (which was from an old prescription 2 years ago) due to severe lower abdominal pain which was eventually found to have enlarged prostate, he is scheduled to see urology next month.  He does not routinely take any type of opiates.  No further concerns at  this time   Update 04/17/2022 JM: Patient returns for follow-up visit after prior visit 5 months ago accompanied by his wife.  Stable since prior visit.  Denies any reoccurring seizure activity.  Remains on lamotrigine 200 mg twice daily.  He has not had any recent migraine headaches.  He has since establish care with behavioral health in this area.  He underwent bilateral hernia repair surgery on 11/1 and presented to ED on 11/3 with fever, abdominal pain, AMS, N/V and headache.  Found to have pneumoperitoneum likely postop complication.  Evaluated by neurology as initial concern for seizures, EEG negative for seizures. CT head completed for headache which was negative.  Received migraine cocktail.  He returned home after 6-day stay.  Feels he is completely back to baseline and is closely followed by general surgery.    ROS:   14 system review of systems is positive for those listed in HPI and all other systems negative  PMH:  Past Medical History:  Diagnosis Date   Anxiety    on meds   AVM (arteriovenous malformation) of colon 02/04/2021   Bipolar I disorder, most recent episode (or current) mixed, moderate    GERD (gastroesophageal reflux disease)    Hypertension    on meds   Migraines    OSA (obstructive sleep apnea)    retested-bipap not needed   Panic attacks    Seizures (HCC)    last known 05/2020;   Substance abuse (HCC)     Social History:  Social History   Socioeconomic History   Marital status: Married    Spouse name: susan   Number of children: Not on file   Years of education: Not on file   Highest education level: Not on file  Occupational History   Occupation: Disability    Comment: Bipolar disorder  Tobacco Use   Smoking status: Former    Types: Pipe, Cigars   Smokeless tobacco: Never  Vaping Use   Vaping Use: Never used  Substance and Sexual Activity   Alcohol use: No   Drug use: No    Types: Cocaine    Comment: past history of use-   Sexual  activity: Not on file  Other Topics Concern   Not on file  Social History Narrative   Lives with wife   Right Handed   Drinks 3-4 cups caffeine daily      Social Determinants of Health   Financial Resource Strain: Not on file  Food Insecurity: Not on file  Transportation Needs: No Transportation Needs (03/03/2022)   PRAPARE - Administrator, Civil Service (Medical): No    Lack of Transportation (Non-Medical): No  Physical Activity: Not on file  Stress: Not on file  Social Connections: Not on file  Intimate Partner Violence: Unknown (03/03/2022)   Humiliation, Afraid, Rape, and Kick questionnaire  Fear of Current or Ex-Partner: Patient refused    Emotionally Abused: Patient refused    Physically Abused: Patient refused    Sexually Abused: Patient refused    Medications:   Current Outpatient Medications on File Prior to Visit  Medication Sig Dispense Refill   amLODipine (NORVASC) 10 MG tablet Take 1 tablet (10 mg total) by mouth daily. 30 tablet 0   butalbital-acetaminophen-caffeine (FIORICET) 50-325-40 MG tablet Take 1-2 tablets by mouth every 6 (six) hours as needed for headache. 20 tablet 0   Calcium Carbonate Antacid (TUMS PO) Take 1 tablet by mouth daily as needed (heartburn).     carvedilol (COREG) 6.25 MG tablet Take 1 tablet (6.25 mg total) by mouth 2 (two) times daily with a meal. 60 tablet 0   Cholecalciferol (VITAMIN D3) 250 MCG (10000 UT) capsule Take 10,000 Units by mouth once a week.     clonazePAM (KLONOPIN) 1 MG tablet Take 1 tablet (1 mg total) by mouth daily as needed (for severe anxiety or agitation). 5 tablet 0   HYDROcodone-acetaminophen (NORCO) 7.5-325 MG tablet Take 1 tablet by mouth every 6 (six) hours as needed for moderate pain. Takes as needed - this is from an old rx.     lamoTRIgine (LAMICTAL) 200 MG tablet Take 1 tablet (200 mg total) by mouth 2 (two) times daily. 60 tablet 3   methocarbamol (ROBAXIN) 500 MG tablet Take 1 tablet (500 mg  total) by mouth every 6 (six) hours as needed for muscle spasms. 30 tablet 0   polyethylene glycol (MIRALAX / GLYCOLAX) 17 g packet Take 17 g by mouth daily. 14 each 0   zaleplon (SONATA) 10 MG capsule Take 1 capsule (10 mg total) by mouth at bedtime as needed for sleep. 30 capsule 0   No current facility-administered medications on file prior to visit.    Allergies:   Allergies  Allergen Reactions   Depakote [Divalproex Sodium] Nausea And Vomiting   Seroquel [Quetiapine Fumarate] Nausea And Vomiting   Sumatriptan Other (See Comments)     dizziness   Tramadol     Severe dizziness, restless leg, jittery    Physical Exam Today's Vitals   04/17/22 0933  BP: (!) 151/95  Pulse: 66  Weight: 159 lb (72.1 kg)  Height:  (1.778 m)    Body mass index is 22.81 kg/m.    General: well developed, well nourished very pleasant middle-aged Caucasian male, seated, in no evident distress Head: head normocephalic and atraumatic.   Neck: supple with no carotid or supraclavicular bruits Cardiovascular: regular rate and rhythm, no murmurs Musculoskeletal: no deformity Skin:  no rash/petichiae Vascular:  Normal pulses all extremities  Neurologic Exam Mental Status: Awake and fully alert.  Fluent speech and language.  Oriented to place and time. Recent and remote memory intact. Attention span, concentration and fund of knowledge appropriate. Mood and affect appropriate.  Cranial Nerves: Pupils equal, briskly reactive to light. Extraocular movements full without nystagmus. Visual fields full to confrontation. Hearing intact. Facial sensation intact. Face, tongue, palate moves normally and symmetrically.  Motor: Normal bulk and tone. Normal strength in all tested extremity muscles. Sensory.: intact to touch , pinprick , position and vibratory sensation.  Coordination: Rapid alternating movements normal in all extremities. Finger-to-nose and heel-to-shin performed accurately bilaterally. Gait  and Station: Arises from chair without difficulty. Stance is normal. Gait demonstrates normal stride length and balance without use of assistive device. Able to heel, toe and tandem walk with mild difficulty.  Reflexes: 1+  and symmetric. Toes downgoing.       ASSESSMENT: 66 year old Caucasian male with recurrent stereotypical episodes likely complex partial seizures.  In January 2022 he had severe tonic deviation of his eyes to the left, fell and developed a contusion with brief loss of consciousness.  He had been on lamotrigine and lithium for bipolar disorder which he had discontinued a month prior which may have triggered a more severe episode.  Recurrent seizure mimicking stroke 07/05/2021 (see HPI).  Recent hospitalization 7/2 for toxic encephalopathy most likely from polypharmacy and lithium OD (unintentionally)    PLAN:  -Continue lamotrigine 200 mg twice daily-refill provided.  -Lamotrigine level 1.6 (03/2022) - likely on low side as this was obtained during recent admission for post op complications of hernia repair (see HPI).  Prior level 10/2021 9.1 -EEG 11/2021 normal, no focal lateralizing or epileptiform features -EEG 06/2021 epileptogenicity and cortical dysfunction arising from left frontal temporal region -avoid seizure provoking stimuli like medication noncompliance, sleep deprivation and extremes of activity.   -Advised to call office with any seizure activity  -Migraines stable, no indication for prophylactic therapy    Follow-up in 8 months or call earlier if needed    CC:  Soundra Pilon, FNP   I spent 31 minutes of face-to-face and non-face-to-face time with patient and wife.  This included previsit chart review, lab review, study review, order entry, electronic health record documentation, patient education and discussion regarding above diagnoses and treatment plan and answered all the questions to patient wife satisfaction   Ihor Austin, AGNP-BC  [facility] [address] [place], [place] [address]  Phone [phone] Fax [phone] Note: This document was prepared with digital dictation and possible smart phrase technology. Any transcriptional errors that result from this process are unintentional.

## 2022-04-17 ENCOUNTER — Ambulatory Visit (INDEPENDENT_AMBULATORY_CARE_PROVIDER_SITE_OTHER): Payer: Medicare Other | Admitting: Adult Health

## 2022-04-17 ENCOUNTER — Encounter: Payer: Self-pay | Admitting: Adult Health

## 2022-04-17 DIAGNOSIS — G40009 Localization-related (focal) (partial) idiopathic epilepsy and epileptic syndromes with seizures of localized onset, not intractable, without status epilepticus: Secondary | ICD-10-CM | POA: Diagnosis not present

## 2022-04-17 MED ORDER — LAMOTRIGINE 200 MG PO TABS: 200.0000 mg | ORAL_TABLET | Freq: Two times a day (BID) | ORAL | 3 refills | Status: DC

## 2022-04-17 NOTE — Patient Instructions (Signed)
Your Plan:  Continue lamotrigine 200 mg twice daily for seizure prevention  Please call with any reoccurring seizure activity    Follow-up in 8 months or call earlier if needed     Thank you for coming to see [place] at [facility]. I hope we have been able to provide you high quality care today.  You may receive a patient satisfaction survey over the next few weeks. We would appreciate your feedback and comments so that we may continue to improve ourselves and the health of our patients.

## 2022-05-10 DIAGNOSIS — I10 Essential (primary) hypertension: Secondary | ICD-10-CM | POA: Diagnosis not present

## 2022-05-10 DIAGNOSIS — R519 Headache, unspecified: Secondary | ICD-10-CM | POA: Diagnosis not present

## 2022-05-10 DIAGNOSIS — R42 Dizziness and giddiness: Secondary | ICD-10-CM | POA: Diagnosis not present

## 2022-05-10 DIAGNOSIS — N1831 Chronic kidney disease, stage 3a: Secondary | ICD-10-CM | POA: Diagnosis not present

## 2022-05-10 DIAGNOSIS — Z20822 Contact with and (suspected) exposure to covid-19: Secondary | ICD-10-CM | POA: Diagnosis not present

## 2022-05-10 DIAGNOSIS — R112 Nausea with vomiting, unspecified: Secondary | ICD-10-CM | POA: Diagnosis not present

## 2022-05-23 ENCOUNTER — Ambulatory Visit (HOSPITAL_COMMUNITY): Payer: Medicare Other | Admitting: Student in an Organized Health Care Education/Training Program

## 2022-05-24 ENCOUNTER — Ambulatory Visit (HOSPITAL_COMMUNITY): Payer: Medicare Other | Admitting: Student in an Organized Health Care Education/Training Program

## 2022-06-01 ENCOUNTER — Encounter (HOSPITAL_COMMUNITY): Payer: Self-pay | Admitting: Student in an Organized Health Care Education/Training Program

## 2022-06-01 ENCOUNTER — Ambulatory Visit (HOSPITAL_BASED_OUTPATIENT_CLINIC_OR_DEPARTMENT_OTHER): Payer: Medicare Other | Admitting: Student in an Organized Health Care Education/Training Program

## 2022-06-01 VITALS — BP 140/84 | HR 60 | Ht 70.0 in | Wt 157.6 lb

## 2022-06-01 DIAGNOSIS — F3170 Bipolar disorder, currently in remission, most recent episode unspecified: Secondary | ICD-10-CM | POA: Diagnosis not present

## 2022-06-01 NOTE — Progress Notes (Signed)
BH MD/PA/NP OP Progress Note  06/01/2022 2:02 PM Matthew [name]  MRN:  [id number]  Chief Complaint:  Chief Complaint  Patient presents with   Follow-up   HPI:  Dr. Jasmine Awe was present during portions of the interview and ensured that the patient had no other questions or concerns.   Matthew [name] is a 67 yr old male who presents for Follow Up and Medication Management.  PPHx is significant for Bipolar Disorder, no Suicide Attempts or Self Injurious Behavior, and 1 prior Hospitalization (approx 30 yrs ago).   He reports that he is doing good today.  When asked how his hernia repair surgery when he reports that it did not go well.  He reports that after the surgery he went home but then began being nauseous and vomiting and having significant pain.  He reports that he went to an urgent care and then was sent to the hospital.  He reports that at first there was concern for seizure, however, during the course of the workup it was found he had a collapsed lung and other organs.  He reports that he was explained to him that during the course of the surgery there was some puncture in an organ somewhere in the CO2 use during the laparoscopic procedure leaked into his thoracic cavity which caused all the issues.  He reports that now he is doing much better.  He reports that through all this his mood remained relatively stable.  He reports no side effects with his Lamictal.  Discussed with him that we would not recommend any medication changes at this time and he was agreeable with this.  He reports no SI, HI, or AVH.  He reports his sleep is fair.  He reports his appetite is fair.  He reports no other concerns at present.  He will return for follow-up in approximately 3 months.   Visit Diagnosis:    ICD-10-CM   1. Bipolar I disorder in remission (HCC)  F31.70       Past Psychiatric History: Bipolar Disorder, no Suicide Attempts or Self Injurious Behavior, and 1 prior Hospitalization (approx  30 yrs ago).   Past Medical History:  Past Medical History:  Diagnosis Date   Anxiety    on meds   AVM (arteriovenous malformation) of colon 02/04/2021   Bipolar I disorder, most recent episode (or current) mixed, moderate    GERD (gastroesophageal reflux disease)    Hypertension    on meds   Migraines    OSA (obstructive sleep apnea)    retested-bipap not needed   Panic attacks    Seizures (HCC)    last known 05/2020;   Substance abuse (HCC)     Past Surgical History:  Procedure Laterality Date   CARPOMETACARPEL (CMC) FUSION OF THUMB WITH AUTOGRAFT FROM RADIUS Bilateral    COLONOSCOPY  2011   [facility] pt at [facility]-   FINGER ARTHROPLASTY Left 06/17/2012   Procedure: FINGER ARTHROPLASTY;  Surgeon: Jodi Marble, MD;  Location: [facility];  Service: Orthopedics;  Laterality: Left;  left thumb suspension arthroplasty    HEMORRHOID SURGERY  2000   Dr Earlene Plater   HERNIA REPAIR Bilateral 03/2022   TONSILLECTOMY     WISDOM TOOTH EXTRACTION      Family Psychiatric History: Mother- Depression Niece- Depression/Bipolar, 1 Suicide Attempt Cousin- Substance Abuse ([place])  Family History:  Family History  Problem Relation Age of Onset   Depression Mother    Hypertension Father    Cancer  Father 1       brain tumor   Heart disease Paternal Grandmother    Arthritis Other    Hypertension Other    Colon polyps Neg Hx    Colon cancer Neg Hx    Esophageal cancer Neg Hx    Rectal cancer Neg Hx    Stomach cancer Neg Hx     Social History:  Social History   Socioeconomic History   Marital status: Married    Spouse name: susan   Number of children: Not on file   Years of education: Not on file   Highest education level: Not on file  Occupational History   Occupation: Disability    Comment: Bipolar disorder  Tobacco Use   Smoking status: Former    Types: Pipe, Cigars   Smokeless tobacco: Never  Vaping Use   Vaping Use: Never used  Substance and Sexual Activity    Alcohol use: No   Drug use: No    Types: Cocaine    Comment: past history of use-   Sexual activity: Not on file  Other Topics Concern   Not on file  Social History Narrative   Lives with wife   Right Handed   Drinks 3-4 cups caffeine daily      Social Determinants of Health   Financial Resource Strain: Not on file  Food Insecurity: Not on file  Transportation Needs: No Transportation Needs (03/03/2022)   PRAPARE - Hydrologist (Medical): No    Lack of Transportation (Non-Medical): No  Physical Activity: Not on file  Stress: Not on file  Social Connections: Not on file    Allergies:  Allergies  Allergen Reactions   Depakote [Divalproex Sodium] Nausea And Vomiting   Seroquel [Quetiapine Fumarate] Nausea And Vomiting   Sumatriptan Other (See Comments)     dizziness   Tramadol     Severe dizziness, restless leg, jittery    Metabolic Disorder Labs: Lab Results  Component Value Date   HGBA1C 4.6 (L) 07/07/2021   MPG 85.32 07/07/2021   No results found for: "PROLACTIN" Lab Results  Component Value Date   CHOL 122 07/07/2021   TRIG 67 07/07/2021   HDL 34 (L) 07/07/2021   CHOLHDL 3.6 07/07/2021   VLDL 13 07/07/2021   LDLCALC 75 07/07/2021   LDLCALC 90 10/24/2010   Lab Results  Component Value Date   TSH 1.088 10/31/2021   TSH 1.207 10/31/2021    Therapeutic Level Labs: Lab Results  Component Value Date   LITHIUM 0.33 (L) 11/04/2021   LITHIUM 0.50 (L) 11/03/2021   Lab Results  Component Value Date   VALPROATE 42 (L) 10/31/2021   VALPROATE 61.0 10/24/2010   No results found for: "CBMZ"  Current Medications: Current Outpatient Medications  Medication Sig Dispense Refill   amLODipine (NORVASC) 10 MG tablet Take 1 tablet (10 mg total) by mouth daily. 30 tablet 0   butalbital-acetaminophen-caffeine (FIORICET) 50-325-40 MG tablet Take 1-2 tablets by mouth every 6 (six) hours as needed for headache. 20 tablet 0   Calcium  Carbonate Antacid (TUMS PO) Take 1 tablet by mouth daily as needed (heartburn).     carvedilol (COREG) 6.25 MG tablet Take 1 tablet (6.25 mg total) by mouth 2 (two) times daily with a meal. 60 tablet 0   Cholecalciferol (VITAMIN D3) 250 MCG (10000 UT) capsule Take 10,000 Units by mouth once a week.     clonazePAM (KLONOPIN) 1 MG tablet Take 1 tablet (1 mg  total) by mouth daily as needed (for severe anxiety or agitation). 5 tablet 0   HYDROcodone-acetaminophen (NORCO) 7.5-325 MG tablet Take 1 tablet by mouth every 6 (six) hours as needed for moderate pain. Takes as needed - this is from an old rx.     lamoTRIgine (LAMICTAL) 200 MG tablet Take 1 tablet (200 mg total) by mouth 2 (two) times daily. 180 tablet 3   methocarbamol (ROBAXIN) 500 MG tablet Take 1 tablet (500 mg total) by mouth every 6 (six) hours as needed for muscle spasms. 30 tablet 0   polyethylene glycol (MIRALAX / GLYCOLAX) 17 g packet Take 17 g by mouth daily. 14 each 0   zaleplon (SONATA) 10 MG capsule Take 1 capsule (10 mg total) by mouth at bedtime as needed for sleep. 30 capsule 0   No current facility-administered medications for this visit.     Musculoskeletal: Strength & Muscle Tone: within normal limits Gait & Station: normal Patient leans: N/A  Psychiatric Specialty Exam: Review of Systems  Respiratory:  Negative for cough and shortness of breath.   Cardiovascular:  Negative for chest pain.  Gastrointestinal:  Negative for abdominal pain, constipation, diarrhea, nausea and vomiting.  Neurological:  Negative for weakness and headaches.  Psychiatric/Behavioral:  Negative for dysphoric mood, hallucinations, sleep disturbance and suicidal ideas. The patient is not nervous/anxious.     Blood pressure (!) 140/84, pulse 60, height 5\' 10"  (1.778 m), weight 157 lb 9.6 oz (71.5 kg), SpO2 99 %.Body mass index is 22.61 kg/m.  General Appearance: Casual and Fairly Groomed  Eye Contact:  Good  Speech:  Clear and Coherent and  Normal Rate  Volume:  Normal  Mood:  Euthymic  Affect:  Appropriate and Congruent  Thought Process:  Coherent and Goal Directed  Orientation:  Full (Time, Place, and Person)  Thought Content: WDL and Logical   Suicidal Thoughts:  No  Homicidal Thoughts:  No  Memory:  Immediate;   Good Recent;   Good  Judgement:  Good  Insight:  Good  Psychomotor Activity:  Normal  Concentration:  Concentration: Good and Attention Span: Good  Recall:  Good  Fund of Knowledge: Good  Language: Good  Akathisia:  Negative  Handed:  Right  AIMS (if indicated): not done  Assets:  Communication Skills Desire for Improvement Financial Resources/Insurance Housing Resilience Social Support  ADL's:  Intact  Cognition: WNL  Sleep:  Fair   Screenings: CAGE-AID    Flowsheet Row ED to Hosp-Admission (Discharged) from 07/05/2021 in [place] NEURO/TRAUMA/SURGICAL ICU  CAGE-AID Score 0      Flowsheet Row ED to Hosp-Admission (Discharged) from 03/03/2022 in [place] ED from 11/19/2021 in [facility] Emergency Department [facility] ED to Hosp-Admission (Discharged) from 07/05/2021 in [place] NEURO/TRAUMA/SURGICAL ICU  C-SSRS RISK CATEGORY No Risk No Risk No Risk        Assessment and Plan:  Matthew [name] is a 67 yr old male who presents for Follow Up and Medication Management.  PPHx is significant for Bipolar Disorder, no Suicide Attempts or Self Injurious Behavior, and 1 prior Hospitalization (approx 30 yrs ago).    Octavia Bruckner has continued to do well from a psychiatric standpoint.  Despite a very stressful situation due to complication from his hernia surgery his mood remained stable throughout.  His Lamictal continues to be managed by neurology.  We will not make any changes to his medications at this time.  He will return for follow-up in  approximately 3 months.   Bipolar Disorder, Full Remission: -Continue Lamictal 200 mg BID for Mood Stability and Seizure  Precaution.  Prescribed by Neurology. -Continue Klonopin 1 mg PRN daily for severe anxiety/agitation. No refills sent.    Collaboration of Care: Collaboration of Care: Other provider involved in patient's care [facility]  Patient/Guardian was advised Release of Information must be obtained prior to any record release in order to collaborate their care with an outside provider. Patient/Guardian was advised if they have not already done so to contact the registration department to sign all necessary forms in order for [place] to release information regarding their care.   Consent: Patient/Guardian gives verbal consent for treatment and assignment of benefits for services provided during this visit. Patient/Guardian expressed understanding and agreed to proceed.    Briant Cedar, MD 06/01/2022, 2:02 PM

## 2022-06-07 DIAGNOSIS — N1831 Chronic kidney disease, stage 3a: Secondary | ICD-10-CM | POA: Diagnosis not present

## 2022-06-07 DIAGNOSIS — I10 Essential (primary) hypertension: Secondary | ICD-10-CM | POA: Diagnosis not present

## 2022-07-28 ENCOUNTER — Encounter (HOSPITAL_BASED_OUTPATIENT_CLINIC_OR_DEPARTMENT_OTHER): Payer: Self-pay | Admitting: Emergency Medicine

## 2022-07-28 ENCOUNTER — Other Ambulatory Visit: Payer: Self-pay

## 2022-07-28 ENCOUNTER — Emergency Department (HOSPITAL_BASED_OUTPATIENT_CLINIC_OR_DEPARTMENT_OTHER)
Admission: EM | Admit: 2022-07-28 | Discharge: 2022-07-28 | Disposition: A | Discharge status: Home / Self Care | Payer: Medicare Other | Attending: Emergency Medicine | Admitting: Emergency Medicine

## 2022-07-28 ENCOUNTER — Emergency Department (HOSPITAL_BASED_OUTPATIENT_CLINIC_OR_DEPARTMENT_OTHER): Payer: Medicare Other

## 2022-07-28 DIAGNOSIS — W458XXA Other foreign body or object entering through skin, initial encounter: Secondary | ICD-10-CM | POA: Insufficient documentation

## 2022-07-28 DIAGNOSIS — S6992XA Unspecified injury of left wrist, hand and finger(s), initial encounter: Secondary | ICD-10-CM

## 2022-07-28 DIAGNOSIS — S6991XA Unspecified injury of right wrist, hand and finger(s), initial encounter: Secondary | ICD-10-CM | POA: Diagnosis present

## 2022-07-28 DIAGNOSIS — S60457A Superficial foreign body of left little finger, initial encounter: Secondary | ICD-10-CM | POA: Diagnosis not present

## 2022-07-28 DIAGNOSIS — S60453A Superficial foreign body of left middle finger, initial encounter: Secondary | ICD-10-CM | POA: Insufficient documentation

## 2022-07-28 MED ORDER — LIDOCAINE HCL 2 % IJ SOLN
5.0000 mL | Freq: Once | INTRAMUSCULAR | Status: AC
  Administered 2022-07-28: 100 mg via INTRADERMAL
  Filled 2022-07-28: qty 20

## 2022-07-28 NOTE — Discharge Instructions (Signed)
Please keep the finger clean and monitor for signs of infection including worsening pain, redness, swelling, pus draining from the finger or fevers.

## 2022-07-28 NOTE — ED Provider Notes (Signed)
[facility] [place] [facility] Provider Note   CSN: [id number] Arrival date & time: 07/28/22  1718     History  Chief Complaint  Patient presents with   Finger Injury    Matthew [name] is a 67 y.o. male.  Patient presents to the emergency department for embedded fishhook into left long finger.  Injury occurred about 2 hours prior to arrival.  Patient did try to remove the hook with a pair of pliers, but was in too much pain to proceed.  States that last tetanus has been within 10 years.  No other injuries.       Home Medications Prior to Admission medications   Medication Sig Start Date End Date Taking? Authorizing Provider  amLODipine (NORVASC) 10 MG tablet Take 1 tablet (10 mg total) by mouth daily. 11/05/21   Thurnell Lose, MD  butalbital-acetaminophen-caffeine (FIORICET) [phone] MG tablet Take 1-2 tablets by mouth every 6 (six) hours as needed for headache. 03/09/22 03/09/23  Kathie Dike, MD  Calcium Carbonate Antacid (TUMS PO) Take 1 tablet by mouth daily as needed (heartburn).    [provider]  carvedilol (COREG) 6.25 MG tablet Take 1 tablet (6.25 mg total) by mouth 2 (two) times daily with a meal. 03/09/22   Kathie Dike, MD  Cholecalciferol (VITAMIN D3) 250 MCG (10000 UT) capsule Take 10,000 Units by mouth once a week.    [provider]  clonazePAM (KLONOPIN) 1 MG tablet Take 1 tablet (1 mg total) by mouth daily as needed (for severe anxiety or agitation). 11/04/21   Thurnell Lose, MD  HYDROcodone-acetaminophen (NORCO) 7.5-325 MG tablet Take 1 tablet by mouth every 6 (six) hours as needed for moderate pain. Takes as needed - this is from an old rx.    [provider]  lamoTRIgine (LAMICTAL) 200 MG tablet Take 1 tablet (200 mg total) by mouth 2 (two) times daily. 04/17/22   Frann Rider, NP  methocarbamol (ROBAXIN) 500 MG tablet Take 1 tablet (500 mg total) by mouth every 6 (six) hours as needed for  muscle spasms. 03/09/22   Kathie Dike, MD  polyethylene glycol (MIRALAX / GLYCOLAX) 17 g packet Take 17 g by mouth daily. 03/09/22   Kathie Dike, MD  zaleplon (SONATA) 10 MG capsule Take 1 capsule (10 mg total) by mouth at bedtime as needed for sleep. 02/20/22   Briant Cedar, MD      Allergies    Depakote [divalproex sodium], Seroquel [quetiapine fumarate], Sumatriptan, and Tramadol    Review of Systems   Review of Systems  Physical Exam Updated Vital Signs BP (!) 151/80 (BP Location: Right Arm)   Pulse 64   Temp 97.6 F (36.4 C) (Oral)   Resp 17   Ht 5\' 10"  (1.778 m)   Wt 67.1 kg   SpO2 99%   BMI 21.24 kg/m  Physical Exam Vitals and nursing note reviewed.  Constitutional:      Appearance: He is well-developed.  HENT:     Head: Normocephalic and atraumatic.  Eyes:     Conjunctiva/sclera: Conjunctivae normal.  Pulmonary:     Effort: No respiratory distress.  Musculoskeletal:     Cervical back: Normal range of motion and neck supple.     Comments: Fishing fly with barbed hook embedded in skin of left long digit. Minimal dried blood at site. No movement difficulty.   Skin:    General: Skin is warm and dry.  Neurological:     Mental  Status: He is alert.     ED Results / Procedures / Treatments   Labs (all labs ordered are listed, but only abnormal results are displayed) Labs Reviewed - No data to display  EKG None  Radiology No results found.  Procedures .Foreign Body Removal  Date/Time: 07/28/2022 6:28 PM  Performed by: Carlisle Cater, PA-C Authorized by: Carlisle Cater, PA-C  Consent: Verbal consent obtained. Consent given by: patient Patient identity confirmed: verbally with patient and provided demographic data Body area: skin General location: upper extremity Location details: left long finger Anesthesia: local infiltration  Anesthesia: Local Anesthetic: lidocaine 2% without epinephrine Anesthetic total: 2 mL  Sedation: Patient  sedated: no  Localization method: visualized Removal mechanism: hemostat Tendon involvement: none Depth: subcutaneous 1 objects recovered. Objects recovered: fishing fly Post-procedure assessment: foreign body removed Comments: After appropriate anesthesia, fishhook was grasped with hemostat and the barb and was delivered through the skin.  Matthew [name] was cut and then the fishhook was removed from the wound without difficulty.      Medications Ordered in ED Medications  lidocaine (XYLOCAINE) 2 % (with pres) injection 100 mg (100 mg Intradermal Given by Other 07/28/22 1806)    ED Course/ Medical Decision Making/ A&P    Patient seen and examined. History obtained directly from patient.   Labs/EKG: None ordered.  Imaging: X-ray ordered in triage personally reviewed and interpreted, foreign body noted, no fracture.  Medications/Fluids: Ordered: Lidocaine 2% without epinephrine  Most recent vital signs reviewed and are as follows: BP (!) 151/80 (BP Location: Right Arm)   Pulse 64   Temp 97.6 F (36.4 C) (Oral)   Resp 17   Ht 5\' 10"  (1.778 m)   Wt 67.1 kg   SpO2 99%   BMI 21.24 kg/m   Initial impression: Fishhook in finger  6:30 PM Reassessment performed. Patient appears stable.  Patient washed finger with soap and water after fishhook removal  Plan: Discharge to home.   Prescriptions written for: None  Other home care instructions discussed: Good wound care and close monitoring for infection  ED return instructions discussed: Pt urged to return with worsening pain, worsening swelling, expanding area of redness or streaking up extremity, fever, or any other concerns.   Follow-up instructions discussed: Patient encouraged to follow-up with their PCP as needed.                             Medical Decision Making Amount and/or Complexity of Data Reviewed Radiology: ordered.  Risk Prescription drug management.   Patient with facial committed and finger, shallow, no  bony or tendon involvement suspected.  Removed without complications.        Final Clinical Impression(s) / ED Diagnoses Final diagnoses:  Fish hook injury of left middle finger, initial encounter    Rx / DC Orders ED Discharge Orders     None         Carlisle Cater, PA-C 07/28/22 [place], [place], DO 07/28/22 1951

## 2022-07-28 NOTE — ED Triage Notes (Signed)
Pt has a barbed small fish hook in left middle finger

## 2022-08-30 ENCOUNTER — Ambulatory Visit (HOSPITAL_COMMUNITY): Payer: Medicare Other | Admitting: Student in an Organized Health Care Education/Training Program

## 2022-09-06 ENCOUNTER — Ambulatory Visit (HOSPITAL_COMMUNITY): Payer: Medicare Other | Admitting: Student in an Organized Health Care Education/Training Program

## 2022-09-06 ENCOUNTER — Encounter (HOSPITAL_COMMUNITY): Payer: Self-pay | Admitting: Student in an Organized Health Care Education/Training Program

## 2022-09-06 VITALS — BP 160/95 | HR 56 | Ht 70.0 in | Wt 155.2 lb

## 2022-09-06 DIAGNOSIS — F3170 Bipolar disorder, currently in remission, most recent episode unspecified: Secondary | ICD-10-CM

## 2022-09-06 DIAGNOSIS — F4321 Adjustment disorder with depressed mood: Secondary | ICD-10-CM | POA: Diagnosis not present

## 2022-09-06 DIAGNOSIS — G4700 Insomnia, unspecified: Secondary | ICD-10-CM | POA: Diagnosis not present

## 2022-09-06 DIAGNOSIS — Z636 Dependent relative needing care at home: Secondary | ICD-10-CM

## 2022-09-06 NOTE — Progress Notes (Addendum)
BH MD/PA/NP OP Progress Note  09/06/2022 11:26 AM Matthew [name]  MRN:  [id number]  Chief Complaint:  Chief Complaint  Patient presents with   Follow-up   Depression   HPI:  Matthew [name] is a 67 yr old male who presents for Follow Up and Medication Management.  PPHx is significant for Bipolar Disorder, no Suicide Attempts or Self Injurious Behavior, and 1 Psychiatric Hospitalization (approx 30 yrs ago).   He reports that for the most part he has been doing good since our last appointment.  He reports his mood has remained stable.  He reports no side effects to his Lamictal.  He then reported the last few days have been rough.  He reports that he has been under a lot of stress recently due to his family.  He reports that his mother who is 58 is having a lot of issues and has been getting worse and worse.  He reports that a little while ago she fell and broke her foot in multiple places and is now bedbound.  He reports that she is having issues with memory and her personality has significantly changed and she is much meaner and more irritable than she used to be.  He reports that in addition to this his wife's mother who is 45 is also doing poorly right now.  He reports that with all of this going on his wife has become very irritable and will often yell at him.  He reports that one of the things she is doing is it seems like she was angry at him being sick recently.  He reports he feels like he is struggling to support everyone.  He also reports when he was near [employer] college and had many job opportunities lined up his father became sick was found to have a brain tumor and he put everything "on pause" because they were told he would only live a month or so.  He reports that his father ended up living for almost 4-1/2 years and so by that time he could not restart his life where it had been in that he still grieves this loss.  Encouraged him to reach out to hospice/palliative care and reach  out to his mother's doctor to at least begin this conversation.  Discussed that he and his wife could find grief counseling and caregiver support groups through this.  Discussed with him that caregiver burnout is a significant issue that Medicare does have benefits for now.  Offered to start therapy and he thinks this would be a good idea.  Discussed with him that starting an antidepressant could be a possibility but at this time we would to start with therapy and he was agreeable with this.  He reports no SI, HI, or AVH.  He reports his sleep is poor.  He reports appetite is poor.  He reports no other concerns at present.  He will return to start therapy next week.  Discussed with him that his blood pressure was elevated today.  He reports that he has had a recent change in his blood pressure medicines because he has been having issues.  Encouraged him to follow up with his PCP and he reported he would.   Visit Diagnosis:    ICD-10-CM   1. Bipolar I disorder in remission (HCC)  F31.70     2. Insomnia, unspecified type  G47.00     3. Grief  F43.21     4. Caregiver stress  Z63.6  Past Psychiatric History: Bipolar Disorder, no Suicide Attempts or Self Injurious Behavior, and 1 Psychiatric Hospitalization (approx 30 yrs ago).   Past Medical History:  Past Medical History:  Diagnosis Date   Anxiety    on meds   AVM (arteriovenous malformation) of colon 02/04/2021   Bipolar I disorder, most recent episode (or current) mixed, moderate    GERD (gastroesophageal reflux disease)    Hypertension    on meds   Migraines    OSA (obstructive sleep apnea)    retested-bipap not needed   Panic attacks    Seizures (HCC)    last known 05/2020;   Substance abuse (HCC)     Past Surgical History:  Procedure Laterality Date   CARPOMETACARPEL (CMC) FUSION OF THUMB WITH AUTOGRAFT FROM RADIUS Bilateral    COLONOSCOPY  2011   [facility] at [facility]-   FINGER ARTHROPLASTY Left 06/17/2012   Procedure:  FINGER ARTHROPLASTY;  Surgeon: Jodi Marble, MD;  Location: [facility];  Service: Orthopedics;  Laterality: Left;  left thumb suspension arthroplasty    HEMORRHOID SURGERY  2000   Dr Earlene Plater   HERNIA REPAIR Bilateral 03/2022   TONSILLECTOMY     WISDOM TOOTH EXTRACTION      Family Psychiatric History: Mother- Depression Niece- Depression/Bipolar, 1 Suicide Attempt Cousin- Substance Abuse ([place])  Family History:  Family History  Problem Relation Age of Onset   Depression Mother    Hypertension Father    Cancer Father 49       brain tumor   Heart disease Paternal Grandmother    Arthritis Other    Hypertension Other    Colon polyps Neg Hx    Colon cancer Neg Hx    Esophageal cancer Neg Hx    Rectal cancer Neg Hx    Stomach cancer Neg Hx     Social History:  Social History   Socioeconomic History   Marital status: Married    Spouse name: susan   Number of children: Not on file   Years of education: Not on file   Highest education level: Not on file  Occupational History   Occupation: Disability    Comment: Bipolar disorder  Tobacco Use   Smoking status: Former    Types: Pipe, Cigars   Smokeless tobacco: Never  Vaping Use   Vaping Use: Never used  Substance and Sexual Activity   Alcohol use: No   Drug use: No    Types: Cocaine    Comment: past history of use-   Sexual activity: Not on file  Other Topics Concern   Not on file  Social History Narrative   Lives with wife   Right Handed   Drinks 3-4 cups caffeine daily      Social Determinants of Health   Financial Resource Strain: Not on file  Food Insecurity: Not on file  Transportation Needs: No Transportation Needs (03/03/2022)   PRAPARE - Administrator, Civil Service (Medical): No    Lack of Transportation (Non-Medical): No  Physical Activity: Not on file  Stress: Not on file  Social Connections: Not on file    Allergies:  Allergies  Allergen Reactions   Depakote  [Divalproex Sodium] Nausea And Vomiting   Seroquel [Quetiapine Fumarate] Nausea And Vomiting   Sumatriptan Other (See Comments)     dizziness   Tramadol     Severe dizziness, restless leg, jittery    Metabolic Disorder Labs: Lab Results  Component Value Date   HGBA1C 4.6 (  L) 07/07/2021   MPG 85.32 07/07/2021   No results found for: "PROLACTIN" Lab Results  Component Value Date   CHOL 122 07/07/2021   TRIG 67 07/07/2021   HDL 34 (L) 07/07/2021   CHOLHDL 3.6 07/07/2021   VLDL 13 07/07/2021   LDLCALC 75 07/07/2021   LDLCALC 90 10/24/2010   Lab Results  Component Value Date   TSH 1.088 10/31/2021   TSH 1.207 10/31/2021    Therapeutic Level Labs: Lab Results  Component Value Date   LITHIUM 0.33 (L) 11/04/2021   LITHIUM 0.50 (L) 11/03/2021   Lab Results  Component Value Date   VALPROATE 42 (L) 10/31/2021   VALPROATE 61.0 10/24/2010   No results found for: "CBMZ"  Current Medications: Current Outpatient Medications  Medication Sig Dispense Refill   amLODipine (NORVASC) 10 MG tablet Take 1 tablet (10 mg total) by mouth daily. 30 tablet 0   butalbital-acetaminophen-caffeine (FIORICET) 50-325-40 MG tablet Take 1-2 tablets by mouth every 6 (six) hours as needed for headache. 20 tablet 0   Calcium Carbonate Antacid (TUMS PO) Take 1 tablet by mouth daily as needed (heartburn).     carvedilol (COREG) 6.25 MG tablet Take 1 tablet (6.25 mg total) by mouth 2 (two) times daily with a meal. 60 tablet 0   Cholecalciferol (VITAMIN D3) 250 MCG (10000 UT) capsule Take 10,000 Units by mouth once a week.     clonazePAM (KLONOPIN) 1 MG tablet Take 1 tablet (1 mg total) by mouth daily as needed (for severe anxiety or agitation). 5 tablet 0   HYDROcodone-acetaminophen (NORCO) 7.5-325 MG tablet Take 1 tablet by mouth every 6 (six) hours as needed for moderate pain. Takes as needed - this is from an old rx.     lamoTRIgine (LAMICTAL) 200 MG tablet Take 1 tablet (200 mg total) by mouth 2 (two)  times daily. 180 tablet 3   methocarbamol (ROBAXIN) 500 MG tablet Take 1 tablet (500 mg total) by mouth every 6 (six) hours as needed for muscle spasms. 30 tablet 0   polyethylene glycol (MIRALAX / GLYCOLAX) 17 g packet Take 17 g by mouth daily. 14 each 0   zaleplon (SONATA) 10 MG capsule Take 1 capsule (10 mg total) by mouth at bedtime as needed for sleep. 30 capsule 0   No current facility-administered medications for this visit.     Musculoskeletal: Strength & Muscle Tone: within normal limits Gait & Station: normal Patient leans: N/A  Psychiatric Specialty Exam: Review of Systems  Respiratory:  Negative for cough and shortness of breath.   Cardiovascular:  Negative for chest pain.  Gastrointestinal:  Negative for abdominal pain, constipation, diarrhea, nausea and vomiting.  Neurological:  Negative for weakness and headaches.  Psychiatric/Behavioral:  Positive for dysphoric mood and sleep disturbance. Negative for hallucinations and suicidal ideas. The patient is nervous/anxious.     Blood pressure (!) 160/95, pulse (!) 56, height 5\' 10"  (1.778 m), weight 155 lb 3.2 oz (70.4 kg), SpO2 97 %.Body mass index is 22.27 kg/m.  General Appearance: Casual and Fairly Groomed  Eye Contact:  Good  Speech:  Clear and Coherent and Normal Rate  Volume:  Normal  Mood:  Depressed  Affect:  Congruent and Depressed  Thought Process:  Coherent and Goal Directed  Orientation:  Full (Time, Place, and Person)  Thought Content: WDL and Logical   Suicidal Thoughts:  No  Homicidal Thoughts:  No  Memory:  Immediate;   Good Recent;   Good  Judgement:  Good  Insight:  Good  Psychomotor Activity:  Normal  Concentration:  Concentration: Good and Attention Span: Good  Recall:  Good  Fund of Knowledge: Good  Language: Good  Akathisia:  Negative  Handed:  Right  AIMS (if indicated): not done  Assets:  Communication Skills Desire for Improvement Financial Resources/Insurance Resilience Social  Support  ADL's:  Intact  Cognition: WNL  Sleep:  Poor   Screenings: CAGE-AID    Flowsheet Row ED to Hosp-Admission (Discharged) from 07/05/2021 in [facility] 4NORTH NEURO/TRAUMA/SURGICAL ICU  CAGE-AID Score 0      Flowsheet Row ED from 07/28/2022 in [facility] Emergency Department at [facility] ED to Hosp-Admission (Discharged) from 03/03/2022 in [facility] 41M KIDNEY UNIT ED from 11/19/2021 in [facility] Emergency Department at [facility]  C-SSRS RISK CATEGORY No Risk No Risk No Risk        Assessment and Plan:  Matthew [name] is a 67 yr old male who presents for Follow Up and Medication Management.  PPHx is significant for Bipolar Disorder, no Suicide Attempts or Self Injurious Behavior, and 1 Psychiatric Hospitalization (approx 30 yrs ago).    Matthew [name] from a Bipolar standpoint has remained stable.  However he is under significant family stress.  Both his mother and mother-in-law are significantly ill and seem to be worsening.  His wife is also struggling under this and seems to be having issues with caregiver burnout and he appears to be having those issues as well.  Encouraged him to reach out to hospice/out of care and his mother's doctor to begin discussions with them.  He would benefit from talk therapy so we will begin this next week.  At this point we will not start an antidepressant however that may be necessary in the future.  He will return for follow-up next week.   Bipolar Disorder, Full Remission: -Continue Lamictal 200 mg BID for Mood Stability and Seizure Precaution.  Prescribed by Neurology. -Continue Klonopin 1 mg PRN daily for severe anxiety/agitation. No refills sent.    Collaboration of Care:   Patient/Guardian was advised Release of Information must be obtained prior to any record release in order to collaborate their care with an outside provider. Patient/Guardian was advised if they have not already done so to contact the registration  department to sign all necessary forms in order for [place] to release information regarding their care.   Consent: Patient/Guardian gives verbal consent for treatment and assignment of benefits for services provided during this visit. Patient/Guardian expressed understanding and agreed to proceed.    Lauro Franklin, MD 09/06/2022, 11:26 AM

## 2022-09-06 NOTE — Addendum Note (Signed)
Addended by: Everlena Cooper on: 09/06/2022 12:59 PM   Modules accepted: Level of Service

## 2022-09-14 ENCOUNTER — Ambulatory Visit (HOSPITAL_COMMUNITY): Payer: Medicare Other | Admitting: Student in an Organized Health Care Education/Training Program

## 2022-09-14 DIAGNOSIS — F4321 Adjustment disorder with depressed mood: Secondary | ICD-10-CM | POA: Diagnosis not present

## 2022-09-14 DIAGNOSIS — F32A Depression, unspecified: Secondary | ICD-10-CM | POA: Diagnosis not present

## 2022-09-14 NOTE — Progress Notes (Signed)
[facility] [address] [place] [address] [place] [address] Dept: [phone] Dept Fax: [phone]  Psychotherapy Progress Note  Patient ID: Matthew [name], male  DOB: 1956/01/12, 67 y.o.  MRN: [id number]  09/14/2022 Start time: 8:10 AM End time: 8:52 AM  Method of Visit: Face-to-Face  Present: patient  Current Concerns:  He reports that he feels like he is alone.  He reports feeling like use always had others around him but that they have been been growing apart.  He reports specifically with his wife.  He reports that she is so angry with him and will travel by herself as much as she is able to.  He reports about his relationship with his sister.  He reports she pays all of their mother's bills which is a lot but only spends about 10 minutes a month at their mother's house to write checks.  He reports they have never been close but never had anything bad specifically happened.  He reports that happened when they were young.  He was 13 and she was 16.  He reports that she was very smart and beautiful and so was very popular at school.  He reports that a 67 year old who told everyone that he was 83 married her and that her everything seemed to stem from that.  He reports that their dad never recovered from this.  He reports his dad found to her depression and then was fired from his job and shortly thereafter was diagnosed with a brain tumor.    He reports something similar happened with him.  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  He reports shortly after that he discovered his first wife was having an affair.  He reports that this just ruined him and eventually he was fired from [employer].  He reports due to all of this that is when he had his first seizure.  Prior to leaving the appointment he confirmed he was in a stable and safe mindset.  He reports no SI, HI, or AVH.   Current  Symptoms: Depressed Mood, Family Stress, and Peer problems  Psychiatric Specialty Exam: General Appearance: Casual and Fairly Groomed  Eye Contact:  Good  Speech:  Clear and Coherent and Normal Rate  Volume:  Normal  Mood:  Dysphoric  Affect:  Constricted and Tearful  Thought Process:  Coherent and Goal Directed  Orientation:  Full (Time, Place, and Person)  Thought Content:  WDL and Logical  Suicidal Thoughts:  No  Homicidal Thoughts:  No  Memory:  Immediate;   Good Recent;   Good  Judgement:  Good  Insight:  Good  Psychomotor Activity:  Normal  Concentration:  Concentration: Good and Attention Span: Good  Recall:  Good  Fund of Knowledge:Good  Language: Good  Akathisia:  Negative  Handed:  Right  AIMS (if indicated):  not done  Assets:  Communication Skills Desire for Improvement Financial Resources/Insurance Resilience  ADL's:  Intact  Cognition: WNL  Sleep:  Poor     Diagnosis: Depression, Grief  Anticipated Frequency of Visits: weekly Anticipated Length of Treatment Episode: 16  Short Term Goals/Goals for Treatment Session: Self Reflection Progress Towards Goals: Initial  Treatment Intervention: Supportive therapy  Medical Necessity: Prevented onset or worsening of patient condition  Assessment Tools:     No data to display         Failed to redirect to the Timeline version of  the REVFS SmartLink. Flowsheet Row ED from 07/28/2022 in [facility] Emergency Department at [facility] ED to Hosp-Admission (Discharged) from 03/03/2022 in [facility] 15M KIDNEY UNIT ED from 11/19/2021 in [facility] Emergency Department at [facility]  C-SSRS RISK CATEGORY No Risk No Risk No Risk       Collaboration of Care:   Patient/Guardian was advised Release of Information must be obtained prior to any record release in order to collaborate their care with an outside provider. Patient/Guardian was advised if they have not already done so to  contact the registration department to sign all necessary forms in order for [place] to release information regarding their care.   Consent: Patient/Guardian gives verbal consent for treatment and assignment of benefits for services provided during this visit. Patient/Guardian expressed understanding and agreed to proceed.   Plan: Provided Support/Talk therapy.  He will continue to re-examine significant life events and if reconsideration should be made.  Prior to leaving the appointment he confirmed he was in a stable and safe mindset.  He reports no SI, HI, or AVH.   Lauro Franklin, MD 09/14/2022

## 2022-09-21 ENCOUNTER — Ambulatory Visit (HOSPITAL_COMMUNITY): Payer: Medicare Other | Admitting: Student in an Organized Health Care Education/Training Program

## 2022-09-21 ENCOUNTER — Encounter (HOSPITAL_COMMUNITY): Payer: Self-pay | Admitting: Student in an Organized Health Care Education/Training Program

## 2022-09-21 DIAGNOSIS — F32A Depression, unspecified: Secondary | ICD-10-CM | POA: Diagnosis not present

## 2022-09-21 DIAGNOSIS — F4321 Adjustment disorder with depressed mood: Secondary | ICD-10-CM

## 2022-09-21 NOTE — Progress Notes (Signed)
[facility] [address] [place] [address] Dept: [phone] Dept Fax: [phone]  Psychotherapy Progress Note  Patient ID: Matthew [name], male  DOB: March 03, 1956, 67 y.o.  MRN: [id number]  09/21/2022 Start time: 10:10 AM End time: 11:02 AM  Method of Visit: Face-to-Face  Present: patient  Current Concerns:  He reports his wife keeps pulling away from him and his entire family.  He reports that his daughter has told him in the past that she has been driving a wedge between everyone else in his family.  He reports that when he was hospitalized last year she wrote letters to multiple of his family members that said that they were not supporting her through all of this.  He reports that he had brought up marriage counseling and she flat out refused to do it.  He reports that he feels like there is no connection between them.  He reports their sleep schedule is shifted by several hours further cutting down on the time they interact with each other.  He reports it is to the point where if he is in the other room she will text him instead of talking to him.  He reports further stressors around his mother.  He reports that last week his mother essentially fired all of the home health aides and nurses encouraged that all of them.  He reports that this is now put more work back on him.  He does report that he has taken up bee keeping again.  He reports that something he has done for decades of his life before.  He reports that when they lived in [place] he had 65 colonies at one point.  He reports that a few years ago the few colonies he had in [place] were destroyed and so did not take the hobby back up until this past week.  He reports his friend has a large protected farm where he has been able to set up a few hives.  Encouraged him to continue thinking about what makes him happy and ensure he is setting  aside time for him.  He reports he will reflect on this for next week.  Prior to leaving the appointment he confirmed he was in a stable and safe mindset.  He reports no SI, HI, or AVH.   Current Symptoms: Anhedonia, Depressed Mood, Family Stress, and Parenting problem  Psychiatric Specialty Exam: General Appearance: Casual and Fairly Groomed  Eye Contact:  Good  Speech:  Clear and Coherent and Normal Rate  Volume:  Normal  Mood:  Dysphoric  Affect:  Congruent  Thought Process:  Coherent and Goal Directed  Orientation:  Full (Time, Place, and Person)  Thought Content:  WDL and Logical  Suicidal Thoughts:  No  Homicidal Thoughts:  No  Memory:  Immediate;   Good Recent;   Good  Judgement:  Good  Insight:  Good  Psychomotor Activity:  Normal  Concentration:  Concentration: Good and Attention Span: Good  Recall:  Good  Fund of Knowledge:Good  Language: Good  Akathisia:  Negative  Handed:  Intact  AIMS (if indicated):  not done  Assets:  Communication Skills Desire for Improvement Financial Resources/Insurance Housing Resilience  ADL's:  Intact  Cognition: WNL  Sleep:  Poor     Diagnosis: Depression, Grief   Anticipated Frequency of Visits: weekly Anticipated Length of Treatment Episode: 16  Short Term Goals/Goals for Treatment Session: Determine his wants/needs and ensure he  is making time for him Progress Towards Goals: Progressing as evidenced by resuming bee keeping  Treatment Intervention: Insight-oriented therapy and Supportive therapy  Medical Necessity: Prevented onset or worsening of patient condition  Assessment Tools:     No data to display         Failed to redirect to the Timeline version of the REVFS SmartLink. Flowsheet Row ED from 07/28/2022 in [facility] Emergency Department at [facility] ED to Hosp-Admission (Discharged) from 03/03/2022 in [facility] 7M KIDNEY UNIT ED from 11/19/2021 in [facility] Emergency Department at  [facility]  C-SSRS RISK CATEGORY No Risk No Risk No Risk       Collaboration of Care:   Patient/Guardian was advised Release of Information must be obtained prior to any record release in order to collaborate their care with an outside provider. Patient/Guardian was advised if they have not already done so to contact the registration department to sign all necessary forms in order for [place] to release information regarding their care.   Consent: Patient/Guardian gives verbal consent for treatment and assignment of benefits for services provided during this visit. Patient/Guardian expressed understanding and agreed to proceed.   Plan: Provided talk/supportive therapy.  Encouraged him to consider what he needs/makes him happy.  Test him with reflecting on these things and how his current situation allows him or does not allow him to do those things.  Prior to leaving the appointment he confirmed he was in a stable and safe mindset.  He reports no SI, HI, or AVH.   Lauro Franklin, MD 09/21/2022

## 2022-09-28 ENCOUNTER — Ambulatory Visit (HOSPITAL_COMMUNITY): Payer: Medicare Other | Admitting: Student in an Organized Health Care Education/Training Program

## 2022-09-28 ENCOUNTER — Encounter (HOSPITAL_COMMUNITY): Payer: Self-pay | Admitting: Student in an Organized Health Care Education/Training Program

## 2022-09-28 DIAGNOSIS — F4321 Adjustment disorder with depressed mood: Secondary | ICD-10-CM | POA: Diagnosis not present

## 2022-09-28 DIAGNOSIS — F32A Depression, unspecified: Secondary | ICD-10-CM | POA: Diagnosis not present

## 2022-09-28 NOTE — Progress Notes (Signed)
[facility] [address] [place] [address] [place] [address] Dept: [phone] Dept Fax: [phone]  Psychotherapy Progress Note  Patient ID: Matthew [name], male  DOB: [birth year], 67 y.o.  MRN: [id number]  09/28/2022 Start time: 8:59 AM End time: 9:45 AM  Method of Visit: Face-to-Face  Present: patient  Current Concerns:  He reports that he had been thinking of things to do for himself.  He reports that he had always wanted to learn to play fretless banjo and so he purchased one from someone who hand makes them in [place].  He reports that it should be arriving today and he is very excited about it.  He reports he is continuing to think about where he would want to be to be happy.  He reports that he feels like living around the [place] area would probably be his ideal spot.  He reports there is lots of flyfishing around their and is about 2 hours away from his daughter and from [place] so centrally located for him.  He reports he would want to move closer to his daughter because of the issues between his daughter and ex-wife.  He reports that his daughter had a full ride to [employer] [employer] but because his ex-wife is so overbearing and had a place just off campus she did not want to go there.  He reports that she ended up going to [employer] [facility] [facility] which they ended up having to pay for 1 year for and he was not able to visit her more than a handful of times and so he does not want to do that to her intern.  He reports significant hardships during the last year when he was sick and hospitalized multiple times.  He reports he had severe delirium and hallucinations.  He recounts how the first time when he was back home he saw snakes everywhere and his wife would not reassure him and left him in the downstairs area.  He reports he became so frightened that he crawled up stairs.  He reports eventually seeing his hands turn into lights and  when he touched the "snakes" they disappeared.  He reports that he noticed that when he was hospitalized again and had hallucinations they were more positive as opposed to the more negative hallucinations when he was home.  He reports he just saw snails on him.  He reports that again after he got home he had hallucinations of Hunter's in the backyard shooting the [place] and deer in the woods behind their house.  Prior to leaving the appointment he confirmed he was in a stable and safe mindset.  He reports no SI, HI, or AVH.    Current Symptoms: Depressed Mood and Family Stress  Psychiatric Specialty Exam: General Appearance: Casual and Fairly Groomed  Eye Contact:  Good  Speech:  Clear and Coherent and Normal Rate  Volume:  Normal  Mood:  Anxious and Dysphoric  Affect:  Congruent  Thought Process:  Coherent and Goal Directed  Orientation:  Full (Time, Place, and Person)  Thought Content:  WDL and Logical  Suicidal Thoughts:  No  Homicidal Thoughts:  No  Memory:  Immediate;   Good Recent;   Good  Judgement:  Good  Insight:  Good  Psychomotor Activity:  Normal  Concentration:  Concentration: Good and Attention Span: Good  Recall:  Good  Fund of Knowledge:Good  Language: Good  Akathisia:  Negative  Handed:  Right  AIMS (if indicated):  not done  Assets:  Communication Skills Desire for Improvement Housing Resilience Talents/Skills  ADL's:  Intact  Cognition: WNL  Sleep:  Fair     Diagnosis:  Depression, Grief   Anticipated Frequency of Visits: weekly Anticipated Length of Treatment Episode: 16  Short Term Goals/Goals for Treatment Session: Rediscover activities for himself Progress Towards Goals: Progressing as evidenced by him buying a banjo.  Treatment Intervention: Supportive therapy  Medical Necessity: Assisted patient to achieve or maintain maximum functional capacity  Assessment Tools:     No data to display         Failed to redirect to the Timeline  version of the REVFS SmartLink. Flowsheet Row ED from 07/28/2022 in [facility] Emergency Department at [facility] ED to Hosp-Admission (Discharged) from 03/03/2022 in [facility] 53M KIDNEY UNIT ED from 11/19/2021 in [facility] Emergency Department at [facility]  C-SSRS RISK CATEGORY No Risk No Risk No Risk       Collaboration of Care:   Patient/Guardian was advised Release of Information must be obtained prior to any record release in order to collaborate their care with an outside provider. Patient/Guardian was advised if they have not already done so to contact the registration department to sign all necessary forms in order for [place] to release information regarding their care.   Consent: Patient/Guardian gives verbal consent for treatment and assignment of benefits for services provided during this visit. Patient/Guardian expressed understanding and agreed to proceed.   Plan: Provided talk/supportive therapy.  He has been thinking about doing more things for him and has bought a banjo as this is something he has wanted to do for a while.  We will continue working on ensuring he does things for him.  Prior to leaving the appointment he confirmed he was in a stable and safe mindset.  He reports no SI, HI, or AVH.   Lauro Franklin, MD 09/28/2022

## 2022-10-04 ENCOUNTER — Encounter (HOSPITAL_COMMUNITY): Payer: Self-pay | Admitting: Student in an Organized Health Care Education/Training Program

## 2022-10-04 ENCOUNTER — Ambulatory Visit (HOSPITAL_COMMUNITY): Payer: Medicare Other | Admitting: Student in an Organized Health Care Education/Training Program

## 2022-10-04 DIAGNOSIS — F32A Depression, unspecified: Secondary | ICD-10-CM

## 2022-10-04 DIAGNOSIS — F4321 Adjustment disorder with depressed mood: Secondary | ICD-10-CM | POA: Diagnosis not present

## 2022-10-04 NOTE — Progress Notes (Signed)
[facility] [address] [place] [address] [place] [address] Dept: [phone] Dept Fax: [phone]  Psychotherapy Progress Note  Patient ID: Matthew [name], male  DOB: 01-17-1956, 67 y.o.  MRN: [id number]  10/04/2022 Start time: 8:37 AM End time: 9:24 AM  Method of Visit: Face-to-Face  Present: patient  Current Concerns:  He reports that last night there was an argument between himself and his wife.  He reports that she frequently gets upset by her to oldest daughter's since they do not see each other that often.  He reports that is because they are similar to his wife and that they are "mean."  He reports that they were up late discussing this and when he goes off at some point she went through his phone and saw a text he wrote to his cousin.  He reports that she became very angry over this text.  He reports that the text consisted of his cousin saying that it seemed like they never did anything as a couple during holidays and that he had reported back to the cousin that his wife would take trips with her mother on holidays and his birthday to visit family members but that he was glad she had the ability and time to do so.  He reports that she took this as an attack on her and so was upset with him.  He reports how she cuts him out of many things.  He reports that during COVID they discovered that the water source for their town was highly polluted.  He reports that they send samples off to the EPA and it became a big deal.  He reports EPA agents came and interviewed them and everything.  He reports that when he was sick last year he was unable to keep up with things and he eventually learned from Susan's mother that an article had been written and he was not mentioned in it at all and he never knew the interview that happened.  He reports things continue to be tough with his mother due to her firing everyone that had been  coming to the house.  He reports that he did talk with her doctor and hospice.  He reports that at this point hospice is unable to provide any resources because she has not yet met the 65-month or less qualification.  He reports that her doctor did confirm she does have early dementia which is the explanation for her anger/irritability.  He reports continuing to work on incorporating things for him and what he wants/needs.  Prior to leaving the appointment he confirmed he was in a stable and safe mindset.  He reports no SI, HI, or AVH.   Current Symptoms: Anxiety, Depressed Mood, and Family Stress  Psychiatric Specialty Exam: General Appearance: Casual and Fairly Groomed  Eye Contact:  Good  Speech:  Clear and Coherent and Normal Rate  Volume:  Normal  Mood:  Anxious and Dysphoric  Affect:  Congruent  Thought Process:  Coherent and Goal Directed  Orientation:  Full (Time, Place, and Person)  Thought Content:  WDL and Logical  Suicidal Thoughts:  No  Homicidal Thoughts:  No  Memory:  Immediate;   Good Recent;   Good  Judgement:  Good  Insight:  Good  Psychomotor Activity:  Normal  Concentration:  Concentration: Good and Attention Span: Good  Recall:  Good  Fund of Knowledge:Good  Language: Good  Akathisia:  Negative  Handed:  Right  AIMS (if indicated):  not done  Assets:  Communication Skills Desire for Improvement Housing Resilience Talents/Skills  ADL's:  Intact  Cognition: WNL  Sleep:  Fair     Diagnosis: Depression, Grief   Anticipated Frequency of Visits: weekly Anticipated Length of Treatment Episode: 16  Short Term Goals/Goals for Treatment Session: Think about what he can do for himself. Progress Towards Goals: Progressing as evidenced by his recent purchase of a banjo and restarting the keeping.  Treatment Intervention: Supportive therapy  Medical Necessity: Prevented onset or worsening of patient condition  Assessment Tools:     No data to display          Failed to redirect to the Timeline version of the REVFS SmartLink. Flowsheet Row ED from 07/28/2022 in [facility] Emergency Department at [facility] ED to Hosp-Admission (Discharged) from 03/03/2022 in [facility] 1M KIDNEY UNIT ED from 11/19/2021 in [facility] Emergency Department at [facility]  C-SSRS RISK CATEGORY No Risk No Risk No Risk       Collaboration of Care:   Patient/Guardian was advised Release of Information must be obtained prior to any record release in order to collaborate their care with an outside provider. Patient/Guardian was advised if they have not already done so to contact the registration department to sign all necessary forms in order for [place] to release information regarding their care.   Consent: Patient/Guardian gives verbal consent for treatment and assignment of benefits for services provided during this visit. Patient/Guardian expressed understanding and agreed to proceed.   Plan: Provided talk/supportive therapy.  He is continuing to work on what he needs to do for himself as he has many pressures put on him by family.  Prior to leaving the appointment he confirmed he was in a stable and safe mindset.  He reports no SI, HI, or AVH.   Lauro Franklin, MD 10/04/2022

## 2022-10-27 ENCOUNTER — Encounter (HOSPITAL_COMMUNITY): Payer: Self-pay | Admitting: Student in an Organized Health Care Education/Training Program

## 2022-10-27 ENCOUNTER — Ambulatory Visit (INDEPENDENT_AMBULATORY_CARE_PROVIDER_SITE_OTHER): Payer: Medicare Other | Admitting: Student in an Organized Health Care Education/Training Program

## 2022-10-27 DIAGNOSIS — F32A Depression, unspecified: Secondary | ICD-10-CM | POA: Diagnosis not present

## 2022-10-27 DIAGNOSIS — F4321 Adjustment disorder with depressed mood: Secondary | ICD-10-CM

## 2022-10-27 NOTE — Progress Notes (Signed)
[facility] [address] [place] [address] [place] [address] Dept: [phone] Dept Fax: [phone]  Psychotherapy Progress Note  Patient ID: Matthew [name], male  DOB: [birth year], 67 y.o.  MRN: [id number]  10/27/2022 Start time: 9:05 AM End time: 9:50 AM  Method of Visit: Face-to-Face  Present: patient  Current Concerns:  He reports he and his wife did get into a massive argument.  He reports that after his wife and her mother visited her daughter there was an argument between them.  He reports that his wife was upset about the way her daughter had talked with them and he said that she often time does the same thing with his family and that she did not take this well.  He reports she deflected from this with "whataboutism's."  He reports that 1 thing that did mend the fence is between them was the troubles he had attempting to return the banjo he bought.  He reports that it would not hold its tune and so when he tried to return it the eBay seller turned nasty but that this brought the 2 of them together.  He reports that the second thing that mended defenses between them was him wanting to buy guitar since he did not have the banjo and not having the money for the layaway but her mother happening to have given them the exact amount of money he needed.  He reports that he has been thinking that problems are not always one-way street and that there must be things that he too is doing wrong.  He reports just not being sure how to find out what those are.  Discussed with him sometimes it is not the intent behind how question is asked but the wording choice.  He reports he does realize sometimes he may not phrase things the right way.  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  Encouraged him to journal as this would allow him to reflect on his behaviors and better see  patterns in his behavior.  He reports that this is something he noticed in the past.  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  He reports he sees how this could be beneficial in helping him examine how he interacts with others.  Prior to leaving the appointment he confirmed he was in a stable and safe mindset.  He reports no SI, HI, or AVH.    Current Symptoms: Anxiety, Depressed Mood, and Family Stress  Psychiatric Specialty Exam: General Appearance: Casual and Fairly Groomed  Eye Contact:  Good  Speech:  Clear and Coherent and Normal Rate  Volume:  Normal  Mood:  Anxious and Dysphoric  Affect:  Congruent  Thought Process:  Coherent and Goal Directed  Orientation:  Full (Time, Place, and Person)  Thought Content:  WDL and Logical  Suicidal Thoughts:  No  Homicidal Thoughts:  No  Memory:  Immediate;   Good Recent;   Good  Judgement:  Good  Insight:  Good  Psychomotor Activity:  Normal  Concentration:  Concentration: Good and Attention Span: Good  Recall:  Good  Fund of Knowledge:Good  Language: Good  Akathisia:  Negative  Handed:  Right  AIMS (if indicated):  not done  Assets:  Communication Skills Desire for Improvement Housing Resilience Talents/Skills  ADL's:  Intact  Cognition: WNL  Sleep:  Fair     Diagnosis: Depression, Grief   Anticipated Frequency of Visits: weekly Anticipated Length of Treatment Episode: 16  Short Term Goals/Goals for Treatment Session: Begin journaling and self reflection. Progress Towards Goals: Initial   Treatment Intervention: Supportive therapy  Medical Necessity: Prevented onset or worsening of patient condition  Assessment Tools:     No data to display          Failed to redirect to the Timeline version of the REVFS SmartLink. Flowsheet Row ED from 07/28/2022 in [facility] Emergency Department at [facility] ED to Hosp-Admission (Discharged) from 03/03/2022 in [facility] 69M KIDNEY UNIT ED from 11/19/2021 in [facility] Emergency Department at [facility]  C-SSRS RISK CATEGORY No Risk No Risk No Risk       Collaboration of Care:   Patient/Guardian was advised Release of Information must be obtained prior to any record release in order to collaborate their care with an outside provider. Patient/Guardian was advised if they have not already done so to contact the registration department to sign all necessary forms in order for [place] to release information regarding their care.   Consent: Patient/Guardian gives verbal consent for treatment and assignment of benefits for services provided during this visit. Patient/Guardian expressed understanding and agreed to proceed.   Plan: Provided talk/supportive therapy.  He will begin self reflecting through journaling as well as being more aware of how he interacts with others in his life.  Prior to leaving the appointment he confirmed he was in a stable and safe mindset.  He reports no SI, HI, or AVH.    Lauro Franklin, MD 10/27/2022

## 2022-11-01 DIAGNOSIS — I10 Essential (primary) hypertension: Secondary | ICD-10-CM | POA: Diagnosis not present

## 2022-11-01 DIAGNOSIS — R300 Dysuria: Secondary | ICD-10-CM | POA: Diagnosis not present

## 2022-11-03 DIAGNOSIS — R310 Gross hematuria: Secondary | ICD-10-CM | POA: Diagnosis not present

## 2022-11-20 DIAGNOSIS — R310 Gross hematuria: Secondary | ICD-10-CM | POA: Diagnosis not present

## 2022-12-01 ENCOUNTER — Ambulatory Visit (INDEPENDENT_AMBULATORY_CARE_PROVIDER_SITE_OTHER): Payer: Medicare Other | Admitting: Student in an Organized Health Care Education/Training Program

## 2022-12-01 ENCOUNTER — Encounter (HOSPITAL_COMMUNITY): Payer: Self-pay | Admitting: Student in an Organized Health Care Education/Training Program

## 2022-12-01 DIAGNOSIS — F4321 Adjustment disorder with depressed mood: Secondary | ICD-10-CM | POA: Diagnosis not present

## 2022-12-01 DIAGNOSIS — F319 Bipolar disorder, unspecified: Secondary | ICD-10-CM

## 2022-12-01 NOTE — Progress Notes (Signed)
[facility] [address] [place] [address] [place] [address] Dept: [phone] Dept Fax: [phone]  Psychotherapy Progress Note  Patient ID: Matthew [name], male  DOB: 12/18/1955, 67 y.o.  MRN: [id number]  12/01/2022 Start time: 10:09 AM End time: 10:47 AM  Method of Visit: Face-to-Face  Present: patient  Current Concerns:  He reports that he began journaling.  He reports that as he was doing this it became evident that one of his biggest issues is he is lonely.  He reports that he and his wife do not really do much together.  He reports that he has been working on things he can do to alleviate this.  He reports he has been trying to get more involved in groups for activities that he already does such as be keeping and blues grass playing.  He reports 1 concern that has recently happened was he began having bloody ejaculate.  He reports that he went to the doctor and has been seen by urology and there is a mass on his prostate and so is having a cystoscopy done next week but that this has been weighing heavily on his mind.  Prior to leaving the appointment he confirmed he was in a stable and safe mindset.  He reports no SI, HI, or AVH.    Current Symptoms: Anxiety, Depressed Mood, and Family Stress  Psychiatric Specialty Exam: General Appearance: Casual and Fairly Groomed  Eye Contact:  Good  Speech:  Clear and Coherent and Normal Rate  Volume:  Normal  Mood:  Anxious and Dysphoric  Affect:  Congruent  Thought Process:  Coherent and Goal Directed  Orientation:  Full (Time, Place, and Person)  Thought Content:  WDL and Logical  Suicidal Thoughts:  No  Homicidal Thoughts:  No  Memory:  Immediate;   Good Recent;   Good  Judgement:  Good  Insight:  Good  Psychomotor Activity:  Normal  Concentration:  Concentration: Good and Attention Span: Good  Recall:  Good  Fund of Knowledge:Good  Language: Good   Akathisia:  Negative  Handed:  Right  AIMS (if indicated):  not done  Assets:  Communication Skills Desire for Improvement Housing Resilience Talents/Skills  ADL's:  Intact  Cognition: WNL  Sleep:  Fair     Diagnosis: Depression, Grief   Anticipated Frequency of Visits: weekly Anticipated Length of Treatment Episode: 16  Short Term Goals/Goals for Treatment Session: Continue journaling and work on hobbies. Progress Towards Goals: Progressing   Treatment Intervention: Supportive therapy  Medical Necessity: Prevented onset or worsening of patient condition  Assessment Tools:     No data to display         Failed to redirect to the Timeline version of the REVFS SmartLink. Flowsheet Row ED from 07/28/2022 in [facility] Emergency Department at [facility] ED to Hosp-Admission (Discharged) from 03/03/2022 in [facility] 66M KIDNEY UNIT ED from 11/19/2021 in [facility] Emergency Department at [facility]  C-SSRS RISK CATEGORY No Risk No Risk No Risk       Collaboration of Care:   Patient/Guardian was advised Release of Information must be obtained prior to any record release in order to collaborate their care with an outside provider. Patient/Guardian was advised if they have not already done so to contact the registration department to sign all necessary forms in order for [place] to release information regarding their care.   Consent: Patient/Guardian gives verbal consent for treatment and  assignment of benefits for services provided during this visit. Patient/Guardian expressed understanding and agreed to proceed.   Plan: Provided talk/supportive therapy.  He has been journaling which has revealed loneliness that he has begun working on.  He will continue to think of hobbies and activities that he can do to lessen this.  He is having a cystoscopy done next week for further evaluation of a mass on his prostate.  Prior to leaving the appointment he  confirmed he was in a stable and safe mindset.  He reports no SI, HI, or AVH.     Lauro Franklin, MD 12/01/2022

## 2022-12-14 NOTE — Progress Notes (Signed)
[facility] [address] [place]. [place] [address] [phone]       OFFICE FLLOW UP VISIT NOTE  [name] TO Date of Birth:  01/14/1956 Medical Record Number:  [id number]   Referring MD: Joycelyn Das   Primary neurologist: Dr. Pearlean Brownie Reason for Referral: Seizure    HPI:   Initial visit 07/08/2020 Dr. Pearlean Brownie: [name] is a 67 year old Caucasian male seen today for initial office consultation visit for seizure.  History is obtained from the patient and review of electronic medical records and I have personally reviewed pertinent imaging films in PACS.  He has a past medical history of anxiety and bipolar disorder, migraines, hypertension, panic attacks and substance abuse.  He was seen in the ER on 05/04/2020 when he developed an episode in which he stated he his eyes deviated tonically to the left and it was uncomfortable he tried to look back across and walk and stumbled and fell and had a severe concussion with frontal and periorbital swelling with brief loss of consciousness.  Some confusion after that but gradually improved by the time he came to the ER.  On inquiry he and his wife admitted he had had for the last few well several episodes in which he felt subjectively his eyes were looking in different directions and he could not control them.  During these episodes her wife noticed that he would stare into the distance and make abnormal breathing type sound.  Episodes of fairly stereotypical occurring several times a year.  He would occasionally rapidly blink during these episodes.  Patient has a history of bipolar disorder and was on lamotrigine and lithium and had apparently stopped this medication a month prior to the last episode in January.  Is lithium and lamotrigine levels were undetectable on 05/05/2020.  EEG was normal.  MRI scan of the brain showed no acute brain abnormality.  There are mild changes of chronic small vessel disease.  Incidental 1.2 cm  midline posterior nasopharynx cyst was noted.  Right frontoparietal and periorbital contusion was also noted.  Patient also has history of migraine headaches and he takes Topamax 200 mg daily for prophylaxis it seems to have worked quite well and migraines in the down to once every couple of months.  He was recently seen by psychiatrist to seem quite content with his current medication for bipolar and did not suggest any changes.  Patient has no new complaints today.  He has no prior history of seizures, strokes, TIAs, significant head injury with loss of consciousness except the most recent one.  No childhood history of febrile seizures family history of epilepsy. Update 10/28/2020 Dr. Pearlean Brownie; He returns for follow-up after last visit 3 months ago.  Is accompanied by his wife.  He is doing well and has not had any generalized seizures or staring episodes.  He remains on Lamictal 200 mg daily she is tolerating well without side effects.  He is also on Topamax 200 mg daily and his migraines are quite well controlled and occur only once every couple of months.  He is a decreased appetite and has been losing weight.  Patient continues to have mild short-term memory difficulties but these are not progressive.  He states his bipolar symptoms are quite stable and he has regular follow-up with his psychiatrist.  He had follow-up EEG on 08/05/2020 which was also normal.  He has no new complaints today.   Update 02/10/2021 JM: Returns for 58-month seizure follow-up.  Overall stable without  any reoccurring seizure events.  Remains on lamotrigine (prior 200mg  daily but per pt, was changed to either 100mg  BID or 150mg  BID by Dr. Pearlean Brownie - unable to verify this via epic).  No side effects.  Remains on topiramate 100 mg daily for migraine prophylaxis. No migraine occurrence since decreasing dosage from 200 mg to 100 mg and does report excellent improvement of his memory - still not "perfect" but greatly improved.  Routinely followed  by psychiatry and PCP.  No concerns today.    Update 07/13/2021 JM: Patient being seen for recent hospital follow-up accompanied by his wife.  He presented to ED on 07/05/2021 with confusion and garbled speech and some aphasia and then separate episode shortly after consisting of staring off, not responding, and stumbled in hallway at home.  He received TNK due to stroke concern, MRI and CTA unremarkable.  EEG showed epileptogenicity and cortical dysfunction arising from left frontotemporal region.  Episode initially concerning for stroke but after work-up, more concerning for seizure therefore increased lamotrigine to 200 mg twice daily.  Topiramate discontinued.  He was discharged home on 3/9.  Since discharge, he has been doing well. Does have some fatigue especially after increased exertion.  He has had no additional seizure activity and tolerating higher dose of lamotrigine without side effects. Wife mentions frequent panic attacks, closely followed by psychiatry. He will use clonazepam occasionally for panic attacks but does report increased fatigue after taking.  He has not had any migraines or headaches since discontinuing topiramate.   EEG 07/06/2021- epileptogenicity and cortical dysfunction arising from left frontotemporal region. Additionally there is mild diffuse encephalopathy, nonspecific etiology. No seizures were seen throughout the recording.    Update 11/21/2021 JM: Patient returns for 32-month seizure follow-up accompanied by his wife  Evaluated in ED 7/2 with 1 to 2-week progression of confusion, falling and weakness likely in setting of toxic metabolic encephalopathy due to polypharmacy and lithium OD. Per note review, psychiatrist made adjustments to medication regimen with decreasing lamotrigine dosage and adding Depakote and Seroquel in addition to lithium and as needed Sonata and Klonopin.  There was concern of unintentional overdosing with these multiple medications.  CT head no  acute findings.  Medication adjustments made.  Depakote and Seroquel discontinued as well as topiramate (although this had been previously d/c'd).  Lithium levels normalized and mental status gradually returned to baseline.  Evaluated by psychiatry and neurology.  Discharged on lamotrigine 200 mg twice daily (prior dosage for seizure prevention). Advised outpatient neuro f/u as well as PCP/psychiatry follow-up.   He has been stable since discharge but still feels a generalized weakness. Denies any recent seizure activity.  Currently taking lamotrigine 200 mg twice daily without side effects.   He has not had any recent migraine headaches, he is not currently on any prophylactic medications  Previously being followed by a psychiatrist in [place] with in person visits 3-4 times per year otherwise completed via virtual visits. Due to recent event, they are now in the process of changing psychiatrist and is scheduled to see Dr. Renaldo Fiddler on 7/31.  Wife has concerns regarding medications that were prescribed by psychiatry as well as by this office as there was duplicate medications (psychiatry was also prescribing lamotrigine).   Of note, wife is also concerned regarding reported history of substance abuse on patient's chart most notably use of cocaine.  He did have positive UDS for cocaine metabolites back in 2009 and 2011 but he denies any active use during that time.  Recent UDS negative for cocaine use.  He does admit to occasional THC use but no other drug use. His recent UDS was positive for opiates and per wife, she gave him a half tab of oxycodone (which was from an old prescription 2 years ago) due to severe lower abdominal pain which was eventually found to have enlarged prostate, he is scheduled to see urology next month.  He does not routinely take any type of opiates.  No further concerns at this time   Update 04/17/2022 JM: Patient returns for follow-up visit after prior visit 5 months ago  accompanied by his wife.  Stable since prior visit.  Denies any reoccurring seizure activity.  Remains on lamotrigine 200 mg twice daily.  He has not had any recent migraine headaches.  He has since establish care with behavioral health in this area.  He underwent bilateral hernia repair surgery on 11/1 and presented to ED on 11/3 with fever, abdominal pain, AMS, N/V and headache.  Found to have pneumoperitoneum likely postop complication.  Evaluated by neurology as initial concern for seizures, EEG negative for seizures. CT head completed for headache which was negative.  Received migraine cocktail.  He returned home after 6-day stay.  Feels he is completely back to baseline and is closely followed by general surgery.    Update 12/18/2022 JM: Returns for follow-up visit unaccompanied.  Stable without any reoccurring seizure activity.  Remains on lamotrigine 200 mg twice daily, reports tolerating well. Denies any recent headaches. Routinely followed by behavioral health.  Routinely follows with PCP, reports recent lab work showed elevated PSA, is scheduled next week to undergo cystoscopy with [facility] for further evaluation.      ROS:   14 system review of systems is positive for those listed in HPI and all other systems negative  PMH:  Past Medical History:  Diagnosis Date   Anxiety    on meds   AVM (arteriovenous malformation) of colon 02/04/2021   Bipolar I disorder, most recent episode (or current) mixed, moderate    GERD (gastroesophageal reflux disease)    Hypertension    on meds   Migraines    OSA (obstructive sleep apnea)    retested-bipap not needed   Panic attacks    Seizures (HCC)    last known 05/2020;   Substance abuse (HCC)     Social History:  Social History   Socioeconomic History   Marital status: Married    Spouse name: susan   Number of children: Not on file   Years of education: Not on file   Highest education level: Not on file  Occupational  History   Occupation: Disability    Comment: Bipolar disorder  Tobacco Use   Smoking status: Former    Types: Pipe, Cigars   Smokeless tobacco: Never  Vaping Use   Vaping status: Never Used  Substance and Sexual Activity   Alcohol use: No   Drug use: No    Types: Cocaine    Comment: past history of use-   Sexual activity: Not on file  Other Topics Concern   Not on file  Social History Narrative   Lives with wife   Right Handed   Drinks 3-4 cups caffeine daily      Social Determinants of Health   Financial Resource Strain: Not on file  Food Insecurity: Not on file  Transportation Needs: No Transportation Needs (03/03/2022)   PRAPARE - Administrator, Civil Service (Medical): No  Lack of Transportation (Non-Medical): No  Physical Activity: Not on file  Stress: Not on file  Social Connections: Not on file  Intimate Partner Violence: Patient Declined (03/03/2022)   Humiliation, Afraid, Rape, and Kick questionnaire    Fear of Current or Ex-Partner: Patient declined    Emotionally Abused: Patient declined    Physically Abused: Patient declined    Sexually Abused: Patient declined    Medications:   Current Outpatient Medications on File Prior to Visit  Medication Sig Dispense Refill   amLODipine (NORVASC) 10 MG tablet Take 1 tablet (10 mg total) by mouth daily. 30 tablet 0   butalbital-acetaminophen-caffeine (FIORICET) 50-325-40 MG tablet Take 1-2 tablets by mouth every 6 (six) hours as needed for headache. 20 tablet 0   Calcium Carbonate Antacid (TUMS PO) Take 1 tablet by mouth daily as needed (heartburn).     carvedilol (COREG) 6.25 MG tablet Take 1 tablet (6.25 mg total) by mouth 2 (two) times daily with a meal. 60 tablet 0   Cholecalciferol (VITAMIN D3) 250 MCG (10000 UT) capsule Take 10,000 Units by mouth once a week.     clonazePAM (KLONOPIN) 1 MG tablet Take 1 tablet (1 mg total) by mouth daily as needed (for severe anxiety or agitation). 5 tablet 0    HYDROcodone-acetaminophen (NORCO) 7.5-325 MG tablet Take 1 tablet by mouth every 6 (six) hours as needed for moderate pain. Takes as needed - this is from an old rx.     lamoTRIgine (LAMICTAL) 200 MG tablet Take 1 tablet (200 mg total) by mouth 2 (two) times daily. 180 tablet 3   losartan (COZAAR) 25 MG tablet Take 25 mg by mouth daily.     methocarbamol (ROBAXIN) 500 MG tablet Take 1 tablet (500 mg total) by mouth every 6 (six) hours as needed for muscle spasms. 30 tablet 0   polyethylene glycol (MIRALAX / GLYCOLAX) 17 g packet Take 17 g by mouth daily. 14 each 0   zaleplon (SONATA) 10 MG capsule Take 1 capsule (10 mg total) by mouth at bedtime as needed for sleep. 30 capsule 0   No current facility-administered medications on file prior to visit.    Allergies:   Allergies  Allergen Reactions   Depakote [Divalproex Sodium] Nausea And Vomiting   Seroquel [Quetiapine Fumarate] Nausea And Vomiting   Sumatriptan Other (See Comments)     dizziness   Tramadol     Severe dizziness, restless leg, jittery    Physical Exam Today's Vitals   12/18/22 0830  BP: 130/79  Pulse: (!) 51  Weight: 150 lb (68 kg)  Height: 5\' 10"  (1.778 m)   Body mass index is 21.52 kg/m.    General: well developed, well nourished very pleasant middle-aged Caucasian male, seated, in no evident distress Head: head normocephalic and atraumatic.   Neck: supple with no carotid or supraclavicular bruits Cardiovascular: regular rate and rhythm, no murmurs Musculoskeletal: no deformity Skin:  no rash/petichiae Vascular:  Normal pulses all extremities  Neurologic Exam Mental Status: Awake and fully alert.  Fluent speech and language.  Oriented to place and time. Recent and remote memory intact. Attention span, concentration and fund of knowledge appropriate. Mood and affect appropriate.  Cranial Nerves: Pupils equal, briskly reactive to light. Extraocular movements full without nystagmus. Visual fields full to  confrontation. Hearing intact. Facial sensation intact. Face, tongue, palate moves normally and symmetrically.  Motor: Normal bulk and tone. Normal strength in all tested extremity muscles. Sensory.: intact to touch , pinprick , position  and vibratory sensation.  Coordination: Rapid alternating movements normal in all extremities. Finger-to-nose and heel-to-shin performed accurately bilaterally. Gait and Station: Arises from chair without difficulty. Stance is normal. Gait demonstrates normal stride length and balance without use of assistive device. Able to heel, toe and tandem walk with mild difficulty.  Reflexes: 1+ and symmetric. Toes downgoing.       ASSESSMENT: 67 year old Caucasian male with recurrent stereotypical episodes likely complex partial seizures.  In January 2022 he had severe tonic deviation of his eyes to the left, fell and developed a contusion with brief loss of consciousness.  He had been on lamotrigine and lithium for bipolar disorder which he had discontinued a month prior which may have triggered a more severe episode.  Recurrent seizure mimicking stroke 07/05/2021 (see HPI).  Recent hospitalization 7/2 for toxic encephalopathy most likely from polypharmacy and lithium OD (unintentionally)    PLAN:  1.  Seizure disorder 2.  Complex partial seizures -Continue lamotrigine 200 mg twice daily-refill provided.  -Repeat lamotrigine level today - prior Lamotrigine level 1.6 (03/2022) - likely on low side as this was obtained during recent admission for post op complications of hernia repair (see HPI).  Prior level 10/2021 9.1 -EEG 11/2021 normal, no focal lateralizing or epileptiform features -EEG 06/2021 epileptogenicity and cortical dysfunction arising from left frontal temporal region -avoid seizure provoking stimuli like medication noncompliance, sleep deprivation and extremes of activity.   -Advised to call office with any seizure activity   3.  Chronic migraine  headaches -No recent migraine headaches, continue to monitor      Follow-up in 1 year or call earlier if needed    CC:  Soundra Pilon, FNP   I spent 25 minutes of face-to-face and non-face-to-face time with patient.  This included previsit chart review, lab review, study review, order entry, electronic health record documentation, patient education and discussion regarding above diagnoses and treatment plan and answered all the questions to patients satisfaction   Ihor Austin, [facility]  [facility] [address] [place], [place] [address]  Phone [phone] Fax [phone] Note: This document was prepared with digital dictation and possible smart phrase technology. Any transcriptional errors that result from this process are unintentional.

## 2022-12-18 ENCOUNTER — Ambulatory Visit: Payer: Medicare Other | Admitting: Adult Health

## 2022-12-18 ENCOUNTER — Encounter: Payer: Self-pay | Admitting: Adult Health

## 2022-12-18 VITALS — BP 130/79 | HR 51 | Ht 70.0 in | Wt 150.0 lb

## 2022-12-18 DIAGNOSIS — Z5181 Encounter for therapeutic drug level monitoring: Secondary | ICD-10-CM | POA: Diagnosis not present

## 2022-12-18 DIAGNOSIS — G40009 Localization-related (focal) (partial) idiopathic epilepsy and epileptic syndromes with seizures of localized onset, not intractable, without status epilepticus: Secondary | ICD-10-CM | POA: Diagnosis not present

## 2022-12-18 DIAGNOSIS — G40909 Epilepsy, unspecified, not intractable, without status epilepticus: Secondary | ICD-10-CM

## 2022-12-18 MED ORDER — LAMOTRIGINE 200 MG PO TABS: 200.0000 mg | ORAL_TABLET | Freq: Two times a day (BID) | ORAL | 3 refills | Status: DC

## 2022-12-18 NOTE — Patient Instructions (Addendum)
Your Plan:  Continue lamotrigine 200mg  twice daily for seizure prevention - refill provided   We will check levels today      Follow up in 1 year or call earlier if needed      Thank you for coming to see [place] at [facility]. I hope we have been able to provide you high quality care today.  You may receive a patient satisfaction survey over the next few weeks. We would appreciate your feedback and comments so that we may continue to improve ourselves and the health of our patients.

## 2022-12-19 LAB — LAMOTRIGINE LEVEL: Lamotrigine Lvl: 8.9 ug/mL (ref 2.0–20.0)

## 2022-12-22 ENCOUNTER — Ambulatory Visit (INDEPENDENT_AMBULATORY_CARE_PROVIDER_SITE_OTHER): Payer: Medicare Other | Admitting: Student in an Organized Health Care Education/Training Program

## 2022-12-22 ENCOUNTER — Encounter (HOSPITAL_COMMUNITY): Payer: Self-pay | Admitting: Student in an Organized Health Care Education/Training Program

## 2022-12-22 DIAGNOSIS — F32A Depression, unspecified: Secondary | ICD-10-CM

## 2022-12-22 DIAGNOSIS — F4321 Adjustment disorder with depressed mood: Secondary | ICD-10-CM

## 2022-12-22 NOTE — Progress Notes (Signed)
[facility] [address] [place] [address] [place] [address] Dept: [phone] Dept Fax: [phone]  Psychotherapy Progress Note  Patient ID: Matthew [name], male  DOB: 1956/01/25, 67 y.o.  MRN: [id number]  12/22/2022 Start time: 9:01 AM End time: 9:47 AM  Method of Visit: Face-to-Face  Present: patient  Current Concerns:  He reports continual stress from his family specifically around his mother.  He reports that it was her 90th birthday yesterday.  He reports that his sister made it an elaborate party with catering and everything.  He reports that essentially he feels a bit used.  He reports that his mother is a chain smoker.  He reports that due to this there is such a fall in her house that recently it has begun making him sick.  He reports that due to this he has had to stop going over there.  He reports that his sister has voiced issues with this but has not gone over herself.  He also reports that it has become more difficult to do phone calls with her.  He reports that even though he would go over to see her about 3 times a week previously he would still call her every other day that he did not go over and that these calls would last 1 to 2 hours.  He reports that most of the time though she would be yelling and berating him.    Discussed the importance of boundary setting and that this is something he might want to employ with her.  He reports that he had already set the boundaries with going to her house but does agree that he might have to set up a telephone boundary as well.  He reports that his wife is supportive of him doing this as well.  He reports feeling some guilt over this.  He reports but it has become too much for him and that he no longer shares when he logs with his mother because she would belittle him forward.  Prior to leaving the appointment he confirmed he was in a stable and safe mindset.  He  reports no SI, HI, or AVH.     Current Symptoms: Anxiety, Depressed Mood, Family Stress, and Sibling problem  Psychiatric Specialty Exam: General Appearance: Casual and Fairly Groomed  Eye Contact:  Good  Speech:  Clear and Coherent and Normal Rate  Volume:  Normal  Mood:  Dysphoric  Affect:  Congruent  Thought Process:  Coherent and Goal Directed  Orientation:  Full (Time, Place, and Person)  Thought Content:  WDL and Logical  Suicidal Thoughts:  No  Homicidal Thoughts:  No  Memory:  Immediate;   Good Recent;   Good  Judgement:  Good  Insight:  Good  Psychomotor Activity:  Normal  Concentration:  Concentration: Good and Attention Span: Good  Recall:  Good  Fund of Knowledge:Good  Language: Good  Akathisia:  Negative  Handed:  Right  AIMS (if indicated):  not done  Assets:  Communication Skills Desire for Improvement Housing Resilience Talents/Skills  ADL's:  Intact  Cognition: WNL  Sleep:  Fair     Diagnosis: Depression, Grief   Anticipated Frequency of Visits: weekly Anticipated Length of Treatment Episode: 16  Short Term Goals/Goals for Treatment Session: Establish boundaries with mother Progress Towards Goals: Initial   Treatment Intervention: Supportive therapy  Medical Necessity: Prevented onset or worsening of patient condition  Assessment Tools:     No data  to display         Failed to redirect to the Timeline version of the REVFS SmartLink. Flowsheet Row ED from 07/28/2022 in [facility] at [facility] ED to Hosp-Admission (Discharged) from 03/03/2022 in [facility] ED from 11/19/2021 in [facility] Emergency Department at [facility]  C-SSRS RISK CATEGORY No Risk No Risk No Risk       Collaboration of Care:   Patient/Guardian was advised Release of Information must be obtained prior to any record release in order to collaborate their care with an outside provider.  Patient/Guardian was advised if they have not already done so to contact the registration department to sign all necessary forms in order for [place] to release information regarding their care.   Consent: Patient/Guardian gives verbal consent for treatment and assignment of benefits for services provided during this visit. Patient/Guardian expressed understanding and agreed to proceed.   Plan: Provided talk/supportive therapy.  He will work on maintaining physical boundary with his mother as well as establishing phone boundaries.  Prior to leaving the appointment he confirmed he was in a stable and safe mindset.  He reports no SI, HI, or AVH.      Lauro Franklin, MD 12/22/2022

## 2022-12-26 DIAGNOSIS — R3911 Hesitancy of micturition: Secondary | ICD-10-CM | POA: Diagnosis not present

## 2022-12-26 DIAGNOSIS — R310 Gross hematuria: Secondary | ICD-10-CM | POA: Diagnosis not present

## 2023-01-05 ENCOUNTER — Encounter (HOSPITAL_COMMUNITY): Payer: Self-pay | Admitting: Student in an Organized Health Care Education/Training Program

## 2023-01-05 ENCOUNTER — Ambulatory Visit (INDEPENDENT_AMBULATORY_CARE_PROVIDER_SITE_OTHER): Payer: Medicare Other | Admitting: Student in an Organized Health Care Education/Training Program

## 2023-01-05 DIAGNOSIS — F32A Depression, unspecified: Secondary | ICD-10-CM

## 2023-01-05 NOTE — Progress Notes (Signed)
[facility] [address] [place] [address] [place] [address] Dept: [phone] Dept Fax: [phone]  Psychotherapy Progress Note  Patient ID: ALAM BOTA, male  DOB: May 23, 1955, 67 y.o.  MRN: [id number]  01/05/2023 Start time: 10: 30 AM End time: 11:16 AM  Method of Visit: Face-to-Face  Present: patient  Current Concerns:  He reports things have been going all right with his mother in regards to establishing and maintaining boundaries with her.  He reports that she has accepted him not coming into the house much anymore due to the issues he is having with her smoking.  He reports that his sister has hired someone to come help for the hours that he used to.  He reports that the last time they talked things went relatively well.  He reports that he notices his hobbies are mostly lonely-flyfishing and be keeping.  He does report that his music is his main hobby that involves others.  He reports that he always puts 100% into things and he is realizing he needs to put on a percent into himself.  He reports he has been working on this and plans to continue doing so.  Prior to leaving the appointment he confirmed he was in a stable and safe mindset.  He reports no SI, HI, or AVH.    Current Symptoms: Family Stress and Sibling problem  Psychiatric Specialty Exam: General Appearance: Casual and Fairly Groomed  Eye Contact:  Good  Speech:  Clear and Coherent and Normal Rate  Volume:  Normal  Mood:  Dysphoric  Affect:  Congruent  Thought Process:  Coherent and Goal Directed  Orientation:  Full (Time, Place, and Person)  Thought Content:  WDL and Logical  Suicidal Thoughts:  No  Homicidal Thoughts:  No  Memory:  Immediate;   Good Recent;   Good  Judgement:  Good  Insight:  Good  Psychomotor Activity:  Normal  Concentration:  Concentration: Good and Attention Span: Good  Recall:  Good  Fund of Knowledge:Good   Language: Good  Akathisia:  Negative  Handed:  Right  AIMS (if indicated):  not done  Assets:  Communication Skills Desire for Improvement Housing Resilience Talents/Skills  ADL's:  Intact  Cognition: WNL  Sleep:  Fair     Diagnosis: Depression, Grief   Anticipated Frequency of Visits: weekly Anticipated Length of Treatment Episode: 16  Short Term Goals/Goals for Treatment Session: Establish boundaries with mother Progress Towards Goals: Progressing has been able to maintain not going into mothers house except for small amounts of time.   Treatment Intervention: Supportive therapy  Medical Necessity: Prevented onset or worsening of patient condition  Assessment Tools:     No data to display         Failed to redirect to the Timeline version of the REVFS SmartLink. Flowsheet Row ED from 07/28/2022 in [facility] Emergency Department at [facility] ED to Hosp-Admission (Discharged) from 03/03/2022 in [facility] 46M KIDNEY UNIT ED from 11/19/2021 in [facility] (Hosp-Psy) Emergency Department at [facility]  C-SSRS RISK CATEGORY No Risk No Risk No Risk       Collaboration of Care:   Patient/Guardian was advised Release of Information must be obtained prior to any record release in order to collaborate their care with an outside provider. Patient/Guardian was advised if they have not already done so to contact the registration department to sign all necessary forms in order for [place] to release information  regarding their care.   Consent: Patient/Guardian gives verbal consent for treatment and assignment of benefits for services provided during this visit. Patient/Guardian expressed understanding and agreed to proceed.   Plan: Provided talk/supportive therapy.  He has been able to maintain the boundaries with his mother and sister.  He will continue working on hobbies and doing things for himself.  Prior to leaving the appointment he confirmed he was in a stable  and safe mindset.  He reports no SI, HI, or AVH.     Lauro Franklin, MD 01/05/2023

## 2023-01-15 DIAGNOSIS — H40053 Ocular hypertension, bilateral: Secondary | ICD-10-CM | POA: Diagnosis not present

## 2023-01-15 DIAGNOSIS — H524 Presbyopia: Secondary | ICD-10-CM | POA: Diagnosis not present

## 2023-01-19 ENCOUNTER — Ambulatory Visit (HOSPITAL_COMMUNITY): Payer: Medicare Other | Admitting: Student in an Organized Health Care Education/Training Program

## 2023-01-19 NOTE — Progress Notes (Unsigned)
[facility] [address] [place] [address] [place] [address] Dept: [phone] Dept Fax: [phone]  Psychotherapy Progress Note  Patient ID: Matthew [name], male  DOB: 16-Feb-1956, 67 y.o.  MRN: [id number]  01/19/2023 Start time: 9:*** AM End time: 9:*** AM  Method of Visit: Face-to-Face  Present: patient  Current Concerns:  He reports ***    Current Symptoms: Family Stress and Sibling problem *** Psychiatric Specialty Exam: General Appearance: Casual and Fairly Groomed  Eye Contact:  Good  Speech:  Clear and Coherent and Normal Rate  Volume:  Normal  Mood:  Dysphoric  Affect:  Congruent  Thought Process:  Coherent and Goal Directed  Orientation:  Full (Time, Place, and Person)  Thought Content:  WDL and Logical  Suicidal Thoughts:  No  Homicidal Thoughts:  No  Memory:  Immediate;   Good Recent;   Good  Judgement:  Good  Insight:  Good  Psychomotor Activity:  Normal  Concentration:  Concentration: Good and Attention Span: Good  Recall:  Good  Fund of Knowledge:Good  Language: Good  Akathisia:  Negative  Handed:  Right  AIMS (if indicated):  not done  Assets:  Communication Skills Desire for Improvement Housing Resilience Talents/Skills  ADL's:  Intact  Cognition: WNL  Sleep:  Fair     Diagnosis: Depression, Grief   Anticipated Frequency of Visits: weekly Anticipated Length of Treatment Episode: 16  Short Term Goals/Goals for Treatment Session: Establish boundaries with mother Progress Towards Goals: Progressing has been able to maintain not going into mothers house except for small amounts of time.   Treatment Intervention: Supportive therapy  Medical Necessity: Prevented onset or worsening of patient condition  Assessment Tools:     No data to display         Failed to redirect to the Timeline version of the REVFS SmartLink. Flowsheet Row ED from 07/28/2022 in [facility] Emergency Department at [facility] ED to Hosp-Admission (Discharged) from 03/03/2022 in [facility] 12M KIDNEY UNIT ED from 11/19/2021 in [facility] Emergency Department at [facility]  C-SSRS RISK CATEGORY No Risk No Risk No Risk       Collaboration of Care:   Patient/Guardian was advised Release of Information must be obtained prior to any record release in order to collaborate their care with an outside provider. Patient/Guardian was advised if they have not already done so to contact the registration department to sign all necessary forms in order for [place] to release information regarding their care.   Consent: Patient/Guardian gives verbal consent for treatment and assignment of benefits for services provided during this visit. Patient/Guardian expressed understanding and agreed to proceed.   Plan: Provided talk/supportive therapy.  ***     Lauro Franklin, MD 01/19/2023

## 2023-01-22 DIAGNOSIS — H40053 Ocular hypertension, bilateral: Secondary | ICD-10-CM | POA: Diagnosis not present

## 2023-02-16 ENCOUNTER — Ambulatory Visit (INDEPENDENT_AMBULATORY_CARE_PROVIDER_SITE_OTHER): Payer: Medicare Other | Admitting: Student in an Organized Health Care Education/Training Program

## 2023-02-16 ENCOUNTER — Encounter (HOSPITAL_COMMUNITY): Payer: Self-pay | Admitting: Student in an Organized Health Care Education/Training Program

## 2023-02-16 DIAGNOSIS — F32A Depression, unspecified: Secondary | ICD-10-CM
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
## 2023-03-23 ENCOUNTER — Encounter (HOSPITAL_COMMUNITY): Payer: Self-pay | Admitting: Student in an Organized Health Care Education/Training Program

## 2023-03-23 ENCOUNTER — Ambulatory Visit (INDEPENDENT_AMBULATORY_CARE_PROVIDER_SITE_OTHER): Payer: Medicare Other | Admitting: Student in an Organized Health Care Education/Training Program

## 2023-03-23 DIAGNOSIS — F32A Depression, unspecified: Secondary | ICD-10-CM | POA: Diagnosis not present

## 2023-03-23 DIAGNOSIS — Z636 Dependent relative needing care at home: Secondary | ICD-10-CM | POA: Diagnosis not present
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
## 2023-04-20 ENCOUNTER — Encounter (HOSPITAL_COMMUNITY): Payer: Self-pay | Admitting: Student in an Organized Health Care Education/Training Program

## 2023-04-20 ENCOUNTER — Ambulatory Visit (INDEPENDENT_AMBULATORY_CARE_PROVIDER_SITE_OTHER): Payer: Medicare Other | Admitting: Student in an Organized Health Care Education/Training Program

## 2023-04-20 DIAGNOSIS — F4321 Adjustment disorder with depressed mood: Secondary | ICD-10-CM

## 2023-04-20 DIAGNOSIS — F32A Depression, unspecified: Secondary | ICD-10-CM | POA: Diagnosis not present

## 2023-04-20 DIAGNOSIS — Z636 Dependent relative needing care at home: Secondary | ICD-10-CM | POA: Diagnosis not present
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
## 2023-05-04 ENCOUNTER — Encounter (HOSPITAL_COMMUNITY): Payer: Self-pay | Admitting: Student in an Organized Health Care Education/Training Program

## 2023-05-04 ENCOUNTER — Ambulatory Visit (INDEPENDENT_AMBULATORY_CARE_PROVIDER_SITE_OTHER): Payer: Medicare Other | Admitting: Student in an Organized Health Care Education/Training Program

## 2023-05-04 DIAGNOSIS — F4321 Adjustment disorder with depressed mood: Secondary | ICD-10-CM | POA: Diagnosis not present

## 2023-05-04 DIAGNOSIS — F32A Depression, unspecified: Secondary | ICD-10-CM | POA: Diagnosis not present

## 2023-05-04 NOTE — Progress Notes (Addendum)
 [facility] [address] [place] [address] [place] [address] Dept: [phone] Dept Fax: [phone]  Psychotherapy Progress Note  Patient ID: Matthew [name], male  DOB: [birth year], 68 y.o.  MRN: [id number]  05/04/2023 Start time: 8:20 AM End time: 8:47 AM  Method of Visit: Face-to-Face  Present: patient  Current Concerns:  He reports things have been busy during the holidays.  He reports he has discovered that he needs to keep busy as it is stress relieving for him.  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  He reports he is excited about this and really looking forward to it.  He reports he has only visited his mother twice once on Christmas and the other on New Year's Day and so has been able to maintain that boundary.  He reports that he and his wife have been trying to get her to a gerontologist for better care.  He also reports she has been depressed and having crying spells and so think getting her to a gerontologist will mean she will be able to get on an antidepressant which may help things.  He reports his wife has scheduled the appointment with the oncologist specialist and the appointment is next week.  He reports he is continuing to do his best by her by being encouraging but not overstepping.  Encouraged him to continue making time for himself and things that make him happy as he is in a very stressful point.  Prior to leaving the appointment he confirmed he was in a stable and safe mindset.  He reports no SI, HI, or AVH.   Current Symptoms: Family Stress and Other: spouse health  Psychiatric Specialty Exam: General Appearance: Casual and Fairly Groomed  Eye Contact:  Good  Speech:  Clear and Coherent and Normal Rate  Volume:  Normal  Mood:   ok     Affect:  Appropriate and Congruent  Thought Process:  Coherent and Goal Directed  Orientation:  Full (Time, Place, and Person)  Thought Content:  WDL and Logical  Suicidal Thoughts:  No  Homicidal Thoughts:  No  Memory:  Immediate;   Good Recent;   Good  Judgement:  Good  Insight:  Good  Psychomotor Activity:  Normal  Concentration:  Concentration: Good and Attention Span: Good  Recall:  Good  Fund of Knowledge:Good  Language: Good  Akathisia:  Negative  Handed:  Right  AIMS (if indicated):  not done  Assets:  Communication Skills Desire for Improvement Housing Resilience Talents/Skills  ADL's:  Intact  Cognition: WNL  Sleep:  Fair     Diagnosis: Depression, Grief   Anticipated Frequency of Visits: weekly Anticipated Length of Treatment Episode: 16  Short Term Goals/Goals for Treatment Session: Ensure protected time for himself and maintain boundaries Progress Towards Goals: Progressing has been playing pickle ball, bee keeping, and playing music  Treatment Intervention: Supportive therapy  Medical Necessity: Improved patient condition  Assessment Tools:     No data to display         Failed to redirect to the Timeline version of the REVFS SmartLink. Flowsheet Row ED from 07/28/2022 in [facility] Emergency Department at [facility] ED to Hosp-Admission (Discharged) from 03/03/2022 in [facility] 67M KIDNEY UNIT ED from 11/19/2021  in [facility] Emergency Department at [facility]  C-SSRS RISK CATEGORY No Risk No Risk No Risk       Collaboration of Care:   Patient/Guardian was advised Release of Information must be obtained prior to any record release in order to collaborate their care with an outside provider. Patient/Guardian was advised if they have not already done so to contact the registration department to sign all necessary forms in order for us  to release information regarding their care.   Consent: Patient/Guardian gives  verbal consent for treatment and assignment of benefits for services provided during this visit. Patient/Guardian expressed understanding and agreed to proceed.   Plan: Provided talk/supportive therapy.  He is continuing to have significant family stressors but is navigating them well.  He is continuing to maintain his barriers with his mother and focusing on his happiness.  Prior to leaving the appointment he confirmed he was in a stable and safe mindset.  He reports no SI, HI, or AVH.    [name], MD 05/04/2023

## 2023-05-18 ENCOUNTER — Encounter (HOSPITAL_COMMUNITY): Payer: Self-pay

## 2023-05-18 ENCOUNTER — Ambulatory Visit (HOSPITAL_COMMUNITY): Payer: Medicare Other | Admitting: Student in an Organized Health Care Education/Training Program

## 2023-05-18 NOTE — Progress Notes (Unsigned)
  [facility] [address] [place] [address] [place] [address] Dept: [phone] Dept Fax: [phone]  Psychotherapy Progress Note  Patient ID: Matthew [name], male  DOB: 1955-06-05, 68 y.o.  MRN: [id number]  05/18/2023 Start time: 8:20 AM End time: 8:47 AM  Method of Visit: Face-to-Face  Present: patient  Current Concerns:  He reports ***   Current Symptoms: Family Stress and Other: spouse health *** Psychiatric Specialty Exam: General Appearance: Casual and Fairly Groomed  Eye Contact:  Good  Speech:  Clear and Coherent and Normal Rate  Volume:  Normal  Mood:   "ok"    Affect:  Appropriate and Congruent  Thought Process:  Coherent and Goal Directed  Orientation:  Full (Time, Place, and Person)  Thought Content:  WDL and Logical  Suicidal Thoughts:  No  Homicidal Thoughts:  No  Memory:  Immediate;   Good Recent;   Good  Judgement:  Good  Insight:  Good  Psychomotor Activity:  Normal  Concentration:  Concentration: Good and Attention Span: Good  Recall:  Good  Fund of Knowledge:Good  Language: Good  Akathisia:  Negative  Handed:  Right  AIMS (if indicated):  not done  Assets:  Communication Skills Desire for Improvement Housing Resilience Talents/Skills  ADL's:  Intact  Cognition: WNL  Sleep:  Fair     Diagnosis: Depression, Grief   Anticipated Frequency of Visits: weekly Anticipated Length of Treatment Episode: 16  Short Term Goals/Goals for Treatment Session: Ensure protected time for himself and maintain boundaries Progress Towards Goals: Progressing has been playing pickle ball, bee keeping, and playing music  Treatment Intervention: Supportive therapy  Medical Necessity: Improved patient condition  Assessment Tools:     No data to display         Failed to redirect to the Timeline version of the REVFS SmartLink. Flowsheet Row ED from 07/28/2022 in [facility] Emergency  Department at [facility] ED to Hosp-Admission (Discharged) from 03/03/2022 in [facility] 37M KIDNEY UNIT ED from 11/19/2021 in [facility] Emergency Department at [facility]  C-SSRS RISK CATEGORY No Risk No Risk No Risk       Collaboration of Care:   Patient/Guardian was advised Release of Information must be obtained prior to any record release in order to collaborate their care with an outside provider. Patient/Guardian was advised if they have not already done so to contact the registration department to sign all necessary forms in order for [place] to release information regarding their care.   Consent: Patient/Guardian gives verbal consent for treatment and assignment of benefits for services provided during this visit. Patient/Guardian expressed understanding and agreed to proceed.   Plan: Provided talk/supportive therapy.  ***    Lauro Franklin, MD 05/18/2023

## 2023-06-04 DIAGNOSIS — G43909 Migraine, unspecified, not intractable, without status migrainosus: Secondary | ICD-10-CM | POA: Diagnosis not present

## 2023-06-04 DIAGNOSIS — E782 Mixed hyperlipidemia: Secondary | ICD-10-CM | POA: Diagnosis not present

## 2023-06-04 DIAGNOSIS — N1831 Chronic kidney disease, stage 3a: Secondary | ICD-10-CM | POA: Diagnosis not present

## 2023-06-04 DIAGNOSIS — I10 Essential (primary) hypertension: Secondary | ICD-10-CM | POA: Diagnosis not present

## 2023-06-04 DIAGNOSIS — Z6823 Body mass index (BMI) 23.0-23.9, adult: Secondary | ICD-10-CM | POA: Diagnosis not present

## 2023-06-08 ENCOUNTER — Ambulatory Visit (HOSPITAL_COMMUNITY): Payer: Medicare Other | Admitting: Student in an Organized Health Care Education/Training Program

## 2023-06-08 NOTE — Progress Notes (Unsigned)
  [facility] [address] [place] [address] [place] [address] Dept: [phone] Dept Fax: [phone]  Psychotherapy Progress Note  Patient ID: Matthew [name], male  DOB: [birth year], 68 y.o.  MRN: [id number]  06/08/2023 Start time: 8:20 AM End time: 8:47 AM  Method of Visit: Face-to-Face  Present: patient  Current Concerns:  He reports ***   Current Symptoms: Family Stress and Other: spouse health *** Psychiatric Specialty Exam: General Appearance: Casual and Fairly Groomed  Eye Contact:  Good  Speech:  Clear and Coherent and Normal Rate  Volume:  Normal  Mood:   ok    Affect:  Appropriate and Congruent  Thought Process:  Coherent and Goal Directed  Orientation:  Full (Time, Place, and Person)  Thought Content:  WDL and Logical  Suicidal Thoughts:  No  Homicidal Thoughts:  No  Memory:  Immediate;   Good Recent;   Good  Judgement:  Good  Insight:  Good  Psychomotor Activity:  Normal  Concentration:  Concentration: Good and Attention Span: Good  Recall:  Good  Fund of Knowledge:Good  Language: Good  Akathisia:  Negative  Handed:  Right  AIMS (if indicated):  not done  Assets:  Communication Skills Desire for Improvement Housing Resilience Talents/Skills  ADL's:  Intact  Cognition: WNL  Sleep:  Fair     Diagnosis: Depression, Grief   Anticipated Frequency of Visits: weekly Anticipated Length of Treatment Episode: 16  Short Term Goals/Goals for Treatment Session: Ensure protected time for himself and maintain boundaries Progress Towards Goals: Progressing has been playing pickle ball, bee keeping, and playing music  Treatment Intervention: Supportive therapy  Medical Necessity: Improved patient condition  Assessment Tools:     No data to display         Failed to redirect to the Timeline version of the REVFS SmartLink. Flowsheet Row ED from 07/28/2022 in [facility] Emergency  Department at [facility] ED to Hosp-Admission (Discharged) from 03/03/2022 in [facility] 17M KIDNEY UNIT ED from 11/19/2021 in [facility] Emergency Department at [facility]  C-SSRS RISK CATEGORY No Risk No Risk No Risk       Collaboration of Care:   Patient/Guardian was advised Release of Information must be obtained prior to any record release in order to collaborate their care with an outside provider. Patient/Guardian was advised if they have not already done so to contact the registration department to sign all necessary forms in order for us  to release information regarding their care.   Consent: Patient/Guardian gives verbal consent for treatment and assignment of benefits for services provided during this visit. Patient/Guardian expressed understanding and agreed to proceed.   Plan: Provided talk/supportive therapy.  ***    [name], MD 06/08/2023

## 2023-06-29 ENCOUNTER — Ambulatory Visit (INDEPENDENT_AMBULATORY_CARE_PROVIDER_SITE_OTHER): Payer: Medicare Other | Admitting: Student in an Organized Health Care Education/Training Program

## 2023-06-29 DIAGNOSIS — F32A Depression, unspecified: Secondary | ICD-10-CM

## 2023-06-29 NOTE — Progress Notes (Addendum)
 [facility] [address] [place] [address] [place] [address] Dept: [phone] Dept Fax: [phone]  Psychotherapy Progress Note  Patient ID: Matthew [name], male  DOB: [birth year], 68 y.o.  MRN: [id number]  06/29/2023 Start time: 9:30 AM End time: 10:17 AM  Method of Visit: Face-to-Face  Present: patient  Current Concerns:  He reports continuing to have familial stressors.  He reports that the first appointment he messed was due to his mother's neighbor calling him and him rushing over there.  He reports that when he got there the neighbor reported that there was an issue she was just seeing what he would do in that situation.  He reports the second appointment he missed was due to their diabetic cat passing away.  He reports that there has been some good news in that his wife's biopsy showed that it was not yet cancer which has been a significant relief to them.  He reports they are still in a bit of a holding pattern unsure of what the next step will be.    He reports that he plans to see if he can fly to [place] this weekend to see his grandson wrestle in the state tournament.  He reports he has been working on keeping his time filled.  He reports playing the banjo and guitar, taking care of his bees, tying flies, and going fishing.  He reports he has been making sure to focus on him first.  He reports that this has been going well and has been helping him see that he still has a lot of life left to live and experience.  Prior to leaving the appointment he confirmed he was in a stable and safe mindset.  He reports no SI, HI, or AVH.     Current Symptoms: Family Stress and Other: spouse health  Psychiatric Specialty Exam: General Appearance: Casual and Fairly Groomed  Eye Contact:  Good  Speech:  Clear and Coherent and Normal Rate  Volume:  Normal  Mood:  Dysphoric   Affect:  Congruent and Tearful   Thought Process:  Coherent and Goal Directed  Orientation:  Full (Time, Place, and Person)  Thought Content:  WDL and Logical  Suicidal Thoughts:  No  Homicidal Thoughts:  No  Memory:  Immediate;   Good Recent;   Good  Judgement:  Good  Insight:  Good  Psychomotor Activity:  Normal  Concentration:  Concentration: Good and Attention Span: Good  Recall:  Good  Fund of Knowledge:Good  Language: Good  Akathisia:  Negative  Handed:  Right  AIMS (if indicated):  not done  Assets:  Communication Skills Desire for Improvement Housing Resilience Talents/Skills  ADL's:  Intact  Cognition: WNL  Sleep:  Fair     Diagnosis: Depression, Grief   Anticipated Frequency of Visits: weekly Anticipated Length of Treatment Episode: 16  Short Term Goals/Goals for Treatment Session: Ensure protected time for himself and maintain boundaries Progress Towards Goals: Progressing has been ensuring time for himself to do what he wants to do  Treatment Intervention: Supportive therapy  Medical Necessity: Improved patient condition  Assessment Tools:     No data to display         Failed to redirect to the Timeline version of the REVFS SmartLink. Flowsheet Row ED from 07/28/2022 in [facility] Emergency Department at [facility] ED to Hosp-Admission (Discharged) from 03/03/2022 in [facility] 24M KIDNEY UNIT ED from 11/19/2021 in [facility]  Emergency Department at [facility]  C-SSRS RISK CATEGORY No Risk No Risk No Risk       Collaboration of Care:   Patient/Guardian was advised Release of Information must be obtained prior to any record release in order to collaborate their care with an outside provider. Patient/Guardian was advised if they have not already done so to contact the registration department to sign all necessary forms in order for [place] to release information regarding their care.   Consent: Patient/Guardian gives verbal consent for treatment and  assignment of benefits for services provided during this visit. Patient/Guardian expressed understanding and agreed to proceed.   Plan: Provided talk/supportive therapy.  He has been working on ensuring time for himself and doing what he wants to do.  He will continue with this approach.  Prior to leaving the appointment he confirmed he was in a stable and safe mindset.  He reports no SI, HI, or AVH.    Lauro Franklin, MD 06/29/2023

## 2023-07-16 DIAGNOSIS — H401131 Primary open-angle glaucoma, bilateral, mild stage: Secondary | ICD-10-CM | POA: Diagnosis not present

## 2023-08-03 ENCOUNTER — Ambulatory Visit (HOSPITAL_COMMUNITY): Admitting: Student in an Organized Health Care Education/Training Program

## 2023-08-13 DIAGNOSIS — H401131 Primary open-angle glaucoma, bilateral, mild stage: Secondary | ICD-10-CM | POA: Diagnosis not present

## 2023-08-24 ENCOUNTER — Ambulatory Visit (INDEPENDENT_AMBULATORY_CARE_PROVIDER_SITE_OTHER): Admitting: Student in an Organized Health Care Education/Training Program

## 2023-08-24 ENCOUNTER — Encounter (HOSPITAL_COMMUNITY): Payer: Self-pay | Admitting: Student in an Organized Health Care Education/Training Program

## 2023-08-24 DIAGNOSIS — F32A Depression, unspecified: Secondary | ICD-10-CM

## 2023-08-24 DIAGNOSIS — Z636 Dependent relative needing care at home: Secondary | ICD-10-CM

## 2023-08-24 NOTE — Progress Notes (Signed)
 [facility] [address] [place] [address] [place] [address] Dept: [phone] Dept Fax: [phone]  Psychotherapy Progress Note  Patient ID: Matthew [name], male  DOB: 1955-11-04, 68 y.o.  MRN: [id number]  08/24/2023 Start time: 9:30 AM End time: 10:16 AM  Method of Visit: Face-to-Face  Present: patient  Current Concerns:  He reports that there have been some overall positive improvements since his last appointment.  He reports that he missed his last appointment because his mother-in-law wanted to drive to see one of her daughters in [place]  and took her so that she would not be driving given her age.  He reports that he and his wife took her down and it was a good trip.  He reports [place] of [place].  He reports that recently he was contacted by a friend that there was a bee swarm near daycare.  He reports he was able to locate the clean and relocate the swarm to one of his hives.  He reports that this was a lot of fun and he had a lot of enjoyment doing this.  He reports that the major change was having a conversation with [name].  He reports that they sat down and talked about everything that had been going on and the result was [name] did reach out to his daughter and his mother.  He reports that this has been a significant step and he is hopeful that this will continue.  He also reports that things with his mother have improved in another way as well.  He reports that his sister is now visiting or calling every Sunday when prior she would visit for 5 minutes once a month to write checks.  She reports that the result of both his sister and his wife's actions his mother has been happier and better.  Prior to leaving the appointment he confirmed he was in a stable and safe mindset.  He reports no SI, HI, or AVH.   Current Symptoms: Family Stress  improved  Psychiatric Specialty Exam: General Appearance: Casual and Fairly Groomed  Eye Contact:  Good  Speech:  Clear and Coherent and Normal Rate  Volume:  Normal  Mood:  Euthymic   Affect:  Appropriate and Congruent  Thought Process:  Coherent and Goal Directed  Orientation:  Full (Time, Place, and Person)  Thought Content:  WDL and Logical  Suicidal Thoughts:  No  Homicidal Thoughts:  No  Memory:  Immediate;   Good Recent;   Good  Judgement:  Good  Insight:  Good  Psychomotor Activity:  Normal  Concentration:  Concentration: Good and Attention Span: Good  Recall:  Good  Fund of Knowledge:Good  Language: Good  Akathisia:  Negative  Handed:  Right  AIMS (if indicated):  not done  Assets:  Communication Skills Desire for Improvement Housing Resilience Talents/Skills  ADL's:  Intact  Cognition: WNL  Sleep:  Good     Diagnosis: Depression, Grief   Anticipated Frequency of Visits: weekly Anticipated Length of Treatment Episode: 16  Short Term Goals/Goals for Treatment Session: Continue discussion with [name] Progress Towards Goals: Initial   Treatment Intervention: Supportive therapy  Medical Necessity: Improved patient condition  Assessment Tools:     No data to display         Failed to redirect to the Timeline version of the REVFS SmartLink. Flowsheet Row ED from 07/28/2022 in [facility]  Emergency Department at [facility] ED to Hosp-Admission (Discharged) from 03/03/2022 in [facility] 49M KIDNEY UNIT ED from 11/19/2021 in [facility] Emergency Department at [facility]  C-SSRS RISK CATEGORY No Risk No Risk No Risk       Collaboration of Care:   Patient/Guardian was advised Release of Information must be obtained prior to any record release in order to collaborate their care with an outside provider. Patient/Guardian was advised if they have not already done so to contact the registration department to sign all necessary forms  in order for us  to release information regarding their care.   Consent: Patient/Guardian gives verbal consent for treatment and assignment of benefits for services provided during this visit. Patient/Guardian expressed understanding and agreed to proceed.   Plan: Provided talk/supportive therapy.  He has had multiple positive changes.  He will work on continuing these changes/improvements.  Prior to leaving the appointment he confirmed he was in a stable and safe mindset.  He reports no SI, HI, or AVH.    [name], MD 08/24/2023

## 2023-09-14 ENCOUNTER — Ambulatory Visit (INDEPENDENT_AMBULATORY_CARE_PROVIDER_SITE_OTHER): Admitting: Student in an Organized Health Care Education/Training Program

## 2023-09-14 ENCOUNTER — Encounter (HOSPITAL_COMMUNITY): Payer: Self-pay | Admitting: Student in an Organized Health Care Education/Training Program

## 2023-09-14 DIAGNOSIS — F32A Depression, unspecified: Secondary | ICD-10-CM | POA: Diagnosis not present

## 2023-09-14 DIAGNOSIS — Z636 Dependent relative needing care at home: Secondary | ICD-10-CM | POA: Diagnosis not present

## 2023-09-14 NOTE — Progress Notes (Signed)
 [facility] [address] [place] [address] [place] [address] Dept: [phone] Dept Fax: [phone]  Psychotherapy Progress Note  Patient ID: Matthew [name], male  DOB: 04-10-1956, 68 y.o.  MRN: [id number]  09/14/2023 Start time: 9:02 AM End time: 9:48 AM  Method of Visit: Face-to-Face  Present: patient  Current Concerns:  He reports that things have been going well for he has been keeping.  He reports that since his last appointment he has caught another swarm and now has 8 hives.  He reports there has been some trouble with getting to his tools because the shed they were stored in they have not been able to open because of something wrong with the lock so he has had to go get his older tools that he was storing elsewhere.  He reports he is continuing to play pickle ball and it is still continuing to be his favorite thing that he does.  He reports going to different [facility] and outdoor courts throughout the week.  He reports even participating in some tournaments.  He reports that Mother's Day was tough.  He reports that his wife [name] continues to have issues with her daughters.  He reports that [name] has not even met 2 of her grandchildren due to the ongoing issues.  He reports things with his mother continue to be tough.  He reports a sister who had been going over once a week every Sunday has not done it for a few weeks now.  He reports that she has hired a Theatre stage manager to spend 4 hours a day 4 times a week with her which has been a big benefit to their mother.  He reports issues with her memory continue to be an issue but that he did get a daily calendar so she could write down things like who called/visited and she has liked this.  He reports continued trouble with talking on the phone with her.  He reports that her hearing continues to be big barrier.  He reports getting a phone that would display text but due to  her fear of technology she would not use it.  He reports this continues to be a big stressor to him as the phone calls are at least 30 minutes long but usually last up to 2 hours.  Prior to leaving the appointment he confirmed he was in a stable and safe mindset.  He reports no SI, HI, or AVH.    Current Symptoms: Family Stress improved  Psychiatric Specialty Exam: General Appearance: Casual and Fairly Groomed  Eye Contact:  Good  Speech:  Clear and Coherent and Normal Rate  Volume:  Normal  Mood:  "ok"   Affect:  Appropriate and Congruent  Thought Process:  Coherent and Goal Directed  Orientation:  Full (Time, Place, and Person)  Thought Content:  WDL and Logical  Suicidal Thoughts:  No  Homicidal Thoughts:  No  Memory:  Immediate;   Good Recent;   Good  Judgement:  Good  Insight:  Good  Psychomotor Activity:  Normal  Concentration:  Concentration: Good and Attention Span: Good  Recall:  Good  Fund of Knowledge:Good  Language: Good  Akathisia:  Negative  Handed:  Right  AIMS (if indicated):  not done  Assets:  Communication Skills Desire for Improvement Housing Resilience Talents/Skills  ADL's:  Intact  Cognition: WNL  Sleep:  Good     Diagnosis: Depression, Grief   Anticipated Frequency of Visits:  weekly Anticipated Length of Treatment Episode: 16  Short Term Goals/Goals for Treatment Session: Continue working with sister on mothers situation Progress Towards Goals: Initial   Treatment Intervention: Supportive therapy  Medical Necessity: Improved patient condition  Assessment Tools:     No data to display         Failed to redirect to the Timeline version of the REVFS SmartLink. Flowsheet Row ED from 07/28/2022 in [facility] Emergency Department at [facility] ED to Hosp-Admission (Discharged) from 03/03/2022 in [facility] 85M KIDNEY UNIT ED from 11/19/2021 in [facility] Emergency Department at [facility]  C-SSRS RISK  CATEGORY No Risk No Risk No Risk       Collaboration of Care:   Patient/Guardian was advised Release of Information must be obtained prior to any record release in order to collaborate their care with an outside provider. Patient/Guardian was advised if they have not already done so to contact the registration department to sign all necessary forms in order for us  to release information regarding their care.   Consent: Patient/Guardian gives verbal consent for treatment and assignment of benefits for services provided during this visit. Patient/Guardian expressed understanding and agreed to proceed.   Plan: Provided talk/supportive therapy.  The keeping and pickle ball continue to be a significant boost to his mood.  Issues with his mother do persist in adding burdens.  He will continue to work with his sister on best steps forward.  Prior to leaving the appointment he confirmed he was in a stable and safe mindset.  He reports no SI, HI, or AVH.     [name], MD 09/14/2023

## 2023-10-05 ENCOUNTER — Encounter (HOSPITAL_COMMUNITY): Payer: Self-pay | Admitting: Student in an Organized Health Care Education/Training Program

## 2023-10-05 ENCOUNTER — Ambulatory Visit (INDEPENDENT_AMBULATORY_CARE_PROVIDER_SITE_OTHER): Admitting: Student in an Organized Health Care Education/Training Program

## 2023-10-05 DIAGNOSIS — F32A Depression, unspecified: Secondary | ICD-10-CM | POA: Diagnosis not present

## 2023-10-05 NOTE — Progress Notes (Signed)
 [facility] [address] [place] [address] [place] [address] Dept: [phone] Dept Fax: [phone]  Psychotherapy Progress Note  Patient ID: Matthew [name], male  DOB: Jan 03, 1956, 67 y.o.  MRN: [id number]  10/05/2023 Start time: 9:36 AM End time: 10:20 AM  Method of Visit: Face-to-Face  Present: patient  Current Concerns:  He reports things have continued to be okay with the bee hives.  He reports that the shed with all of his tools still has a broken lock and so he cannot get in.  He reports that the person who owns the land decided to give one of the hives away to his daughter/son-in-law.  He reports that because of this he has started thinking about moving to a different plot of land and has started working on this.  He reports that pickle ball continues to be a major positive spot in his life.  He reports that he has been trying to play in tournaments and continue expanding.  He reports that 1 make changes his wife has recently started playing with him.  He reports that almost every afternoon they will go play as he is teaching her how to.  He reports she is still not wanting to play with anyone else at the moment but is having a lot of fun doing this together.  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  He reports that they are meeting with federal and state EPA members next week to discuss their findings and what happens next.  He reports that him and his wife been working on this for a long time and it is very exciting to be reaching this point.  He reports continuing to have stress over the situation with his mother.  He reports that his sister paying for an aid to come has been a significant relief for him.  Prior to leaving the appointment he confirmed he was in a stable and safe mindset.  He reports no SI, HI, or AVH.    Current Symptoms: Family Stress improved   Psychiatric  Specialty Exam: General Appearance: Casual and Fairly Groomed  Eye Contact:  Good  Speech:  Clear and Coherent and Normal Rate  Volume:  Normal  Mood:  "ok"   Affect:  Appropriate and Congruent  Thought Process:  Coherent and Goal Directed  Orientation:  Full (Time, Place, and Person)  Thought Content:  WDL and Logical  Suicidal Thoughts:  No  Homicidal Thoughts:  No  Memory:  Immediate;   Good Recent;   Good  Judgement:  Good  Insight:  Good  Psychomotor Activity:  Normal  Concentration:  Concentration: Good and Attention Span: Good  Recall:  Good  Fund of Knowledge:Good  Language: Good  Akathisia:  Negative  Handed:  Right  AIMS (if indicated):  not done  Assets:  Communication Skills Desire for Improvement Housing Resilience Talents/Skills  ADL's:  Intact  Cognition: WNL  Sleep:  Good     Diagnosis: Depression, Grief   Anticipated Frequency of Visits: weekly Anticipated Length of Treatment Episode: 16  Short Term Goals/Goals for Treatment Session: Continue improving relationship with wife Progress Towards Goals: Initial   Treatment Intervention: Supportive therapy  Medical Necessity: Assisted patient to achieve or maintain maximum functional capacity  Assessment Tools:     No data to display         Failed to redirect to the Timeline version of the REVFS SmartLink.  Flowsheet Row ED from 07/28/2022 in [facility] Emergency Department at [facility] ED to Hosp-Admission (Discharged) from 03/03/2022 in [facility] 39M KIDNEY UNIT ED from 11/19/2021 in [facility] Emergency Department at [facility]  C-SSRS RISK CATEGORY No Risk No Risk No Risk       Collaboration of Care:   Patient/Guardian was advised Release of Information must be obtained prior to any record release in order to collaborate their care with an outside provider. Patient/Guardian was advised if they have not already done so to contact the registration department to  sign all necessary forms in order for us  to release information regarding their care.   Consent: Patient/Guardian gives verbal consent for treatment and assignment of benefits for services provided during this visit. Patient/Guardian expressed understanding and agreed to proceed.   Plan: Provided talk/supportive therapy.  He does continue to have stress over his mother's situation.  However, he and his wife have started to play pickle ball together and this has significantly improved their relationship.  He will continue with this as it has made a lot of things better.  Prior to leaving the appointment he confirmed he was in a stable and safe mindset.  He reports no SI, HI, or AVH.    [name], DO 10/05/2023

## 2023-12-04 ENCOUNTER — Ambulatory Visit (INDEPENDENT_AMBULATORY_CARE_PROVIDER_SITE_OTHER): Admitting: Student in an Organized Health Care Education/Training Program

## 2023-12-04 ENCOUNTER — Encounter (HOSPITAL_COMMUNITY): Payer: Self-pay | Admitting: Student in an Organized Health Care Education/Training Program

## 2023-12-04 DIAGNOSIS — F32A Depression, unspecified: Secondary | ICD-10-CM

## 2023-12-04 DIAGNOSIS — Z636 Dependent relative needing care at home: Secondary | ICD-10-CM | POA: Diagnosis not present

## 2023-12-04 NOTE — Progress Notes (Signed)
 [facility] [address] [place] [address] [place] [address] Dept: [phone] Dept Fax: [phone]  Psychotherapy Progress Note  Patient ID: Matthew [name], male  DOB: February 14, 1956, 68 y.o.  MRN: [id number]  12/04/2023 Start time: 3:57 AM End time: 4:45 AM  Method of Visit: Face-to-Face  Present: patient  Current Concerns:  He reports things have been better most recently.  He reports that he has started doing tai chi.  He reports that he has been doing it daily and found it very relaxing.  He reports that his blood pressure has improved and has even been able to stop one of his medications.  He reports he has been doing it daily and this has made a significant improvement.  He reports that his mom is still a significant source of stress.  He reports that he has been working on letting go of his anger at himself over not being able to do everything for her.  He reports that he has been working on rationalizing his thoughts and feelings.  He reports that he recently went to his 50th high school reunion and reunion for [employer].  He reports that while it was stressful leading up to those events he is happy that he went.  He reports that ultimately both of them were a fun time.  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  He reports he knows that he is doing something important and this has been helpful for him.  He reports that his wife is running for 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  Prior to leaving the appointment she confirmed she was in a stable and safe mindset.  She reports no SI, HI, or AVH.    Current Symptoms: Family Stress    Psychiatric Specialty Exam: General Appearance: Casual and Fairly Groomed  Eye Contact:  Good  Speech:  Clear and Coherent and Normal Rate  Volume:  Normal  Mood:  Euthymic   Affect:  Appropriate and  Congruent  Thought Process:  Coherent and Goal Directed  Orientation:  Full (Time, Place, and Person)  Thought Content:  WDL and Logical  Suicidal Thoughts:  No  Homicidal Thoughts:  No  Memory:  Immediate;   Good Recent;   Good  Judgement:  Good  Insight:  Good  Psychomotor Activity:  Normal  Concentration:  Concentration: Good and Attention Span: Good  Recall:  Good  Fund of Knowledge:Good  Language: Good  Akathisia:  Negative  Handed:  Right  AIMS (if indicated):  not done  Assets:  Communication Skills Desire for Improvement Housing Resilience Talents/Skills  ADL's:  Intact  Cognition: WNL  Sleep:  Good     Diagnosis: Depression, Grief   Anticipated Frequency of Visits: weekly Anticipated Length of Treatment Episode: 16  Short Term Goals/Goals for Treatment Session: Continue improving relationship with wife Progress Towards Goals: Progressing as 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 Flowsheet Row ED from 07/28/2022 in [facility] Emergency Department at [facility] ED to Hosp-Admission (Discharged) from 03/03/2022 in [facility] 5M KIDNEY UNIT ED from 11/19/2021 in [facility] Emergency Department  at [facility]  C-SSRS RISK CATEGORY No Risk No Risk No Risk    Collaboration of Care:   Patient/Guardian was advised Release of Information must be obtained prior to any record release in order to collaborate their care with an outside provider. Patient/Guardian was advised if they have not already done so to contact the registration department to sign all necessary forms in order for us  to release information regarding their care.   Consent: Patient/Guardian gives verbal consent for treatment  and assignment of benefits for services provided during this visit. Patient/Guardian expressed understanding and agreed to proceed.   Plan: Provided talk/supportive therapy.  He has started doing tai chi which has helped reduce his stress and blood pressure.  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  Prior to leaving the appointment he confirmed he was in a stable and safe mindset.  He reports no SI, HI, or AVH.   [name], DO 12/04/2023

## 2023-12-13 DIAGNOSIS — H401131 Primary open-angle glaucoma, bilateral, mild stage: Secondary | ICD-10-CM | POA: Diagnosis not present

## 2023-12-17 NOTE — Progress Notes (Unsigned)
 [facility] [address] [place]. [place] [address] [phone]       OFFICE FLLOW UP VISIT NOTE  [name] Date of Birth:  05-Jul-1955 Medical Record Number:  [id number]   Referring MD: Matthew [name]   Primary neurologist: Dr. Rosemarie Reason for Referral: Seizure    HPI:   Initial visit 07/08/2020 Dr. Rosemarie: Matthew [name] is a 68 year old Caucasian male seen today for initial office consultation visit for seizure.  History is obtained from the patient and review of electronic medical records and I have personally reviewed pertinent imaging films in PACS.  He has a past medical history of anxiety and bipolar disorder, migraines, hypertension, panic attacks and substance abuse.  He was seen in the ER on 05/04/2020 when he developed an episode in which he stated he his eyes deviated tonically to the left and it was uncomfortable he tried to look back across and walk and stumbled and fell and had a severe concussion with frontal and periorbital swelling with brief loss of consciousness.  Some confusion after that but gradually improved by the time he came to the ER.  On inquiry he and his wife admitted he had had for the last few well several episodes in which he felt subjectively his eyes were looking in different directions and he could not control them.  During these episodes her wife noticed that he would stare into the distance and make abnormal breathing type sound.  Episodes of fairly stereotypical occurring several times a year.  He would occasionally rapidly blink during these episodes.  Patient has a history of bipolar disorder and was on lamotrigine  and lithium  and had apparently stopped this medication a month prior to the last episode in January.  Is lithium  and lamotrigine  levels were undetectable on 05/05/2020.  EEG was normal.  MRI scan of the brain showed no acute brain abnormality.  There are mild changes of chronic small vessel disease.  Incidental 1.2 cm  midline posterior nasopharynx cyst was noted.  Right frontoparietal and periorbital contusion was also noted.  Patient also has history of migraine headaches and he takes Topamax  200 mg daily for prophylaxis it seems to have worked quite well and migraines in the down to once every couple of months.  He was recently seen by psychiatrist to seem quite content with his current medication for bipolar and did not suggest any changes.  Patient has no new complaints today.  He has no prior history of seizures, strokes, TIAs, significant head injury with loss of consciousness except the most recent one.  No childhood history of febrile seizures family history of epilepsy. Update 10/28/2020 Dr. Rosemarie; He returns for follow-up after last visit 3 months ago.  Is accompanied by his wife.  He is doing well and has not had any generalized seizures or staring episodes.  He remains on Lamictal  200 mg daily she is tolerating well without side effects.  He is also on Topamax  200 mg daily and his migraines are quite well controlled and occur only once every couple of months.  He is a decreased appetite and has been losing weight.  Patient continues to have mild short-term memory difficulties but these are not progressive.  He states his bipolar symptoms are quite stable and he has regular follow-up with his psychiatrist.  He had follow-up EEG on 08/05/2020 which was also normal.  He has no new complaints today.   Update 02/10/2021 JM: Returns for 48-month seizure follow-up.  Overall stable without  any reoccurring seizure events.  Remains on lamotrigine  (prior 200mg  daily but per pt, was changed to either 100mg  BID or 150mg  BID by Dr. Rosemarie - unable to verify this via epic).  No side effects.  Remains on topiramate  100 mg daily for migraine prophylaxis. No migraine occurrence since decreasing dosage from 200 mg to 100 mg and does report excellent improvement of his memory - still not perfect but greatly improved.  Routinely followed  by psychiatry and PCP.  No concerns today.    Update 07/13/2021 JM: Patient being seen for recent hospital follow-up accompanied by his wife.  He presented to ED on 07/05/2021 with confusion and garbled speech and some aphasia and then separate episode shortly after consisting of staring off, not responding, and stumbled in hallway at home.  He received TNK due to stroke concern, MRI and CTA unremarkable.  EEG showed epileptogenicity and cortical dysfunction arising from left frontotemporal region.  Episode initially concerning for stroke but after work-up, more concerning for seizure therefore increased lamotrigine  to 200 mg twice daily.  Topiramate  discontinued.  He was discharged home on 3/9.  Since discharge, he has been doing well. Does have some fatigue especially after increased exertion.  He has had no additional seizure activity and tolerating higher dose of lamotrigine  without side effects. Wife mentions frequent panic attacks, closely followed by psychiatry. He will use clonazepam  occasionally for panic attacks but does report increased fatigue after taking.  He has not had any migraines or headaches since discontinuing topiramate .   EEG 07/06/2021- epileptogenicity and cortical dysfunction arising from left frontotemporal region. Additionally there is mild diffuse encephalopathy, nonspecific etiology. No seizures were seen throughout the recording.    Update 11/21/2021 JM: Patient returns for 67-month seizure follow-up accompanied by his wife  Evaluated in ED 7/2 with 1 to 2-week progression of confusion, falling and weakness likely in setting of toxic metabolic encephalopathy due to polypharmacy and lithium  OD. Per note review, psychiatrist made adjustments to medication regimen with decreasing lamotrigine  dosage and adding Depakote and Seroquel in addition to lithium  and as needed Sonata  and Klonopin .  There was concern of unintentional overdosing with these multiple medications.  CT head no  acute findings.  Medication adjustments made.  Depakote and Seroquel discontinued as well as topiramate  (although this had been previously d/c'd).  Lithium  levels normalized and mental status gradually returned to baseline.  Evaluated by psychiatry and neurology.  Discharged on lamotrigine  200 mg twice daily (prior dosage for seizure prevention). Advised outpatient neuro f/u as well as PCP/psychiatry follow-up.   He has been stable since discharge but still feels a generalized weakness. Denies any recent seizure activity.  Currently taking lamotrigine  200 mg twice daily without side effects.   He has not had any recent migraine headaches, he is not currently on any prophylactic medications  Previously being followed by a psychiatrist in [place]  with in person visits 3-4 times per year otherwise completed via virtual visits. Due to recent event, they are now in the process of changing psychiatrist and is scheduled to see Dr. Raliegh on 7/31.  Wife has concerns regarding medications that were prescribed by psychiatry as well as by this office as there was duplicate medications (psychiatry was also prescribing lamotrigine ).   Of note, wife is also concerned regarding reported history of substance abuse on patient's chart most notably use of cocaine.  He did have positive UDS for cocaine metabolites back in 2009 and 2011 but he denies any active use during that time.  Recent UDS negative for cocaine use.  He does admit to occasional THC use but no other drug use. His recent UDS was positive for opiates and per wife, she gave him a half tab of oxycodone  (which was from an old prescription 2 years ago) due to severe lower abdominal pain which was eventually found to have enlarged prostate, he is scheduled to see urology next month.  He does not routinely take any type of opiates.  No further concerns at this time  Update 04/17/2022 JM: Patient returns for follow-up visit after prior visit 5 months ago  accompanied by his wife.  Stable since prior visit.  Denies any reoccurring seizure activity.  Remains on lamotrigine  200 mg twice daily.  He has not had any recent migraine headaches.  He has since establish care with behavioral health in this area.  He underwent bilateral hernia repair surgery on 11/1 and presented to ED on 11/3 with fever, abdominal pain, AMS, N/V and headache.  Found to have pneumoperitoneum likely postop complication.  Evaluated by neurology as initial concern for seizures, EEG negative for seizures. CT head completed for headache which was negative.  Received migraine cocktail.  He returned home after 6-day stay.  Feels he is completely back to baseline and is closely followed by general surgery.  Update 12/18/2022 JM: Returns for follow-up visit unaccompanied.  Stable without any reoccurring seizure activity.  Remains on lamotrigine  200 mg twice daily, reports tolerating well. Denies any recent headaches. Routinely followed by behavioral health.  Routinely follows with PCP, reports recent lab work showed elevated PSA, is scheduled next week to undergo cystoscopy with [facility] for further evaluation.    Update 12/18/2023 JM: Patient returns for yearly follow-up visit.  Overall stable from neurological standpoint.  Denies any recurrent seizure activity.  Reports compliance on lamotrigine  200 mg twice daily without side effects.        ROS:   14 system review of systems is positive for those listed in HPI and all other systems negative  PMH:  Past Medical History:  Diagnosis Date   Anxiety    on meds   AVM (arteriovenous malformation) of colon 02/04/2021   Bipolar I disorder, most recent episode (or current) mixed, moderate    GERD (gastroesophageal reflux disease)    Hypertension    on meds   Migraines    OSA (obstructive sleep apnea)    retested-bipap not needed   Panic attacks    Seizures (HCC)    last known 05/2020;   Substance abuse (HCC)      Social History:  Social History   Socioeconomic History   Marital status: Married    Spouse name: Matthew [name]   Number of children: Not on file   Years of education: Not on file   Highest education level: Not on file  Occupational History   Occupation: Disability    Comment: Bipolar disorder  Tobacco Use   Smoking status: Former    Types: Pipe, Cigars   Smokeless tobacco: Never  Vaping Use   Vaping status: Never Used  Substance and Sexual Activity   Alcohol use: No   Drug use: No    Types: Cocaine    Comment: past history of use-   Sexual activity: Not on file  Other Topics Concern   Not on file  Social History Narrative   Lives with wife   Right Handed   Drinks 3-4 cups caffeine  daily      Social Drivers of Health  Financial Resource Strain: Not on file  Food Insecurity: Not on file  Transportation Needs: No Transportation Needs (03/03/2022)   PRAPARE - Administrator, Civil Service (Medical): No    Lack of Transportation (Non-Medical): No  Physical Activity: Not on file  Stress: Not on file  Social Connections: Not on file  Intimate Partner Violence: Patient Declined (03/03/2022)   Humiliation, Afraid, Rape, and Kick questionnaire    Fear of Current or Ex-Partner: Patient declined    Emotionally Abused: Patient declined    Physically Abused: Patient declined    Sexually Abused: Patient declined    Medications:   Current Outpatient Medications on File Prior to Visit  Medication Sig Dispense Refill   amLODipine  (NORVASC ) 10 MG tablet Take 1 tablet (10 mg total) by mouth daily. 30 tablet 0   Calcium  Carbonate Antacid (TUMS PO) Take 1 tablet by mouth daily as needed (heartburn).     carvedilol  (COREG ) 6.25 MG tablet Take 1 tablet (6.25 mg total) by mouth 2 (two) times daily with a meal. 60 tablet 0   Cholecalciferol (VITAMIN D3) 250 MCG (10000 UT) capsule Take 10,000 Units by mouth once a week.     clonazePAM  (KLONOPIN ) 1 MG tablet Take 1 tablet (1 mg  total) by mouth daily as needed (for severe anxiety or agitation). 5 tablet 0   HYDROcodone -acetaminophen  (NORCO) 7.5-325 MG tablet Take 1 tablet by mouth every 6 (six) hours as needed for moderate pain. Takes as needed - this is from an old rx.     lamoTRIgine  (LAMICTAL ) 200 MG tablet Take 1 tablet (200 mg total) by mouth 2 (two) times daily. 180 tablet 3   losartan (COZAAR) 25 MG tablet Take 25 mg by mouth daily.     methocarbamol  (ROBAXIN ) 500 MG tablet Take 1 tablet (500 mg total) by mouth every 6 (six) hours as needed for muscle spasms. 30 tablet 0   polyethylene glycol (MIRALAX  / GLYCOLAX ) 17 g packet Take 17 g by mouth daily. 14 each 0   zaleplon  (SONATA ) 10 MG capsule Take 1 capsule (10 mg total) by mouth at bedtime as needed for sleep. 30 capsule 0   No current facility-administered medications on file prior to visit.    Allergies:   Allergies  Allergen Reactions   Depakote [Divalproex Sodium] Nausea And Vomiting   Seroquel [Quetiapine Fumarate] Nausea And Vomiting   Sumatriptan  Other (See Comments)     dizziness   Tramadol     Severe dizziness, restless leg, jittery    Physical Exam There were no vitals filed for this visit.  There is no height or weight on file to calculate BMI.    General: well developed, well nourished very pleasant middle-aged Caucasian male, seated, in no evident distress Head: head normocephalic and atraumatic.   Neck: supple with no carotid or supraclavicular bruits Cardiovascular: regular rate and rhythm, no murmurs Musculoskeletal: no deformity Skin:  no rash/petichiae Vascular:  Normal pulses all extremities  Neurologic Exam Mental Status: Awake and fully alert.  Fluent speech and language.  Oriented to place and time. Recent and remote memory intact. Attention span, concentration and fund of knowledge appropriate. Mood and affect appropriate.  Cranial Nerves: Pupils equal, briskly reactive to light. Extraocular movements full without  nystagmus. Visual fields full to confrontation. Hearing intact. Facial sensation intact. Face, tongue, palate moves normally and symmetrically.  Motor: Normal bulk and tone. Normal strength in all tested extremity muscles. Sensory.: intact to touch , pinprick , position and  vibratory sensation.  Coordination: Rapid alternating movements normal in all extremities. Finger-to-nose and heel-to-shin performed accurately bilaterally. Gait and Station: Arises from chair without difficulty. Stance is normal. Gait demonstrates normal stride length and balance without use of assistive device. Able to heel, toe and tandem walk with mild difficulty.  Reflexes: 1+ and symmetric. Toes downgoing.       ASSESSMENT: 68 year old Caucasian male with recurrent stereotypical episodes likely complex partial seizures.  In January 2022 he had severe tonic deviation of his eyes to the left, fell and developed a contusion with brief loss of consciousness.  He had been on lamotrigine  and lithium  for bipolar disorder which he had discontinued a month prior which may have triggered a more severe episode.  Recurrent seizure mimicking stroke 07/05/2021 (see HPI).  Recent hospitalization 7/2 for toxic encephalopathy most likely from polypharmacy and lithium  OD (unintentionally)    PLAN:  1.  Seizure disorder 2.  Complex partial seizures -Continue lamotrigine  200 mg twice daily-refill provided.  -Repeat lamotrigine  level today - prior Lamotrigine  level 8.9 (11/2022)  -EEG 11/2021 normal, no focal lateralizing or epileptiform features -EEG 06/2021 epileptogenicity and cortical dysfunction arising from left frontal temporal region -avoid seizure provoking stimuli like medication noncompliance, sleep deprivation and extremes of activity.   -Advised to call office with any seizure activity   3.  Chronic migraine headaches -No recent migraine headaches, continue to monitor      Follow-up in 1 year or call earlier if  needed    CC:  Marvene Prentice SAUNDERS, FNP   I personally spent a total of *** minutes in the care of the patient today including {Time Based Coding:210964241}.   Harlene Bogaert, AGNP-BC  [facility] [address] [place], [place] [address]  Phone [phone] Fax [phone] Note: This document was prepared with digital dictation and possible smart phrase technology. Any transcriptional errors that result from this process are unintentional.

## 2023-12-18 ENCOUNTER — Ambulatory Visit: Payer: Medicare Other | Admitting: Adult Health

## 2023-12-18 ENCOUNTER — Encounter: Payer: Self-pay | Admitting: Adult Health

## 2023-12-18 VITALS — BP 132/81 | HR 49 | Ht 70.0 in | Wt 155.0 lb

## 2023-12-18 DIAGNOSIS — Z5181 Encounter for therapeutic drug level monitoring: Secondary | ICD-10-CM | POA: Diagnosis not present

## 2023-12-18 DIAGNOSIS — G40909 Epilepsy, unspecified, not intractable, without status epilepticus: Secondary | ICD-10-CM

## 2023-12-18 DIAGNOSIS — G8929 Other chronic pain: Secondary | ICD-10-CM

## 2023-12-18 DIAGNOSIS — R519 Headache, unspecified: Secondary | ICD-10-CM | POA: Diagnosis not present

## 2023-12-18 MED ORDER — LAMOTRIGINE 200 MG PO TABS: 200.0000 mg | ORAL_TABLET | Freq: Two times a day (BID) | ORAL | 3 refills | Status: AC

## 2023-12-18 NOTE — Patient Instructions (Addendum)
 Your Plan:  Continue lamotrigine  200mg  twice daily for seizure prevention  Please follow up with your PCP next week, will request lamotrigine  level checked at that time     Follow up in 1 year or call earlier if needed       Thank you for coming to see us  at [facility]. I hope we have been able to provide you high quality care today.  You may receive a patient satisfaction survey over the next few weeks. We would appreciate your feedback and comments so that we may continue to improve ourselves and the health of our patients.

## 2023-12-20 ENCOUNTER — Encounter: Payer: Self-pay | Admitting: Neurology

## 2023-12-25 DIAGNOSIS — E782 Mixed hyperlipidemia: Secondary | ICD-10-CM | POA: Diagnosis not present

## 2023-12-25 DIAGNOSIS — N1831 Chronic kidney disease, stage 3a: Secondary | ICD-10-CM | POA: Diagnosis not present

## 2023-12-25 DIAGNOSIS — Z0000 Encounter for general adult medical examination without abnormal findings: Secondary | ICD-10-CM | POA: Diagnosis not present

## 2023-12-25 DIAGNOSIS — D649 Anemia, unspecified: Secondary | ICD-10-CM | POA: Diagnosis not present

## 2023-12-25 DIAGNOSIS — G43909 Migraine, unspecified, not intractable, without status migrainosus: Secondary | ICD-10-CM | POA: Diagnosis not present

## 2023-12-25 DIAGNOSIS — I10 Essential (primary) hypertension: Secondary | ICD-10-CM | POA: Diagnosis not present

## 2023-12-25 DIAGNOSIS — E559 Vitamin D deficiency, unspecified: Secondary | ICD-10-CM | POA: Diagnosis not present

## 2023-12-27 ENCOUNTER — Telehealth: Payer: Self-pay

## 2023-12-27 DIAGNOSIS — Z5181 Encounter for therapeutic drug level monitoring: Secondary | ICD-10-CM

## 2023-12-27 NOTE — Telephone Encounter (Signed)
 Cld Pt regarding labs. No answer, left detailed VM (per dpr) regarding labs needed, left call back # if questions.

## 2023-12-27 NOTE — Telephone Encounter (Signed)
-----   Message from Harlene Bogaert sent at 12/18/2023  9:08 AM EDT ----- Can patients PCP be contacted to see if he is able to obtain lamotrigine  level with routine lab work next week? He can send results to this office to review if needed. If unable to obtain, please let me know and I will place an order to have patient come back to have this completed. Thank you!

## 2023-12-27 NOTE — Telephone Encounter (Signed)
 Called and spoke w/rep at [facility] who stated that he doesn't have appt next week as mccue np thought he is scheduled for feb. So unable to do labs next week

## 2023-12-27 NOTE — Telephone Encounter (Signed)
 Can patient please be advised?  Orders will be placed for lamotrigine  and CMP.  He can come to the office at his convenience next week or the following week to have these completed.  Thank you.

## 2024-01-01 ENCOUNTER — Ambulatory Visit (INDEPENDENT_AMBULATORY_CARE_PROVIDER_SITE_OTHER): Admitting: Student in an Organized Health Care Education/Training Program

## 2024-01-01 DIAGNOSIS — F32A Depression, unspecified: Secondary | ICD-10-CM

## 2024-01-01 DIAGNOSIS — Z636 Dependent relative needing care at home: Secondary | ICD-10-CM

## 2024-01-01 NOTE — Progress Notes (Signed)
 [facility] [address] [place] [address] Dept: [phone] Dept Fax: [phone]  Psychotherapy Progress Note  Patient ID: Matthew [name], male  DOB: 04/16/1956, 68 y.o.  MRN: [id number]  01/01/2024 Start time: 2:58 PM End time: 3:47 PM  Method of Visit: Face-to-Face  Present: patient  Current Concerns:  He reports that he is doing good overall.  He reports that things with his mother continue to be a significant stressor.  He reports that his sister has stopped helping as much and only pays bills at this point.  He reports that the home aid that they had hired got into nursing school and so are now using a service and it has so far not been as good.  He reports that he did get his mother a new doctor who is a Ship broker.  He reports that she has been doing a good job ordering tests, cleaning up her prescriptions, and taking good care of her.  He does report concern that his mother may have depression and will discuss this when he next takes her.  He reports that he has been making sure to put himself first when he needs to.  He reports that instead of being overwhelmed he can take a step back and do stuff for himself.  He reports this has been a big improvement.  Prior to leaving the appointment he confirmed he was in a stable and safe mindset.  He reports no SI, HI, or AVH.   Current Symptoms: Family Stress    Psychiatric Specialty Exam: General Appearance: Casual and Fairly Groomed  Eye Contact:  Good  Speech:  Clear and Coherent and Normal Rate  Volume:  Normal  Mood:  Dysphoric   Affect:  Congruent  Thought Process:  Coherent and Goal Directed  Orientation:  Full (Time, Place, and Person)  Thought Content:  WDL and Logical  Suicidal Thoughts:  No  Homicidal Thoughts:  No  Memory:  Immediate;   Good Recent;   Good  Judgement:  Good  Insight:  Good  Psychomotor Activity:  Normal   Concentration:  Concentration: Good and Attention Span: Good  Recall:  Good  Fund of Knowledge:Good  Language: Good  Akathisia:  Negative  Handed:  Right  AIMS (if indicated):  not done  Assets:  Communication Skills Desire for Improvement Housing Resilience Talents/Skills  ADL's:  Intact  Cognition: WNL  Sleep:  Good     Diagnosis: Depression, Grief   Anticipated Frequency of Visits: weekly Anticipated Length of Treatment Episode: 16  Short Term Goals/Goals for Treatment Session: Continue taking care of himself  Progress Towards Goals: Progressing as evidenced by him doing more for himself  Treatment Intervention: Supportive therapy  Medical Necessity: Assisted patient to achieve or maintain maximum functional capacity  Assessment Tools:     No data to display         Failed to redirect to the Timeline version of the REVFS SmartLink. Flowsheet Row ED from 07/28/2022 in [facility] Emergency Department at [facility] ED to Hosp-Admission (Discharged) from 03/03/2022 in [facility]M [facility] ED from 11/19/2021 in [facility] Emergency Department at [facility]  C-SSRS RISK CATEGORY No Risk No Risk No Risk    Collaboration of Care:   Patient/Guardian was advised Release of Information must be obtained prior to any record release in order to collaborate their care with an outside provider. Patient/Guardian was advised if they  have not already done so to contact the registration department to sign all necessary forms in order for us  to release information regarding their care.   Consent: Patient/Guardian gives verbal consent for treatment and assignment of benefits for services provided during this visit. Patient/Guardian expressed understanding and agreed to proceed.   Plan: Provided talk/supportive therapy.  His mother continues to be a big stressor but is handling it better.  He will continue ensuring he makes time for himself.  Prior to  leaving the appointment he confirmed he was in a stable and safe mindset.  He reports no SI, HI, or AVH.  [name], DO 01/01/2024

## 2024-01-29 ENCOUNTER — Encounter (HOSPITAL_COMMUNITY): Payer: Self-pay | Admitting: Student in an Organized Health Care Education/Training Program

## 2024-01-29 ENCOUNTER — Ambulatory Visit (INDEPENDENT_AMBULATORY_CARE_PROVIDER_SITE_OTHER): Admitting: Student in an Organized Health Care Education/Training Program

## 2024-01-29 DIAGNOSIS — F32A Depression, unspecified: Secondary | ICD-10-CM | POA: Diagnosis not present

## 2024-01-29 DIAGNOSIS — Z636 Dependent relative needing care at home: Secondary | ICD-10-CM

## 2024-01-29 NOTE — Progress Notes (Signed)
 [facility] [address] [place] [address] [place] [address] Dept: [phone] Dept Fax: [phone]  Psychotherapy Progress Note  Patient ID: Matthew [name], male  DOB: 01/03/1956, 68 y.o.  MRN: [id number]  01/29/2024 Start time: 1:30 PM End time: 2:16 PM  Method of Visit: Face-to-Face  Present: patient  Current Concerns:  He reports things have not gotten better since his last appointment.  He reports his mother continues to be very difficult.  He reports that since our last visit she has run off for nurses aides.  He reports that they are all nice and want to do a good job but she is so mean with her targeted comments that they cannot keep working.  He reports it got to the point where both he and his sister have been pulling back.     Discussed with him about setting boundaries.  Discussed that he should let his mother know if she continues to be mean he will end the phone call and then hold her to that standard.  He reports that he needs to do this otherwise he will continue to decline.  Prior to leaving the appointment he and they confirmed he and they was in a stable and safe mindset.  He and They reports no SI, HI, or AVH.    Current Symptoms: Family Stress    Psychiatric Specialty Exam: General Appearance: Casual and Fairly Groomed  Eye Contact:  Good  Speech:  Clear and Coherent and Normal Rate  Volume:  Normal  Mood:  Anxious and Dysphoric   Affect:  Congruent  Thought Process:  Coherent and Goal Directed  Orientation:  Full (Time, Place, and Person)  Thought Content:  WDL and Logical  Suicidal Thoughts:  No  Homicidal Thoughts:  No  Memory:  Immediate;   Good Recent;   Good  Judgement:  Good  Insight:  Good  Psychomotor Activity:  Normal  Concentration:  Concentration: Good and Attention Span: Good  Recall:  Good  Fund of Knowledge:Good  Language: Good  Akathisia:  Negative  Handed:   Right  AIMS (if indicated):  not done  Assets:  Communication Skills Desire for Improvement Housing Resilience Talents/Skills  ADL's:  Intact  Cognition: WNL  Sleep:  Good     Diagnosis: Depression, Grief   Anticipated Frequency of Visits: weekly Anticipated Length of Treatment Episode: 16  Short Term Goals/Goals for Treatment Session: Establish boundaries with mother Progress Towards Goals: Initial   Treatment Intervention: Supportive therapy  Medical Necessity: Assisted patient to achieve or maintain maximum functional capacity  Assessment Tools:     No data to display         Failed to redirect to the Timeline version of the REVFS SmartLink. Flowsheet Row ED from 07/28/2022 in [facility] Emergency Department at [facility] ED to Hosp-Admission (Discharged) from 03/03/2022 in [facility] 51M KIDNEY UNIT ED from 11/19/2021 in [facility] Emergency Department at [facility]  C-SSRS RISK CATEGORY No Risk No Risk No Risk    Collaboration of Care:   Patient/Guardian was advised Release of Information must be obtained prior to any record release in order to collaborate their care with an outside provider. Patient/Guardian was advised if they have not already done so to contact the registration department to sign all necessary forms in order for us  to release information regarding their care.   Consent: Patient/Guardian gives verbal consent for treatment and assignment of benefits for  services provided during this visit. Patient/Guardian expressed understanding and agreed to proceed.   Plan: Provided talk/supportive therapy.  He has been doing well at doing more things for himself, however, his mother continues to be the main stressor for him.  We discussed the need to establish boundaries with her and he will be working on this.  Prior to leaving the appointment he confirmed he was in a stable and safe mindset.  He reports no SI, HI, or  AVH.   [name], DO 01/29/2024

## 2024-02-26 ENCOUNTER — Ambulatory Visit (INDEPENDENT_AMBULATORY_CARE_PROVIDER_SITE_OTHER): Admitting: Student in an Organized Health Care Education/Training Program

## 2024-02-26 DIAGNOSIS — Z636 Dependent relative needing care at home: Secondary | ICD-10-CM | POA: Diagnosis not present

## 2024-02-26 DIAGNOSIS — F32A Depression, unspecified: Secondary | ICD-10-CM

## 2024-02-26 NOTE — Progress Notes (Signed)
 [facility] [address] [place] [address] [place] [address] Dept: [phone] Dept Fax: [phone]  Psychotherapy Progress Note  Patient ID: Matthew [name], male  DOB: [birth year], 68 y.o.  MRN: [id number]  02/26/2024 Start time: 12:58 PM End time: 1:46 PM  Method of Visit: Face-to-Face  Present: patient  Current Concerns:  He reports continued stress from his mother.  He reports that she has again fired the aid that had been hired to help her.  He reports that she just continues to wear him down.  He reports that he did try to take a couple day break from her but this just ended up causing a bigger issue when he did call her again.  He reports that 1 day Devere went with him to his mothers and he did overhear Devere defending him to his mother.  He reports that this was really reaffirming for him.  Discussed potential ways to mitigate phone calls with her so that they are less taxing on him.  He reports that he had already purchased a phone stand and had been simply turning the phone away when she starts to yell at him.  Brainstormed tasks that he could be doing during the phone calls that would be constructive and positive for him.  Given his love of flyfishing he thinks that tying flies while talking on the phone would be a way for him to see significant positive benefit while talking on the phone with his mother and would allow him to turn the phone away when she begins to say things that he does want to hear.  Prior to leaving the appointment he confirmed he was in a stable and safe mindset.  He reports no SI, HI, or AVH.     Current Symptoms: Family Stress    Psychiatric Specialty Exam: General Appearance: Casual and Fairly Groomed  Eye Contact:  Good  Speech:  Clear and Coherent and Normal Rate  Volume:  Normal  Mood:  Dysphoric and worn down   Affect:  Congruent  Thought Process:  Coherent and Goal  Directed  Orientation:  Full (Time, Place, and Person)  Thought Content:  WDL and Logical  Suicidal Thoughts:  No  Homicidal Thoughts:  No  Memory:  Immediate;   Good Recent;   Good  Judgement:  Good  Insight:  Good  Psychomotor Activity:  Normal  Concentration:  Concentration: Good and Attention Span: Good  Recall:  Good  Fund of Knowledge:Good  Language: Good  Akathisia:  Negative  Handed:  Right  AIMS (if indicated):  not done  Assets:  Communication Skills Desire for Improvement Housing Resilience Social Support Talents/Skills  ADL's:  Intact  Cognition: WNL  Sleep:  Fair     Diagnosis: Depression, Grief   Anticipated Frequency of Visits: weekly Anticipated Length of Treatment Episode: 16  Short Term Goals/Goals for Treatment Session: Establish positive task while talking on the phone Progress Towards Goals: Initial   Treatment Intervention: Supportive therapy  Medical Necessity: Assisted patient to achieve or maintain maximum functional capacity  Assessment Tools:     No data to display         Failed to redirect to the Timeline version of the REVFS SmartLink. Flowsheet Row ED from 07/28/2022 in [facility] Emergency Department at [facility] ED to Hosp-Admission (Discharged) from 03/03/2022 in [facility] 77M KIDNEY UNIT ED from 11/19/2021 in [facility] Emergency Department at [facility]  C-SSRS  RISK CATEGORY No Risk No Risk No Risk    Collaboration of Care:   Patient/Guardian was advised Release of Information must be obtained prior to any record release in order to collaborate their care with an outside provider. Patient/Guardian was advised if they have not already done so to contact the registration department to sign all necessary forms in order for us  to release information regarding their care.   Consent: Patient/Guardian gives verbal consent for treatment and assignment of benefits for services provided during this visit.  Patient/Guardian expressed understanding and agreed to proceed.   Plan: Provided talk/supportive therapy.  His major stressor continues to be his mother and the time he spends on her.  He will begin do fun things (tie flyfishing fly's) while talking on the phone with his mother to make it more positive and less draining to him.  Prior to leaving the appointment he confirmed he was in a stable and safe mindset.  He reports no SI, HI, or AVH.    [name], DO 02/26/2024

## 2024-03-11 ENCOUNTER — Ambulatory Visit (HOSPITAL_COMMUNITY): Admitting: Student in an Organized Health Care Education/Training Program

## 2024-03-11 NOTE — Progress Notes (Unsigned)
  [facility] [address] [place] [address] [place] [address] Dept: [phone] Dept Fax: [phone]  Psychotherapy Progress Note  Patient ID: Matthew [name], male  DOB: 04-22-1956, 68 y.o.  MRN: [id number]  03/11/2024 Start time: 12:58 PM End time: 1:*** PM  Method of Visit: Face-to-Face  Present: patient  Current Concerns:  He reports ***     Current Symptoms: Family Stress   *** Psychiatric Specialty Exam: General Appearance: Casual and Fairly Groomed  Eye Contact:  Good  Speech:  Clear and Coherent and Normal Rate  Volume:  Normal  Mood:  Dysphoric and worn down   Affect:  Congruent  Thought Process:  Coherent and Goal Directed  Orientation:  Full (Time, Place, and Person)  Thought Content:  WDL and Logical  Suicidal Thoughts:  No  Homicidal Thoughts:  No  Memory:  Immediate;   Good Recent;   Good  Judgement:  Good  Insight:  Good  Psychomotor Activity:  Normal  Concentration:  Concentration: Good and Attention Span: Good  Recall:  Good  Fund of Knowledge:Good  Language: Good  Akathisia:  Negative  Handed:  Right  AIMS (if indicated):  not done  Assets:  Communication Skills Desire for Improvement Housing Resilience Social Support Talents/Skills  ADL's:  Intact  Cognition: WNL  Sleep:  Fair     Diagnosis: Depression, Grief   Anticipated Frequency of Visits: weekly Anticipated Length of Treatment Episode: 16  Short Term Goals/Goals for Treatment Session: Establish positive task while talking on the phone Progress Towards Goals: Initial   Treatment Intervention: Supportive therapy  Medical Necessity: Assisted patient to achieve or maintain maximum functional capacity  Assessment Tools:     No data to display         Failed to redirect to the Timeline version of the REVFS SmartLink. Flowsheet Row ED from 07/28/2022 in [facility] Emergency Department at [facility] ED to Hosp-Admission (Discharged) from 03/03/2022 in [facility] 53M KIDNEY UNIT ED from 11/19/2021 in [facility] Emergency Department at [facility]  C-SSRS RISK CATEGORY No Risk No Risk No Risk    Collaboration of Care:   Patient/Guardian was advised Release of Information must be obtained prior to any record release in order to collaborate their care with an outside provider. Patient/Guardian was advised if they have not already done so to contact the registration department to sign all necessary forms in order for us  to release information regarding their care.   Consent: Patient/Guardian gives verbal consent for treatment and assignment of benefits for services provided during this visit. Patient/Guardian expressed understanding and agreed to proceed.   Plan: Provided talk/supportive therapy.  His major stressor ***    [name], DO 03/11/2024

## 2024-05-20 ENCOUNTER — Ambulatory Visit (INDEPENDENT_AMBULATORY_CARE_PROVIDER_SITE_OTHER): Admitting: Student in an Organized Health Care Education/Training Program

## 2024-05-20 DIAGNOSIS — F4321 Adjustment disorder with depressed mood: Secondary | ICD-10-CM | POA: Diagnosis not present

## 2024-05-20 NOTE — Progress Notes (Signed)
 " [facility] [address] [place] [address] [place] [address] Dept: [phone] Dept Fax: [phone]  Psychotherapy Progress Note  Patient ID: Matthew [name], male  DOB: 08/07/1955, 69 y.o.  MRN: [id number]  05/20/2024 Start time: 1:55 PM End time: 2:42 PM  Method of Visit: Face-to-Face  Present: patient  Current Concerns:  He reports things have been difficult since his last appointment.  He reports that shortly after his last appointment his mother fell and broke her hip and femur.  He reports that she then had surgery which went okay but then she needed to go into rehab.  He reports that she then had a clot develop in her lung.  He reports that this was then dealt with but by that point she had developed pneumonia and she eventually went into hospice and passed away the day after Christmas.  He reports that things have been difficult but he has been able to handle them.  He reports there has been a little bit of friction between him and his sister about getting their mom's apartment ready to sell but discussed strategies to mitigate things.  Prior to leaving the appointment he confirmed he was in a stable and safe mindset.  He reports no SI, HI, or AVH.      Current Symptoms: Family Stress    Psychiatric Specialty Exam: General Appearance: Casual and Fairly Groomed  Eye Contact:  Good  Speech:  Clear and Coherent and Normal Rate  Volume:  Normal  Mood:  Dysphoric   Affect:  Appropriate and Congruent  Thought Process:  Coherent and Goal Directed  Orientation:  Full (Time, Place, and Person)  Thought Content:  WDL and Logical  Suicidal Thoughts:  No  Homicidal Thoughts:  No  Memory:  Immediate;   Good Recent;   Good  Judgement:  Good  Insight:  Good  Psychomotor Activity:  Normal  Concentration:  Concentration: Good and Attention Span: Good  Recall:  Good  Fund of Knowledge:Good  Language: Good   Akathisia:  Negative  Handed:  Right  AIMS (if indicated):  not done  Assets:  Communication Skills Desire for Improvement Housing Resilience Social Support Talents/Skills  ADL's:  Intact  Cognition: WNL  Sleep:  Fair     Diagnosis: Depression, Grief   Anticipated Frequency of Visits: weekly Anticipated Length of Treatment Episode: 16  Short Term Goals/Goals for Treatment Session: Manage grief Progress Towards Goals: Initial   Treatment Intervention: Supportive therapy  Medical Necessity: Assisted patient to achieve or maintain maximum functional capacity  Assessment Tools:     No data to display         Failed to redirect to the Timeline version of the REVFS SmartLink. Flowsheet Row ED from 07/28/2022 in [facility] Emergency Department at [facility] ED to Hosp-Admission (Discharged) from 03/03/2022 in [facility] 44M KIDNEY UNIT ED from 11/19/2021 in [facility] Emergency Department at [facility]  C-SSRS RISK CATEGORY No Risk No Risk No Risk    Collaboration of Care:   Patient/Guardian was advised Release of Information must be obtained prior to any record release in order to collaborate their care with an outside provider. Patient/Guardian was advised if they have not already done so to contact the registration department to sign all necessary forms in order for us  to release information regarding their care.   Consent: Patient/Guardian gives verbal consent for treatment and assignment of benefits for services provided during  this visit. Patient/Guardian expressed understanding and agreed to proceed.   Plan: Provided talk/supportive therapy.  He is processing the grief over the loss of his mother but has been able to do so relatively well.  Provided support and encouragement.  Prior to leaving the appointment he confirmed he was in a stable and safe mindset.  He reports no SI, HI, or AVH.     [name], DO 05/20/2024           "

## 2024-07-01 ENCOUNTER — Ambulatory Visit (HOSPITAL_COMMUNITY): Admitting: Student in an Organized Health Care Education/Training Program

## 2024-12-23 ENCOUNTER — Ambulatory Visit: Admitting: Adult Health
